# Patient Record
Sex: Male | Born: 1935 | Race: White | Hispanic: No | Marital: Married | Smoking: Former smoker
Health system: Southern US, Community
[De-identification: ages and names within clinical notes are randomized; demographics above are authoritative.]

## PROBLEM LIST (undated history)

## (undated) DIAGNOSIS — J42 Unspecified chronic bronchitis: Secondary | ICD-10-CM

## (undated) DIAGNOSIS — Z9989 Dependence on other enabling machines and devices: Secondary | ICD-10-CM

## (undated) DIAGNOSIS — E039 Hypothyroidism, unspecified: Secondary | ICD-10-CM

## (undated) DIAGNOSIS — I219 Acute myocardial infarction, unspecified: Secondary | ICD-10-CM

## (undated) DIAGNOSIS — R001 Bradycardia, unspecified: Secondary | ICD-10-CM

## (undated) DIAGNOSIS — I5032 Chronic diastolic (congestive) heart failure: Secondary | ICD-10-CM

## (undated) DIAGNOSIS — M1990 Unspecified osteoarthritis, unspecified site: Secondary | ICD-10-CM

## (undated) DIAGNOSIS — I10 Essential (primary) hypertension: Secondary | ICD-10-CM

## (undated) DIAGNOSIS — J189 Pneumonia, unspecified organism: Secondary | ICD-10-CM

## (undated) DIAGNOSIS — N200 Calculus of kidney: Secondary | ICD-10-CM

## (undated) DIAGNOSIS — J449 Chronic obstructive pulmonary disease, unspecified: Secondary | ICD-10-CM

## (undated) DIAGNOSIS — E119 Type 2 diabetes mellitus without complications: Secondary | ICD-10-CM

## (undated) DIAGNOSIS — E669 Obesity, unspecified: Secondary | ICD-10-CM

## (undated) DIAGNOSIS — I2510 Atherosclerotic heart disease of native coronary artery without angina pectoris: Secondary | ICD-10-CM

## (undated) DIAGNOSIS — K219 Gastro-esophageal reflux disease without esophagitis: Secondary | ICD-10-CM

## (undated) DIAGNOSIS — M109 Gout, unspecified: Secondary | ICD-10-CM

## (undated) DIAGNOSIS — G4733 Obstructive sleep apnea (adult) (pediatric): Secondary | ICD-10-CM

## (undated) DIAGNOSIS — N183 Chronic kidney disease, stage 3 unspecified: Secondary | ICD-10-CM

## (undated) DIAGNOSIS — I739 Peripheral vascular disease, unspecified: Secondary | ICD-10-CM

## (undated) DIAGNOSIS — E785 Hyperlipidemia, unspecified: Secondary | ICD-10-CM

## (undated) HISTORY — DX: Peripheral vascular disease, unspecified: I73.9

## (undated) HISTORY — PX: TONSILLECTOMY: SUR1361

## (undated) HISTORY — PX: CARDIAC CATHETERIZATION: SHX172

## (undated) HISTORY — PX: ANGIOPLASTY: SHX39

## (undated) HISTORY — PX: CORONARY ANGIOPLASTY WITH STENT PLACEMENT: SHX49

## (undated) HISTORY — PX: FRACTURE SURGERY: SHX138

## (undated) HISTORY — DX: Type 2 diabetes mellitus without complications: E11.9

## (undated) HISTORY — PX: OTHER SURGICAL HISTORY: SHX169

## (undated) HISTORY — PX: TIBIA FRACTURE SURGERY: SHX806

## (undated) HISTORY — PX: BACK SURGERY: SHX140

## (undated) HISTORY — PX: CHOLECYSTECTOMY: SHX55

## (undated) HISTORY — PX: CATARACT EXTRACTION W/ INTRAOCULAR LENS  IMPLANT, BILATERAL: SHX1307

## (undated) HISTORY — DX: Obesity, unspecified: E66.9

## (undated) HISTORY — PX: LITHOTRIPSY: SUR834

## (undated) HISTORY — DX: Acute myocardial infarction, unspecified: I21.9

---

## 1988-10-28 HISTORY — PX: KNEE ARTHROSCOPY: SUR90

## 1996-04-27 HISTORY — PX: CORONARY ARTERY BYPASS GRAFT: SHX141

## 1997-11-18 ENCOUNTER — Observation Stay (HOSPITAL_COMMUNITY): Admission: AD | Admit: 1997-11-18 | Discharge: 1997-11-19 | Payer: Self-pay | Admitting: Cardiovascular Disease

## 1998-02-27 HISTORY — PX: REPLANTATION FINGER: SUR1229

## 1998-05-01 ENCOUNTER — Inpatient Hospital Stay (HOSPITAL_COMMUNITY): Admission: EM | Admit: 1998-05-01 | Discharge: 1998-05-04 | Payer: Self-pay | Admitting: *Deleted

## 1998-12-16 ENCOUNTER — Emergency Department (HOSPITAL_COMMUNITY): Admission: EM | Admit: 1998-12-16 | Discharge: 1998-12-16 | Payer: Self-pay | Admitting: *Deleted

## 1998-12-20 ENCOUNTER — Inpatient Hospital Stay (HOSPITAL_COMMUNITY): Admission: AD | Admit: 1998-12-20 | Discharge: 1998-12-23 | Payer: Self-pay | Admitting: Cardiovascular Disease

## 1998-12-21 ENCOUNTER — Encounter: Payer: Self-pay | Admitting: Cardiovascular Disease

## 1999-09-01 ENCOUNTER — Encounter: Payer: Self-pay | Admitting: Urology

## 1999-09-01 ENCOUNTER — Encounter: Admission: RE | Admit: 1999-09-01 | Discharge: 1999-09-01 | Payer: Self-pay | Admitting: Urology

## 1999-09-27 ENCOUNTER — Encounter: Payer: Self-pay | Admitting: Urology

## 1999-09-29 ENCOUNTER — Ambulatory Visit (HOSPITAL_COMMUNITY): Admission: RE | Admit: 1999-09-29 | Discharge: 1999-09-29 | Payer: Self-pay | Admitting: Urology

## 1999-09-29 ENCOUNTER — Encounter: Payer: Self-pay | Admitting: Urology

## 1999-10-18 ENCOUNTER — Encounter: Admission: RE | Admit: 1999-10-18 | Discharge: 1999-10-18 | Payer: Self-pay | Admitting: Urology

## 1999-10-18 ENCOUNTER — Encounter: Payer: Self-pay | Admitting: Urology

## 1999-10-19 ENCOUNTER — Encounter: Payer: Self-pay | Admitting: Urology

## 1999-10-19 ENCOUNTER — Encounter: Admission: RE | Admit: 1999-10-19 | Discharge: 1999-10-19 | Payer: Self-pay | Admitting: Urology

## 1999-11-09 ENCOUNTER — Encounter: Admission: RE | Admit: 1999-11-09 | Discharge: 1999-11-09 | Payer: Self-pay | Admitting: Urology

## 1999-11-09 ENCOUNTER — Encounter: Payer: Self-pay | Admitting: Urology

## 2000-02-15 ENCOUNTER — Encounter: Payer: Self-pay | Admitting: Urology

## 2000-02-15 ENCOUNTER — Encounter: Admission: RE | Admit: 2000-02-15 | Discharge: 2000-02-15 | Payer: Self-pay | Admitting: Urology

## 2002-04-04 ENCOUNTER — Encounter: Payer: Self-pay | Admitting: Emergency Medicine

## 2002-04-04 ENCOUNTER — Emergency Department (HOSPITAL_COMMUNITY): Admission: EM | Admit: 2002-04-04 | Discharge: 2002-04-05 | Payer: Self-pay | Admitting: Emergency Medicine

## 2002-05-02 ENCOUNTER — Ambulatory Visit (HOSPITAL_COMMUNITY): Admission: RE | Admit: 2002-05-02 | Discharge: 2002-05-02 | Payer: Self-pay | Admitting: Ophthalmology

## 2002-05-28 ENCOUNTER — Inpatient Hospital Stay (HOSPITAL_COMMUNITY): Admission: AD | Admit: 2002-05-28 | Discharge: 2002-06-05 | Payer: Self-pay | Admitting: Cardiovascular Disease

## 2003-07-03 ENCOUNTER — Emergency Department (HOSPITAL_COMMUNITY): Admission: EM | Admit: 2003-07-03 | Discharge: 2003-07-03 | Payer: Self-pay | Admitting: Emergency Medicine

## 2003-10-08 ENCOUNTER — Ambulatory Visit (HOSPITAL_COMMUNITY): Admission: RE | Admit: 2003-10-08 | Discharge: 2003-10-08 | Payer: Self-pay | Admitting: Pulmonary Disease

## 2003-10-27 ENCOUNTER — Inpatient Hospital Stay (HOSPITAL_COMMUNITY): Admission: RE | Admit: 2003-10-27 | Discharge: 2003-11-03 | Payer: Self-pay | Admitting: Thoracic Surgery

## 2003-10-27 ENCOUNTER — Encounter (INDEPENDENT_AMBULATORY_CARE_PROVIDER_SITE_OTHER): Payer: Self-pay | Admitting: *Deleted

## 2003-11-18 ENCOUNTER — Encounter: Admission: RE | Admit: 2003-11-18 | Discharge: 2003-11-18 | Payer: Self-pay | Admitting: Thoracic Surgery

## 2003-12-09 ENCOUNTER — Encounter: Admission: RE | Admit: 2003-12-09 | Discharge: 2003-12-09 | Payer: Self-pay | Admitting: Thoracic Surgery

## 2004-01-10 ENCOUNTER — Emergency Department (HOSPITAL_COMMUNITY): Admission: EM | Admit: 2004-01-10 | Discharge: 2004-01-10 | Payer: Self-pay

## 2004-01-26 ENCOUNTER — Encounter: Admission: RE | Admit: 2004-01-26 | Discharge: 2004-01-26 | Payer: Self-pay | Admitting: Thoracic Surgery

## 2004-03-04 ENCOUNTER — Emergency Department (HOSPITAL_COMMUNITY): Admission: EM | Admit: 2004-03-04 | Discharge: 2004-03-04 | Payer: Self-pay | Admitting: Emergency Medicine

## 2004-03-06 ENCOUNTER — Emergency Department (HOSPITAL_COMMUNITY): Admission: EM | Admit: 2004-03-06 | Discharge: 2004-03-06 | Payer: Self-pay | Admitting: Emergency Medicine

## 2004-03-07 ENCOUNTER — Ambulatory Visit (HOSPITAL_COMMUNITY): Admission: RE | Admit: 2004-03-07 | Discharge: 2004-03-07 | Payer: Self-pay | Admitting: Pulmonary Disease

## 2004-04-05 ENCOUNTER — Ambulatory Visit (HOSPITAL_COMMUNITY): Admission: RE | Admit: 2004-04-05 | Discharge: 2004-04-05 | Payer: Self-pay | Admitting: Pulmonary Disease

## 2004-04-18 ENCOUNTER — Ambulatory Visit (HOSPITAL_COMMUNITY): Admission: RE | Admit: 2004-04-18 | Discharge: 2004-04-18 | Payer: Self-pay | Admitting: Pulmonary Disease

## 2004-04-27 ENCOUNTER — Encounter: Admission: RE | Admit: 2004-04-27 | Discharge: 2004-04-27 | Payer: Self-pay | Admitting: Thoracic Surgery

## 2004-05-16 ENCOUNTER — Inpatient Hospital Stay (HOSPITAL_COMMUNITY): Admission: EM | Admit: 2004-05-16 | Discharge: 2004-05-19 | Payer: Self-pay | Admitting: *Deleted

## 2004-06-13 ENCOUNTER — Ambulatory Visit (HOSPITAL_COMMUNITY): Admission: RE | Admit: 2004-06-13 | Discharge: 2004-06-13 | Payer: Self-pay

## 2004-06-23 ENCOUNTER — Inpatient Hospital Stay (HOSPITAL_COMMUNITY): Admission: EM | Admit: 2004-06-23 | Discharge: 2004-06-25 | Payer: Self-pay | Admitting: Emergency Medicine

## 2004-07-15 ENCOUNTER — Ambulatory Visit (HOSPITAL_COMMUNITY): Admission: RE | Admit: 2004-07-15 | Discharge: 2004-07-15 | Payer: Self-pay | Admitting: Pulmonary Disease

## 2004-08-09 ENCOUNTER — Ambulatory Visit: Payer: Self-pay | Admitting: Internal Medicine

## 2004-08-09 ENCOUNTER — Ambulatory Visit (HOSPITAL_COMMUNITY): Admission: RE | Admit: 2004-08-09 | Discharge: 2004-08-09 | Payer: Self-pay | Admitting: Internal Medicine

## 2004-08-09 HISTORY — PX: COLONOSCOPY: SHX174

## 2004-09-16 ENCOUNTER — Ambulatory Visit (HOSPITAL_COMMUNITY): Admission: RE | Admit: 2004-09-16 | Discharge: 2004-09-16 | Payer: Self-pay | Admitting: Pulmonary Disease

## 2004-09-30 ENCOUNTER — Observation Stay (HOSPITAL_COMMUNITY): Admission: RE | Admit: 2004-09-30 | Discharge: 2004-10-01 | Payer: Self-pay | Admitting: General Surgery

## 2004-09-30 ENCOUNTER — Encounter (INDEPENDENT_AMBULATORY_CARE_PROVIDER_SITE_OTHER): Payer: Self-pay | Admitting: General Surgery

## 2005-05-10 ENCOUNTER — Ambulatory Visit (HOSPITAL_COMMUNITY): Admission: RE | Admit: 2005-05-10 | Discharge: 2005-05-10 | Payer: Self-pay | Admitting: Pulmonary Disease

## 2006-05-07 ENCOUNTER — Ambulatory Visit (HOSPITAL_COMMUNITY): Admission: RE | Admit: 2006-05-07 | Discharge: 2006-05-07 | Payer: Self-pay | Admitting: *Deleted

## 2006-05-29 ENCOUNTER — Ambulatory Visit (HOSPITAL_COMMUNITY): Admission: RE | Admit: 2006-05-29 | Discharge: 2006-05-30 | Payer: Self-pay | Admitting: Cardiovascular Disease

## 2006-11-19 ENCOUNTER — Ambulatory Visit (HOSPITAL_COMMUNITY): Admission: RE | Admit: 2006-11-19 | Discharge: 2006-11-19 | Payer: Self-pay | Admitting: Pulmonary Disease

## 2007-03-22 ENCOUNTER — Encounter: Admission: RE | Admit: 2007-03-22 | Discharge: 2007-03-22 | Payer: Self-pay | Admitting: Surgery

## 2007-04-11 ENCOUNTER — Inpatient Hospital Stay (HOSPITAL_COMMUNITY): Admission: EM | Admit: 2007-04-11 | Discharge: 2007-04-13 | Payer: Self-pay | Admitting: Emergency Medicine

## 2007-06-06 ENCOUNTER — Ambulatory Visit (HOSPITAL_COMMUNITY): Admission: RE | Admit: 2007-06-06 | Discharge: 2007-06-06 | Payer: Self-pay | Admitting: Pulmonary Disease

## 2007-06-07 ENCOUNTER — Inpatient Hospital Stay (HOSPITAL_COMMUNITY): Admission: EM | Admit: 2007-06-07 | Discharge: 2007-06-19 | Payer: Self-pay | Admitting: Emergency Medicine

## 2007-06-10 ENCOUNTER — Ambulatory Visit: Payer: Self-pay | Admitting: Vascular Surgery

## 2007-06-10 ENCOUNTER — Encounter (INDEPENDENT_AMBULATORY_CARE_PROVIDER_SITE_OTHER): Payer: Self-pay | Admitting: *Deleted

## 2007-06-12 ENCOUNTER — Encounter: Payer: Self-pay | Admitting: *Deleted

## 2008-11-30 ENCOUNTER — Ambulatory Visit (HOSPITAL_COMMUNITY): Admission: RE | Admit: 2008-11-30 | Discharge: 2008-11-30 | Payer: Self-pay | Admitting: Pulmonary Disease

## 2009-01-22 ENCOUNTER — Emergency Department (HOSPITAL_COMMUNITY): Admission: EM | Admit: 2009-01-22 | Discharge: 2009-01-22 | Payer: Self-pay | Admitting: Emergency Medicine

## 2009-02-12 IMAGING — CR DG CHEST 2V
2 series · 2 of 2 positions shown · non-contrast
Comparison: 06/10/2007 study

CLINICAL DATA: History of congestive heart failure.  Dyspnea.
Coughing.

CHEST - 2 VIEW

[w chest pa]
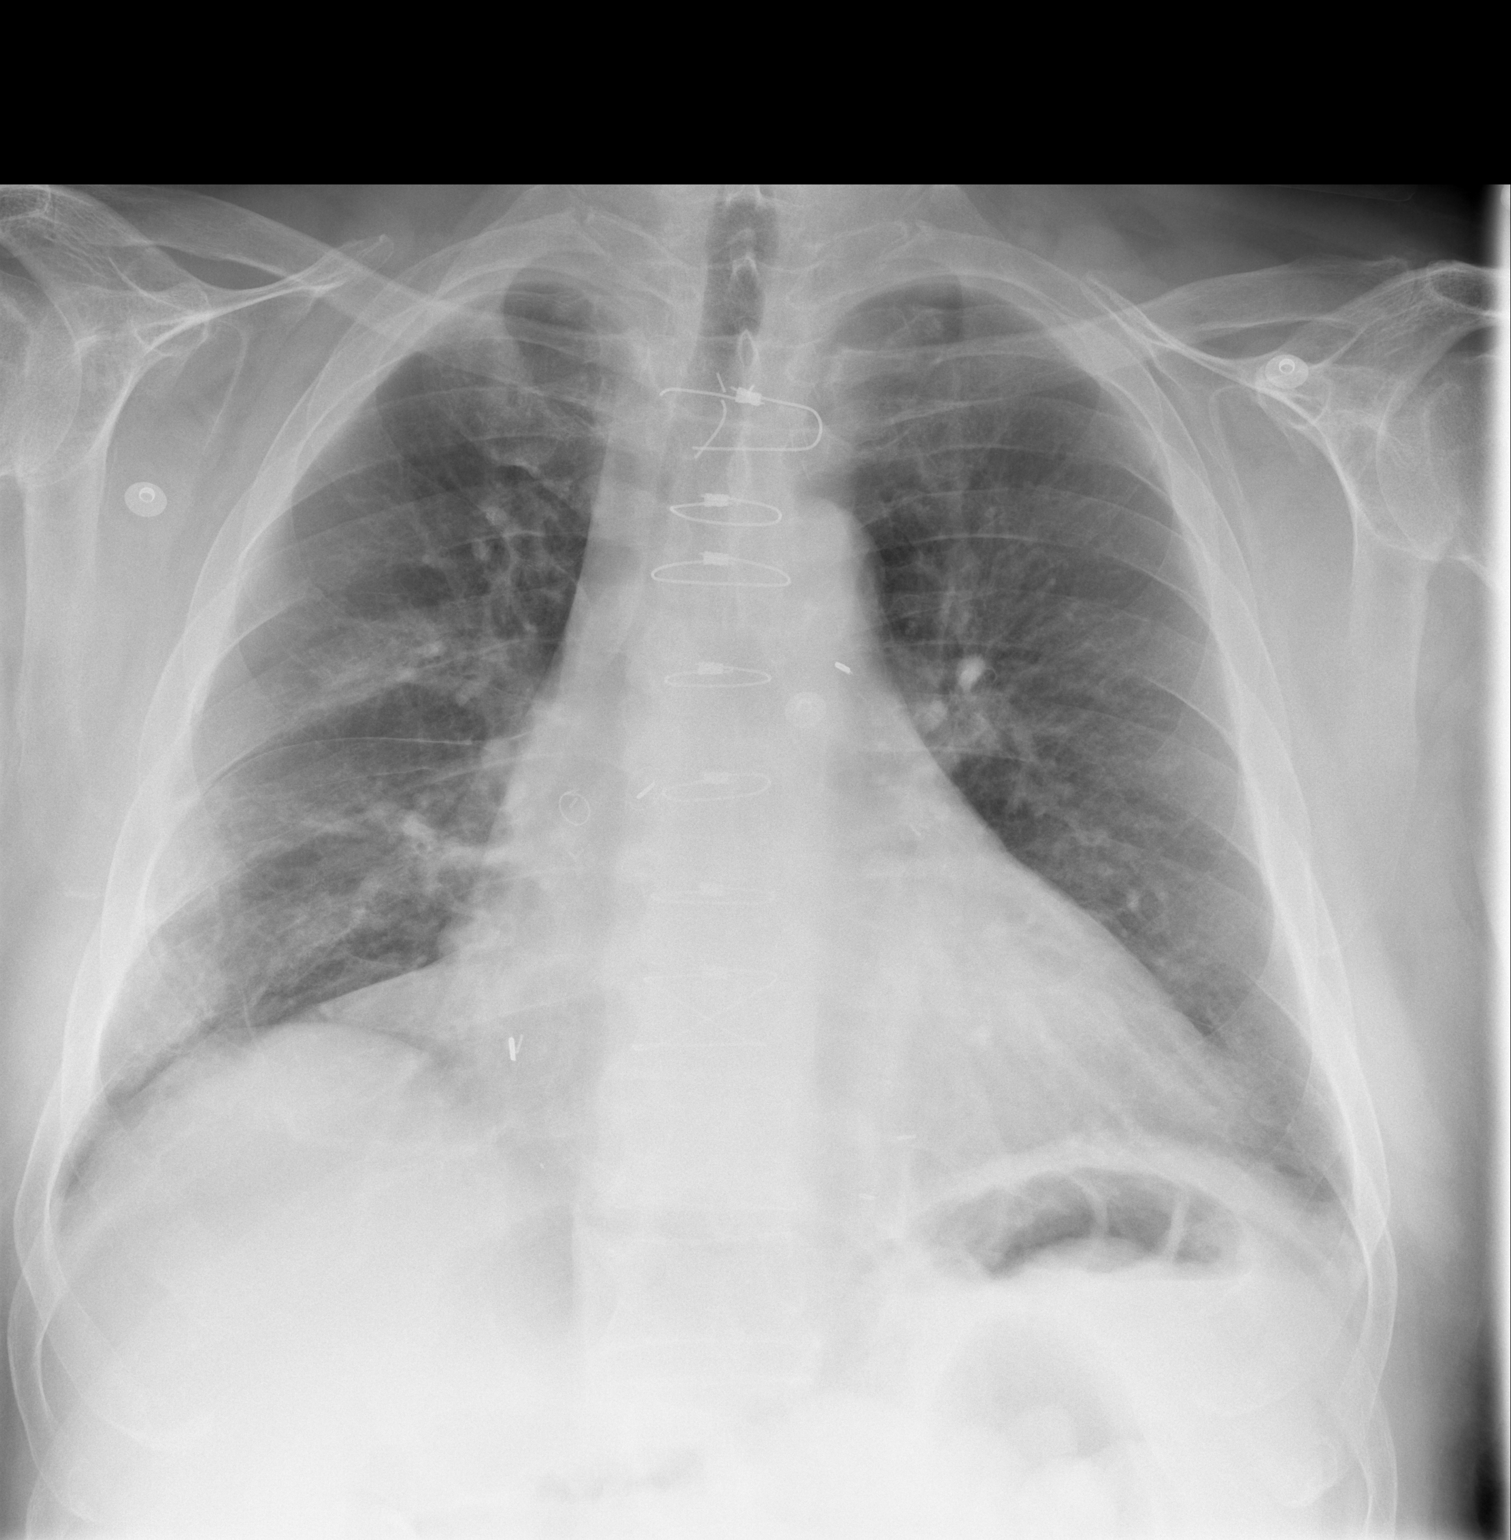

[w chest lat]
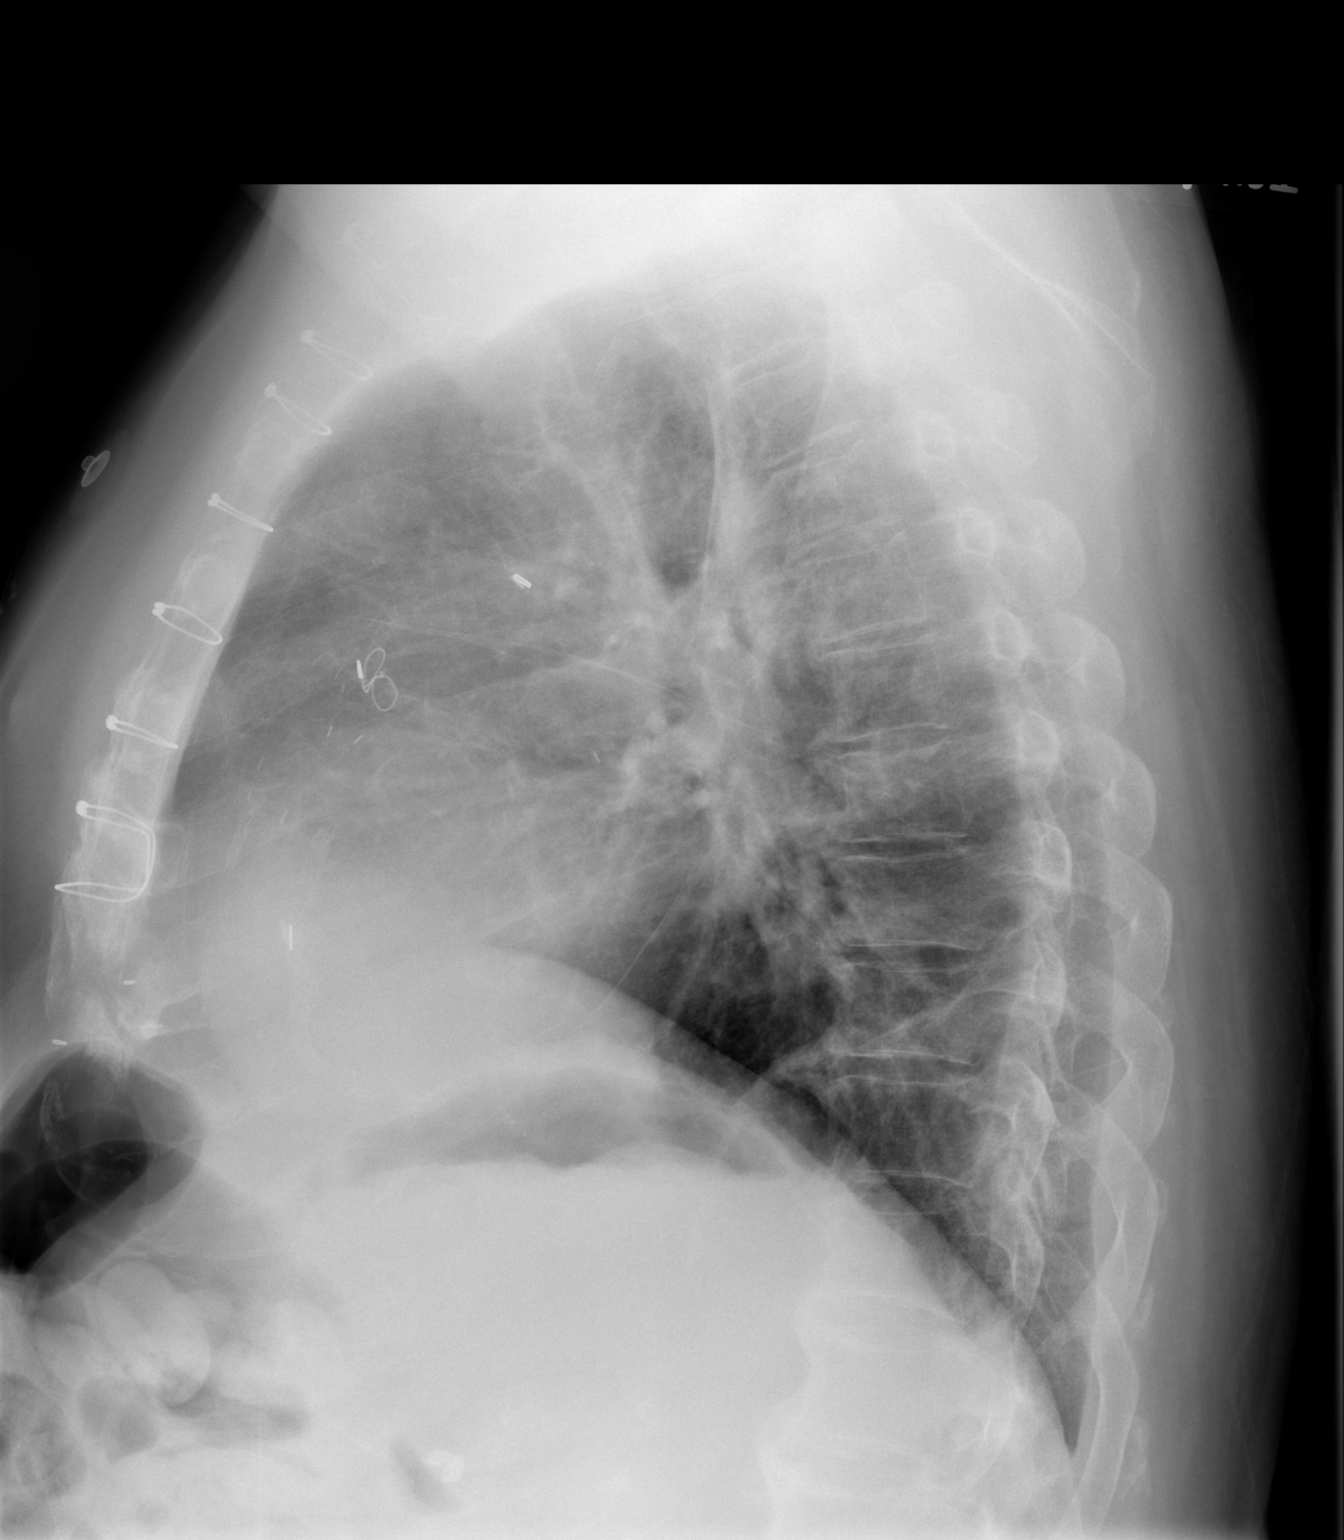

[2 of 2 positions shown; findings below may reference images not displayed]

FINDINGS: Previous median sternotomy coronary artery bypass
grafting has been performed.  There is stable moderate enlargement
of the cardiac silhouette.  No pleural effusions are seen.  The
left lung is free of infiltrates.  Patchy infiltrative density
within the right midlung and right perihilar region appears
unchanged from previous studies.  These opacities have been present
at least since the study of 04/11/2007 and are felt to represent
probable fibrosis at this time.  No new lesion is evident.  There
is degenerative spondylosis average for age.
IMPRESSION: Stable moderate enlargement of the cardiac silhouette with no
pulmonary edema or pleural effusion.  Hazy opacities in the right
hemi thorax are stable over multiple examinations and most likely
reflect fibrosis. No acute superimposed process is seen.

## 2009-02-14 IMAGING — CR DG CERVICAL SPINE 2 OR 3 VIEWS
4 series · 4 of 4 positions shown · non-contrast
Comparison: 05/07/2006

CLINICAL DATA: Chronic neck pain.

CERVICAL SPINE - 2-3 VIEW

[view not recorded (1 of 4)]
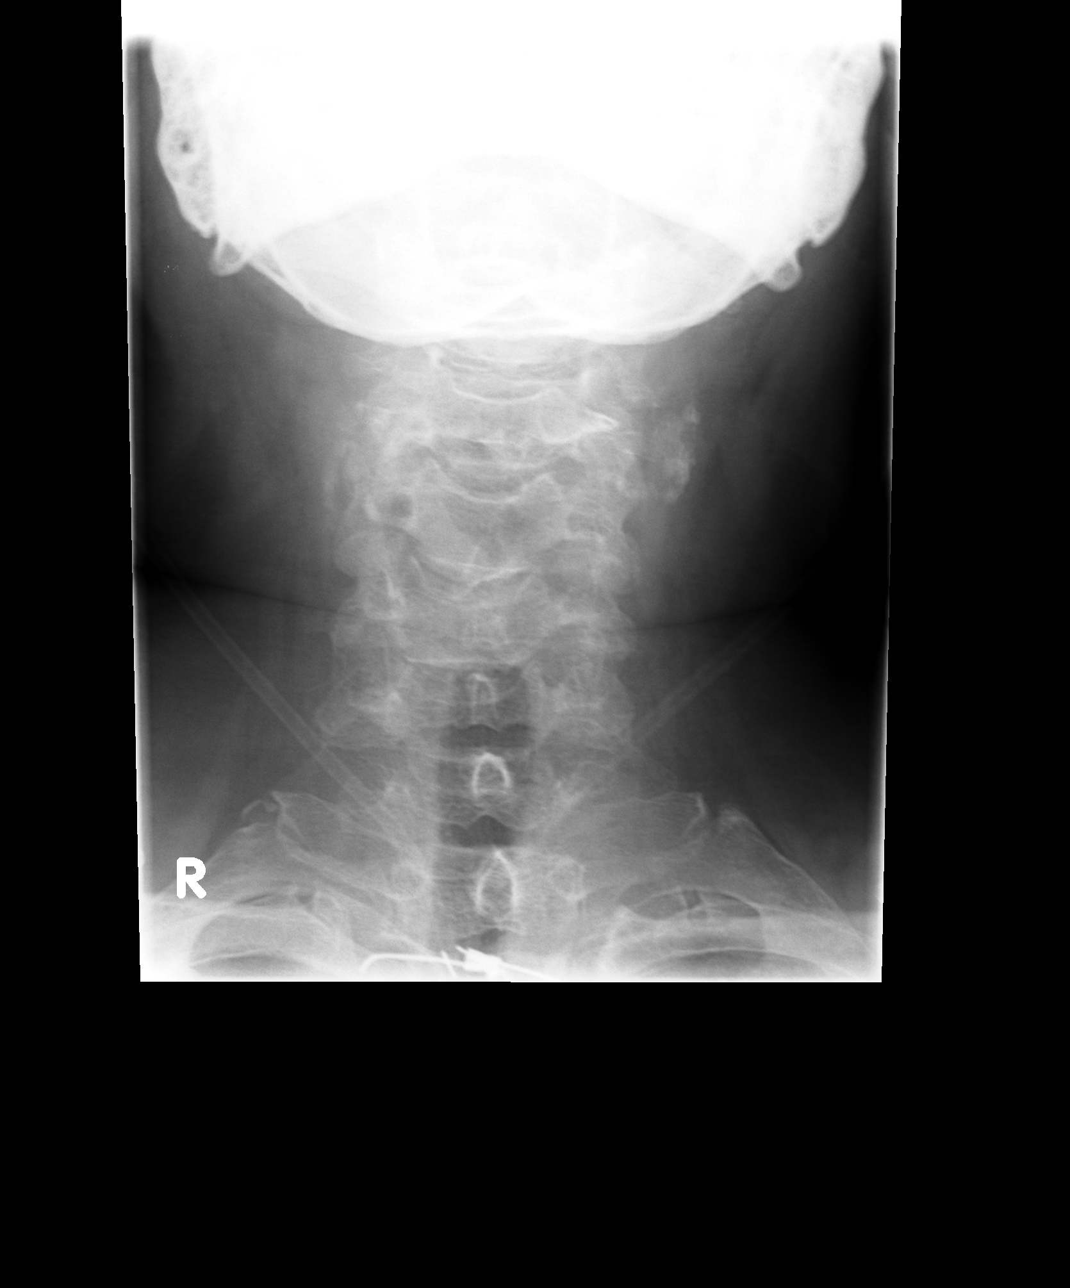

[view not recorded (2 of 4)]
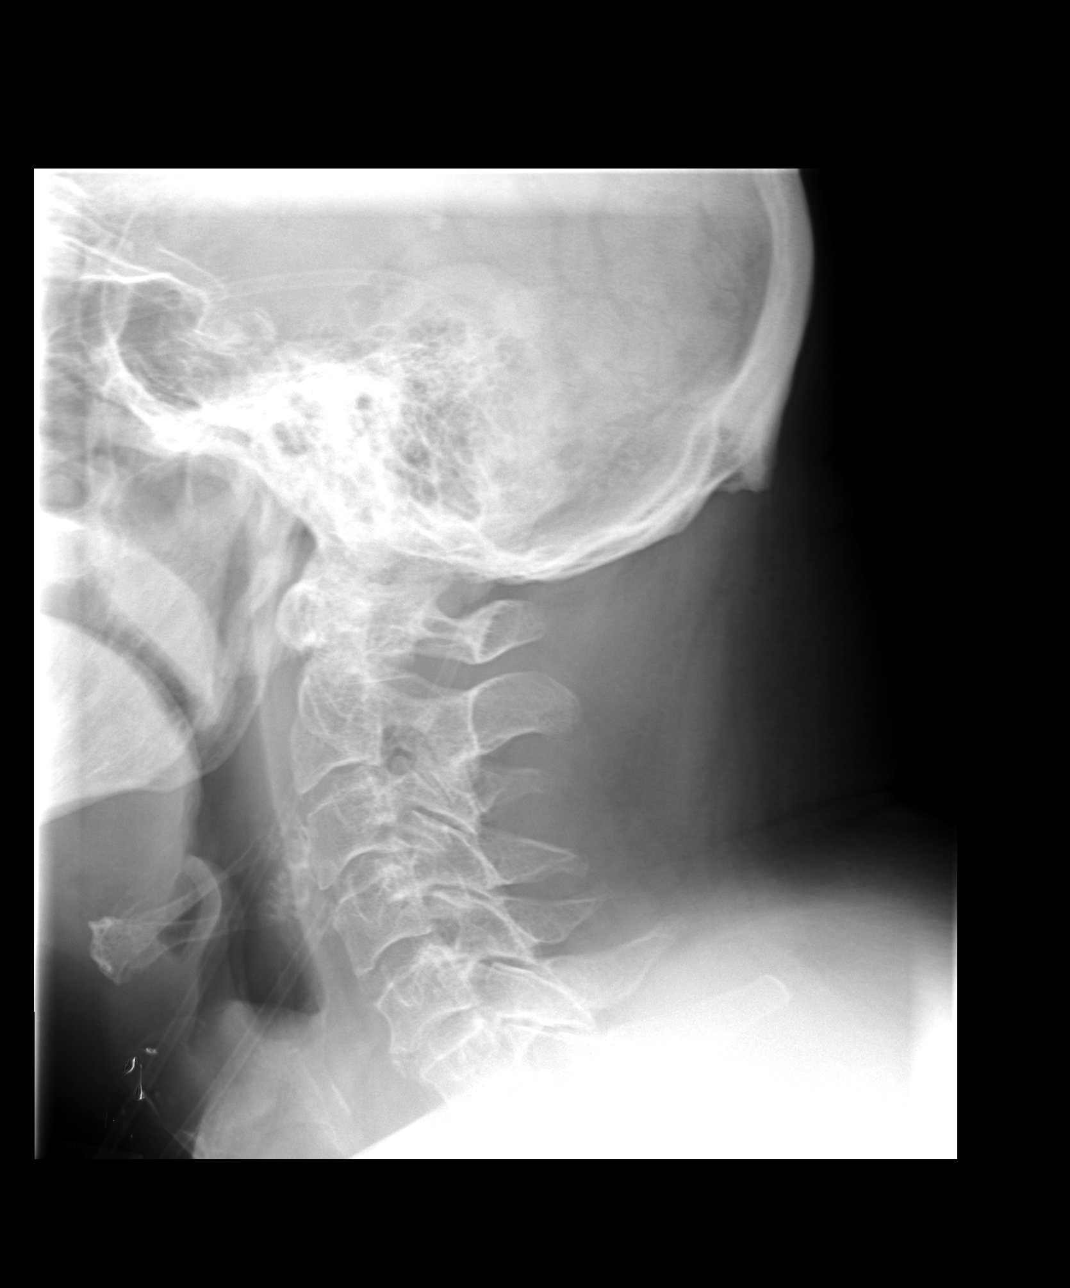

[view not recorded (3 of 4)]
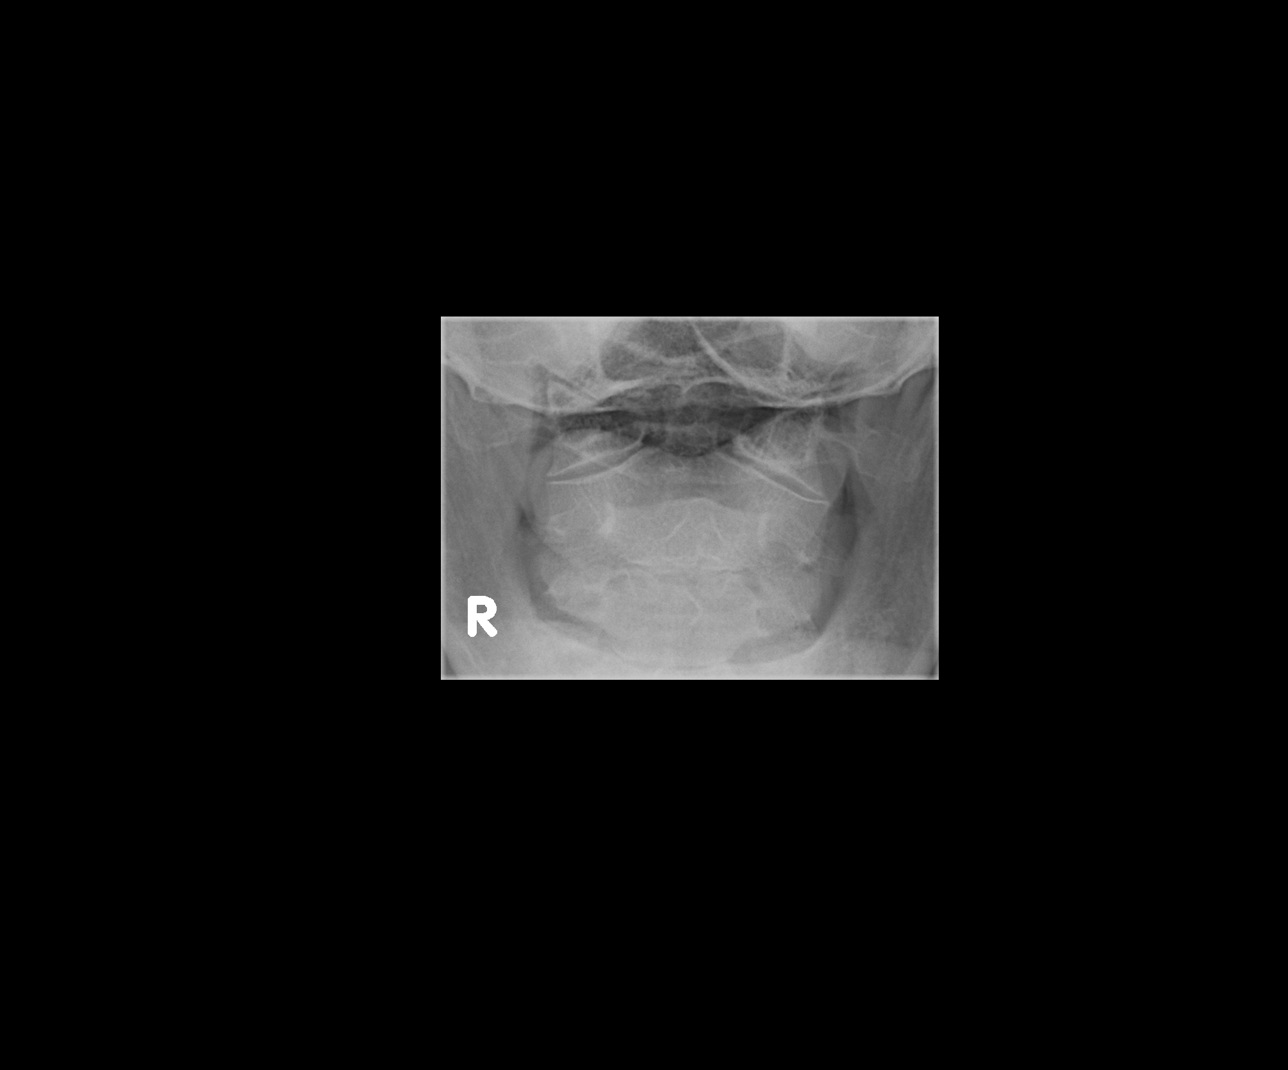

[view not recorded (4 of 4)]
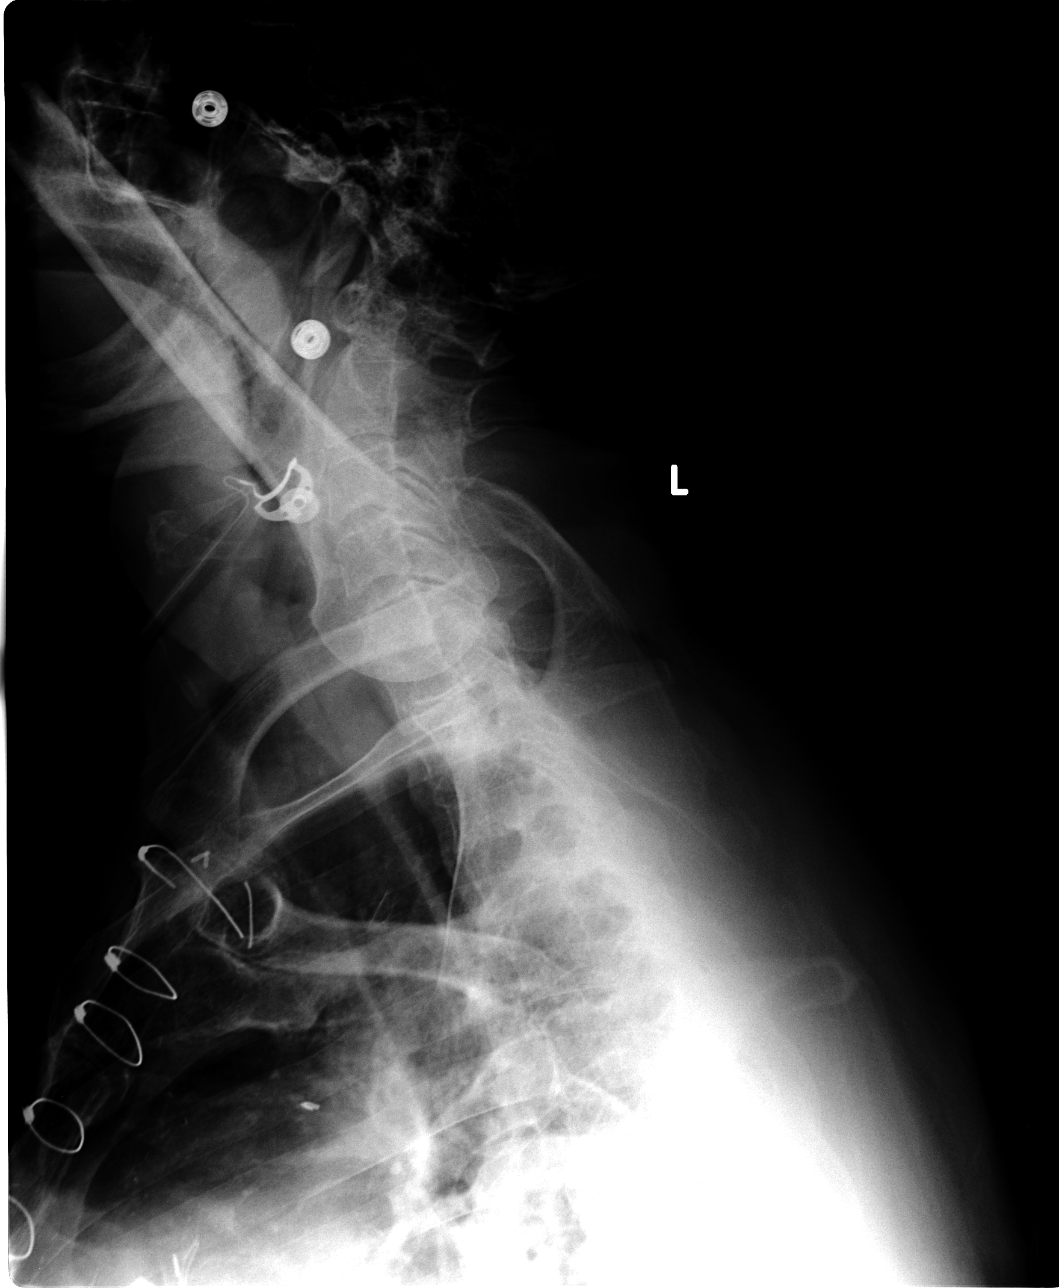

[4 of 4 positions shown; findings below may reference images not displayed]

FINDINGS: C1-T1 are normal in alignment and position.  There is no
significant disc space narrowing.  There is some slight anterior
spurring at the C5-6 level and there is some mild degenerative
facet joint arthritis at C2-3 and C3-4.  There is fairly extensive
calcification in the carotid bifurcations bilaterally.
IMPRESSION: Mild degenerative facet joint arthritis in the cervical spine.
Carotid bifurcation calcifications.

## 2009-02-18 ENCOUNTER — Encounter: Admission: RE | Admit: 2009-02-18 | Discharge: 2009-02-18 | Payer: Self-pay | Admitting: Family Medicine

## 2010-03-20 ENCOUNTER — Encounter: Payer: Self-pay | Admitting: Family Medicine

## 2010-06-01 LAB — DIFFERENTIAL
Basophils Relative: 0 % (ref 0–1)
Eosinophils Absolute: 0.2 10*3/uL (ref 0.0–0.7)
Eosinophils Relative: 1 % (ref 0–5)
Lymphs Abs: 1.1 10*3/uL (ref 0.7–4.0)

## 2010-06-01 LAB — URINALYSIS, ROUTINE W REFLEX MICROSCOPIC
Bilirubin Urine: NEGATIVE
Ketones, ur: NEGATIVE mg/dL
Nitrite: NEGATIVE
Urobilinogen, UA: 0.2 mg/dL (ref 0.0–1.0)

## 2010-06-01 LAB — CBC
HCT: 40.2 % (ref 39.0–52.0)
MCHC: 34.5 g/dL (ref 30.0–36.0)
MCV: 102.7 fL — ABNORMAL HIGH (ref 78.0–100.0)
Platelets: 244 10*3/uL (ref 150–400)
RDW: 13.6 % (ref 11.5–15.5)
WBC: 14.4 10*3/uL — ABNORMAL HIGH (ref 4.0–10.5)

## 2010-06-01 LAB — BASIC METABOLIC PANEL
BUN: 21 mg/dL (ref 6–23)
CO2: 26 mEq/L (ref 19–32)
Chloride: 101 mEq/L (ref 96–112)
Creatinine, Ser: 1.71 mg/dL — ABNORMAL HIGH (ref 0.4–1.5)
Glucose, Bld: 264 mg/dL — ABNORMAL HIGH (ref 70–99)
Potassium: 4.3 mEq/L (ref 3.5–5.1)

## 2010-07-12 NOTE — H&P (Signed)
NAMECOSBY, PROBY               ACCOUNT NO.:  [id number]   MEDICAL RECORD NO.:  [id number]          PATIENT TYPE:  INP   LOCATION:  A218                          FACILITY:  [facility]   PHYSICIAN:  Edward L. Juanetta Gosling, M.D.DATE OF BIRTH:  [birth year]   DATE OF ADMISSION:  06/07/2007  DATE OF DISCHARGE:  LH                              HISTORY & PHYSICAL   Mr. Holtzer is a 77 year old who came to the emergency room  because of  shortness of breath.  He was seen in my office yesterday with confusion.  He has been having this for about 2 weeks and seemed to worsen at night.  When I saw him in my office, his oxygen saturation was in the mid 90s on  room air.  I had him get a chest x-ray that was negative for acute  infiltrate.  I had him do a CT of the brain which is negative and he had  a lot of lab work that had not returned.  He was better, but then last  night developed increasing shortness of breath and cough.  He says he  became so short of breath he felt like he was gasping for breath.  He  took nitroglycerin because he has known coronary artery occlusive  disease and he did not get much relief.  He called EMS who brought into  the hospital room where he was found to have what looks like congestive  heart failure by chest x-ray and BNP criteria.  He has been treated  since and says he feels better.  He actually did not have as much  confusion last night.  He did not have significant chest pain, but he  did have the cough and congestion.   PAST MEDICAL HISTORY:  1. Coronary artery occlusive disease.  2. Congestive heart failure which has been pretty stable.  3. Hypertension.  4. Diabetes.  5. Chronic low back pain.  6. Hyperlipidemia.  7. Hypothyroidism.  8. Multiple areas of peripheral vascular disease.   PAST SURGICAL HISTORY:  1. He has had back surgery.  2. Coronary artery bypass grafting, has had stents.  3. He has had a cholecystectomy.   SOCIAL HISTORY:  He has a remote  smoking history.  Does not use any  alcohol.  Lives at home with his family.   FAMILY HISTORY:  Positive for cardiac disease.  In fact he had a son who  died of a massive myocardial infarction.   ALLERGIES:  He is allergic to DEMEROL.   CURRENT MEDICATIONS:  1. Aspirin 81 mg daily.  2. Furosemide 40 mg daily.  3. Omeprazole 20 mg b.i.d.  4. Allopurinol 1/2 tablet daily.  5. Levothyroxine 150 mcg daily.  6. Metoprolol 25 mg b.i.d.  7. Plavix 75 mg daily.  8. Isosorbide mononitrate 30 mg twice a day.  9. Amlodipine 10 mg daily.  10.Glipizide 10 mg b.i.d.  11.Avandia 8 mg daily.  12.Zocor 80 mg one half daily.  13.Zetia 10 mg daily.  14.Januvia 100 mg 1/2 tablet daily.  15.Renexa 500 mg  b.i.d.   He also has  a history of mild chronic renal failure which I forgot to  mention in his history of present illness.  He also has sleep apnea but  he does not use CPAP most of the time.   PHYSICAL EXAMINATION:  GENERAL:  Obese male who does not appear to be in  any acute distress now.  He has a non-rebreather mask an his oxygen  saturation is 100%.  VITAL SIGNS:  His temperature is 98, pulse 71, respirations 22, blood  pressure 151/80, oxygen saturation 100%.  Blood sugar 174.  HEENT:  His  pupils are reactive.  His nose and throat are clear.  NECK:  Supple.  He does not have jugular venous distention.  CARDIAC:  Now his heart is regular without murmur, gallop or rub.  ABDOMEN:  Soft.  No masses felt.  Bowel sounds present and active.  EXTREMITIES:  Trace edema bilaterally.  CENTRAL NERVOUS SYSTEM:  Examination currently is intact.   LUNGS:  Despite the fact that he was initially had CHF, I did not hear  any rales now.   LABORATORY DATA:  Electrolytes show BUN is 26, creatinine 1.55.  BNP was  548.  Cardiac markers negative for infarction.  His chest x-ray was  consistent with CHF.  White count 10,700, hemoglobin 10.4, platelets  327.   ASSESSMENT:  He has had confusion.  I am not  certain that the congestive  heart failure is the cause of his confusion.  He does have congestive  heart failure now.  He has multiple other medical abnormalities  including diabetes, chronic renal failure and clearly he is going to  have to come off of the Avandia.  He is going to be on sliding scale.  I  am going to ask for cardiology consultation.  He was hospitalized in  February of this year with another bout of what appeared to be acute on  chronic diastolic congestive heart failure and chest pain and was  admitted to [facility] at that time.  I am not sure if he is becoming  hypoxic and developing problems with his confusion or exactly what  happened, but he did not have congestive heart failure on his chest x-  ray that was done yesterday..  Continue most of his other medications.  Have him on intravenous diuretics, sliding scale insulin and follow.      Edward L. Juanetta Gosling, M.D.  Electronically Signed     ELH/MEDQ  D:  06/07/2007  T:  06/07/2007  Job:  [id number]

## 2010-07-12 NOTE — Discharge Summary (Signed)
NAMESHONTE, [name]               ACCOUNT NO.:  [id number]   MEDICAL RECORD NO.:  [id number]          PATIENT TYPE:   LOCATION:                                 FACILITY:   PHYSICIAN:  Nanetta Batty, M.D.   DATE OF BIRTH:  12-09-1933   DATE OF ADMISSION:  04/11/2007  DATE OF DISCHARGE:  04/13/2007                               DISCHARGE SUMMARY   DISCHARGE DIAGNOSES:  1. Chest pain, unstable angina, resolved.      a.     Negative for myocardial infarction.  2. Coronary artery disease with history of bypass grafting and disease      beyond the insertion points of the graft.  3. Palpitations.  4. Acute on chronic diastolic heart failure, resolved.  5. Increase in renal insufficiency secondary to medications.  6. Diabetes mellitus type 2, controlled.  7. Hypertension.  8. Hypothyroidism.  9. Dyslipidemia.  10.Obstructive sleep apnea, noncompliant with continuous positive      airway pressure.  11.Obesity.   DISCHARGE CONDITION:  Improved.   PROCEDURES:  None.   DISCHARGE MEDICATIONS:  1. Aspirin 81 mg daily.  2. Furosemide 40 mg daily.  3. Sulfamethoxazole/TMP, one twice a day until complete.  4. May continue with sleeping pill as before.  5. Omeprazole 20 mg twice a day.  6. Allopurinol 300 mg, 1/2 tab daily.  7. Levothyroxine 0.15 mg daily.  8. Metoprolol, i.e. Lopressor, 25 mg twice a day.  9. Plavix 75 mg daily.  10.Isosorbide mononitrate 30 mg twice a day.  11.Amlodipine 10 mg daily.  12.Glipizide 10 mg twice a day.  13.Avandia 8 mg daily.  14.Zocor 80 mg, 1/2 tab daily.  15.Zetia 10 mg daily.  16.Januvia 100 mg, 1/2 tab daily.  17.Do not take any more anti-inflammatory.  18.Continue Ranexa 500 mg twice a day as before.   DISCHARGE INSTRUCTIONS:  1. No restrictions on activity but increase slowly.  2. Low sodium, heart-healthy, diabetic diet.  3. Followup with [name], the office will call with date and time.  4. Follow up with [facility] My[facility] in Dr.  Hazle Coca office, the office      will call you with date and time.   HISTORY OF PRESENT ILLNESS:  This 77 year old white married male with  history of coronary disease and bypass grafting presented to the  emergency room April 10, 2007, with complaints of palpitations and  chest pain.  He had eaten dinner and sat down to read paper, his heart  began to race and then he expressed associated midsternal chest pain and  radiation to the neck and left arm.  Denies any increasing shortness of  breath.  No diaphoresis, nausea or vomiting.  No syncope or presyncope.  He took 3 nitroglycerin, his heart rate slowed and chest pain eventually  abated.  He had been doing well on his Ranexa previously.  Patient had  denied any dyspnea on exertion, no fever or chills, no indigestion, no  choking on medications or food.  No cough or weakness.  He continued  with palpitations which were PVCs and bigeminy on monitor.  PAST MEDICAL HISTORY:  Last cath was in April of 2008.  He grafts were  patent but he did have 90% stenosis beyond the insertion site.  He has  also had a stent to the native RCA, 6 tandem stents were then placed.  His bypass grafting was x5 in 1998.  His EF on the last cath was 55%.  He also has a stent to the left vertebral artery by Dr. Lamar Sprinkles in  [place], [place].   Other History:  1. Dyslipidemia.  2. Diabetes.  3. Hypertension.  4. Hypothyroidism.  5. Gout.  6. Testicular cyst and recently was given nonsteroidals to take as      well as antibiotics.  7. Obstructive sleep apnea but does not wear CPAP.  8. He has had back surgery.   FAMILY HISTORY/SOCIAL HISTORY/REVIEW OF SYSTEMS:  See H and P.   ALLERGY:  DEMEROL.   PHYSICAL EXAM PRIOR TO DISCHARGE:  VITAL SIGNS:  Stable at 137/56, pulse  56, he had some nonconducted PACs on the telemetry that looked like  pauses, respirations 16, temp 97.8, oxygen saturation 2L 96%.  HEART:  Regular rate and rhythm.  LUNGS:   Clear.  ABDOMEN:  Soft.  EXTREMITIES:  Without edema.   LABORATORY DATA:  He is to be discharged on the 14th, I am dictating  this on the 13th, so I do not have his last days of labs, but otherwise  hemoglobin initially 11.6 and hematocrit 34.9.  At discharge, hemoglobin  12.2, hematocrit 35.8, WBC 5.7, platelets 299,000, MCV 103.  Chemistries:  Sodium 137, potassium 4.4, chloride 103, CO2 26, BUN 22.  Please note on admission creatinine was 2.6, normally it is 1.  He was  given fluids.  On the 13th his creatinine is 2.01 and his glucose is  112, BUN 22.  Coags:  Prothrombin time 13, INR of 1, PTT of 37.  On  heparin he was 0.65 level.  LFTs were normal, AST 20, ALT 14, alkaline  phosphatase 55, total bili 0.9, albumin 4.1.  Cardiac enzymes:  CK  ranged 116, 92 to 101, MBs were all negative at 2.4 to 2.1, troponin I  0.04 to 0.03.  Cholesterol 153, LDL 78, HDL 32, triglycerides 217,  calcium 9, magnesium 2.4, glyco hemoglobin was 6.6, TSH was 0.879 and  BNP on admission was 374.  UA was essentially clear.  He did have 100  mg/dl of protein.  Chest x-ray initially, on the portable, patchy  increased bibasilar opacity could reflect basilar or predominant edema,  aspiration or infection and the followup after 40 of IV Lasix revealed  improved aeration of the lower lobes bilaterally, return to baseline.  Chest x-ray with right lower lobe scarring.  Some of this could have  been related to a portable chest in this large man vs. probably some  mild diastolic heart failure as well, especially with his renal  insufficiency that was new and increased.   Electrocardiograms:  Sinus rhythm, sinus arrhythmia, first degree A-V  block.  He has had PVCs frequently on the monitor despite normal  electrolytes.   HOSPITAL COURSE:  The patient was admitted on April 11, 2007, with  chest pain and palpitations, actually tachycardia which initiated the  whole process.  He was placed on IV heparin and  nitroglycerin, admitted  to telemetry and had no further complaints.  He was gently hydrated 50  mL an hour the whole hospitalization secondary to the acute renal  insufficiency that may have  set off some of these issues and we stopped  his nonsteroidal.  Patient continued to improve and his IV heparin was  discontinued, he was put on Lovenox DVT on February 13, his  nitroglycerin was discontinued, he was restarted on his regular  medications and hopefully on February 14, if all is stable, he will be  able to be discharged home.  He will be scheduled as an outpatient  Myoview.      Darcella Gasman. Annie Paras, N.P.      Nanetta Batty, M.D.  Electronically Signed    LRI/MEDQ  D:  04/12/2007  T:  04/14/2007  Job:  [id number]   cc:   Nanetta Batty, M.D.  Edward L. Juanetta Gosling, M.D.

## 2010-07-12 NOTE — Discharge Summary (Signed)
[name], [name]               ACCOUNT NO.:  [id number]   MEDICAL RECORD NO.:  [id number]          PATIENT TYPE:  INP   LOCATION:  4702                         FACILITY:  [facility]   PHYSICIAN:  [name], P.A.   DATE OF BIRTH:  [birth year]   DATE OF ADMISSION:  06/07/2007  DATE OF DISCHARGE:  06/19/2007                               DISCHARGE SUMMARY   DISCHARGE DIAGNOSES:  1. Acute on chronic diastolic heart failure, improved at discharge.  2. Bronchitis, improved at discharge.  3. Morbid obesity and sleep apnea.  4. Non-insulin-dependent diabetes.  5. Renal insufficiency with a creatinine of 2.29 at discharge.  6. Known coronary disease with catheterization on May 29, 2006      showing an saphenous vein graft to right coronary artery to be      occluded, left internal mammary artery to left anterior descending      was patent, the patient had bypass surgery in 1998.  7. Normal left ventricular function.  8. Treated hypothyroidism.  9. Treated dyslipidemia.   HOSPITAL COURSE:  The patient is a 76 year old male well-known to Dr.  Allyson Sabal with history of coronary disease and diastolic dysfunction.  He  was transferred from [facility] after he was admitted there by  Dr. Juanetta Gosling with heart failure symptoms.  His BNP by the time he came  here was essentially normal.  He was on antibiotics for superimposed  bronchitis and these were continued.  We put him on IV heparin and  continued diuresis.  BiDil was added.  He was continued on diuretics and  had excellent diuresis.  CPAP was added.  He had a slightly elevated D-  dimer, bilateral lower extremity Dopplers were negative for DVT.  While  he was hospitalized, his sugars were slightly elevated and we covered  him with Lantus, although this was discontinued at discharge.  His  creatinine did drift up from 1.66-2.29.  We held his Lasix for 2 days  prior to discharge and then resumed it at a lower dose at discharge.  He  will need a followup BMP as an outpatient.  We cut back his Ranexa at  discharge because of his renal insufficiency.  He was discharged on  June 19, 2007.   DISCHARGE MEDICATIONS:  1. Omeprazole 20 mg twice a day.  2. Allopurinol 75 mg 2 tablets a day.  3. Levothyroxine 0.15 mg a day.  4. Metoprolol 25 mg b.i.d.  5. Plavix 75 mg a day.  6. Amlodipine 10 mg a day.  7. Glipizide 10 mg twice a day.  8. Januvia 50 mg a day.  9. Zocor 40 mg a day.  10.Zetia 10 mg a day.  11.Bayer aspirin 81 mg a day.  12.Lasix 20 mg a day.  13.BiDil t.i.d.  14.Spiriva 18 mcg a day.   He has been instructed to cut back his Ranexa at 500 mg a day and  stopped his isosorbide and Avandia.   LABORATORY DATA:  White count 8.5, hemoglobin 10.1, hematocrit 30,  platelets 305.  Sodium 133, potassium 4.3, BUN 42, creatinine 2.29.  Urine culture showed no growth on the 14th.  BNP on the 21st was 111.  Chest x-ray on the 16th showed stable moderate cardiac enlargement and  some evidence of fibrosis.  Telemetry shows sinus rhythm, sinus  bradycardia.  EKG, sinus rhythm with sinus bradycardia without acute  changes.   DISPOSITION:  The patient discharged in stable condition and will  followup with [name] in a couple weeks as an outpatient.  He will  have a BMP next week, we will need to keep an eye on his creatinine.  His discharge weight is 123 kg.      [name], P.ALenard Lance  D:  06/19/2007  T:  06/20/2007  Job:  [id number]   cc:   Ramon Dredge L. Juanetta Gosling, M.D.

## 2010-07-12 NOTE — Discharge Summary (Signed)
NAMEDACOTA, [name]               ACCOUNT NO.:  [id number]   MEDICAL RECORD NO.:  [id number]          PATIENT TYPE:  INP   LOCATION:  A218                          FACILITY:  [facility]   PHYSICIAN:  Edward L. Juanetta Gosling, M.D.DATE OF BIRTH:  December 17, 1933   DATE OF ADMISSION:  06/07/2007  DATE OF DISCHARGE:  04/10/2009LH                               DISCHARGE SUMMARY   FINAL DISCHARGE DIAGNOSES:  1. Congestive heart failure.  2. Confusion, possibly related to CHF (congestive heart failure).  3. Hypertension.  4. Diabetes.  5. Chronic low back pain.  6. Hyperlipidemia.  7. Hypothyroidism.  8. History of peripheral vascular disease in multiple areas.   HISTORY:  Mr. Gloss is a 77 year old who came to the emergency room  with shortness of breath.  He had been having confusion for several days  but it was not clear what his confusion was from.  I had seen him in my  office on the day prior to admission and he was mildly confused but  seemed to get better.  I had him get extensive lab work, a chest x-ray  and CT of his brain done but none of those were revealing.  He developed  increasing shortness of breath early on the morning of admission and  came to the emergency room where he was found to have congestive heart  failure.  Physical exam showed that he had rales in the bases.  His  heart was regular.  His abdomen soft.  Extremities showed no edema.   HOSPITAL COURSE:  He was started on diuresis but because of the holiday  schedule his cardiologist was not going to be available her at [facility] and because he had new congestive heart failure and had a fairly  recent admission to [facility] with coronary disease exacerbation he was  transferred to [facility] so that he could have cardiac care.  He was transferred by [facility] on April 10.      Edward L. Juanetta Gosling, M.D.  Electronically Signed     ELH/MEDQ  D:  06/09/2007  T:  06/09/2007  Job:  [id number]

## 2010-07-15 NOTE — Cardiovascular Report (Signed)
NAME:  [name], [name]                         ACCOUNT NO.:  [id number]   MEDICAL RECORD NO.:  [id number]                   PATIENT TYPE:  INP   LOCATION:  3738                                 FACILITY:  [facility]   PHYSICIAN:  Nanetta Batty, M.D.                DATE OF BIRTH:  07-27-1933   DATE OF PROCEDURE:  DATE OF DISCHARGE:                              CARDIAC CATHETERIZATION   CLINICAL HISTORY:  The patient is a 77 year old married white male with a  history of CAD status post coronary artery bypass grafting in 1998 with a  LIMA to his LAD, a vein graft to a marginal branch in the distal right.  He  also had left vertebral artery PTA and stenting by Dr. Antony Contras in  [place].  I saw him in the office yesterday complaining of increasing  dyspnea and substernal chest pain.  He was admitted to [facility] and  placed on IV heparin.  He ruled out for myocardial infarction.  He presents  now for diagnostic coronary angiography.  His last catheterization was  December 22, 1998.   DESCRIPTION OF PROCEDURE:  The patient was brought to the second floor [facility] in the postabsorptive state.  He was  premedicated with p.o. Valium.  His right groin was prepped and shaved in  the usual sterile fashion.  Xylocaine, 1%, was used for local anesthesia.  A  6-French sheath was inserted into the right femoral artery using the  standard Seldinger technique.  Judkins 6-French right and left Judkins  diagnostic catheters, as well as a 6-French pigtail catheter, were used for  selective coronary angiography, left ventriculography, selective IMA  angiography, and selective vein graft angiography, as well as distal  abdominal aortography.  Omnipaque dye was used for the entirety of the case.  Retrograde aortic, left ventricular, and pullback pressures were recorded.   HEMODYNAMICS:  1. Aortic systolic pressure 176, diastolic pressure 84.  2. Left ventricular  systolic pressure 176, end diastolic pressure 32.   SELECTIVE CORONARY ANGIOGRAPHY:  1. Left main:  Normal.  2. LAD:  The LAD has 70% segmental proximal, followed by 95% focal mid,     stenosis.  There was competitive flow.  3. Left circumflex:  Occluded in its proximal portion.  4. Right coronary artery:  A 90% proximal, 70% mid, and occluded distally at     the genu.  This was a dominant vessel.  5. Vein graft to the distal right:  Totally occluded at its distal     anastomosis.  6. Vein graft to the circumflex marginal branch:  Widely patent.  7. LIMA to the LAD:  Widely patent.  8. It should be noted that the left vertebral stent was subselectively     visualized and had an 80% ostial stenosis, unchanged from the prior     angiogram 4 years ago.  9. Left ventriculography:  RAO left ventriculogram was performed using 20 cc     of Omnipaque dye at 10 cc/second.  The overall LVEF was estimated at     approximately 45% with moderate inferobasal hypokinesia.  10.      Distal abdominal aortography:  This was performed using 20 cc of     Omnipaque dye at 20 cc/second.  The renal arteries are widely patent.     Infrarenal abdominal aorta had iliac bifurcation.  It appeared free of     significant atherosclerotic changes.   IMPRESSION:  The patient has a newly occluded distal right coronary artery  saphenous vein graft compared to his prior study performed December 22, 1998.  This is a dominant vessel, and there are faint right-to-right collaterals  noted.  He has a new inferobasal wall motion abnormality.  I suspect that  this occlusion is subacute, probably within the last several weeks.  The  issue will be whether to treat him medically or to attempt percutaneous  coronary intervention of either the distal right coronary artery saphenous  vein graft or the native right.   The sheaths were removed, and pressure was held on the groin to achieve  hemostasis.  The patient left the lab in  stable condition.  He will be  reheparinized in 4 hours, and his films will be reviewed with my colleagues  prior to a final disposition.                                                 Nanetta Batty, M.D.    JB/MEDQ  D:  05/29/2002  T:  05/30/2002  Job:  [id number]   cc:   Cardiac Catheterization Lab   [name] [place]

## 2010-07-15 NOTE — H&P (Signed)
NAME:  [name], [name]                         ACCOUNT NO.:  [id number]   MEDICAL RECORD NO.:  [id number]                   PATIENT TYPE:  INP   LOCATION:  NA                                   FACILITY:  [facility]   PHYSICIAN:  Ines Bloomer, M.D.              DATE OF BIRTH:  May 13, 1933   DATE OF ADMISSION:  10/27/2003  DATE OF DISCHARGE:                                HISTORY & PHYSICAL   His primary care [name] is [name], M.D.  His cardiologist is  [name], M.D.   HISTORY OF PRESENT ILLNESS:  This 77 year old Caucasian man was referred  from [name] for evaluation of an enlarging pulmonary nodule of the  right lung.  [name] had a CT scan of the chest three months ago, which  revealed small right lung nodules.  The largest one was 7 mm in the right  lower lobe.  A repeat CT scan showed this nodule to have increased in size  to 9 mm.  He was evaluated by Dr. Edwyna Shell at the [facility] office on August 23.  After examination of the patient and review of available records, Dr. Edwyna Shell  recommended surgical resection of this nodule.  With [name] history of  coronary disease, Dr. Edwyna Shell did recommend continuing his cardiac workup  prior to proceeding with surgery.  This workup is to include a Cardiolite  study, which was performed on October 21, 2003.  The results are not  immediately available to me.  The procedure risks and benefits of right  video-assisted thoracoscopy and lung biopsies were reviewed with [name]  by Dr. Edwyna Shell.  The patient agrees to proceed with surgery.   PAST MEDICAL HISTORY:  1. Coronary artery disease with coronary artery bypass grafting in 1998 by     Dr. Particia Lather.  2. Dyslipidemia.  3. Diabetes mellitus type 2.  4. Hypothyroidism.  5. BPH.  6. Nephrolithiasis, status post lithotripsy four to five years ago by Dr.     Logan Bores.  Last episode of nephrolithiasis May 2005.  This was treated with     an antibiotic.  7. He is  status post vertebral artery stenting in [place].  8. Back surgery, 1987.  9. Cataract and laser surgery, both eyes.   The patient has no known drug allergies; however, he is intolerant of  Demerol, which causes nausea and vomiting.   MEDICATIONS PRIOR TO ADMISSION:  1. Irbesartan 150 mg daily.  2. Isosorbide 30 mg b.i.d.  3. Metoprolol 25 mg daily.  4. Simvastatin 80 mg daily.  5. Levothyroxine 0.15 mg daily.  6. Rosiglitazone 8 mg daily.  7. Glipizide 10 mg b.i.d.  8. Metformin 1000 mg b.i.d.  9. Plavix 75 mg daily.  10.      Allopurinol 300 mg daily.  11.      Omeprazole 20 mg b.i.d.  12.      Aspirin  81 mg daily.   SOCIAL HISTORY:  [name] and his wife have been married 46 years.  They  have two grown children.  He is a retired Naval architect.  He quit smoking  tobacco in 1988.  Does not drink alcohol.  He lives with his wife in a  single-level dwelling.  [name] will be available for assistance after  discharge from the hospital.   FAMILY HISTORY:  Diabetes in his mother and his daughter, also several  cousins with diabetes.   REVIEW OF SYSTEMS:  [name] feels his health is fairly good.  No  complaints of headaches.  No difficulties chewing or swallowing.  He does  wear eyeglasses and is quite hard of hearing.  He has dyspnea on exertion  and occasional angina, does not require any nitroglycerin.  His weight has  been stable at approximately 285 pounds.  His appetite is okay.  There has  been no change in his bowel habits.  No recent dysuria or hesitancy.  No  syncope.  He does have occasional dizziness if he stands up too quickly.  He  does have chronic back pain.  He ambulates independently, does not use any  assistive devices.  No lower extremity claudication.  No recent fevers or  infection.  He reports that he is sleeping okay.   PHYSICAL EXAMINATION:  VITAL SIGNS:  Blood pressure 138/68, heart rate is 60  beats per minute.  Height 5 feet 11 inches,  weight 285.  HEENT:  Eyes:  PERRLA, EOMI.  Oral mucosa is pink and moist.  NECK:  With a full range of motion.  No carotid bruits, thyromegaly, or  lymphadenopathy.  CHEST:  His breathing is unlabored.  His breath sounds are clear  bilaterally.  CARDIAC:  Regular rate and rhythm.  His heart sounds are distant.  He does  have a well-healed median sternotomy scar.  ABDOMEN: Rotund with bowel sounds.  It is soft and nontender.  LOWER EXTREMITIES:  With 1+ edema bilaterally.  He has 2+ distal pulses.  He  does have well-healed leg incisions.  NEUROLOGIC:  He is alert and oriented.  Cranial nerves II-XII are grossly  intact.  His gait is steady.   LABORATORY DATA:  Pending and will include a CBC, BMET, PT/INR, and a  urinalysis.   IMPRESSION:  This is a 77 year old man with an enlarging right lower lobe  lung nodule.   PLAN:  Admission to [facility] under the care of Dr. Edwyna Shell on  October 27, 2003, for a right video-assisted thoracoscopy and lung biopsies.      [name], N.P.                  Ines Bloomer, M.D.    CTK/MEDQ  D:  10/23/2003  T:  10/23/2003  Job:  [id number]

## 2010-07-15 NOTE — H&P (Signed)
[name], [name] NO.:  [id number]   MEDICAL RECORD NO.:  [id number]          PATIENT TYPE:  INP   LOCATION:  1845                         FACILITY:  [facility]   PHYSICIAN:  Nanetta Batty, M.D.   DATE OF BIRTH:  [birth year]   DATE OF ADMISSION:  06/23/2004  DATE OF DISCHARGE:                                HISTORY & PHYSICAL   CHIEF COMPLAINT:  Neck and shoulder pain.   HISTORY OF PRESENT ILLNESS:  [name] is a 77 year old male well known to  [name] with a long history of coronary disease.  He had bypass surgery in  1988.  He had a native RCA intervention in 2004, at that time his SVG to RCA  was occluded.  He was recently admitted in March and underwent CYPHER  stenting to an ostial stenosis in the SVG to the circumflex.  He was  discharged May 19, 2004.  He just had a Cardiolite study done May 30, 2004 which showed some scar with superimposed ischemia.  Yesterday he noted  some recurrence of his posterior neck pain and left jaw pain.  This was  similar to his pre-intervention symptoms.  It improved with nitroglycerin.  Symptoms recurred today and were worse and he called EMS and is now seen in  [facility].   PAST MEDICAL HISTORY:  His past medical history is remarkable for peripheral  vascular disease.  He has had previous vertebral artery stenting in  [place].  He had a VATS in August 2005 for lung lesions that turned out  to be nonmalignant.  He is on steroids per Dr. Juanetta Gosling.  He has obesity and  diabetes and hyperlipidemia.  He has sleep apnea.  He has BPH.  He has  normal LV function by his catheterization.   MEDICATIONS:  He is on several medications.  Currently he says his medicines  are unchanged from his discharge medicines which are Plavix 75 mg a day,  aspirin 81 mg a day, Toprol XL 25 mg a day, Losartan 50 mg a day, felodipine  10 mg a day, Imdur 30 mg a day, Lipitor 80 mg a day, Niaspan 500 mg bedtime,  hydrochlorothiazide 12.5 mg a  day, prednisone 3 mg a day, allopurinol 300 mg  a day, levothyroxine 0.15 mg a day, glipizide 10 mg a day, Glucophage 1 g  b.i.d., Avandia 8 mg a day, multivitamin and Xalatan eye drops.   ALLERGIES:  He has no known drug allergies but is intolerant to DEMEROL.   SOCIAL HISTORY:  He is married, has been for 46 years.  He lives with his  wife.  He quit smoking in 1988.  He has two children.  He is a retired Ecologist.   FAMILY HISTORY:  His family history is remarkable for diabetes.   REVIEW OF SYSTEMS:  He has gained a lot of weight since he has been on  steroids.  He also had some anemia and some groin bleeding after his last  catheterization.  He has had some transient visual loss on the left eye and  has  a known carotid stenosis that [name] was going to work up as an  outpatient.   PHYSICAL EXAMINATION:  VITAL SIGNS:  Blood pressure 160/60, pulse 68,  respirations 18.  GENERAL:  He is a well-developed overweight male in no acute distress.  HEENT:  Normocephalic.  Extraocular movements are intact.  Sclerae is  nonicteric.  Lids and conjunctivae are within normal limits.  NECK:  Without JVD.  I could not auscultate a bruit.  CHEST:  Essentially clear.  He has diminished breath sounds at the right  base.  CARDIAC EXAM:  Reveals regular rate and rhythm without obvious murmur, rub,  or gallop.  ABDOMEN:  Obese, nontender.  EXTREMITIES:  Reveal trace edema bilaterally.  There is no right groin  hematoma noted.  NEUROLOGIC EXAM:  Grossly intact.  He is awake, alert and oriented,  cooperative and moves all extremities without obvious deficit.   IMPRESSION:  1.  Unstable angina.  2.  Known coronary disease with coronary artery bypass grafting in 1988,      native right coronary artery stenting in 2004 and recent saphenous vein      graft to the circumflex CYPHER stenting by [name] May 17, 2004.  3.  Normal left ventricular function.  4.  Noninsulin-dependent diabetes.   5.  Known peripheral vascular disease with previous vertebral artery      stenting and known carotid artery stenosis that we have been following.  6.  Treated hypothyroidism.  7.  Status post video assisted thoracic surgery procedure for nonmalignant      lung lesions, on chronic steroids now.  8.  Dyslipidemia.  9.  Question transient ischemic attacks.   PLAN:  Patient is admitted by Dr. Jacinto Halim and myself and will be put on IV  nitrites but not heparin unless he has positive enzymes or recurrent chest  pain per patient preference.  We will set him up for carotid Doppler's and  make him n.p.o. after midnight possible restudy tomorrow after he is  evaluated.      LKK/MEDQ  D:  06/23/2004  T:  06/23/2004  Job:  [id number]

## 2010-07-15 NOTE — Cardiovascular Report (Signed)
NAMEBRONDON, [name] NO.:  [id number]   MEDICAL RECORD NO.:  [id number]          PATIENT TYPE:  INP   LOCATION:  4711                         FACILITY:  [facility]   PHYSICIAN:  Nanetta Batty, M.D.   DATE OF BIRTH:  [birth year]   DATE OF PROCEDURE:  05/17/2004  DATE OF DISCHARGE:                              CARDIAC CATHETERIZATION   CARDIAC CATHETERIZATION/PCI AND STENT REPORT:  Mr. Wakeley is a 77 year old  moderately overweight, married, white male with a history of CAD and PVOD.  His other problems include hypertension, hyperlipidemia and diabetes.  He  has had coronary artery bypass grafting in 1998 and has had a left vertebral  stent placed in [place] subsequent to that.  He had a complex RCA  intervention by Dr. Daphene Jaeger 2 years ago involving 6 stents and a totally  occluded native RCA.  He had a recent Cardiolite that showed inferior  ischemia.  The patient was admitted May 16, 2004, with neck pain which is  his anginal equivalent.  We have placed him on IV heparin and  nitroglycerin.  His point of care markers are negative.  He presents now for  diagnostic coronary arteriography.   PROCEDURE DESCRIPTION:  The patient was brought to the [facility] Lab in the postabsorptive state.  He was premedicated with  p.o. Valium.  His right groin was prepped and shaved in the usual sterile  fashion.  Xylocaine 1% was used for local anesthesia.  A 6 French sheath was  inserted into the right femoral artery using standard Seldinger technique.  The 6 French right and left Judkins diagnostic catheter as well as a 6  French pigtail catheter were used for selective coronary angiography, left  ventriculography, selective vein graft angiography, selective IMA  angiography, and distal abdominal aortography.  Visipaque dye was used for  the entirety of the case.  Retrograde aorta, left ventricular pullback  pressures were recorded.   HEMODYNAMICS:  1.  Aortic systolic pressure 184, diastolic pressure 80.  2.  Left ventricular systolic pressure 85, end-diastolic pressure 23.   SELECTIVE CORONARY ANGIOGRAPHY:  1.  Left main normal.  2.  LAD; LAD had 80% segmental proximal stenosis.  There is a 99% stenosis      in the midportion at the takeoff of a diagonal branch with competitive      flow noted.  3.  Left circumflex; occluded in the proximal portion.  4.  Right coronary artery; occluded in the proximal portion.  5.  Vein graft to the RCA; the Y graft to the mid right was widely patent as      were the distal stents.  The proximal stented segment was occluded.  The      continuation of the graft was occluded as well.  6.  Circumflex vein graft; 95% stenosis at the proximal portion.  7.  LIMA to the LAD and diag sequentially widely patent.  8.  Left ventriculography; RAO left ventriculogram is performed using 25 mL      of Visipaque dye at 12 mL/sec.  The overall  LVEF was estimated at      approximately 50-55% with mild inferior hypokinesia.  9.  Distal abdominal aortography; a distal abdominal aortogram was performed      using 20 mL of Visipaque dye at 20 mL/sec.  The renal arteries were      widely patent.  The infrarenal abdominal aorta and iliac bifurcation      appear to have no significant atherosclerotic changes.   IMPRESSION:  Mr. Bushnell has a high grade proximal circumflex vein graft  stenosis.  His previously stented native right is occluded and his proximal  right vein graft is patent, supplying the distal vessel.   PLANS:  To perform PCI stenting of the proximal circumflex vein graft.   PROCEDURE DESCRIPTION:  The existing 6 French sheath in the right femoral  artery was exchanged over a wire for a 7 [place] sheath.  A 6 French sheath  was then inserted into the right femoral vein.  The patient received 2  aspirin chewable p.o., 300 mg of p.o. Plavix, 3500 units of heparin  intravenously and Integrilin  double bolus infusion.  The patient was on  Plavix 75 mg p.o. at home.   Using a 7 French left vein graft, left bypass graft guide catheter along  with __________ 190 Asahi soft wire 20 12 Quantum Maverick, PCI is performed  of the proximal vein graft.  This was then stented with a 30 13 CYPHER stent  deployed under fluoroscopic and angiographic control at 15 atmospheres,  resulting in reduction of a 95% proximal left bypass graft stenosis to 0%  residual with excellent flow.  There was no distal embolization and  completion angiography revealed the distal OM branch to be widely patent.   OVERALL IMPRESSION:  Successful circumflex saphenous vein graft,  percutaneous coronary intervention and stenting using a CYPHER drug-eluting  stent.   The patient tolerated the procedure well.  There were no hemodynamic or  electrocardiographic sequelae.  The guide wire and catheter were removed.  The sheaths were sewn securely in place.  The patient left the lab in stable  condition.  The sheaths will be removed once the AST falls below 150.  Integrilin will be continued for 18 hours.  The patient will be treated with  aspirin and Plavix and discharged home in the morning if he remains  __________ stable over night.  He left the lab in stable condition.      JB/MEDQ  D:  05/17/2004  T:  05/17/2004  Job:  [id number]   cc:   Patient's Chart   [facility]   [facility]  [address]  [place], [place] [address]   Oneal Deputy. Juanetta Gosling, M.D.  [address]  [place]  [place] [address]  Fax: [phone]

## 2010-07-15 NOTE — Discharge Summary (Signed)
NAMEMCKENNA, BORUFF               ACCOUNT NO.:  [id number]   MEDICAL RECORD NO.:  [id number]          PATIENT TYPE:  OIB   LOCATION:  3731                         FACILITY:  [facility]   PHYSICIAN:  Nanetta Batty, M.D.   DATE OF BIRTH:  1933-09-11   DATE OF ADMISSION:  05/29/2006  DATE OF DISCHARGE:  05/30/2006                               DISCHARGE SUMMARY   DISCHARGE DIAGNOSES:  1. Coronary artery disease, status post coronary artery bypass      grafting remotely.  Patient had a cath during this admission      showing patent graft, no progression of coronary disease.      Recommendations, he will continue with his medical therapy.  2. Hypertension.  3. Hyperlipidemia.  4. Adult onset diabetes mellitus.  5. Known peripheral vascular disease.  6. Obstructive sleep apnea, noncompliant with medical CPAP therapy.   HPI/HOSPITAL COURSE:  This is a 77 year old gentleman, patient of Dr.  Allyson Sabal, with a previous history of coronary artery disease, status post  CABG in 1998, presented to our office in  as a walking with  complaints of increasing shortness of breath.  Patient also had a  complaint of right-sided neck discomfort and he was treated with  Skelaxin and Vicodin for presumed muscle spasm.  He was evaluated by Dr.  Domingo Sep because his progressive shortness of breath was concerning, he  was scheduled for coronary angiography to rule out progression of  coronary disease.   Patient was admitted on May 29, 2006 and underwent cardiac  catheterization the same day.  This catheterization was performed by Dr.  Allyson Sabal and it showed mild hypokinesis of the left ventricular with normal  EF around 65%, patent SVG to the RCA and LIMA to the LAD, occluded graft  to obtuse marginal.  Recommendations were to continue with medical  therapy.   The next morning, patient was evaluated by [name] and he was  considered to be stable for discharge home.  His vital signs were within  normal limits.  Coenzyme did not show any signs of hematoma or  ecchymosis.   His blood work revealed hemoglobin 10.9, hematocrit 32.2, white blood  cell count 5.9, platelet count 213.  Sodium 130, potassium 3.5, BUN 22,  creatinine 1.53, chloride 102, CO2 30 and glucose 90.   DISCHARGE INSTRUCTIONS:  Patient was instructed to stay on a low-sodium,  low-cholesterol and low-fat diet.  He was allowed to increase activity  slowly, not to lift weights or drive for 3 days postcath.   MEDICATIONS:  1. Omeprazole 20 mg b.i.d.  2. Allopurinol 150 mg daily.  3. Levothyroxine 0.15 mg daily.  4. Metoprolol 25 mg daily.  5. Plavix 75 mg daily.  6. Imdur 30 mg b.i.d.  7. Felodipine 10 mg daily.  8. Glipizide 10 mg b.i.d.  9. Avandia 8 mg daily.  10.Zocor 80 mg daily.  11.Januvia 50 mg daily.  12.Aspirin 81 mg daily.  13.Furosemide 40 mg daily.  14.Ferrous sulfate 325 mg b.i.d.  15.Silexin p.r.n.  16.Vicodin p.r.n.   DISCHARGE FOLLOWUP:  [name] will see patient on April  10 at 11 a.m.  in our office.      Raymon Mutton, P.A.      Nanetta Batty, M.D.  Electronically Signed    MK/MEDQ  D:  05/30/2006  T:  05/30/2006  Job:  [id number]   cc:   Ramon Dredge L. Juanetta Gosling, M.D.

## 2010-07-15 NOTE — Cardiovascular Report (Signed)
NAME:  [name], [name]                         ACCOUNT NO.:  [id number]   MEDICAL RECORD NO.:  [id number]                   PATIENT TYPE:  INP   LOCATION:  2029                                 FACILITY:  [facility]   PHYSICIAN:  Nicki Guadalajara, M.D.                  DATE OF BIRTH:  04-Oct-1933   DATE OF PROCEDURE:  06/01/2002  DATE OF DISCHARGE:  06/05/2002                              CARDIAC CATHETERIZATION   INDICATIONS:  [name] is a 77 year old gentleman with known CAD,  status post CBG revascularization surgery in 1998 with a LIMA to the LAD, a  vein to the marginal vessel, and sequential vein into the mid RCA as well as  a second limb into the PDA vessel.  The patient underwent catheterization on  May 29, 2002, by Nanetta Batty, M.D., where he was found to have total  native RCA occlusion with subtotal total occlusion of the second limb of the  graft to the RCA.  He had a patent LIMA to his LAD and patent vein to his  marginal.  Because of recurrent chest pain and a possibility of a relatively  fresh occlusion in this second limb of the vein graft to the RCA, decision  was made to attempt possible coronary intervention of this vein graft.   PROCEDURE:  Technically, this was a very difficult, complex, and prolonged  procedure which lasted six hours in duration.  This was performed via the  right femoral artery where a 7-French sheath was inserted.  The patient had  received double bolus Integrilin as well as weight-adjusted heparinization.  He also was started on Plavix and received IC nitroglycerin.  Initial  attempt was made at opening up the saphenous vein graft supplying the distal  limb to the right coronary artery.  ACT was documented to be therapeutic.  A  Scimed 7-French right bypass guide was used for the procedure.  Initially, a  Patriot wire was advanced and was able to cross the total occlusion in the  limb of graft; however, this wire was not able to cross  distally into the  PDA.  Ultimately, a 300 cm PT Graphics wire was administered.  Dilatation  was made with a 2.5 x 20 mm Maverick ballon at the site of initial total  occlusion.  Again, this wire was still unable to cross the apparent total  occlusion in the PDA and additional wires, including a Crossit wire as well  as a Luge wire were used.  Although the graft, itself, was opened in the  proximal segment of this distal limb, it was never able to have filling in  the PDA vessel.  Consequently, after a long attempt with multiple  dilatations in this distal limb, the decision was made to attempt opening up  the chronically occluded, diffusely diseased, native right coronary artery.  The native right coronary artery had diffuse 90% proximal stenosis  followed  by 80% stenosis and then had diffuse 70% stenosis where the initial limb of  the vein graft anastomosed.  In the region of the crux, the RCA was totally  occluded.  Again, this phase of the procedure was very difficult secondary  to backup difficulties as well as wire support.  The native RCA was severely  diffusely diseased and it had a chronic, old occlusion.  Ultimately, the  chronic occlusion was able to be opened and wire was advanced to the distal  portion of the RCA.  This revealed continued, diffuse, severe disease before  as well as after the PDA and several small branches.  The distal PLA proved  to be very small.  Ultimately, a total of six stents were successfully  deployed with an ostial proximal Cypher 3.0 x 13 mm stent followed by a 3.0  x 33 mm stent before the first saphenous vein limb anastomosis.  Following  this, there was a 3.0 x 32 mm Taxus stent in overlap, which extended to the  region of the crux.  In order to get the more distal lesions, due to the  vessel tortuosity and backup difficulties, S7 stents were utilized and three  replaced in tandem, being 3.0 x 30, 3.0 x 12, and 3.0 x 24.  Stents were  postdilated  to approximately 3.5 mm proximally.  At the completion of the  procedure, the native RCA was now widely opened and gave rise to several  small PDA vessels, inferior vessel, and a diffusely diseased small PLA  system.   HEMODYNAMICS:  Central aortic pressure was 166/93.   ANGIOGRAPHIC DATA:  The native right coronary artery had diffuse 90% and 70%  ostial and proximal stenoses followed by 80% proximal to mid stenoses.  This  was then 70% diffusely stenosed in the region where the first limb of the  vein graft anastomosed and then was totally occluded after this segment.  The proximal limb of the vein graft anastomosing into the mid RCA was widely  patent.  The second limb of this sequential graft was totally occluded  proximally.  As noted above, following dilatation of the distal limb, this  distal limb was opened at the site of total occlusion but apparently, the  PDA branch was never able to be entered via this limb and consequently,  attention was therefore directed at opening up the diffusely diseased native  right coronary artery with chronic total occlusion.  Following the  difficult, complex, six-hour intervention, the native right coronary artery  was opened with three proximal to mid convoluting stents including a Cypher  3.0 x 13, 3.0 x 33, and Taxus 3.0 x 32 mm drug-alluding stent.  In the  distal aspect, there were three S7 3.0 x 30, 3.0 x 12 in between, and 3.0 x  24 AVE stents.  This was widely patent but, unfortunately, the PLA vessel  remained diffusely diseased and the PDA and inferior LV branches remained  small caliber.   IMPRESSION:  Complex, difficult, but ultimately successful intervention  involving the diffusely diseased native right coronary artery with chronic  total occlusion with resultant six tandem stents with restoration of TIMI-3  flow placed in tandem fashion as noted above.  Nicki Guadalajara, M.D.   TK/MEDQ   D:  10/02/2002  T:  10/02/2002  Job:  [id number]   cc:   Jarold Motto, M.D.

## 2010-07-15 NOTE — Op Note (Signed)
[name], [name]               ACCOUNT NO.:  [id number]   MEDICAL RECORD NO.:  [id number]          PATIENT TYPE:  AMB   LOCATION:  DAY                           FACILITY:  [facility]   PHYSICIAN:  Jerolyn Shin C. Katrinka Blazing, M.D.   DATE OF BIRTH:  09-Nov-1933   DATE OF PROCEDURE:  09/30/2004  DATE OF DISCHARGE:                                 OPERATIVE REPORT   PREOPERATIVE DIAGNOSIS:  Cholelithiasis, cholecystitis.   POSTOPERATIVE DIAGNOSIS:  Cholelithiasis, cholecystitis.   PROCEDURE:  Laparoscopic cholecystectomy.   SURGEON:  Dr. Katrinka Blazing.   DESCRIPTION:  Under general anesthesia, the patient's abdomen was prepped  and draped in a sterile field. Supraumbilical incision was made, and Veress  needle was inserted uneventfully. Abdomen was insufflated with 3 liters of  CO2. Using a Visiport guide, a 10-mm port was placed. Laparoscope was  placed. Under videoscopic guidance, a 10-mm port and two 5-mm ports were  placed in the right subcostal region. The patient was placed in reversed  Trendelenburg position. Even with this position, we could not visualize the  infundibulum of the gallbladder because of a large amount of intra-abdominal  fat, especially in the omentum. Halfway between the umbilicus and the  xiphoid, an incision was made, and a 12-mm port was placed. The fan  retractor was placed through this. It was fully extended and was used to  retract the tissue obstructing the field of view. With this done, the cystic  duct was dissected. It was clipped with five clips and divided. The cystic  artery branch was dissected. It was clipped four clips and divided. Using  electrocautery, gallbladder was then separated from the intrahepatic bed  without difficulty. It was placed in an EndoCatch device and retrieved.  Hemostasis in the bed was achieved. There was minimal bleeding. There was no  evidence of bile leak. The status of the staples on the cystic duct and  cystic artery were checked, and they  appeared to be tightly adherent.  Irrigation was carried out. The returning fluid was clear. CO2 was allowed  to escape from the abdomen, and the ports were removed. The incisions were  closed.  The fascia at the supraumbilical was closed with 0 Monocryl. All  other incisions were closed with staples on the skin. At the umbilicus, the  staple  line was covered with triple antibiotic ointment and 4x4s. The others were  covered with OpSite. The patient tolerated the procedure well. He was  awakened from anesthesia uneventfully, transferred to a bed, and taken to  the post anesthetic care unit for further monitoring.      [name], M.D.  Electronically Signed     LCS/MEDQ  D:  09/30/2004  T:  09/30/2004  Job:  [id number]   cc:   Ramon Dredge L. Juanetta Gosling, M.D.  [address]  [place]  [place] [address]  Fax: [phone]

## 2010-07-15 NOTE — Discharge Summary (Signed)
[name], [name]               ACCOUNT NO.:  [id number]   MEDICAL RECORD NO.:  [id number]          PATIENT TYPE:  INP   LOCATION:  6527                         FACILITY:  [facility]   PHYSICIAN:  Nanetta Batty, M.D.   DATE OF BIRTH:  02/03/1934   DATE OF ADMISSION:  05/16/2004  DATE OF DISCHARGE:  05/19/2004                                 DISCHARGE SUMMARY   ADMISSION DIAGNOSES:  1.  Unstable angina.  2.  History of coronary artery disease.      1.  History of coronary artery bypass graft, 1988.      2.  History of cardiac catheterization, 2004, with distal saphenous vein          graft to right coronary artery occlusion, with intervention to the          native right coronary artery by Dr. Tresa Endo.      3.  Status post last Cardiolite September 2005, with anterior wall          ischemia, inferolateral wall ischemia, ejection fraction 60%.      4.  Status post myocardial infarction September 2005 prior to that          Cardiolite.  3.  Status post echocardiogram August 2005, with normal left ventricular      function, left ventricular hypertrophy, mild mitral regurgitation, mild      tricuspid regurgitation, mild pulmonary hypertension.  4.  Peripheral vascular disease, with known right coronary artery stenosis.  5.  Noninsulin-dependent diabetes mellitus.  6.  Hypertension.  7.  Hyperlipidemia.  8.  Obesity.  9.  Chronic renal insufficiency.  10. History of right video-assisted thorascopic surgery secondary to right      upper lobe and right lower lobe lesions, subsequently found to have no      malignancy.  11. Hypothyroidism.  12. Benign prostatic hypertrophy.  13. Obstructive sleep apnea.  14. Gout.  15. Nephrolithiasis.   DISCHARGE DIAGNOSES:  1.  Unstable angina.  2.  History of coronary artery disease.      1.  History of coronary artery bypass graft, 1988.      2.  History of cardiac catheterization, 2004, with distal saphenous vein          graft to right coronary  artery occlusion, with intervention to the          native right coronary artery by Dr. Tresa Endo.      3.  Status post last Cardiolite September 2005, with anterior wall          ischemia, inferolateral wall ischemia, ejection fraction 60%.      4.  Status post myocardial infarction September 2005 prior to that          Cardiolite.  3.  Status post echocardiogram August 2005, with normal left ventricular      function, left ventricular hypertrophy, mild mitral regurgitation, mild      tricuspid regurgitation, mild pulmonary hypertension.  4.  Peripheral vascular disease, with known right coronary artery stenosis.  5.  Noninsulin-dependent diabetes mellitus.  6.  Hypertension.  7.  Hyperlipidemia.  8.  Obesity.  9.  Chronic renal insufficiency.  10. History of right video-assisted thorascopic surgery secondary to right      upper lobe and right lower lobe lesions, subsequently found to have no      malignancy.  11. Hypothyroidism.  12. Benign prostatic hypertrophy.  13. Obstructive sleep apnea.  14. Gout.  15. Nephrolithiasis.  16. Status post cardiac catheterization, Dr. Nanetta Batty, May 17, 2004.      He was found to have 99% stenosis at the ostial vein graft to the      circumflex.  He performed percutaneous transluminal coronary angioplasty      stent using Cypher stent.  17. Status post some groin ooze postprocedure.  Hemoglobins remained stable      after monitoring.   HISTORY OF PRESENT ILLNESS:  [name] is a very pleasant 77 year old white  male who had been in his usual state of health until the afternoon of  admission.  At that time, he developed severe pain in the back of the neck  radiating to the occipital area in both shoulders.  He also felt clammy,  weak, short of breath, and nauseous.  On arrival to the emergency room, he  still felt short of breath and had discomfort in his head, but the cervical  and shoulder pain had improved.   EXAMINATION:  BLOOD  PRESSURE:  Stable, 136/55.  HEART RATE:  64.  OXYGEN SATURATION:  98%.  No significant new findings on physical exam.   EKG showed no significant changes compared to prior tracing.  Point of care  markers were negative at that time.   At that time, he was seen and evaluated by Dr. Yates Decamp.  He reviewed that  his last Cardiolite scan was abnormal and had been treated medically since  then.  He is now presenting with symptoms concerning for angina.  At that  point, we planned to admit him to telemetry, check serial enzymes, rule out  MI.  Will treat him with IV heparin, nitroglycerin.  Continue his aspirin,  Plavix, stating, and beta blocker.  Would plan for cardiac catheterization,  possible intervention.  Risks and benefits were discussed, and he was  agreeable to proceed.   HOSPITAL COURSE:  On the morning of May 17 2004, he remained stable, with  no further complaints.  He was planned for cardiac catheterization later  that day.   On May 17, 2004, he underwent cardiac catheterization by Dr. Nanetta Batty.  Please see the dictated report for detail.  He was found to have a  very tight 99% stenosis in a saphenous vein graft to the left circumflex.  [name] then planned to proceed with intervention and used Cypher  stenting, with 95% stenosis to 0% residual.  We planned to continue  Integrilin for 18 hours postprocedure as well as the other medications as  above.  Again, see the dictated report for other details of the findings.   On May 18, 2004, he was without chest pain but was feeling a little weak.  He was hemodynamically stable.  Blood pressure 132/68; heart rate 68.  His  right groin had some ecchymosis, and also it had some bleeding during the  previous night.  His heparin and Integrilin had been stopped.  At this  point, his hemoglobin is 9.8, and it had been 11.5 at admission.  At this point, he was seen and evaluated by Dr. Julieanne Manson.  He planned to  keep   the patient overnight for further monitoring.  We would follow up  hemoglobins, hematocrits, and also monitor his groin site.   On the morning of May 19, 2004, [name] is doing well.  He is having no  chest pain.  He has had no further groin bleed or ooze.  He has been  ambulating in the hallway some and still with no bleed or ooze.  At this  point, he is afebrile at 97; pulse 65; blood pressure 130/60; oxygen  saturation 98% on room air.  Labs are stable.  His hemoglobin has been  stable.  On the morning of May 17, 2004, hemoglobin was 9.8, hematocrit  28.3.  On May 18, 2004 at 4:00 in the morning, it was 9.8 and 28.3.  On  May 18, 2004 at 15:30, 10.3 and 29.6, and now on the morning of May 19, 2004, hemoglobin is 10.1, hematocrit 28.8.  Remainder of labs had remained  stable.  He has remained in sinus rhythm with no arrhythmias.  Lungs are  clear.  Heart is in regular rhythm.  His right groin has in one area a small  dark ecchymosis, but there is no hematoma or bleed.  This is stable and  unchanged from May 18, 2004.   At this point, he was seen and evaluated by Dr. Julieanne Manson, who deemed  him stable for discharge home.   HOSPITAL CONSULTS:  None.   HOSPITAL PROCEDURES:  1.  Cardiac catheterization by Dr. Nanetta Batty, May 17, 2004.  See the      dictated report for all findings.  He was found to have a tight stenosis      in the saphenous vein graft to the left circumflex.  He proceeded with      PTCA stent using Cypher stenting.  It went from 95% stenosis to 0%      residual.  Plan for Integrilin for 18 hours postprocedure.  Continue      aspirin and Plavix.  Again, see the dictated report for other findings      and details.  2.  Radiology.  Chest x-ray shows no active disease.  Stable appearance      since April 27, 2004.  3.  EKG showed no new significant changes compared to prior tracings.   LABORATORY REVIEW:  At the time of admission, May 16, 2004,  white count  10.3, hemoglobin 10.1, hematocrit 29.2, platelets 308.  At that point, INR  0.7.  Sodium 137, potassium 4.8, BUN 24, creatinine 1.6.  TSH normal at  3.027.  Cardiac enzymes are negative, with CKs of 63, 70, and 48.  CK-MBs  are 2.9, 2.6, and 2.3.  Troponins 0.02, 0.03, and 0.04.  Liver function  tests are normal.  Lipid profile shows total cholesterol 208, triglycerides  276, HDL 38, LDL 115.  For details of the hemoglobin, hematocrit drop, see  above in the hospital course.  Those were dictated.   DISCHARGE LABORATORIES:  On May 19, 2004, white count 8, hemoglobin 10.1,  hematocrit 28.8, platelets 240.  The last BMET was May 18, 2004, which  showed sodium 136, potassium 4.1, BUN 18, creatinine 1.2, glucose 188.   DISCHARGE MEDICATIONS:  1.  Plavix 75 mg daily.  2.  Aspirin 81 mg daily.  3.  Toprol XL 25 mg daily  4.  Losartan 50 mg daily.  5.  Felodipine 10 mg daily.  6.  Imdur 30 mg daily. 7.  Lipitor  80 mg daily.  8.  Niaspan 500 mg at bedtime.  9.  HCTZ 12.5 mg daily.  10. Prednisone.  Continue your taper as you have been instructed by Dr.      Juanetta Gosling prior to admission.  11. Allopurinol 300 mg daily.  12. Levothyroxine 0.15 mg daily.  13. Glipizide 10 mg b.i.d.  14. Glucophage 1,000 mg b.i.d.  To start this tomorrow, May 20, 2004.  15. Avandia 8 mg daily.  16. Multivitamin daily.  17. Xalatan eye drops as before.  18. Cefuroxime 250 mg __________ as before.   No strenuous activity, lifting greater than 5 pounds, driving, or sexual  activity for five days.   Low-cholesterol diabetic diet.   May gently wash the groin site with warm water and soap.   Call [phone] for any bleeding or increased tenderness in the groin site.   Follow up with [name] in the [facility] office, May 30, 2004 at 2:45  p.m.  The patient does live in [place] but prefers to come to the  [place] office.      MBE/MEDQ  D:  05/19/2004  T:  05/19/2004  Job:  [id number]

## 2010-07-15 NOTE — Cardiovascular Report (Signed)
NAME:  TREVARIS, PENNELLA NO.:  [id number]   MEDICAL RECORD NO.:  [id number]          PATIENT TYPE:  INP   LOCATION:  3739                         FACILITY:  [facility]   PHYSICIAN:  Nicki Guadalajara, M.D.     DATE OF BIRTH:  1933/10/16   DATE OF PROCEDURE:  06/24/2004  DATE OF DISCHARGE:                              CARDIAC CATHETERIZATION   INDICATIONS:  Mr. Eswin Worrell is a 77 year old gentleman who has known  coronary artery disease.  In May 15, 1996, he underwent bypass surgery by  Dr. Andrey Campanile with a sequential LIMA to his LAD and diagonal, a vein graft to  the obtuse marginal, a vein graft sequentially to the RCA and PDA.  He is  also status post PTCA of the mid distal LAD done via the LIMA graft  initially in 1999 and then in March 2000.  He also is status post complex  stenting to his distal RCA.  He apparently recently underwent cardiac  catheterization in March 2006 by [name] and underwent stenting to the  ostial proximal portion of the vein graft to his circumflex.  He had done  well, but recently developed recurrent symptomatology with posterior neck  pain and left jaw pain, leading to his repeat hospitalization.  He was seen  by [name] and scheduled for cardiac catheterization.   PROCEDURE:  After premedication with Valium intravenously, the patient was  prepped and draped in the usual fashion.  His right femoral artery was  punctured anteriorly and a 5-French sheath was inserted.  Diagnostic  catheterization was done utilizing 5-French Judkins 4 left and right  coronary catheters.  The right catheter was used for selective angiography  into the vein grafts.  A LIMA catheter was used for selective angiography  into the left internal mammary artery.  A pigtail catheter was used for  biplane cine left ventriculography.  Distal aortography was not done since  this recently was done 1 month ago by [name].  Hemostasis was obtained by  direct manual  pressure.  He tolerated the procedure well.   HEMODYNAMIC DATA:  Central aortic pressure was 170/76.  Left ventricle  pressure was 170/20,  post A-wave 34.   ANGIOGRAPHIC DATA:  Left main coronary artery was short normal vessel which  bifurcated into LAD and left circumflex system.   The LAD had 70% diffuse smooth proximal narrowing after the first septal  perforator and then had 95% stenosis after a second septal before his  diagonal,.   The circumflex vessel was totally occluded proximally.   The native right coronary was totally occluded proximally.   The LIMA graft which sequentially supplied the diagonal and LAD was widely  patent.  There was diffuse apical LAD stenosis at a bifurcation of a small  distal LAD with narrowing of 90%.   The vein graft to the circumflex vessel was widely patent.  The stent placed  recently at the ostium was normal.   The vein graft supplying the right coronary artery was widely patent and was  a segmental graft.  One graft anastomosed into the mid  right coronary artery  and the graft beyond this was totally occluded, which was old.  However, the  previously totaled distal native right coronary artery had widely patent  stents from the complex intervention done by me in April 2004.  There was a  small bifurcating PDA vessel, an inferior LV branch was small caliber and  had 95% proximal stenosis followed by a second inferior LV branch and ended  in a posterolateral vessel.   Biplane cine left ventriculography revealed preserved global contractility  without significant focal wall motion abnormalities.   IMPRESSION:  1.  Normal left ventricular contractility.  2.  Significant native coronary artery disease with diffuse 70% proximal      left anterior descending stenosis followed by 95% stenosis after the      first septal perforating artery with competitive filling seen to the      patent left internal mammary artery system distally; subtotaled  to      totaled proximal circumflex stenosis occlusion then total occlusion of      the proximal native right coronary artery.  3.  Patent left internal mammary artery sequentially supplying the      diagonal/left anterior descending system with small distal left anterior      descending with at 80% to 90% bifurcation stenosis near the apex.  4.  Widely patent stent recently placed in March 2006 at the proximal      portion of the circumflex vein graft supplying the marginal vessel.  5.  Widely patent proximal limb of the sequential vein graft supplying the      mid right coronary artery with old total occlusion of the distal limb      and with widely patent multiple stents from the intervention of April      2004 in the native distal right coronary artery and evidence for 95%      narrowing in a small inferior left ventricular branch of the right      coronary artery.   RECOMMENDATIONS:  Medical therapy.      TK/MEDQ  D:  06/24/2004  T:  06/25/2004  Job:  [id number]   cc:   Nanetta Batty, M.D.  Fax: [phone]   Jerilynn Mages Alwyn Ren, M.D. [facility]

## 2010-07-15 NOTE — Cardiovascular Report (Signed)
NAMEIMRAAN, [name] NO.:  [id number]   MEDICAL RECORD NO.:  [id number]          PATIENT TYPE:  OIB   LOCATION:  2807                         FACILITY:  [facility]   PHYSICIAN:  Nanetta Batty, M.D.   DATE OF BIRTH:  11/03/1933   DATE OF PROCEDURE:  05/29/2006  DATE OF DISCHARGE:                            CARDIAC CATHETERIZATION   [name] is a 77 year old moderately-overweight married white male  with CAD status post bypass grafting x5 in 1998.  He also had left  vertebral artery PTA and stenting by Dr. Antony Contras in [place] at  that time.  He has had PCI stenting of his RCA vein graft in April 2006  with a patent LIMA to the LAD and vein graft to an OM.  He has been  complaining of increased dyspnea on exertion.  A Myoview stress test  showed inferolateral scar with moderate superimposed ischemia which is  new since his prior study.  He presents now for diagnostic coronary  angiography to define his anatomy, rule out ischemic etiology.   PROCEDURE DESCRIPTION:  The patient is brought to the second floor [facility] cardiac catheterization laboratory in the postabsorptive state.  He  was premedicated with p.o. Valium.  His right groin was prepped and  shaved in the usual sterile fashion.  Xylocaine 1% was used for local  anesthesia.  A 6-French sheath was inserted into the right femoral  artery using standard Seldinger technique.  The patient did receive a  sodium bicarbonate drip for radiocontrast nephropathy prophylaxis as  well as Mucomyst.  A 6-French right and left Judkins diagnostic catheter  as well as a 6-French pigtail catheter, right bypass graft catheter, and  IMA catheter were used for selective coronary angiography, selective  vein graft and IMA angiography respectively.  Visipaque dye was used for  the entirety of the case.  Retrograde aortic, left ventricular and  pullback pressures were recorded.   HEMODYNAMIC RESULTS:  1. Aortic systolic  pressure 155, diastolic pressure 52.  2. Left ventricular systolic pressure 154, end-diastolic pressure 19.   SELECTIVE CORONARY CHOLANGIOGRAPHY:  1. Left main normal.  2. LAD:  The LAD had a 70% fairly tubular segmental stenosis in the      proximal third.  There was a focal 90% stenosis in the mid LAD just      prior to the takeoff of a large diagonal branch.  There was      competitive flow distally.  There was a 90% apical LAD lesion which      is old.  3. Left circumflex:  Occluded proximally.  4. Right coronary:  Occluded proximally.  5. Vein graft to the right coronary:  Patent through the side branch Y      limb.  The continuation of the vein graft was occluded.  6. Vein graft to the circumflex marginal:  Occluded at the aorta.  7. LIMA to LAD:  Widely patent.  8. Stent to the left vertebral:  Widely patent.   LEFT VENTRICULOGRAPHY:  RAO and LAO left ventriculograms were performed  using 20 mL of Visipaque  dye at 10 mL per second.  There was mild to  inferobasal hypokinesia.  EF was estimated at approximately 55%.   IMPRESSION:  [name] has had occlusion of his native circumflex and  circumflex marginal obtuse marginal, circumflex marginal vein graft  since his prior catheterization June 24, 2004.  This is responsible for  his increased dyspnea and his inferolateral abnormality of Myoview  stress testing.  I do not think that there is a suitable percutaneous  solution to his anatomy, given the diffuse nature of his native  circumflex.  Continued medical therapy will be recommended.   The sheath was removed and pressure was held on the groin to achieve  hemostasis.  The patient left the lab in stable condition.  He will  continue his bicarb drip and his renal function will be assessed in the  morning.  He will be discharged home if he remains clinically stable.      Nanetta Batty, M.D.  Electronically Signed     JB/MEDQ  D:  05/29/2006  T:  05/29/2006  Job:   [id number]   cc:   2nd floor [facility] cardiac cath lab  Edward L. Juanetta Gosling, M.D.

## 2010-07-15 NOTE — Op Note (Signed)
NAME:  [name], [name]                         ACCOUNT NO.:  [id number]   MEDICAL RECORD NO.:  [id number]                   PATIENT TYPE:  INP   LOCATION:  3308                                 FACILITY:  [facility]   PHYSICIAN:  Ines Bloomer, M.D.              DATE OF BIRTH:  30-Aug-1933   DATE OF PROCEDURE:  DATE OF DISCHARGE:                                 OPERATIVE REPORT   PREOPERATIVE DIAGNOSES:  1.  Right lower lobe enlarging lesion.  2.  Right upper lobe lesion.   OPERATION:  Right VATS, wedge right upper lobe, right lower lobe and  minithoracotomy.   SURGEON:  Ines Bloomer, M.D.   FIRST ASSISTANT:  Zenaida Niece, R.N.F.A.   ANESTHESIA:  General anesthesia.   This patient had an enlarging right lower lobe lesion up to 9 mm, had a  history of smoking and was thought to do a biopsy.  It was too small to do  with needle biopsy and because of his size, he was brought to the operating  room for a VATS biopsy.   After general anesthesia and percutaneous insertion of all monitoring lines,  he was turned to the right lateral thoracotomy position.  He was prepped and  draped in the usual sterile manner.  Two trocar sites were made in the  anterior and posterior axillary line at the seventh intercostal space.  Two  trocars were inserted.  The 30 degree scope was inserted.  Pictures were  taken of the lung.  The patient had some areas of some scarring but I could  not see any obvious lesions of the __________  .  A 5 to 7 cm incision was  made over the fifth intercostal space at the mid axillary line.  Latissimus  was partially divided.  The serratus was split.  Small Tuffier was placed in  the interspace.  The lesion was palpated in the posterior basilar segment of  the right lower lobe and then wedged out with four applications of EZ-45  stapler.  There appeared to be an intrapulmonary lymph node.  Another small  area was palpated in the posterior segment of the right  upper lobe and this  was wedged out also.  The staple line was sealed with ProSeal in the usual  fashion.  Two chest tubes were brought into through the trocar sites and  tied in place with 0 silk.  A #7 lymph node was biopsied and sent for  analysis.  A frozen section revealed an __________  pulmonary  lymph node.  The chest was closed with two pericostals.  The on-cue  catheters were placed in the usual fashion.  There was #1 Vicryl in the  muscle layer, 2-0 Vicryl in the subcutaneous tissue and Dermabond for the  skin.  The patient returned to the recovery room in stable condition.  Ines Bloomer, M.D.    DPB/MEDQ  D:  10/27/2003  T:  10/27/2003  Job:  [id number]   cc:   Ramon Dredge L. Juanetta Gosling, M.D.  [address]  [place]  [place] [address]  Fax: [phone]

## 2010-07-15 NOTE — H&P (Signed)
[name], [name]               ACCOUNT NO.:  [id number]   MEDICAL RECORD NO.:  [id number]          PATIENT TYPE:  AMB   LOCATION:  DAY                           FACILITY:  [facility]   PHYSICIAN:  Jerolyn Shin C. Katrinka Blazing, M.D.   DATE OF BIRTH:  02/02/1934   DATE OF ADMISSION:  DATE OF DISCHARGE:  LH                                HISTORY & PHYSICAL   HISTORY OF PRESENT ILLNESS:  A 77 year old male with a history of recurrent  episodes of nausea for about 1 month.  He has had occasional episodes of  diarrhea.  No vomiting.  Evaluation reveals multiple gallstones.  The  patient is scheduled to have cholecystectomy.   PAST HISTORY:  1.  Atherosclerotic heart disease.  2.  Diabetes mellitus.  3.  Hypertension.  4.  Stroke.  5.  Hypothyroidism.  6.  Gastroesophageal reflux disease.  7.  Diverticulosis.  8.  Osteoarthritis.   ALLERGIES:  DEMEROL.   SURGERY:  1.  Coronary artery bypass graft.  2.  Lumbar disk surgery.  3.  Lung resection.   MEDICATIONS:  1.  Omeprazole 20 mg twice daily.  2.  Allopurinol 100 mg daily.  3.  Levothyroxine 0.15 mg daily.  4.  Metoprolol 25 mg daily.  5.  Plavix 75 mg daily.  6.  Isosorbide 30 mg daily.  7.  __________ 30 mg twice daily.  8.  Losartan 50 mg daily.  9.  Felodipine 10 mg daily.  10. Glipizide 10 mg twice daily.  11. Metformin 500 mg two tabs twice daily.  12. Avandia 8 mg daily.  13. Zocor 80 mg daily.  14. Zetia 10 mg daily.  15. Niacin 500 mg daily.  16. Aspirin 81 mg daily.  17. Potassium chloride 20 mEq daily.  18. Flexeril 10 mg p.r.n.  19. Lasix 40 mg twice daily.  20. Travoprost eye drops, one drop each eye at bedtime.   PHYSICAL EXAMINATION:  VITAL SIGNS:  Blood pressure 140/68, pulse 62,  respirations 24, weight 278 pounds.  HEENT:  Unremarkable.  NECK:  Supple.  No JVD, bruit, adenopathy, or thyromegaly.  CHEST:  Clear to auscultation.  HEART:  Regular rate and rhythm without murmur, gallop, or rub.  ABDOMEN:  Soft,  nontender.  No masses.  EXTREMITIES:  No clubbing, cyanosis, or edema.  NEUROLOGIC:  No focal motor, sensory, or cerebellar deficits.   IMPRESSION:  1.  Cholelithiasis, cholecystitis.  2.  Diabetes mellitus.  3.  Hypertension.  4.  Atherosclerotic heart disease.   PLAN:  Laparoscopic cholecystectomy.      [name], M.D.  Electronically Signed     LCS/MEDQ  D:  09/29/2004  T:  09/30/2004  Job:  [id number]   cc:   Ramon Dredge L. Juanetta Gosling, M.D.  [address]  [place]  [place] [address]  Fax: [phone]

## 2010-07-15 NOTE — Discharge Summary (Signed)
NAME:  [name], [name]                         ACCOUNT NO.:  [id number]   MEDICAL RECORD NO.:  [id number]                   PATIENT TYPE:  INP   LOCATION:  2029                                 FACILITY:  [facility]   PHYSICIAN:  Nanetta Batty, M.D.                DATE OF BIRTH:  [birth year]   DATE OF ADMISSION:  05/28/2002  DATE OF DISCHARGE:  06/05/2002                                 DISCHARGE SUMMARY   DISCHARGE DIAGNOSES:  1. Exertional chest pain, resolved.  2. Known coronary artery disease, status post coronary artery bypass graft,     status post cardiac catheterization with percutaneous transluminal     coronary angiography and stenting to the right coronary artery.  3. Diabetes mellitus, poor control.  4. Acute on chronic renal failure due to contrast dye load.  5. Hypertension.  6. Obesity.  7. Treated hyperlipidemia.  8. Treated hypothyroidism.  9. Peripheral vascular disease with left vertebral artery angioplasty in     1998.   HISTORY OF PRESENT ILLNESS:  The patient is a 77 year old Caucasian  gentleman who was seen in the office on May 28, 2002, with complaint of  exertional chest pain.  With his prior history of coronary artery disease  and CABG in the past and comorbid conditions, such as diabetes mellitus, the  decision was made to admit him to the hospital for definitive diagnosis by  cardiac catheterization.   HOSPITAL COURSE:  The patient was admitted on rule out MI protocol.  His  enzymes were negative.   His baseline creatinine on the day of admission was 1.6 and blood pressure  was elevated at 162/80.   Cardiac catheterization was performed on May 31, 2002, by Nanetta Batty,  M.D., and it showed coronary artery disease with 70% of LAD stenosis  proximally followed by 95% focal mid stenosis.  The right coronary artery  had a 90% proximal stenosis, and 70% distal occlusion.  The left circumflex  ostia was occluded in the proximal portion.  His graft to  the RCA was  totally occluded at the distal anastomoses.  Grafts to the circumflex were  widely patent.  LIMA to the LAD was widely patent.  Also, vertebral stent  was subselectively visualized and it had 80% ostial stenosis, unchanged from  the prior angiogram four years ago.  The ejection fraction was approximately  estimated at 45%.   Prior to that day, the patient had only diagnostic catheterizations and Dr.  Allyson Sabal decided to discuss the options future planned medical therapy versus  PCI to the SVG versus PCI to the native RCA.  The patient was kept on IV  heparin post catheterization.   On May 30, 2002, Nicki Guadalajara, M.D., performed percutaneous coronary  intervention.  The patient had a Cypher stent placed into the proximal  section of RCA and Taxus stent into the mid portion and an angioplasty was  done to  the distally occluded portion of the RCA.   The patient tolerated the procedure well and was transferred back to the  unit in stable condition.   The next morning, his BNP revealed an elevated creatinine level likely  secondary to contrast dye load and it was 2.1.  Altace was placed on hold.  The patient was off of his Glucophage.  He had hydration therapy and  gradually the creatinine came to baseline, even slightly low at 1.4.   HOSPITAL CONSULTS:  None.   HOSPITAL LABORATORY DATA AND PERTINENT STUDIES:  BNP at the time of  discharge showed potassium 3.5 which was repleted, sodium 141, chloride 105,  CO2 30, BUN 6, creatinine 1.4, and glucose 156.  The CBC showed a white  blood cell count of 7.1, hemoglobin 10.5, hematocrit 30.8, and platelet  count 253.  His anemia was evaluated and it showed a normal folate level and  the B12 level was slightly low at 207 with the normal range between 211-911.  The folate level was within normal range at 464.   As I mentioned, his cardiac enzymes were negative.  Liver function tests  were negative.  The patient __________ his  admission, except his blood  pressure was elevated.  We started him on beta blocker and Altace was  started for blood pressure control.  His creatinine came down to the 1.4  range.   CONDITION ON DISCHARGE:  Stable.   ACTIVITY:  Avoid exertional activity for two to three days.   DIET:  Stay on a low-salt, low-fat, low-carb diet.   WOUND CARE:  He is allowed to shower.  Instructed to report any problems  with his groin site.  Dr. __________ number was provided.   DISCHARGE FOLLOW-UP:  ___________ scheduled on Jun 30, 2002, at 3:30 p.m.   DISCHARGE MEDICATIONS:  1. Plavix 75 mg daily.  2. Aspirin 81 mg daily.  3. Synthroid 0.150 mg daily.  4. Glucophage 1000 mg b.i.d.  5. Glucotrol 10 mg b.i.d.  6. Isordil 20 mg b.i.d.  7. Protonix 40 mg daily.  8. Allopurinol 150 mg daily.  9. Norvasc 10 mg daily.  The dose was increased.  10.      Niferex 150 mg b.i.d.  11.      Coreg 3.125 mg b.i.d.  The dose will be titrate up when the patient     is seen in the office by Nanetta Batty, M.D., if he tolerates it well.     If note, it can be changed to Toprol XL initially.  Initially the order     was Toprol XL 25 mg daily, but I doubt that the dose was ever given to     the patient and Cristy Hilts. Jacinto Halim, M.D., prescribed Coreg.  12.      Altace 2.5 mg daily.     Raymon Mutton, P.A.                    Nanetta Batty, M.D.    MK/MEDQ  D:  06/05/2002  T:  06/06/2002  Job:  [id number]

## 2010-07-15 NOTE — Procedures (Signed)
   NAME:  [name], [name]                         ACCOUNT NO.:  [id number]   MEDICAL RECORD NO.:  [id number]                   PATIENT TYPE:  EMS   LOCATION:  ED                                   FACILITY:  [facility]   PHYSICIAN:  Edward L. Juanetta Gosling, M.D.             DATE OF BIRTH:  1933-10-12   DATE OF PROCEDURE:  04/04/2002  DATE OF DISCHARGE:                                EKG INTERPRETATION   FINDINGS:  The rhythm is sinus rhythm with a bradycardic rate in the 50s.                                               Edward L. Juanetta Gosling, M.D.    ELH/MEDQ  D:  04/07/2002  T:  04/07/2002  Job:  [id number]

## 2010-07-15 NOTE — Discharge Summary (Signed)
[name], [name]               ACCOUNT NO.:  [id number]   MEDICAL RECORD NO.:  [id number]          PATIENT TYPE:  INP   LOCATION:  3739                         FACILITY:  [facility]   PHYSICIAN:  Nanetta Batty, M.D.   DATE OF BIRTH:  10-25-1933   DATE OF ADMISSION:  06/23/2004  DATE OF DISCHARGE:  06/25/2004                                 DISCHARGE SUMMARY   DISCHARGE DIAGNOSES:  1.  Unstable angina.  2.  Coronary artery disease with coronary artery bypass grafting in 1988,      right coronary artery percutaneous coronary intervention in 2004, and      recent saphenous vein graft to circumflex Cypher stenting May 17, 2004, with restudy this admission, plan is for medical therapy.  3.  Noninsulin-dependent diabetes.  4.  Video-assisted thoracoscopic surgery, August 2005, showing no      malignancy, on steroids.  5.  Hypertension.  6.  Treated hyperlipidemia  7.  Gout.  8.  History of transient ischemic attack.   HOSPITAL COURSE:  The patient is a 77 year old male with a long history of  coronary disease.  He had bypass surgery in 1988 and an RCA intervention in  2004.  He was recently discharged after a Cypher stent placed to the SVG to  the circumflex.  On the day prior to admission, he noted recurrence of neck  pain and jaw pain similar his pre-PCI symptoms.  He improved with  nitroglycerin.  He was seen in the emergency room June 23, 2004, and  admitted for further evaluation.  He was started on IV heparin.  A  catheterization done on June 24, 2004.  This revealed patent distal RCA  stents, patent proximal circumflex stent, and a patent LIMA to the LAD with  80-90% distal LAD stenosis, with normal LV function.  Plan is for continued  medical therapy.  He is discharged June 25, 2004.   LABORATORY DATA:  Chest x-ray shows scarring in the right lung.  EKG shows  sinus rhythm with inferior T-wave inversion.  White count 11.6, hemoglobin  10.3, hematocrit 29.8,  platelets 268.  INR 0.9.  Sodium 139, potassium 5.2,  BUN 24, creatinine 1.3.  LFTs were normal.  CK-MB and troponins were  negative.  TSH 1.04.  A urinalysis did show some proteinuria.   DISPOSITION:  The patient is discharged in stable condition and will follow  up with [name].   DISCHARGE MEDICATIONS:  1.  Plavix 75 mg a day.  2.  Aspirin 81 mg a day.  3.  Toprol XL 25 mg a day.  4.  Losartan 50 mg a day.  5.  Felodipine 10 mg a day.  6.  Imdur 30 mg a day.  7.  Zocor 80 mg a day.  8.  Niaspan 500 mg h.s.  9.  HCTZ 12.5 mg a day.  10. Prednisone as directed.  11. Allopurinol 300 mg a day.  12. Levothyroxine 0.15 mg a day.  13. Glipizide 10 mg twice a day.  14. Glucophage will be held and resumed the Monday  after discharge.  15. Avandia 8 mg a day.  16. Nitroglycerin p.r.n.      [name], P.A.      Nanetta Batty, M.D.  Electronically Signed    LKK/MEDQ  D:  09/22/2004  T:  09/22/2004  Job:  [id number]   cc:   Ramon Dredge L. Juanetta Gosling, M.D.  [address]  [place]  [place] [address]  Fax: [phone]

## 2010-07-15 NOTE — Op Note (Signed)
NAME:  [name], [name]               ACCOUNT NO.:  [id number]   MEDICAL RECORD NO.:  [id number]          PATIENT TYPE:  AMB   LOCATION:  DAY                           FACILITY:  [facility]   PHYSICIAN:  R. Roetta Sessions, M.D. DATE OF BIRTH:  03-Oct-1933   DATE OF PROCEDURE:  08/09/2004  DATE OF DISCHARGE:                                 OPERATIVE REPORT   PROCEDURE PERFORMED:  Screening colonoscopy.   INDICATIONS FOR PROCEDURE:  The patient is a 77 year old gentleman devoid of  any lower GI tract symptoms referred for colorectal cancer screening.  He  has never had a colonoscopy.  There is no family history of colorectal  neoplasia.  He has recently been noted to be anemic, but he tells me that  was related to a significant femoral artery bleed from cardiac  catheterization back in March.  There is no history of GI bleeding.   Colonoscopy is now being done as a screening maneuver.  This approach has  been discussed with the patient at length.  Potential risks, benefits and  alternatives have been reviewed, questions have been answered, he is  agreeable.  Please see documentation in the medical records.   PROCEDURE NOTE:  Oxygen saturations, blood pressure, pulse and respirations  were monitored throughout the entire procedure.  Conscious sedation Versed 3  mg IV, fentanyl 50 mcg IV in divided doses.   INSTRUMENT USED:  Olympus video chip system.   FINDINGS:  Digital exam revealed no abnormalities.   ENDOSCOPIC FINDINGS:  Prep was adequate.   Rectum:  Examination of the rectal mucosa including retroflex view of the  anal verge revealed no abnormalities.  Colon:  Colonic mucosa was surveyed from the rectosigmoid junction to the  left, transverse and right colon to the area of the appendiceal orifice and  ileocecal valve and cecum.  These structures were well seen and photographed  for the record.  From this level, the scope was slowly withdrawn.  All  previously mentioned mucosal  surfaces were again seen.  Patient noted to  have shallow left-sided diverticula.  Remainder of colonic mucosa appeared  normal.  The patient tolerated the procedure well, was reacted in endoscopy.   IMPRESSION:  1.  Normal rectum.  2.  Sigmoid diverticula.  Remainder of colonic mucosa appeared normal.   RECOMMENDATIONS:  1.  Diverticulosis literature provided to Mr. Strollo.  2.  Repeat colonoscopy in 10 years.       RMR/MEDQ  D:  08/09/2004  T:  08/09/2004  Job:  [id number]   cc:   Ramon Dredge L. Juanetta Gosling, M.D.  [address]  [place]  [place] [address]  Fax: [phone]

## 2010-07-15 NOTE — Discharge Summary (Signed)
NAME:  [name], [name]                         ACCOUNT NO.:  [id number]   MEDICAL RECORD NO.:  [id number]                   PATIENT TYPE:  INP   LOCATION:  3308                                 FACILITY:  [facility]   PHYSICIAN:  Ines Bloomer, M.D.              DATE OF BIRTH:  1934-01-26   DATE OF ADMISSION:  10/27/2003  DATE OF DISCHARGE:  11/03/2003                                 DISCHARGE SUMMARY   HISTORY OF PRESENT ILLNESS:  The patient is a very pleasant 77 year old male  who was referred from Dr. Juanetta Gosling for evaluation of enlarging pulmonary  nodule of the right lung.  The patient had a CT scan of the chest three  months ago which revealed small right lung nodules.  The largest of these  was 7 mm in the right lower lobe.  A repeat CT scan showed the nodule to  have increased in size to 9 mm. He was referred to Dr. Edwyna Shell for  evaluation.  The patient was recommended surgical resection of the nodule.  The patient has a known history of coronary artery disease. He was  recommended a cardiology work up prior to proceeding with surgery including  Cardiolite study.  The results were reviewed by Dr. Edwyna Shell and the patient  was felt to be a candidate for proceeding with the lung resection.   PAST MEDICAL HISTORY:  1.  Coronary artery disease, status post coronary artery bypass grafting in      1998 by Dr. Particia Lather.  2.  Dyslipidemia.  3.  Diabetes mellitus, type 2.  4.  Hypothyroidism.  5.  Benign prostatic hypertrophy.  6.  Nephrolithiasis, status post lithotripsy 4 to 5 years ago by Dr. Logan Bores.      Last episode of nephrolithiasis was May 2005 and was treated with an      antibiotic.  7.  Patient is status post vertebral artery stenting in [place].  8.  Back surgery in 1987.  9.  Cataract and laser surgery bilateral eyes.   ALLERGIES:  No known drug allergies, although he is INTOLERANT to DEMEROL  which causes nausea and vomiting.   MEDICATIONS PRIOR TO ADMISSION:  1.  Irbesartan 150 mg daily.  2.  Isosorbide 30 mg b.i.d.  3.  Metoprolol 25 mg daily.  4.  Simvastatin 80 mg daily.  5.  Levothyroxine 0.15 mg daily.  6.  Rosiglitazone 8 mg daily.  7.  Glipizide 10 mg b.i.d.  8.  Metformin 1,000 mg b.i.d.  9.  Plavix 75 mg daily.  10. Allopurinol 300 mg daily.  11. Omeprazole 20 mg b.i.d.  12. Aspirin 81 mg daily.   SOCIAL HISTORY, FAMILY HISTORY, REVIEW OF SYSTEMS, PHYSICAL EXAMINATION:  Please see the history and physical done at the time of admission.   HOSPITAL COURSE:  The patient was admitted electively on October 27, 2003 and  taken to the operating room where he underwent  the following procedure:  Right video-assisted thoracoscopy, wedge right upper lobe, right lower lobe  and mini thoracotomy.  The procedure was performed by Dr. Jovita Gamma.  The patient tolerated the procedure well and was taken to the post  anesthesia care unit in stable condition.   Postoperatively the patient's hospital course was remarkable for post  procedure elevation of his cardiac enzymes both CK-MB as well as troponin-I.  His electrocardiogram also had an apparent new Q wave in lead III.  He was  monitored closely throughout the hospitalization by the cardiology service.  His enzymes did decrease.  The peak troponin was 4.2., peak CK-MB's were  14.2.  Peak total CK's were 1,578.  The patient was treated with some  diuretic.  All routine lines, monitors and drainage devices were  discontinued in the standard fashion.  He has maintained stable  hemodynamic's.  He maintains a normal sinus rhythm.  His incision was  healing well without signs of infection.  From a pathology view point the  report initially shows multiple findings.  Please see the detailed report  for complete dictation.  In view there is some evidence of organizing  pneumonitis and focal diffuse alveolar damage and interstitial lymphocytic  infiltrate.  There was also rare noncaseating granuloma  associated with  refringent foreign material.  There was also emphysematous changes but no  atypia or evidence of malignancy identified.  Also, a lymph node at level 7  was negative for tumor.  The patient was evaluated on November 03, 2003 and  overall deemed to be ready for discharge at that time.   DISCHARGE MEDICATIONS:  1.  Omeprazole 20 mg twice daily as per home.  2.  Allopurinol 300 mg daily as per home.  3.  Synthroid 150 mcg daily as per home.  4.  Plavix 75 mg daily as per home.  5.  Aspirin 81 mg daily as per home.  6.  Avapro 100 mg daily, same as at home.  7.  Simvastatin 80 mg as previously at home.  8.  Lopressor 50 mg twice daily which is a new dose.  9.  Isosorbide 30 mg twice daily, same as at home.  10. Glucotrol 20 mg b.i.d., same as home.  11. Avandia 8 mg daily, same as home.  12. Glucophage 1,000 mg twice daily, same as at home.  13. For pain he can take Tylox one to two every 6 hours as needed.   DISCHARGE INSTRUCTIONS:  Patient received written instructions in regard to  medications, activity, diet, wound care, follow up.   FOLLOW UP:  Follow up is to include with [name] on November 13, 2003 at  4:00 P.M.  Also Cardiolite was scheduled for  Monday, November 09, 2003 at  10:45 through [facility].  Dr. Scheryl Darter office  will also call the patient to see him in one week in follow up.   FINAL DIAGNOSES:  1.  Pulmonary lesions in right lung as described with no evidence of      malignancy or atypia.  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  2.  Other diagnoses include possible subendocardial myocardial infarction      postoperatively, indeterminate based on cardiology review.  He will      continue medical therapy and follow up as an outpatient for further      testing.  3.  Other diagnoses as previously listed.  Rowe Clack, P.A.-C.                    Ines Bloomer, M.D.   Sherryll Burger  D:   11/03/2003  T:  11/03/2003  Job:  [id number]   cc:   Ramon Dredge L. Juanetta Gosling, M.D.  [address]  [place]  [place] [address]  Fax: [phone]   Nanetta Batty, M.D.  Fax: [phone]

## 2010-07-28 ENCOUNTER — Ambulatory Visit (INDEPENDENT_AMBULATORY_CARE_PROVIDER_SITE_OTHER): Payer: Medicare Other | Admitting: Otolaryngology

## 2010-07-28 DIAGNOSIS — H902 Conductive hearing loss, unspecified: Secondary | ICD-10-CM

## 2010-07-28 DIAGNOSIS — H6120 Impacted cerumen, unspecified ear: Secondary | ICD-10-CM

## 2010-08-18 ENCOUNTER — Emergency Department (HOSPITAL_COMMUNITY)
Admission: EM | Admit: 2010-08-18 | Discharge: 2010-08-18 | Disposition: A | Discharge status: Home / Self Care | Payer: Medicare Other | Attending: Emergency Medicine | Admitting: Emergency Medicine

## 2010-08-18 ENCOUNTER — Emergency Department (HOSPITAL_COMMUNITY): Payer: Medicare Other

## 2010-08-18 ENCOUNTER — Ambulatory Visit (INDEPENDENT_AMBULATORY_CARE_PROVIDER_SITE_OTHER): Payer: Medicare Other | Admitting: Otolaryngology

## 2010-08-18 DIAGNOSIS — E7800 Pure hypercholesterolemia, unspecified: Secondary | ICD-10-CM | POA: Insufficient documentation

## 2010-08-18 DIAGNOSIS — S93409A Sprain of unspecified ligament of unspecified ankle, initial encounter: Secondary | ICD-10-CM | POA: Insufficient documentation

## 2010-08-18 DIAGNOSIS — IMO0002 Reserved for concepts with insufficient information to code with codable children: Secondary | ICD-10-CM | POA: Insufficient documentation

## 2010-08-18 DIAGNOSIS — I10 Essential (primary) hypertension: Secondary | ICD-10-CM | POA: Insufficient documentation

## 2010-08-18 DIAGNOSIS — I509 Heart failure, unspecified: Secondary | ICD-10-CM | POA: Insufficient documentation

## 2010-08-18 DIAGNOSIS — E039 Hypothyroidism, unspecified: Secondary | ICD-10-CM | POA: Insufficient documentation

## 2010-08-18 DIAGNOSIS — M549 Dorsalgia, unspecified: Secondary | ICD-10-CM | POA: Insufficient documentation

## 2010-08-18 DIAGNOSIS — I252 Old myocardial infarction: Secondary | ICD-10-CM | POA: Insufficient documentation

## 2010-08-18 DIAGNOSIS — E119 Type 2 diabetes mellitus without complications: Secondary | ICD-10-CM | POA: Insufficient documentation

## 2010-08-18 DIAGNOSIS — I2510 Atherosclerotic heart disease of native coronary artery without angina pectoris: Secondary | ICD-10-CM | POA: Insufficient documentation

## 2010-08-18 DIAGNOSIS — H66019 Acute suppurative otitis media with spontaneous rupture of ear drum, unspecified ear: Secondary | ICD-10-CM

## 2010-08-18 DIAGNOSIS — X500XXA Overexertion from strenuous movement or load, initial encounter: Secondary | ICD-10-CM | POA: Insufficient documentation

## 2010-09-01 ENCOUNTER — Ambulatory Visit (INDEPENDENT_AMBULATORY_CARE_PROVIDER_SITE_OTHER): Payer: Medicare Other | Admitting: Otolaryngology

## 2010-09-01 ENCOUNTER — Other Ambulatory Visit (HOSPITAL_COMMUNITY): Payer: Self-pay | Admitting: Pulmonary Disease

## 2010-09-01 ENCOUNTER — Ambulatory Visit (HOSPITAL_COMMUNITY)
Admission: RE | Admit: 2010-09-01 | Discharge: 2010-09-01 | Disposition: A | Discharge status: Home / Self Care | Payer: Medicare Other | Source: Ambulatory Visit | Attending: Pulmonary Disease | Admitting: Pulmonary Disease

## 2010-09-01 DIAGNOSIS — W19XXXA Unspecified fall, initial encounter: Secondary | ICD-10-CM

## 2010-09-01 DIAGNOSIS — R0602 Shortness of breath: Secondary | ICD-10-CM | POA: Insufficient documentation

## 2010-09-01 DIAGNOSIS — H66019 Acute suppurative otitis media with spontaneous rupture of ear drum, unspecified ear: Secondary | ICD-10-CM

## 2010-09-01 DIAGNOSIS — M25559 Pain in unspecified hip: Secondary | ICD-10-CM | POA: Insufficient documentation

## 2010-09-01 DIAGNOSIS — R05 Cough: Secondary | ICD-10-CM | POA: Insufficient documentation

## 2010-09-01 DIAGNOSIS — R059 Cough, unspecified: Secondary | ICD-10-CM | POA: Insufficient documentation

## 2010-09-01 DIAGNOSIS — S79919A Unspecified injury of unspecified hip, initial encounter: Secondary | ICD-10-CM | POA: Insufficient documentation

## 2010-10-20 ENCOUNTER — Encounter: Payer: Self-pay | Admitting: Orthopedic Surgery

## 2010-10-20 ENCOUNTER — Ambulatory Visit (INDEPENDENT_AMBULATORY_CARE_PROVIDER_SITE_OTHER): Payer: Medicare Other | Admitting: Orthopedic Surgery

## 2010-10-20 DIAGNOSIS — M549 Dorsalgia, unspecified: Secondary | ICD-10-CM

## 2010-10-20 NOTE — Progress Notes (Signed)
Chief complaint: RIGHT hip pain HPI:(4) I have a 77 year old male had lumbar disc procedure in 1987 was doing well in his usual state of health until he twisted his ankle.  During the treatment he started to limp in about 2 weeks afterwards started having pain in his RIGHT hip.  (Which is actually in the RIGHT lower back area).  He has minimal radicular symptoms at this time.  The pain is sharp it's 5 or 6/10.  It tends to come and go.  He has some mild numbness and tingling in the RIGHT leg.  It's worse with walking and standing.  The pain is not temporally related.  ROS:(2) Shortness of breath and cough, diarrhea, poor healing of the skin, unsteady gait with tremors, easy bruising.  PFSH: (1)  Past Medical History  Diagnosis Date  . Heart attack   . Diabetes mellitus type II    Past Surgical History  Procedure Date  . Gallbladder surgery   . Back surgery   . Heart stents   . Heart bypass   . Finger surgery right little     Physical Exam(12) GENERAL: normal development   CDV: pulses are normal   Skin: normal  Lymph: nodes were not palpable/normal  Psychiatric: awake, alert and oriented  Neuro: normal sensation  MSK He is ambulating with a cane and a slightly forward pitched posture 1 Normal range of motion both hips. 2 No limb length discrepancy.  Normal muscle tone both legs. 3 Tenderness in the middle of his lumbar spine at the L4 level LV level and S1 level 4 Tenderness on the RIGHT side of the lower back and iliac crest 5 Negative straight leg raise in a seated position  Imaging: We have several image reports.  First image MRI dated December of 2010 there is some lateral recess stenosis at L1 to previous RIGHT laminotomy L3 and 4 LEFT laminotomy L4 and 5 bulging disc at L5 and S1 multiple levels of degenerative disc changes  RIGHT hip dated September 01, 2010 no abnormality.  [facility] has a RIGHT hip MRI dated August 2012 there is bilateral hip joint arthrosis not seen  on plain film and suspected torn RIGHT labrum.  On July 5 of this year had a complete RIGHT hip series showed no hip fracture no joint space narrowing and no osteophytes.    Assessment: Back pain brought on by limping secondary to the ankle injury    Plan: As he is not having any serious radicular symptoms recommend physical therapy come back one month if no improvement arrange epidurals

## 2010-10-26 ENCOUNTER — Ambulatory Visit (HOSPITAL_COMMUNITY)
Admission: RE | Admit: 2010-10-26 | Discharge: 2010-10-26 | Disposition: A | Discharge status: Home / Self Care | Payer: Medicare Other | Source: Ambulatory Visit | Attending: Orthopedic Surgery | Admitting: Orthopedic Surgery

## 2010-10-26 DIAGNOSIS — IMO0001 Reserved for inherently not codable concepts without codable children: Secondary | ICD-10-CM | POA: Insufficient documentation

## 2010-10-26 DIAGNOSIS — R262 Difficulty in walking, not elsewhere classified: Secondary | ICD-10-CM | POA: Insufficient documentation

## 2010-10-26 DIAGNOSIS — M2560 Stiffness of unspecified joint, not elsewhere classified: Secondary | ICD-10-CM | POA: Insufficient documentation

## 2010-10-26 DIAGNOSIS — M25559 Pain in unspecified hip: Secondary | ICD-10-CM | POA: Insufficient documentation

## 2010-10-26 DIAGNOSIS — R29898 Other symptoms and signs involving the musculoskeletal system: Secondary | ICD-10-CM | POA: Insufficient documentation

## 2010-10-26 DIAGNOSIS — M545 Low back pain, unspecified: Secondary | ICD-10-CM | POA: Insufficient documentation

## 2010-10-26 DIAGNOSIS — E119 Type 2 diabetes mellitus without complications: Secondary | ICD-10-CM | POA: Insufficient documentation

## 2010-10-26 NOTE — Progress Notes (Signed)
Physical Therapy Evaluation  Patient Details  Name: [name] MRN: [id number] Date of Birth: Oct 28, 1933  Today's Date: 10/26/2010 Time: 0152-0233 Time Calculation (min): 41 min Visit#: 1 of 18 Re-eval: 11/23/10  Past Medical History:  Past Medical History  Diagnosis Date  . Heart attack   . Diabetes mellitus type II    Past Surgical History:  Past Surgical History  Procedure Date  . Gallbladder surgery   . Back surgery   . Heart stents   . Heart bypass   . Finger surgery right little    Subjective Symptoms/Limitations Symptoms: [name] state that he has had low back pain since 1987 after he fell off a loading dock.  He had surgery and went on disability.  After the surgery he did well until eight weeks ago when he fell in a hole in his yard and sprained his ankle.  He states he feel as if he was walking differently .  Four to five weeks after this he started having right hip  pain.  The patient had x-ray and MRI and the MD stated that his problem was in his back and not in his hip.  He is being referred to physical therapy at this time  to improve his functional mobility and quality of life. How long can you sit comfortably?: varies  How long can you stand comfortably?: Pt states he is able to stand for five minutes How long can you walk comfortably?: If it's hurting no longer than five minutes Pain Assessment Currently in Pain?: No/denies Pain Score:   2 (Pain will get up to an) Pain Location: Leg Pain Orientation: Right Pain Type: Chronic pain Pain Radiating Towards: towards the foot. Pain Onset: More than a month ago Pain Frequency: Intermittent Pain Relieving Factors: I just need to get into the right position. Effect of Pain on Daily Activities: no change  Precautions/Restrictions   none  Prior Functioning  Home Living Type of Home: House Lives With: Spouse Receives Help From: Family Home Layout: One level Home Access: Stairs to enter Entrance  Stairs-Rails: Right Entrance Stairs-Number of Steps: 2 Bathroom Shower/Tub: Engineer, manufacturing systems: Standard Prior Function Level of Independence: Independent with basic ADLs Driving: Yes Able to Take Stairs Reciprically: Yes Vocation: On disability Leisure: Hobbies-yes (Comment) Comments: dancing  Cognition Cognition Overall Cognitive Status: Appears within functional limits for tasks assessed Arousal/Alertness: Awake/alert Orientation Level: Oriented X4  Sensation/Coordination/Flexibility  Pt has increased kyphosis decreased lordosis and protruding abdominal mm.    Assessment RLE Strength Right Hip Flexion: 5/5 Right Hip Extension: 3+/5 Right Hip ABduction: 4/5 Right Hip ADduction: 4/5 Right Knee Flexion: 5/5 Right Knee Extension: 5/5 Right Ankle Dorsiflexion: 3/5 Right Ankle Plantar Flexion: 4/5 LLE Strength Left Hip Flexion: 5/5 Left Hip Extension: 3+/5 Left Hip ABduction: 4/5 Left Hip ADduction: 4/5 Left Knee Flexion: 5/5 Left Knee Extension: 5/5 Left Ankle Dorsiflexion: 3/5 Left Ankle Plantar Flexion: 4/5 Lumbar AROM Lumbar Flexion:  (decreased 30%) Lumbar Extension:  (decreased 20%) Lumbar - Right Side Bend:  (decreased 20%) Lumbar - Left Side Bend:  (decreased 20%) Lumbar - Right Rotation: wfl Lumbar - Left Rotation: wfl Lumbar Strength Lumbar Flexion: 2/5 Lumbar Extension: 3-/5  Mobility (including Balance)    Pt is using a cane.  He states that he was not using a cane prior to his fall.  Exercise/Treatments Stretches Active Hamstring Stretch: 3 reps;20 seconds Lower Trunk Rotation: 2 reps;10 seconds Lumbar Exercises   Stability Ab Set: 10 reps Straight Leg Raise:  (  hip adductor squeeze 10x) Machine Exercises    Manual Therapy Manual Therapy: Joint mobilization Joint Mobilization: For SI Joint  Physical Therapy Assessment and Plan PT Assessment and Plan Clinical Impression Statement: Pt with SI dysfunction as well as low back  instabiliity causing pain in left hip which is causing difficulty walking.  Pt will benefit from skilled therapy to maximize functional potential and improve quality of life. Rehab Potential: Good PT Frequency: Min 3X/week PT Duration: 6 weeks PT Treatment/Interventions: Therapeutic exercise;Other (comment) (manual techniques and modalites to decrease pain.) PT Plan: Contiue to progress pt in postural correction exercises; assess SI joint.    Goals    Problem List Patient Active Problem List  Diagnoses  . Back pain  . Leg weakness, bilateral  . Stiffness of joints, not elsewhere classified, multiple sites    PT - End of Session Activity Tolerance: Patient tolerated treatment well General Behavior During Session: [facility] for tasks performed Cognition: [facility] for tasks performed   [name],CINDY 10/26/2010, 3:18 PM  Physician Documentation Your signature is required to indicate approval of the treatment plan as stated above.  Please sign and either send electronically or make a copy of this report for your files and return this physician signed original.   Please mark one 1.__approve of plan  2. ___approve of plan with the following conditions.   ______________________________                                                          _____________________ Physician Signature                                                                                                             Date

## 2010-10-26 NOTE — Patient Instructions (Signed)
Pt given HEP

## 2010-11-01 ENCOUNTER — Ambulatory Visit (HOSPITAL_COMMUNITY)
Admission: RE | Admit: 2010-11-01 | Discharge: 2010-11-01 | Disposition: A | Discharge status: Home / Self Care | Payer: Medicare Other | Source: Ambulatory Visit | Attending: Pulmonary Disease | Admitting: Pulmonary Disease

## 2010-11-01 DIAGNOSIS — M545 Low back pain, unspecified: Secondary | ICD-10-CM | POA: Insufficient documentation

## 2010-11-01 DIAGNOSIS — R262 Difficulty in walking, not elsewhere classified: Secondary | ICD-10-CM | POA: Insufficient documentation

## 2010-11-01 DIAGNOSIS — M25559 Pain in unspecified hip: Secondary | ICD-10-CM | POA: Insufficient documentation

## 2010-11-01 DIAGNOSIS — E119 Type 2 diabetes mellitus without complications: Secondary | ICD-10-CM | POA: Insufficient documentation

## 2010-11-01 DIAGNOSIS — IMO0001 Reserved for inherently not codable concepts without codable children: Secondary | ICD-10-CM | POA: Insufficient documentation

## 2010-11-01 NOTE — Progress Notes (Signed)
Physical Therapy Treatment Patient Details  Name: [name] MRN: [id number] Date of Birth: December 04, 1933  Today's Date: 11/01/2010 Time: 4098-1191 Time Calculation (min): 39 min Visit#: 2 of 18 Re-eval: 11/23/10 Charges: Therex x 38'  Subjective: Symptoms/Limitations Symptoms: My R hip and back still hurt. Pain Assessment Currently in Pain?: Yes Pain Score:   5 Pain Location: Back Pain Orientation: Lower Multiple Pain Sites: Yes (R hip 5/10)   Exercise/Treatments Stretches Active Hamstring Stretch: 3 reps;20 seconds Lower Trunk Rotation: 3 reps Stability Clam: 10 reps;Supine Bridge: 10 reps Ab Set: 10 reps Straight Leg Raise: 10 reps;Supine Hip Abduction:  (hip adductor squeeze 10x) Functional Squats: 10 reps Heel Raises: 10 reps  Physical Therapy Assessment and Plan PT Assessment and Plan Clinical Impression Statement: Pt completes new stabilization/strengthening therex with minimal diffuculty. Pt requires multimodal cueing to avoid posterior pelvic tilt with functional squats. PT Treatment/Interventions: Therapeutic exercise PT Plan: Continue to progress core strength to improve posture and decrease pain.     Problem List Patient Active Problem List  Diagnoses  . Back pain  . Leg weakness, bilateral  . Stiffness of joints, not elsewhere classified, multiple sites    General Behavior During Session: [facility] for tasks performed Cognition: [facility] for tasks performed  Antonieta Iba 11/01/2010, 9:42 AM

## 2010-11-02 ENCOUNTER — Ambulatory Visit (HOSPITAL_COMMUNITY)
Admission: RE | Admit: 2010-11-02 | Discharge: 2010-11-02 | Disposition: A | Discharge status: Home / Self Care | Payer: Medicare Other | Source: Ambulatory Visit | Attending: Orthopedic Surgery | Admitting: Orthopedic Surgery

## 2010-11-02 NOTE — Progress Notes (Signed)
Physical Therapy Treatment Patient Details  Name: NNAEMEKA SAMSON MRN: [id number] Date of Birth: [birth year]  Today's Date: 11/02/2010 Time: 7829-5621 Time Calculation (min): 41 min Visit#: 3 of 18 Re-eval: 11/23/10 Charges: Therex x 38'  Subjective: Symptoms/Limitations Symptoms: I felt so much better after last time. Pain Assessment Currently in Pain?: Yes Pain Score:   2 Pain Location: Hip Pain Orientation: Right   Exercise/Treatments Stretches Active Hamstring Stretch: 3 reps;20 seconds Lower Trunk Rotation: 3 reps;30 seconds Stability Clam: 10 reps;Supine Bridge: 15 reps Ab Set: 10 reps Straight Leg Raise: 10 reps;Supine Hip Abduction: Side-lying;10 reps (hip adductor squeeze 10x) Leg Raise: 10 reps (standing hip est) Functional Squats: 10 reps Heel Raises: 10 reps  Physical Therapy Assessment and Plan PT Assessment and Plan Clinical Impression Statement: Pt completes therex with minimal difficulty. Pt requires decreased cueing this tx. Pt completes functional squats correctly and with increased ease this tx. PT Treatment/Interventions: Therapeutic exercise PT Plan: Continue to progress per PT POC.     Problem List Patient Active Problem List  Diagnoses  . Back pain  . Leg weakness, bilateral  . Stiffness of joints, not elsewhere classified, multiple sites    PT - End of Session Activity Tolerance: Patient tolerated treatment well General Behavior During Session: [facility] for tasks performed Cognition: [facility] for tasks performed  Antonieta Iba 11/02/2010, 9:36 AM

## 2010-11-04 ENCOUNTER — Ambulatory Visit (HOSPITAL_COMMUNITY)
Admission: RE | Admit: 2010-11-04 | Discharge: 2010-11-04 | Disposition: A | Discharge status: Home / Self Care | Payer: Medicare Other | Source: Ambulatory Visit

## 2010-11-04 DIAGNOSIS — M2560 Stiffness of unspecified joint, not elsewhere classified: Secondary | ICD-10-CM

## 2010-11-04 DIAGNOSIS — R29898 Other symptoms and signs involving the musculoskeletal system: Secondary | ICD-10-CM

## 2010-11-04 NOTE — Progress Notes (Signed)
Physical Therapy Treatment Patient Details  Name: [name] MRN: [id number] Date of Birth: [birth year]  Today's Date: 11/04/2010 Time: 4098-1191 Time Calculation (min): 45 min Visit#: 4 of 18 Re-eval: 11/23/10 Charge: therex 45 min  Subjective: Symptoms/Limitations Symptoms: 3/10  back pain, felt better after every time I come here. Pain Assessment Currently in Pain?: Yes Pain Score:   2 Pain Location: Back  Precautions/Restrictions     Mobility (including Balance)       Exercise/Treatments  Stretches  Active Hamstring Stretch: 3 reps;20 seconds  Lower Trunk Rotation: 3 reps;30 seconds  Stability  Clam: 15 reps;Supine  Bridge: 20 reps  Ab Set: 15 reps x 5" Straight Leg Raise: 10 reps; 3# Adduction 15x 5" Side-lying: Hip Abduction:;10 reps  Standing:  Functional Squats: 10 reps x 2 Heel Raises: 10 reps x 2 Hip abduction 10 reps Hip extension 10 reps  Physical Therapy Assessment and Plan PT Assessment and Plan Clinical Impression Statement: Pt tolerated tx well without difficullty with min cueing for posture with standing therex.  Able to add 3# with SLR, pt able to complete with visible fatigue.  Able to increase reps/sets with no diff. PT Plan: Progress clam to sidelying next tx.    Goals    Problem List Patient Active Problem List  Diagnoses  . Back pain  . Leg weakness, bilateral  . Stiffness of joints, not elsewhere classified, multiple sites    PT - End of Session Activity Tolerance: Patient tolerated treatment well General Behavior During Session: [facility] for tasks performed Cognition: [facility] for tasks performed  Juel Burrow 11/04/2010, 5:19 PM

## 2010-11-07 ENCOUNTER — Ambulatory Visit (HOSPITAL_COMMUNITY)
Admission: RE | Admit: 2010-11-07 | Discharge: 2010-11-07 | Disposition: A | Discharge status: Home / Self Care | Payer: Medicare Other | Source: Ambulatory Visit | Attending: Orthopedic Surgery | Admitting: Orthopedic Surgery

## 2010-11-07 NOTE — Progress Notes (Signed)
Physical Therapy Treatment Patient Details  Name: [name] MRN: [id number] Date of Birth: 1933-03-16  Today's Date: 11/07/2010 Time: 4098-1191 Time Calculation (min): 41 min Visit#: 5 of 18 Re-eval: 11/23/10 Charges:  therex 38'   Subjective: Symptoms/Limitations Symptoms: 3/10 back pain.  States he went dancing over the weekend; did OK for first 2 hours then moved wrong and began hurting. Pain Assessment Currently in Pain?: Yes Pain Score:   3 Pain Location: Back Pain Orientation: Right   Exercise/Treatments Stretches Active Hamstring Stretch: 3 reps;20 seconds Lower Trunk Rotation: 3 reps;30 seconds Stability  Bridge: 20 reps  Ab Set: 20 reps x 5"  Straight Leg Raise 15 reps 3#  Adduction 20x 5"  Side-lying:  Hip Abduction 15 reps each Clams 10Xeach Standing:  Functional Squats: 20 reps Heel Raises: 20 reps Hip abduction 15 reps each   Hip extension 15 reps each     Physical Therapy Assessment and Plan PT Assessment and Plan Clinical Impression Statement: progressed clams to sidelying with tactile cues for stability.  Slight extension lag noted with R LE performing SLR's. PT Treatment/Interventions: Therapeutic exercise PT Plan: Progress stability therex; progress to ball isometric abdominals next visit.       Problem List Patient Active Problem List  Diagnoses  . Back pain  . Leg weakness, bilateral  . Stiffness of joints, not elsewhere classified, multiple sites    PT - End of Session Activity Tolerance: Patient tolerated treatment well General Behavior During Session: [facility] for tasks performed Cognition: [facility] for tasks performed  Emeline Gins B 11/07/2010, 9:24 AM

## 2010-11-09 ENCOUNTER — Ambulatory Visit (HOSPITAL_COMMUNITY)
Admission: RE | Admit: 2010-11-09 | Discharge: 2010-11-09 | Disposition: A | Discharge status: Home / Self Care | Payer: Medicare Other | Source: Ambulatory Visit | Attending: *Deleted | Admitting: *Deleted

## 2010-11-09 DIAGNOSIS — M2560 Stiffness of unspecified joint, not elsewhere classified: Secondary | ICD-10-CM

## 2010-11-09 DIAGNOSIS — R29898 Other symptoms and signs involving the musculoskeletal system: Secondary | ICD-10-CM

## 2010-11-09 NOTE — Progress Notes (Signed)
Physical Therapy Treatment Patient Details  Name: [name] MRN: [id number] Date of Birth: Dec 26, 1933  Today's Date: 11/09/2010 Time: 3875-6433 (Tx started by Becky Sax, PTA) Time Calculation (min): 45 min Visit#: 6 of 18 Re-eval: 11/23/10 Charges: Therex x 38'   Subjective: Symptoms/Limitations Symptoms: 3/10 R hip/back pain Pain Assessment Currently in Pain?: Yes Pain Score:   3 Pain Location: Back    Exercise/Treatments Stretches Active Hamstring Stretch: 3 reps;20 seconds Lower Trunk Rotation: 3 reps;30 seconds Stability Clam: 15 reps;Side-lying Bridge: 20 Chief of Staff Abdominal Isometric: 5 reps Large Ball Oblique Isometric: 5 reps Straight Leg Raise: Supine;Limitations;10 reps Straight Leg Raises Limitations: 3# Hip Abduction: 15 reps;Side-lying Hip Abduction Limitations: 20x standing Functional Squats: 20 reps Heel Raises: 20 reps  Physical Therapy Assessment and Plan PT Assessment and Plan Clinical Impression Statement: Pt completes therex with minimal difficulty. Began abdominal isometric with pball to increse core strength.  PT Treatment/Interventions: Therapeutic exercise PT Plan: Contniue to progress stability.      Problem List Patient Active Problem List  Diagnoses  . Back pain  . Leg weakness, bilateral  . Stiffness of joints, not elsewhere classified, multiple sites    PT - End of Session Activity Tolerance: Patient tolerated treatment well General Behavior During Session: [facility] for tasks performed Cognition: [facility] for tasks performed  Antonieta Iba 11/09/2010, 9:33 AM

## 2010-11-11 ENCOUNTER — Ambulatory Visit (HOSPITAL_COMMUNITY): Payer: Medicare Other

## 2010-11-11 ENCOUNTER — Telehealth (HOSPITAL_COMMUNITY): Payer: Self-pay

## 2010-11-14 ENCOUNTER — Ambulatory Visit (HOSPITAL_COMMUNITY)
Admission: RE | Admit: 2010-11-14 | Discharge: 2010-11-14 | Disposition: A | Discharge status: Home / Self Care | Payer: Medicare Other | Source: Ambulatory Visit | Attending: Orthopedic Surgery | Admitting: Orthopedic Surgery

## 2010-11-16 ENCOUNTER — Ambulatory Visit (HOSPITAL_COMMUNITY)
Admission: RE | Admit: 2010-11-16 | Discharge: 2010-11-16 | Disposition: A | Discharge status: Home / Self Care | Payer: Medicare Other | Source: Ambulatory Visit | Attending: Orthopedic Surgery | Admitting: Orthopedic Surgery

## 2010-11-16 NOTE — Progress Notes (Signed)
Physical Therapy Treatment Patient Details  Name: [name] MRN: [id number] Date of Birth: 1933-03-17  Today's Date: 11/16/2010 Time: 7829-5621 Time Calculation (min): 51 min Visit#: 7  of 18   Re-eval: 11/23/10 Charges:  therex 42'    Subjective: Symptoms/Limitations Symptoms: No pain; missed last appointment due to diarrhea episode; none in 48 hours.  States he went dancing last night for 3 hours without pain. Pain Assessment Currently in Pain?: No/denies   Exercise/Treatments Stretches  Active Hamstring Stretch: 3 reps;30 seconds  Lower Trunk Rotation: 3 reps;30 seconds  Stability  Bridge: 20 reps  Straight Leg Raise 15 reps 3#  Adduction 20x 5"  Ball rectus abdominal isometric with ball 5X5" Ball oblique isometric with ball 5X5" Side-lying:  Hip Abduction 15 reps each  Clams 10Xeach  Standing:  Functional Squats: 20 reps  Heel Raises: 20 reps  Hip abduction 15 reps each  Hip extension 15 reps each      Physical Therapy Assessment and Plan PT Assessment and Plan Clinical Impression Statement: Overall improving stability and strenth with increased tolerance for functional activities/hobbies. PT Treatment/Interventions: Therapeutic exercise PT Plan: Continue per POC.      Problem List Patient Active Problem List  Diagnoses  . Back pain  . Leg weakness, bilateral  . Stiffness of joints, not elsewhere classified, multiple sites    PT - End of Session Activity Tolerance: Patient tolerated treatment well General Behavior During Session: [facility] for tasks performed Cognition: [facility] for tasks performed  Emeline Gins B 11/16/2010, 9:39 AM

## 2010-11-18 ENCOUNTER — Ambulatory Visit (HOSPITAL_COMMUNITY)
Admission: RE | Admit: 2010-11-18 | Discharge: 2010-11-18 | Disposition: A | Discharge status: Home / Self Care | Payer: Medicare Other | Source: Ambulatory Visit

## 2010-11-18 DIAGNOSIS — R29898 Other symptoms and signs involving the musculoskeletal system: Secondary | ICD-10-CM

## 2010-11-18 DIAGNOSIS — M2560 Stiffness of unspecified joint, not elsewhere classified: Secondary | ICD-10-CM

## 2010-11-18 LAB — CARDIAC PANEL(CRET KIN+CKTOT+MB+TROPI)
Relative Index: 2.3
Relative Index: INVALID
Total CK: 92
Troponin I: 0.03
Troponin I: 0.04

## 2010-11-18 LAB — COMPREHENSIVE METABOLIC PANEL
Alkaline Phosphatase: 55
BUN: 29 — ABNORMAL HIGH
CO2: 24
Chloride: 105
Creatinine, Ser: 2.41 — ABNORMAL HIGH
GFR calc non Af Amer: 27 — ABNORMAL LOW
Glucose, Bld: 153 — ABNORMAL HIGH
Total Bilirubin: 0.9

## 2010-11-18 LAB — CBC
HCT: 34.9 — ABNORMAL LOW
HCT: 35.8 — ABNORMAL LOW
HCT: 33.8 — ABNORMAL LOW
Hemoglobin: 12.2 — ABNORMAL LOW
Hemoglobin: 11.4 — ABNORMAL LOW
MCHC: 34.1
MCHC: 33.6
MCV: 103 — ABNORMAL HIGH
MCV: 103.7 — ABNORMAL HIGH
Platelets: 230
Platelets: 199
RBC: 3.47 — ABNORMAL LOW
RDW: 14.8
RDW: 15
WBC: 5.7

## 2010-11-18 LAB — LIPID PANEL
Cholesterol: 153
LDL Cholesterol: 78
Total CHOL/HDL Ratio: 4.8
Triglycerides: 217 — ABNORMAL HIGH
VLDL: 43 — ABNORMAL HIGH

## 2010-11-18 LAB — BASIC METABOLIC PANEL
BUN: 26 — ABNORMAL HIGH
CO2: 26
CO2: 27
Chloride: 103
Chloride: 105
Creatinine, Ser: 2.01 — ABNORMAL HIGH
Creatinine, Ser: 2.11 — ABNORMAL HIGH
GFR calc Af Amer: 40 — ABNORMAL LOW
Glucose, Bld: 114 — ABNORMAL HIGH
Potassium: 4.4
Potassium: 4.6

## 2010-11-18 LAB — I-STAT 8, (EC8 V) (CONVERTED LAB)
BUN: 27 — ABNORMAL HIGH
Chloride: 106
HCT: 40
Hemoglobin: 13.6
Operator id: 234501
Potassium: 4.4
pCO2, Ven: 52.2 — ABNORMAL HIGH

## 2010-11-18 LAB — DIFFERENTIAL
Basophils Absolute: 0
Eosinophils Absolute: 0.3
Eosinophils Relative: 5
Lymphocytes Relative: 18
Neutrophils Relative %: 63

## 2010-11-18 LAB — URINE MICROSCOPIC-ADD ON

## 2010-11-18 LAB — URINALYSIS, ROUTINE W REFLEX MICROSCOPIC
Bilirubin Urine: NEGATIVE
Hgb urine dipstick: NEGATIVE
Ketones, ur: NEGATIVE
Nitrite: NEGATIVE
Urobilinogen, UA: 0.2

## 2010-11-18 LAB — POCT I-STAT CREATININE
Creatinine, Ser: 2.6 — ABNORMAL HIGH
Operator id: 234501

## 2010-11-18 LAB — B-NATRIURETIC PEPTIDE (CONVERTED LAB)
Pro B Natriuretic peptide (BNP): 374 — ABNORMAL HIGH
Pro B Natriuretic peptide (BNP): 180 — ABNORMAL HIGH

## 2010-11-18 LAB — CK TOTAL AND CKMB (NOT AT ARMC): Relative Index: 2.1

## 2010-11-18 LAB — HEMOGLOBIN A1C
Hgb A1c MFr Bld: 6.6 — ABNORMAL HIGH
Mean Plasma Glucose: 158

## 2010-11-18 LAB — POCT CARDIAC MARKERS
Operator id: 234501
Troponin i, poc: <0.05

## 2010-11-18 LAB — HEPARIN LEVEL (UNFRACTIONATED): Heparin Unfractionated: 0.65

## 2010-11-18 LAB — MAGNESIUM: Magnesium: 2.4

## 2010-11-18 NOTE — Progress Notes (Signed)
Physical Therapy Treatment Patient Details  Name: [name] MRN: [id number] Date of Birth: [birth year]  Today's Date: 11/18/2010 Time: 0817-0910 Time Calculation (min): 53 min Visit#: 8  of 18   Re-eval: 11/23/10  Charge: therex 53 min  Subjective: Symptoms/Limitations Symptoms: No pain today, sprayed 40 gallons of round-up around the house yesterday. Pain Assessment Currently in Pain?: No/denies  Precautions/Restrictions     Mobility (including Balance)       Exercise/Treatments Stretches Active Hamstring Stretch: 3 reps;30 seconds Lumbar Exercises Scapular Retraction: Both;10 reps;Theraband Theraband Level (Scapular Retraction): Level 3 (Green) Row: 10 reps;Theraband Theraband Level (Row): Level 3 (Green) Shoulder Extension: 10 reps;Theraband Theraband Level (Shoulder Extension): Level 3 (Green) Stability Clam: Side-lying;20 reps Bridge: 20 reps Large Ball Abdominal Isometric: 5 reps;5 seconds Large Ball Oblique Isometric: 5 reps;5 seconds Hip Abduction: Side-lying;20 reps Heel Squeeze: 5 reps;5 seconds;Prone Leg Raise: Prone;10 reps Functional Squats: 20 reps  Physical Therapy Assessment and Plan PT Assessment and Plan Clinical Impression Statement: Added prone and tband therex for postural stability with verbal/tactile cues for proper tech and posture.  Improving overall stability and strength with increased tolerance for functional activities/hobbies. PT Plan: Re-eval next session prior MD appt on Tuesday, September 25th.    Goals    Problem List Patient Active Problem List  Diagnoses  . Back pain  . Leg weakness, bilateral  . Stiffness of joints, not elsewhere classified, multiple sites    PT - End of Session Activity Tolerance: Patient tolerated treatment well General Behavior During Session: [facility] for tasks performed Cognition: [facility] for tasks performed  Juel Burrow 11/18/2010, 9:11 AM

## 2010-11-21 ENCOUNTER — Ambulatory Visit (HOSPITAL_COMMUNITY)
Admission: RE | Admit: 2010-11-21 | Discharge: 2010-11-21 | Disposition: A | Discharge status: Home / Self Care | Payer: Medicare Other | Source: Ambulatory Visit | Attending: Orthopedic Surgery | Admitting: Orthopedic Surgery

## 2010-11-21 NOTE — Progress Notes (Signed)
Physical Therapy Treatment Patient Details  Name: [name] MRN: [id number] Date of Birth: 1934/01/04  Today's Date: 11/21/2010 Time: 4098-1191 Time Calculation (min): 56 min Visit#: 8  of 18   Re-eval: 12/09/10 Charges:  therex 32', trunk MMT, Trunk ROM test MMT performed by Donnamae Jude, PT    Subjective: Symptoms/Limitations Symptoms: Pt. states he's not having pain, however takes pain meds regularly to avoid pain.  continues to use [facility], however states he is comfortable continuing to do so. Pain Assessment Currently in Pain?: No/denies   Assessment  (taken by Donnamae Jude, PT) RLE Strength  Right Hip Flexion: 5/5 (was 5/5);  Right Hip Extension:   5/5 (was 3+/5)  Right Hip ABduction:  5/5 (was 4/5) ; Right Hip ADduction:   4/5 (was 4/5) Right Knee Flexion: 5/5  (was 5/5); Right Knee Extension: 5/5 (was 5/5) Right Ankle Dorsiflexion:  3/5 (was 3/5) Right Ankle Plantar Flexion: 5/5 ( was 4/5)  LLE Strength  Left Hip Flexion: 5/5 (was 5/5); Left Hip Extension: 4/5 (was 3+/5) Left Hip ABduction:5/5 (was 4/5); Left Hip ADduction: 5/5 (was 4/5) Left Knee Flexion: 5/5  (was 5/5); Left Knee Extension: 5/5 (was 5/5) Left Ankle Dorsiflexion: 5/5 (was 3/5) Left Ankle Plantar Flexion: 5/5 (was 4/5) Lumbar AROM  Lumbar Flexion: WNL (was decreased 30%) Lumbar Extension: WNL (decreased 20%)  Lumbar - Right Side Bend: WNL (decreased 20%)  Lumbar - Left Side Bend:  WNL (decreased 20%)  Lumbar - Right Rotation: wfl  Lumbar - Left Rotation: wfl  Lumbar Strength  Lumbar Flexion:  3+/5 (was 2/5)  Lumbar Extension: 5/5 (was 3-/5)     Exercise/Treatments Lumbar Exercises Scapular Retraction: 15 reps;Theraband Theraband Level (Scapular Retraction): Level 3 (Green) Row: 15 reps;Theraband Theraband Level (Row): Level 3 (Green) Shoulder Extension: 15 reps;Theraband Theraband Level (Shoulder Extension): Level 3 (Green) Stability Large Ball Abdominal Isometric: 10 reps;5  seconds Large Ball Oblique Isometric: 10 reps;5 seconds Straight Leg Raise: Supine;15 reps;Limitations Straight Leg Raises Limitations: 3# Hip Abduction: Side-lying;15 reps;Weights Hip Abduction Limitations: 3# Heel Squeeze: Prone;5 reps;5 seconds Leg Raise: Prone;10 reps  Physical Therapy Assessment and Plan PT Assessment and Plan Clinical Impression Statement: Pt. re-evaluated by Donnamae Jude, PT today.  Pt found with overall increasing stength, lumbar ROM WNL.  To continue remaining 3 weeks to focus on core stability and balance.  Goal to ambulate without AD per PT.   PT Duration:  (3 weeks) PT Treatment/Interventions: Balance training;Therapeutic exercise PT Plan: Continue X 3 more weeks to work on remaining deficits.  Add stability/balance activities next visit.     Problem List Patient Active Problem List  Diagnoses  . Back pain  . Leg weakness, bilateral  . Stiffness of joints, not elsewhere classified, multiple sites    PT - End of Session Activity Tolerance: Patient tolerated treatment well General Behavior During Session: [facility] for tasks performed Cognition: [facility] for tasks performed  Emeline Gins B 11/21/2010, 9:40 AM

## 2010-11-22 ENCOUNTER — Encounter: Payer: Self-pay | Admitting: Orthopedic Surgery

## 2010-11-22 ENCOUNTER — Ambulatory Visit (INDEPENDENT_AMBULATORY_CARE_PROVIDER_SITE_OTHER): Payer: Medicare Other | Admitting: Orthopedic Surgery

## 2010-11-22 VITALS — Ht 71.0 in | Wt 258.0 lb

## 2010-11-22 DIAGNOSIS — M549 Dorsalgia, unspecified: Secondary | ICD-10-CM

## 2010-11-22 LAB — FOLATE: Folate: 12.6

## 2010-11-22 LAB — B-NATRIURETIC PEPTIDE (CONVERTED LAB)
Pro B Natriuretic peptide (BNP): 110 — ABNORMAL HIGH
Pro B Natriuretic peptide (BNP): 546 — ABNORMAL HIGH
Pro B Natriuretic peptide (BNP): 103 — ABNORMAL HIGH
Pro B Natriuretic peptide (BNP): 111 — ABNORMAL HIGH

## 2010-11-22 LAB — BASIC METABOLIC PANEL WITH GFR
BUN: 42 — ABNORMAL HIGH
BUN: 47 — ABNORMAL HIGH
CO2: 24
CO2: 27
Calcium: 9
Calcium: 9.4
Chloride: 97
Chloride: 99
Creatinine, Ser: 2.29 — ABNORMAL HIGH
Creatinine, Ser: 2.31 — ABNORMAL HIGH
GFR calc non Af Amer: 28 — ABNORMAL LOW
GFR calc non Af Amer: 28 — ABNORMAL LOW
Glucose, Bld: 113 — ABNORMAL HIGH
Glucose, Bld: 144 — ABNORMAL HIGH
Potassium: 4.3
Potassium: 4.9
Sodium: 133 — ABNORMAL LOW
Sodium: 137

## 2010-11-22 LAB — URINALYSIS, ROUTINE W REFLEX MICROSCOPIC
Bilirubin Urine: NEGATIVE
Glucose, UA: NEGATIVE
Ketones, ur: NEGATIVE
Leukocytes, UA: NEGATIVE
Nitrite: NEGATIVE
Specific Gravity, Urine: 1.014
pH: 5

## 2010-11-22 LAB — BASIC METABOLIC PANEL
BUN: 36 — ABNORMAL HIGH
CO2: 29
CO2: 29
CO2: 26
CO2: 27
CO2: 28
Calcium: 9.3
Calcium: 9.3
Calcium: 9.5
Calcium: 9.3
Chloride: 99
Chloride: 101
Chloride: 100
Chloride: 96
Chloride: 99
Creatinine, Ser: 2.01 — ABNORMAL HIGH
GFR calc Af Amer: 42 — ABNORMAL LOW
GFR calc Af Amer: 46 — ABNORMAL LOW
GFR calc Af Amer: 54 — ABNORMAL LOW
GFR calc Af Amer: 36 — ABNORMAL LOW
GFR calc Af Amer: 39 — ABNORMAL LOW
GFR calc Af Amer: 40 — ABNORMAL LOW
GFR calc non Af Amer: 37 — ABNORMAL LOW
GFR calc non Af Amer: 44 — ABNORMAL LOW
Glucose, Bld: 182 — ABNORMAL HIGH
Glucose, Bld: 192 — ABNORMAL HIGH
Potassium: 3.8
Potassium: 3.8
Potassium: 4
Potassium: 3.8
Potassium: 4.3
Potassium: 4.4
Sodium: 138
Sodium: 138
Sodium: 140
Sodium: 138
Sodium: 137

## 2010-11-22 LAB — APTT: aPTT: 37

## 2010-11-22 LAB — CBC
HCT: 31.3 — ABNORMAL LOW
HCT: 32.2 — ABNORMAL LOW
HCT: 34.1 — ABNORMAL LOW
HCT: 30.1 — ABNORMAL LOW
HCT: 30.2 — ABNORMAL LOW
Hemoglobin: 10.9 — ABNORMAL LOW
Hemoglobin: 10.9 — ABNORMAL LOW
Hemoglobin: 11.2 — ABNORMAL LOW
Hemoglobin: 11.6 — ABNORMAL LOW
Hemoglobin: 10.4 — ABNORMAL LOW
Hemoglobin: 10.5 — ABNORMAL LOW
Hemoglobin: 11.3 — ABNORMAL LOW
MCHC: 33.8
MCHC: 33.9
MCHC: 33.8
MCHC: 34.7
MCHC: 34.8
MCV: 101.4 — ABNORMAL HIGH
MCV: 100.1 — ABNORMAL HIGH
MCV: 100.4 — ABNORMAL HIGH
MCV: 100.7 — ABNORMAL HIGH
MCV: 100.8 — ABNORMAL HIGH
Platelets: 302
RBC: 3.28 — ABNORMAL LOW
RBC: 3.35 — ABNORMAL LOW
RBC: 3 — ABNORMAL LOW
RBC: 2.98 — ABNORMAL LOW
RBC: 3.01 — ABNORMAL LOW
RBC: 3.22 — ABNORMAL LOW
RDW: 14.8
RDW: 15
RDW: 14.7
WBC: 9.2
WBC: 9.5
WBC: 10.7 — ABNORMAL HIGH
WBC: 10.4
WBC: 8

## 2010-11-22 LAB — DIFFERENTIAL
Basophils Absolute: 0
Eosinophils Absolute: 0.1
Eosinophils Relative: 2
Eosinophils Relative: 1
Lymphocytes Relative: 12
Lymphocytes Relative: 13
Lymphocytes Relative: 11 — ABNORMAL LOW
Lymphs Abs: 1.1
Lymphs Abs: 1.3
Lymphs Abs: 1.2
Monocytes Absolute: 1.1 — ABNORMAL HIGH
Monocytes Relative: 12
Monocytes Relative: 11
Neutro Abs: 6.6
Neutro Abs: 6.7
Neutrophils Relative %: 72

## 2010-11-22 LAB — HEPARIN LEVEL (UNFRACTIONATED): Heparin Unfractionated: 0.48

## 2010-11-22 LAB — POCT CARDIAC MARKERS
Operator id: 198161
Troponin i, poc: <0.05

## 2010-11-22 LAB — COMPREHENSIVE METABOLIC PANEL
AST: 22
Albumin: 3.3 — ABNORMAL LOW
Alkaline Phosphatase: 79
Chloride: 101
GFR calc Af Amer: 50 — ABNORMAL LOW
Potassium: 3.6
Sodium: 139
Total Bilirubin: 0.7

## 2010-11-22 LAB — VITAMIN B12: Vitamin B-12: 159 — ABNORMAL LOW (ref 211–911)

## 2010-11-22 LAB — HEMOGLOBIN A1C
Hgb A1c MFr Bld: 7 — ABNORMAL HIGH
Mean Plasma Glucose: 172

## 2010-11-22 LAB — CARDIAC PANEL(CRET KIN+CKTOT+MB+TROPI)
CK, MB: 1.8
CK, MB: 1.8
Relative Index: INVALID
Total CK: 60
Total CK: 87

## 2010-11-22 LAB — URINE MICROSCOPIC-ADD ON

## 2010-11-22 LAB — PROTIME-INR: Prothrombin Time: 13.9

## 2010-11-22 NOTE — Progress Notes (Signed)
Recheck  Back pain  Patient notes improvement  Complains of some numbness and tingling in his RIGHT foot  Recommendation continue physical therapy come back in 3 weeks for reevaluation

## 2010-11-22 NOTE — Patient Instructions (Signed)
Keep going to physical therapy   Continue the pain medication

## 2010-11-23 ENCOUNTER — Ambulatory Visit (HOSPITAL_COMMUNITY)
Admission: RE | Admit: 2010-11-23 | Discharge: 2010-11-23 | Disposition: A | Discharge status: Home / Self Care | Payer: Medicare Other | Source: Ambulatory Visit | Attending: Orthopedic Surgery | Admitting: Orthopedic Surgery

## 2010-11-23 NOTE — Progress Notes (Signed)
Physical Therapy Treatment Patient Details  Name: [name] MRN: [id number] Date of Birth: Nov 27, 1933  Today's Date: 11/23/2010 Time: 3086-5784 Time Calculation (min): 40 min Visit#: 9  of 12   Re-eval: 12/09/10 Charges: Therex x 38'  Subjective: Symptoms/Limitations Symptoms: My pain is a 1-2 in my R hip. Pain Assessment Currently in Pain?: Yes Pain Score:   1 (1-2) Pain Location: Hip Pain Orientation: Right  Exercise/Treatments Lumbar Exercises Scapular Retraction: 15 reps;Theraband (Simultaneous filing. User may not have seen previous data.) Theraband Level (Scapular Retraction): Level 3 (Green) (Simultaneous filing. User may not have seen previous data.) Row: 15 reps;Theraband (Simultaneous filing. User may not have seen previous data.) Theraband Level (Row): Level 3 (Green) (Simultaneous filing. User may not have seen previous data.) Shoulder Extension: 15 reps;Theraband (Simultaneous filing. User may not have seen previous data.) Theraband Level (Shoulder Extension): Level 3 (Green) Stability Bent Knee Raise: 15 reps Isometric Hip Flexion: 10 reps Large Ball Abdominal Isometric: 10 reps;5 seconds Large Ball Oblique Isometric: 10 reps;5 seconds Straight Leg Raise: Supine;Limitations;20 reps (2x10) Straight Leg Raises Limitations: 3# Hip Abduction: Side-lying;15 reps;Weights Hip Abduction Limitations: 3# Heel Squeeze: 10 reps;5 seconds;Prone Leg Raise: Prone;10 reps  Physical Therapy Assessment and Plan PT Assessment and Plan Clinical Impression Statement: Pt requires multimodal cueing for proper form with all scapular theraband exercises. Pt tolerates increases in therex with minimal difficulty. PT Treatment/Interventions: Therapeutic exercise PT Plan: Continue to progress per PT POC.     Problem List Patient Active Problem List  Diagnoses  . Back pain  . Leg weakness, bilateral  . Stiffness of joints, not elsewhere classified, multiple sites    PT -  End of Session Activity Tolerance: Patient tolerated treatment well General Behavior During Session: [facility] for tasks performed Cognition: [facility] for tasks performed  Antonieta Iba 11/23/2010, 9:47 AM

## 2010-11-25 ENCOUNTER — Ambulatory Visit (HOSPITAL_COMMUNITY)
Admission: RE | Admit: 2010-11-25 | Discharge: 2010-11-25 | Disposition: A | Discharge status: Home / Self Care | Payer: Medicare Other | Source: Ambulatory Visit | Attending: Pulmonary Disease | Admitting: Pulmonary Disease

## 2010-11-25 DIAGNOSIS — R29898 Other symptoms and signs involving the musculoskeletal system: Secondary | ICD-10-CM

## 2010-11-25 DIAGNOSIS — M2560 Stiffness of unspecified joint, not elsewhere classified: Secondary | ICD-10-CM

## 2010-11-25 NOTE — Progress Notes (Signed)
Physical Therapy Treatment Patient Details  Name: FINDLEY VI MRN: [id number] Date of Birth: 1933-11-23  Today's Date: 11/25/2010 Time: 4782-9562 Time Calculation (min): 42 min Visit#: 10  of 12   Re-eval: 11/30/10    Subjective: Symptoms/Limitations Symptoms: Pt amb without SPC reports no falls or LOB episodes, min back pain 1/10. Pain Assessment Currently in Pain?: Yes Pain Score:   1 Pain Location: Hip Pain Orientation: Right  Precautions/Restrictions   none    Exercise/Treatments Stretches   Lumbar Exercises Scapular Retraction: 15 reps;Theraband Theraband Level (Scapular Retraction): Level 3 (Green) Row: 15 reps;Theraband Theraband Level (Row): Level 3 (Green) Shoulder Extension: 15 reps;Theraband Theraband Level (Shoulder Extension): Level 3 (Green) Stability: Supine: Bridge: 20 reps Isometric Hip Flexion: 15 reps;5 seconds Large Ball Abdominal Isometric: 10 reps;5 seconds Large Ball Oblique Isometric: 10 reps;5 seconds Straight Leg Raise: 20 reps;Limitations Straight Leg Raises Limitations: 3#, 2x 10 Side-lying Hip Abduction: Side-lying;15 reps;Weights Hip Abduction Limitations: 3# Prone Heel Squeeze: 10 reps- Single Arm Raise: 10 reps Leg Raise: 10 reps Opposite Arm/Leg Raise: 5 reps Machine Exercises       Physical Therapy Assessment and Plan PT Assessment and Plan Clinical Impression Statement: Pt states he feels better everyday. Rehab Potential: Good Clinical Impairments Affecting Rehab Potential: weak core mm PT Duration: 4 weeks PT Plan: Pt to be seen two more treatments then anticiipate discharge.    Goals  Pt to ambulate without cane at all times  Problem List Patient Active Problem List  Diagnoses  . Back pain  . Leg weakness, bilateral  . Stiffness of joints, not elsewhere classified, multiple sites    PT - End of Session Activity Tolerance: Patient tolerated treatment well General Behavior During Session: [facility] for tasks  performed Cognition: [name] for tasks performed  RUSSELL,CINDY 11/25/2010, 9:23 AM

## 2010-11-28 ENCOUNTER — Ambulatory Visit (HOSPITAL_COMMUNITY)
Admission: RE | Admit: 2010-11-28 | Discharge: 2010-11-28 | Disposition: A | Discharge status: Home / Self Care | Payer: Medicare Other | Source: Ambulatory Visit | Attending: Pulmonary Disease | Admitting: Pulmonary Disease

## 2010-11-28 DIAGNOSIS — E119 Type 2 diabetes mellitus without complications: Secondary | ICD-10-CM | POA: Insufficient documentation

## 2010-11-28 DIAGNOSIS — M545 Low back pain, unspecified: Secondary | ICD-10-CM | POA: Insufficient documentation

## 2010-11-28 DIAGNOSIS — M25559 Pain in unspecified hip: Secondary | ICD-10-CM | POA: Insufficient documentation

## 2010-11-28 DIAGNOSIS — R262 Difficulty in walking, not elsewhere classified: Secondary | ICD-10-CM | POA: Insufficient documentation

## 2010-11-28 DIAGNOSIS — IMO0001 Reserved for inherently not codable concepts without codable children: Secondary | ICD-10-CM | POA: Insufficient documentation

## 2010-11-28 NOTE — Progress Notes (Signed)
Physical Therapy Treatment Patient Details  Name: [name] MRN: [id number] Date of Birth: [birth year]  Today's Date: 11/28/2010 Time: 4098-1191 Time Calculation (min): 42 min Visit#: 11  of 12   Re-eval: 11/30/10 Charges: Therex x 38'  Subjective: Symptoms/Limitations Symptoms: Having a little bit of pain this morning, not too bad. Pain Assessment Currently in Pain?: Yes Pain Score:   1 Pain Location: Hip Pain Orientation: Right   Exercise/Treatments Lumbar Exercises Scapular Retraction: Other (comment) (20 reps) Theraband Level (Scapular Retraction): Level 3 (Green) Row: Other (comment);Theraband (20 reps) Theraband Level (Row): Level 3 (Green) Shoulder Extension: Other (comment);Theraband (20 reps) Theraband Level (Shoulder Extension): Level 3 (Green) Stability Bridge: 20 reps Isometric Hip Flexion: 15 reps;5 seconds Large Ball Abdominal Isometric: 10 reps;5 seconds Large Ball Oblique Isometric: 10 reps;5 seconds Straight Leg Raise: 20 reps;Limitations Straight Leg Raises Limitations: 3#, 2x 10 Heel Squeeze: 10 reps Leg Raise: 15 reps;Right;Left;Prone  Physical Therapy Assessment and Plan PT Assessment and Plan Clinical Impression Statement: Pt unable to complete prone single arm raise because he could not tolerate laying with head down on towel. Pt completes therex with minimal difficulty. PT ambulates into therapy w/o AD. PT Treatment/Interventions: Therapeutic exercise PT Plan: Reassess next tx.     Problem List Patient Active Problem List  Diagnoses  . Back pain  . Leg weakness, bilateral  . Stiffness of joints, not elsewhere classified, multiple sites    PT - End of Session Activity Tolerance: Patient tolerated treatment well General Behavior During Session: [facility] for tasks performed Cognition: [facility] for tasks performed  Antonieta Iba 11/28/2010, 8:59 AM

## 2010-11-30 ENCOUNTER — Ambulatory Visit (HOSPITAL_COMMUNITY)
Admission: RE | Admit: 2010-11-30 | Discharge: 2010-11-30 | Disposition: A | Discharge status: Home / Self Care | Payer: Medicare Other | Source: Ambulatory Visit | Attending: Pulmonary Disease | Admitting: Pulmonary Disease

## 2010-11-30 NOTE — Progress Notes (Signed)
Physical Therapy Treatment Patient Details  Name: [name] MRN: [id number] Date of Birth: December 15, 1933  Today's Date: 11/30/2010 Time: 4098-1191 Time Calculation (min): 43 min Visit#: 12  of 18   Re-eval: 12/09/10 Charges:  MHP 1 unit, manual 8', therex30'    Subjective: Symptoms/Limitations Symptoms: Pt. reports more pain since last visit/ thinks may be due to the weather (rainy/humid).  States pain going down R LE/into hip.  Pt. also complains of pain in his R arch of foot. Pain Assessment Currently in Pain?: Yes Pain Score:   6 Pain Location: Back Pain Orientation: Right Pain Radiating Towards: R LE  Exercise/Treatments Stretches Active Hamstring Stretch: 3 reps;30 seconds Lower Trunk Rotation: 3 reps;10 seconds Lumbar Exercises Scapular Retraction:  (Held all Theraband exercises due to increased pain) Stability Bridge: 20 reps Isometric Hip Flexion: 15 reps;5 seconds Straight Leg Raise: 10 reps Straight Leg Raises Limitations: 2 sets, no weight today Hip Abduction Limitations: HELD TODAY Heel Squeeze:  (HELD) Single Arm Raise:  (HELD) Leg Raise:  (HELD) Opposite Arm/Leg Raise:  (HELD)  Modalities Modalities: Moist Heat (with supine therex) Manual Therapy Joint Mobilization: SI joint, STM and MET Moist Heat Therapy Number Minutes Moist Heat: 20 Minutes Moist Heat Location:  (lumbar)  Physical Therapy Assessment and Plan PT Assessment and Plan Clinical Impression Statement: Held prone, tband and sidelying exercises today.  focused on getting pain decreased.  Added moist heat with supine therex and STM/MET in L sidelying to  R LB and hip. PT Treatment/Interventions: Therapeutic exercise (MHP and manual therapy) PT Plan: Continue X 4 more visits per 11/21/10 re-eval; Pt to return to MD 12/13/10.     Problem List Patient Active Problem List  Diagnoses  . Back pain  . Leg weakness, bilateral  . Stiffness of joints, not elsewhere classified, multiple sites      PT - End of Session Activity Tolerance: Patient tolerated treatment well General Behavior During Session: [facility] for tasks performed Cognition: [facility] for tasks performed  Bascom Levels, Petra Sargeant B 11/30/2010, 9:22 AM

## 2010-12-02 ENCOUNTER — Ambulatory Visit (HOSPITAL_COMMUNITY)
Admission: RE | Admit: 2010-12-02 | Discharge: 2010-12-02 | Disposition: A | Discharge status: Home / Self Care | Payer: Medicare Other | Source: Ambulatory Visit | Attending: Pulmonary Disease | Admitting: Pulmonary Disease

## 2010-12-02 NOTE — Progress Notes (Signed)
Physical Therapy Treatment Patient Details  Name: [name] MRN: [id number] Date of Birth: [birth year]  Today's Date: 12/02/2010 Time: 4098-1191 Time Calculation (min): 49 min Visit#: 13  of 18   Re-eval: 12/09/10  Charge:  There ex 40 manual 8'  Subjective:  Pt states he felt better after treatment.      Exercise/Treatments Stretches Active Hamstring Stretch: 3 reps;30 seconds Lower Trunk Rotation: 3 reps;20 seconds Lumbar Exercises   Stability Bridge: 20 reps Bent Knee Raise: 15 reps Isometric Hip Flexion: 15 reps Hip Abduction: 15 reps Heel Squeeze: 15 reps Leg Raise: 10 reps     Manual Therapy Manual Therapy: Joint mobilization Joint Mobilization: for SI R SI was posterior rotated worked on mm energy to anterior rotate.    Physical Therapy Assessment and Plan PT Assessment and Plan Clinical Impression Statement: Began prone exercises back again secondary to significant weakness in back extensors and glut max needs this strengthened.  Pt responded well top mm energy techniques. PT Plan: Continue to work on strengthening    Goals  Pt to be able to ambulate without cane  Problem List Patient Active Problem List  Diagnoses  . Back pain  . Leg weakness, bilateral  . Stiffness of joints, not elsewhere classified, multiple sites    PT - End of Session Activity Tolerance: Patient tolerated treatment well General Behavior During Session: [facility] for tasks performed Cognition: [facility] for tasks performed  Rachael Ferrie,CINDY 12/02/2010, 9:42 AM

## 2010-12-05 ENCOUNTER — Ambulatory Visit (HOSPITAL_COMMUNITY)
Admission: RE | Admit: 2010-12-05 | Discharge: 2010-12-05 | Disposition: A | Discharge status: Home / Self Care | Payer: Medicare Other | Source: Ambulatory Visit | Attending: Pulmonary Disease | Admitting: Pulmonary Disease

## 2010-12-05 NOTE — Progress Notes (Signed)
Physical Therapy Treatment Patient Details  Name: YOAV OKANE MRN: [id number] Date of Birth: 1934-01-26  Today's Date: 12/05/2010 Time: 8657-8469 Time Calculation (min): 47 min Visit#: 14  of 18   Re-eval: 12/09/10 Charges:  therex 35', Moist heat 1 unit    Subjective: Symptoms: Pt. states his pain has decreased some today.  States it hurt alot yesterday. Pain Score:   3 Pain Location: Back Pain Orientation: Right Pain Radiating Towards: R LE  Exercise/Treatments Stretches Active Hamstring Stretch: 3 reps;30 seconds Lower Trunk Rotation: 3 reps;20 seconds Stability Bridge: 20 reps Bent Knee Raise: 15 reps Isometric Hip Flexion: 15 reps Straight Leg Raise: 20 reps Straight Leg Raises Limitations: 2 sets of 10 reps. no weights Heel Squeeze: 15 reps;5 seconds;Limitations Heel Squeeze Limitations: PRONE Single Arm Raise: 10 reps;Limitations Single Arm Raises Limitations: W-back, PRONE Leg Raise: 10 reps;Limitations Leg Raises Limitations: PRONE Opposite Arm/Leg Raise: 5 reps;Limitations Opposite Arm/Leg Raise Limitations: PRONE  Modalities Modalities: Moist Heat Number Minutes Moist Heat: 20 Minutes Moist Heat Location:  (Lumbar with therex)  Physical Therapy Assessment and Plan PT Assessment and Plan Clinical Impression Statement: Progressed prone exercises today with one pillow under abdomen.  Limited time in prone due to claustiphobia.  Pt reports overall decreased pain after session today. PT Plan: Progress stength and stability.  Re-eval X 2 more visits for return to MD 12/13/10.    Problem List Patient Active Problem List  Diagnoses  . Back pain  . Leg weakness, bilateral  . Stiffness of joints, not elsewhere classified, multiple sites    PT - End of Session Activity Tolerance: Patient tolerated treatment well General Behavior During Session: [facility] for tasks performed Cognition: [facility] for tasks performed  Emeline Gins B 12/05/2010, 9:24 AM

## 2010-12-07 ENCOUNTER — Ambulatory Visit (HOSPITAL_COMMUNITY)
Admission: RE | Admit: 2010-12-07 | Discharge: 2010-12-07 | Disposition: A | Discharge status: Home / Self Care | Payer: Medicare Other | Source: Ambulatory Visit | Attending: Pulmonary Disease | Admitting: Pulmonary Disease

## 2010-12-07 NOTE — Progress Notes (Signed)
Physical Therapy Treatment Patient Details  Name: [name] MRN: [id number] Date of Birth: April 04, 1933  Today's Date: 12/07/2010 Time: 7829-5621 Time Calculation (min): 49 min Visit#: 15  of 18   Re-eval: 12/09/10 Charges:  therex 35',  MHP 1 unit,  Manual 5'    Subjective: Symptoms/Limitations Symptoms: Pt. reports it felt really good for about 8 hours after last session then the pain returned.  Feels like spasms in his right hip and lower back. Pain Assessment Currently in Pain?: Yes Pain Score:   2 Pain Location: Back Pain Orientation: Right Pain Radiating Towards: R hip and LE   Exercise/Treatments Stability Bridge: 20 reps Bent Knee Raise: 15 reps Isometric Hip Flexion: 15 reps Straight Leg Raise: 20 reps Straight Leg Raises Limitations: 2 sets of 10 reps. no weights Heel Squeeze: 20 reps Heel Squeeze Limitations: PRONE Single Arm Raise: 10 reps;Limitations Single Arm Raises Limitations: W-back, PRONE Leg Raise: 10 reps;Limitations Leg Raises Limitations: PRONE Opposite Arm/Leg Raise: 10 reps;Limitations Opposite Arm/Leg Raise Limitations: PRONE  Modalities Modalities: Moist Heat Manual Therapy Joint Mobilization: DTM to R lumbar and SI.  Hips in alignment today. Moist Heat Therapy Number Minutes Moist Heat: 20 Minutes Moist Heat Location:  (lumbar with therex)  Physical Therapy Assessment and Plan PT Assessment and Plan Clinical Impression Statement: Pt. requires redirecting to remain on task.  Able to increase reps in prone without difficulty.  Added DTM to R SI and low back to decrease pain/spasm. PT Treatment/Interventions: Therapeutic exercise (manual and moist heat) PT Plan: Re-evaluate next visit; Returns to MD 12/13/10.     Problem List Patient Active Problem List  Diagnoses  . Back pain  . Leg weakness, bilateral  . Stiffness of joints, not elsewhere classified, multiple sites    PT - End of Session Activity Tolerance: Patient  tolerated treatment well General Behavior During Session: [name] for tasks performed Cognition: [name] for tasks performed  [name] 12/07/2010, 9:21 AM

## 2010-12-12 ENCOUNTER — Ambulatory Visit (HOSPITAL_COMMUNITY)
Admission: RE | Admit: 2010-12-12 | Discharge: 2010-12-12 | Disposition: A | Discharge status: Home / Self Care | Payer: Medicare Other | Source: Ambulatory Visit | Attending: Pulmonary Disease | Admitting: Pulmonary Disease

## 2010-12-12 DIAGNOSIS — M2560 Stiffness of unspecified joint, not elsewhere classified: Secondary | ICD-10-CM

## 2010-12-12 DIAGNOSIS — R29898 Other symptoms and signs involving the musculoskeletal system: Secondary | ICD-10-CM

## 2010-12-12 NOTE — Progress Notes (Signed)
Physical Therapy Evaluation  Patient Details  Name: [name] MRN: [id number] Date of Birth: October 23, 1933  Today's Date: 12/12/2010 Time: 4098-1191 Time Calculation (min): 44 min Visit#: 16  of 18   Re-eval:      Past Medical History:  Past Medical History  Diagnosis Date  . Heart attack   . Diabetes mellitus type II    Past Surgical History:  Past Surgical History  Procedure Date  . Gallbladder surgery   . Back surgery   . Heart stents   . Heart bypass   . Finger surgery right little    Subjective Symptoms/Limitations Symptoms: [name] states that his pain is better than is was but it is not a zero. The patient states that his back is not hurting him it is more his hip.   The patient states that his right ankle is still hurting him as well. How long can you sit comfortably?: The patient states that he is able to sit for hours at eval patient was only able to sit for 30 minutes. How long can you stand comfortably?: The patient states that he is able to stand for ten minutes at  initial evaluation the patient  was able to stand for five minutes. How long can you walk comfortably?: The patient is currently able to walk without his cane for less than five minutes when he has increased pain.  Patient was having to use his cane and was unable to walk. Pain Assessment Currently in Pain?: Yes Pain Score:   4 Pain Location: Hip Pain Orientation: Right Pain Radiating Towards: no radicular symptom Pain Onset: More than a month ago Pain Relieving Factors: sitting down. Multiple Pain Sites: Yes  Precautions/Restrictions     Prior Functioning     Sensation/Coordination/Flexibility    Assessment RLE Strength Right Hip Extension: 4/5 Right Hip ABduction: 5/5 Right Hip ADduction: 4/5 Right Knee Flexion: 5/5 Right Knee Extension: 5/5 Right Ankle Dorsiflexion: 3/5 Right Ankle Plantar Flexion: 4/5 LLE Strength Left Hip Flexion: 5/5 Left Hip Extension: 4/5 Left Hip  ABduction: 5/5 Left Hip ADduction: 4/5 Left Knee Flexion: 5/5 Left Knee Extension: 5/5 Left Ankle Dorsiflexion: 3/5 Left Ankle Plantar Flexion: 4/5 Lumbar AROM Lumbar Flexion:  (decreased 30%) Lumbar Extension: WFL Lumbar - Right Side Bend: WFL Lumbar - Left Side Bend: WFL Lumbar - Right Rotation: wfl Lumbar - Left Rotation: wfl Lumbar Strength Lumbar Flexion: 2/5 Lumbar Extension: 3-/5        Physical Therapy Assessment and Plan PT Assessment and Plan Clinical Impression Statement: Patient has improved objectively with no subjective improvement.  Pain in hip only with weight bearing activitiy. PT Plan: discharge from therapy    Goals Home Exercise Program Pt will Perform Home Exercise Program: Independently PT Goal: Perform Home Exercise Program - Progress: Partly met (Patient states he tries to do his exercises.l) PT Short Term Goals PT Short Term Goal 1: Goal was for pain to be decreased by 50 %  PT Short Term Goal 1 - Progress: Not met PT Long Term Goals PT Long Term Goal 1: Goal was to increase to advance HEP PT Long Term Goal 1 - Progress: Partly met PT Long Term Goal 2: Goal was to increase strength of LE by one greade PT Long Term Goal 2 - Progress: Met Long Term Goal 3: Goal was to decrease pain by 75% Long Term Goal 3 Progress: Not met  Problem List Patient Active Problem List  Diagnoses  . Back pain  . Leg  weakness, bilateral  . Stiffness of joints, not elsewhere classified, multiple sites    PT - End of Session Activity Tolerance: Patient tolerated treatment well General Behavior During Session: [facility] for tasks performed Cognition: [facility] for tasks performed   [name] 12/12/2010, 9:36 AM  Physician Documentation Your signature is required to indicate approval of the treatment plan as stated above.  Please sign and either send electronically or make a copy of this report for your files and return this physician signed original.   Please mark  one 1.__approve of plan  2. ___approve of plan with the following conditions.   ______________________________                                                          _____________________ Physician Signature                                                                                                             Date

## 2010-12-12 NOTE — Patient Instructions (Addendum)
HEP

## 2010-12-12 NOTE — Progress Notes (Signed)
Physical Therapy Evaluation  Patient Details  Name: [name] MRN: [id number] Date of Birth: 11-26-1933  Today's Date: 12/12/2010  Time:  8:49-9:30  Charge:  Mmtest; ROM test Past Medical History:  Past Medical History  Diagnosis Date  . Heart attack   . Diabetes mellitus type II    Past Surgical History:  Past Surgical History  Procedure Date  . Gallbladder surgery   . Back surgery   . Heart stents   . Heart bypass   . Finger surgery right little    Subjective Symptoms/Limitations Symptoms: [name] states that his pain is better than is was but it is not a zero. The patient states that his back is not hurting him it is more his hip.   The patient states that his right ankle is still hurting him as well. How long can you sit comfortably?: The patient states that he is able to sit for hours at eval patient was only able to sit for 30 minutes. How long can you stand comfortably?: The patient states that he is able to stand for ten minutes at  initial evaluation the patient  was able to stand for five minutes. How long can you walk comfortably?: The patient is currently able to walk without his cane for less than five minutes when he has increased pain.  Patient was having to use his cane and was unable to walk. Pain Assessment Currently in Pain?: Yes Pain Score:   4 Pain Location: Hip Pain Orientation: Right Pain Radiating Towards: no radicular symptom Pain Onset: More than a month ago Pain Relieving Factors: sitting down. Multiple Pain Sites: Yes    Objective:  L hip extension is 4/5 was 3+; hip abduction is 5/5 was 4/5; hip adduction is 4/5 was 4/5; DF is 4/5 was 3/5; PF is 4/5 was 4/5;                   R hip extension is 4/5 was 3+; Ab is 5/5 was 4/5, add 4/5 was 4/5; DF 3/5 was 3/5    Exercise/Treatments   Pt was given theraband and exercise sheet for R ankle strengthening. Given a walking program.     Physical Therapy Assessment and Plan      Objectively patient has improved. Subjectively and functionally he has not.  Recommend D/C of PT as services do not seem to functionally improving the patient. Goals Home Exercise Program PT Goal: Perform Home Exercise Program - Progress: Partly met PT Short Term Goals PT Short Term Goal 1 - Progress: Not met  Problem List Patient Active Problem List  Diagnoses  . Back pain  . Leg weakness, bilateral  . Stiffness of joints, not elsewhere classified, multiple sites        RUSSELL,CINDY 12/12/2010, 6:02 PM  Physician Documentation Your signature is required to indicate approval of the treatment plan as stated above.  Please sign and either send electronically or make a copy of this report for your files and return this physician signed original.   Please mark one 1.__approve of plan  2. ___approve of plan with the following conditions.   ______________________________                                                          _____________________ Physician Signature  Date  

## 2010-12-13 ENCOUNTER — Ambulatory Visit (INDEPENDENT_AMBULATORY_CARE_PROVIDER_SITE_OTHER): Payer: Medicare Other | Admitting: Orthopedic Surgery

## 2010-12-13 ENCOUNTER — Encounter: Payer: Self-pay | Admitting: Orthopedic Surgery

## 2010-12-13 VITALS — Ht 71.0 in | Wt 258.0 lb

## 2010-12-13 DIAGNOSIS — M5137 Other intervertebral disc degeneration, lumbosacral region: Secondary | ICD-10-CM

## 2010-12-13 DIAGNOSIS — M5136 Other intervertebral disc degeneration, lumbar region: Secondary | ICD-10-CM

## 2010-12-13 MED ORDER — GABAPENTIN 100 MG PO CAPS: 100.0000 mg | ORAL_CAPSULE | Freq: Three times a day (TID) | ORAL | Status: DC

## 2010-12-13 NOTE — Patient Instructions (Signed)
Referral to Neurosurgery

## 2010-12-13 NOTE — Progress Notes (Signed)
Chief complaint: RIGHT hip pain  HPI:(4) I have a 77 year old male had lumbar disc procedure in 1987 was doing well in his usual state of health until he twisted his ankle. During the treatment he started to limp in about 2 weeks afterwards started having pain in his RIGHT hip. (Which is actually in the RIGHT lower back area). He has minimal radicular symptoms at this time. The pain is sharp it's 5 or 6/10. It tends to come and go. He has some mild numbness and tingling in the RIGHT leg. It's worse with walking and standing. The pain is not temporally related.  The x-rays and MRI of his hip show very minor degenerative changes in the hip joint. He has undergone physical therapy and continues to have radicular pain in his leg.  Recommend either epidural injection or a neurosurgical consultation. He opted for neurosurgical consultation and we will make the referral. I did start him on Neurontin 100 mg 3 times a day to work on his neurogenic pain

## 2011-01-02 ENCOUNTER — Telehealth: Payer: Self-pay | Admitting: Orthopedic Surgery

## 2011-01-02 NOTE — Telephone Encounter (Signed)
Patient called to ask about the referral to neurosurgeon.  His cell # is [id number], and home # [phone].

## 2011-01-04 ENCOUNTER — Telehealth: Payer: Self-pay | Admitting: Radiology

## 2011-01-04 NOTE — Telephone Encounter (Signed)
Patient aware, per phone with Luster Landsberg, that his referral is in processing and they we are working on earliest available appointment.

## 2011-01-04 NOTE — Telephone Encounter (Signed)
I faxed a referral for this patient to [facility] to be seen for his back.

## 2011-01-05 ENCOUNTER — Telehealth: Payer: Self-pay | Admitting: Radiology

## 2011-01-05 ENCOUNTER — Encounter: Payer: Self-pay | Admitting: Orthopedic Surgery

## 2011-01-05 ENCOUNTER — Other Ambulatory Visit: Payer: Self-pay | Admitting: Orthopedic Surgery

## 2011-01-05 DIAGNOSIS — M5136 Other intervertebral disc degeneration, lumbar region: Secondary | ICD-10-CM

## 2011-01-05 NOTE — Progress Notes (Signed)
To increase his Neurontin from 100 mg 3 times a day to 300 mg 3 times a day when he runs out and he can call us to refill the medicine early

## 2011-01-05 NOTE — Telephone Encounter (Signed)
I called the patient and instructed him to increase his Neurontin to 300 mg, 3 x a day per Dr. Romeo Apple. He is to call his pharmacy and get them to fax [place] a request when he runs out because this will cause him to run out early.

## 2011-01-09 ENCOUNTER — Telehealth: Payer: Self-pay | Admitting: Radiology

## 2011-01-09 NOTE — Telephone Encounter (Signed)
I called to give the patient his appointment at [facility] with Dr. Venetia Maxon on 02-13-11 at 11:00. Patient is aware of his appointment and is aware to take his films.

## 2011-01-10 ENCOUNTER — Other Ambulatory Visit: Payer: Self-pay | Admitting: *Deleted

## 2011-01-10 DIAGNOSIS — M5136 Other intervertebral disc degeneration, lumbar region: Secondary | ICD-10-CM

## 2011-01-10 MED ORDER — GABAPENTIN 100 MG PO CAPS: ORAL_CAPSULE | ORAL | Status: DC

## 2011-02-02 ENCOUNTER — Other Ambulatory Visit: Payer: Self-pay | Admitting: Orthopedic Surgery

## 2011-02-03 NOTE — Telephone Encounter (Signed)
APPROVED OK TO CALL IN

## 2011-02-13 ENCOUNTER — Other Ambulatory Visit: Payer: Self-pay | Admitting: Neurosurgery

## 2011-02-13 DIAGNOSIS — M545 Low back pain, unspecified: Secondary | ICD-10-CM

## 2011-02-13 DIAGNOSIS — IMO0002 Reserved for concepts with insufficient information to code with codable children: Secondary | ICD-10-CM

## 2011-02-22 ENCOUNTER — Ambulatory Visit
Admission: RE | Admit: 2011-02-22 | Discharge: 2011-02-22 | Disposition: A | Discharge status: Home / Self Care | Payer: Medicare Other | Source: Ambulatory Visit | Attending: Neurosurgery | Admitting: Neurosurgery

## 2011-02-22 DIAGNOSIS — M545 Low back pain, unspecified: Secondary | ICD-10-CM

## 2011-02-22 DIAGNOSIS — IMO0002 Reserved for concepts with insufficient information to code with codable children: Secondary | ICD-10-CM

## 2011-02-22 MED ORDER — GADOBENATE DIMEGLUMINE 529 MG/ML IV SOLN
20.0000 mL | Freq: Once | INTRAVENOUS | Status: AC | PRN
  Administered 2011-02-22: 20 mL via INTRAVENOUS

## 2011-02-23 ENCOUNTER — Encounter (HOSPITAL_COMMUNITY): Payer: Self-pay

## 2011-02-23 ENCOUNTER — Emergency Department (HOSPITAL_COMMUNITY)
Admission: EM | Admit: 2011-02-23 | Discharge: 2011-02-23 | Disposition: A | Discharge status: Home / Self Care | Payer: Medicare Other | Attending: Emergency Medicine | Admitting: Emergency Medicine

## 2011-02-23 ENCOUNTER — Emergency Department (HOSPITAL_COMMUNITY): Payer: Medicare Other

## 2011-02-23 DIAGNOSIS — S82401A Unspecified fracture of shaft of right fibula, initial encounter for closed fracture: Secondary | ICD-10-CM

## 2011-02-23 DIAGNOSIS — Z794 Long term (current) use of insulin: Secondary | ICD-10-CM | POA: Insufficient documentation

## 2011-02-23 DIAGNOSIS — I252 Old myocardial infarction: Secondary | ICD-10-CM | POA: Insufficient documentation

## 2011-02-23 DIAGNOSIS — W19XXXA Unspecified fall, initial encounter: Secondary | ICD-10-CM | POA: Insufficient documentation

## 2011-02-23 DIAGNOSIS — S82409A Unspecified fracture of shaft of unspecified fibula, initial encounter for closed fracture: Secondary | ICD-10-CM | POA: Insufficient documentation

## 2011-02-23 DIAGNOSIS — M25579 Pain in unspecified ankle and joints of unspecified foot: Secondary | ICD-10-CM | POA: Insufficient documentation

## 2011-02-23 DIAGNOSIS — E119 Type 2 diabetes mellitus without complications: Secondary | ICD-10-CM | POA: Insufficient documentation

## 2011-02-23 MED ORDER — OXYCODONE-ACETAMINOPHEN 5-325 MG PO TABS
2.0000 | ORAL_TABLET | Freq: Once | ORAL | Status: AC
  Administered 2011-02-23: 2 via ORAL
  Filled 2011-02-23: qty 2

## 2011-02-23 MED ORDER — HYDROCODONE-ACETAMINOPHEN 5-500 MG PO TABS: 1.0000 | ORAL_TABLET | Freq: Four times a day (QID) | ORAL | Status: AC | PRN

## 2011-02-23 NOTE — ED Provider Notes (Signed)
History     CSN: [id number]  Arrival date & time 02/23/11  1023   First MD Initiated Contact with Patient 02/23/11 1035      Chief Complaint  Patient presents with  . Ankle Pain    right    (Consider location/radiation/quality/duration/timing/severity/associated sxs/prior treatment) Patient is a 76 y.o. male presenting with ankle pain. The history is provided by the patient. No language interpreter was used.  Ankle Pain  The incident occurred 2 days ago. The incident occurred at home. The injury mechanism was a fall. The pain is present in the right ankle. The quality of the pain is described as aching. The pain is at a severity of 7/10. The pain is moderate. The pain has been constant since onset. Associated symptoms include inability to bear weight and loss of motion (secondary to pain). Pertinent negatives include no numbness, no muscle weakness, no loss of sensation and no tingling. He reports no foreign bodies present. The symptoms are aggravated by nothing. He has tried nothing for the symptoms. The treatment provided no relief.    Past Medical History  Diagnosis Date  . Heart attack   . Diabetes mellitus type II     Past Surgical History  Procedure Date  . Gallbladder surgery   . Back surgery   . Heart stents   . Heart bypass   . Finger surgery right little    Family History  Problem Relation Age of Onset  . Arthritis    . Cancer    . Diabetes      History  Substance Use Topics  . Smoking status: Never Smoker   . Smokeless tobacco: Not on file  . Alcohol Use: No      Review of Systems  Constitutional: Negative for fever and chills.  Respiratory: Negative for cough and shortness of breath.   Cardiovascular: Negative for chest pain and leg swelling.  Gastrointestinal: Negative for nausea, vomiting and diarrhea.  Neurological: Negative for tingling and numbness.  All other systems reviewed and are negative.    Allergies  Demerol  Home Medications     Current Outpatient Rx  Name Route Sig Dispense Refill  . ALLOPURINOL 300 MG PO TABS Oral Take 300 mg by mouth daily.      . ASPIRIN 81 MG PO TABS Oral Take 81 mg by mouth daily.      Marland Kitchen CALCIUM CARBONATE-VITAMIN D 500-200 MG-UNIT PO TABS Oral Take 1 tablet by mouth daily.      Marland Kitchen VITAMIN D 1000 UNITS PO TABS Oral Take 1,000 Units by mouth daily.      Marland Kitchen CLOPIDOGREL BISULFATE 75 MG PO TABS Oral Take 75 mg by mouth daily.      . COLESEVELAM HCL 625 MG PO TABS Oral Take 1,875 mg by mouth 2 (two) times daily with a meal.      . BYETTA 10 MCG PEN [place] Subcutaneous Inject into the skin.      . FUROSEMIDE 20 MG PO TABS Oral Take 20 mg by mouth 2 (two) times daily.      Marland Kitchen GABAPENTIN 100 MG PO CAPS  Three caps by mouth three times daily 270 capsule 5  . GLIPIZIDE 10 MG PO TABS Oral Take 10 mg by mouth 2 (two) times daily before a meal.      . HYDROCODONE-ACETAMINOPHEN 10-500 MG PO TABS      . INSULIN GLARGINE 100 UNIT/ML [place] SOLN Subcutaneous Inject into the skin at bedtime.      Marland Kitchen  LEVOTHROID PO Oral Take by mouth.      . METOPROLOL TARTRATE 25 MG PO TABS Oral Take 25 mg by mouth 2 (two) times daily.      Marland Kitchen NEURONTIN 300 MG PO CAPS  TAKE (1) CAPSULE BY MOUTH THREE TIMES A DAY. 90 each 2  . OMEPRAZOLE 20 MG PO CPDR Oral Take 20 mg by mouth daily.      Marland Kitchen SIMVASTATIN 40 MG PO TABS Oral Take 40 mg by mouth at bedtime.      Marland Kitchen [name] PHOSPHATE 50 MG PO TABS Oral Take 50 mg by mouth daily.      Marland Kitchen SPIRIVA HANDIHALER IN Inhalation Inhale into the lungs.        BP 119/45  Pulse 70  Temp(Src) 98.3 F (36.8 C) (Oral)  Resp 19  SpO2 100%  Physical Exam  Constitutional: He is oriented to person, place, and time. He appears well-developed and well-nourished. No distress.  HENT:  Head: Normocephalic and atraumatic.  Mouth/Throat: No oropharyngeal exudate.  Eyes: EOM are normal. Pupils are equal, round, and reactive to light.  Neck: Normal range of motion. Neck supple.  Cardiovascular: Normal rate and  regular rhythm.  Exam reveals no friction rub.   No murmur heard. Pulmonary/Chest: Effort normal and breath sounds normal. No respiratory distress. He has no wheezes. He has no rales.  Abdominal: He exhibits no distension. There is no tenderness. There is no rebound.  Musculoskeletal: He exhibits no edema.       Right ankle: He exhibits decreased range of motion (secondary to pain) and swelling. He exhibits no ecchymosis, no deformity and normal pulse. tenderness. Medial malleolus tenderness found.       Feet:  Neurological: He is alert and oriented to person, place, and time.  Skin: He is not diaphoretic.    ED Course  Procedures (including critical care time)  Labs Reviewed - No data to display       DG Ankle Complete Right (Final result)   Result time:02/23/11 1137    Final result by Rad Results In Interface (02/23/11 11:37:51)    Narrative:   *RADIOLOGY REPORT*  Clinical Data: Pain post fall  RIGHT ANKLE - COMPLETE 3+ VIEW  Comparison: None.  Findings: Three views of the right ankle submitted. There is diffuse osteopenia. Ankle mortise is preserved. There is oblique nondisplaced fracture in the distal right fibula. Posterior and plantar spurring of the calcaneus. Atherosclerotic vascular calcifications are noted.  IMPRESSION: Oblique nondisplaced fracture in distal right fibular shaft. Diffuse osteopenia.  Original Report Authenticated By: LIVIU POP,      1. Fracture of right fibula, shaft      MDM  Patient is a 77 year old old male presenting with ankle pain. Patient tripped and fell 2 days ago. He has been unable to ambulate for the past 2 days. He did not hear a pop in his ankle when he fell. He continued to have pain today; he knows to bring him to the emergency room for evaluation. Patient denies fevers, nausea/vomiting, chest pain, diarrhea. Patient is afebrile vital signs are stable. Right ankle is mildly swollen with no gross deformities. Joint is  stable. She has 2+ DP pulses. Cap refill is less than 2 seconds. Patient is able to move foot however range of motion is limited secondary to pain. No proximal fibular tenderness Will x-ray ankle to determine if any fractures are present. X-ray shows nondisplaced distal fibular fracture. X-ray reviewed by me. Patient placed in a splint however  unable to ambulate with the crutches and splint. Splint taken off and placed in a fracture boot. Patient has a walker at home to use for ambulation. Given pain medicines and given number for follow up in one week. Discharge home in stable condition   Elwin Mocha, MD 02/23/11 1433

## 2011-02-23 NOTE — ED Notes (Signed)
Per ems- pt c/o right ankle pain. Pt fell and twisted ankle on 12/25. Minimal swelling noted to ankle, pulses present. Pt c/o pain 10/10.

## 2011-02-23 NOTE — ED Notes (Signed)
Patient given sandwich and milk.

## 2011-02-23 NOTE — ED Notes (Signed)
Pt d/c home with family. Ortho tech taught pt how to use cam walker. NAD noted.

## 2011-02-23 NOTE — Progress Notes (Signed)
Orthopedic Tech Progress Note Patient Details:  LABIB CWYNAR 1934-02-27 [id number]  Other Ortho Devices Type of Ortho Device: Crutches   Gaye Pollack 02/23/2011, 12:53 PM

## 2011-02-25 NOTE — ED Provider Notes (Signed)
I saw and evaluated the patient, reviewed the resident's note and I agree with the findings and plan. S/p twisting injury to right ankle. Lat and med mall tenderness. Ankle grossly stable. Distal pulses palp. fx on xry.   Suzi Roots, MD 02/25/11 [phone]

## 2011-03-29 ENCOUNTER — Other Ambulatory Visit: Payer: Self-pay | Admitting: Pulmonary Disease

## 2011-03-31 ENCOUNTER — Encounter (HOSPITAL_COMMUNITY): Payer: Self-pay | Admitting: Pharmacy Technician

## 2011-04-05 ENCOUNTER — Encounter (HOSPITAL_COMMUNITY): Payer: Self-pay

## 2011-04-05 ENCOUNTER — Encounter (HOSPITAL_COMMUNITY)
Admission: RE | Admit: 2011-04-05 | Discharge: 2011-04-05 | Disposition: A | Discharge status: Home / Self Care | Payer: Medicare Other | Source: Ambulatory Visit | Attending: Neurosurgery | Admitting: Neurosurgery

## 2011-04-05 HISTORY — DX: Gastro-esophageal reflux disease without esophagitis: K21.9

## 2011-04-05 HISTORY — DX: Pneumonia, unspecified organism: J18.9

## 2011-04-05 HISTORY — DX: Unspecified osteoarthritis, unspecified site: M19.90

## 2011-04-05 HISTORY — DX: Chronic obstructive pulmonary disease, unspecified: J44.9

## 2011-04-05 HISTORY — DX: Hypothyroidism, unspecified: E03.9

## 2011-04-05 HISTORY — DX: Atherosclerotic heart disease of native coronary artery without angina pectoris: I25.10

## 2011-04-05 LAB — CBC
Hemoglobin: 12.5 g/dL — ABNORMAL LOW (ref 13.0–17.0)
MCH: 32.5 pg (ref 26.0–34.0)
MCHC: 33.1 g/dL (ref 30.0–36.0)
MCV: 98.2 fL (ref 78.0–100.0)
Platelets: 221 10*3/uL (ref 150–400)

## 2011-04-05 LAB — BASIC METABOLIC PANEL WITH GFR
BUN: 17 mg/dL (ref 6–23)
CO2: 27 meq/L (ref 19–32)
Calcium: 9.6 mg/dL (ref 8.4–10.5)
Chloride: 104 meq/L (ref 96–112)
Creatinine, Ser: 1.53 mg/dL — ABNORMAL HIGH (ref 0.50–1.35)
GFR calc Af Amer: 50 mL/min — ABNORMAL LOW
GFR calc non Af Amer: 43 mL/min — ABNORMAL LOW
Glucose, Bld: 151 mg/dL — ABNORMAL HIGH (ref 70–99)
Potassium: 4.6 meq/L (ref 3.5–5.1)
Sodium: 142 meq/L (ref 135–145)

## 2011-04-05 NOTE — Pre-Procedure Instructions (Signed)
20 NAFIS FARNAN  04/05/2011   Your procedure is scheduled on: 04-10-2010 @ 10:00 AM Report to [facility] at 7:00 AM.  Call this number if you have problems the morning of surgery: [phone]   Remember:   Do not eat food:After Midnight.  May have clear liquids: up to 4 Hours before arrival.  Clear liquids include soda, tea, black coffee, apple or grape juice, broth.until 3:00 AM  Take these medicines the morning of surgery with A SIP OF WATER:neurontin,hydrocodone;levothyroxine,metoprolol,omeprozole,spiriva   Do not wear jewelry, make-up or nail polish.  Do not wear lotions, powders, or perfumes. You may wear deodorant.  Do not shave 48 hours prior to surgery.  Do not bring valuables to the hospital.  Contacts, dentures or bridgework may not be worn into surgery.  Leave suitcase in the car. After surgery it may be brought to your room.  For patients admitted to the hospital, checkout time is 11:00 AM the day of discharge.      Special Instructions: CHG Shower Use Special Wash: 1/2 bottle night before surgery and 1/2 bottle morning of surgery.   Please read over the following fact sheets that you were given: Pain Booklet, MRSA Information and Surgical Site Infection Prevention

## 2011-04-10 MED ORDER — CEFAZOLIN SODIUM-DEXTROSE 2-3 GM-% IV SOLR
2.0000 g | INTRAVENOUS | Status: AC
  Administered 2011-04-11: 2 g via INTRAVENOUS
  Filled 2011-04-10: qty 50

## 2011-04-11 ENCOUNTER — Encounter (HOSPITAL_COMMUNITY): Payer: Self-pay | Admitting: Anesthesiology

## 2011-04-11 ENCOUNTER — Encounter (HOSPITAL_COMMUNITY)
Admission: RE | Disposition: A | Discharge status: Home / Self Care | Payer: Self-pay | Source: Ambulatory Visit | Attending: Neurosurgery

## 2011-04-11 ENCOUNTER — Inpatient Hospital Stay (HOSPITAL_COMMUNITY)
Admission: RE | Admit: 2011-04-11 | Discharge: 2011-04-12 | DRG: 491 | Disposition: A | Discharge status: Home / Self Care | Payer: Medicare Other | Source: Ambulatory Visit | Attending: Neurosurgery | Admitting: Neurosurgery

## 2011-04-11 ENCOUNTER — Inpatient Hospital Stay (HOSPITAL_COMMUNITY): Payer: Medicare Other | Admitting: Anesthesiology

## 2011-04-11 ENCOUNTER — Encounter (HOSPITAL_COMMUNITY): Payer: Self-pay | Admitting: *Deleted

## 2011-04-11 ENCOUNTER — Inpatient Hospital Stay (HOSPITAL_COMMUNITY): Payer: Medicare Other

## 2011-04-11 DIAGNOSIS — M4120 Other idiopathic scoliosis, site unspecified: Secondary | ICD-10-CM | POA: Diagnosis present

## 2011-04-11 DIAGNOSIS — K219 Gastro-esophageal reflux disease without esophagitis: Secondary | ICD-10-CM | POA: Diagnosis present

## 2011-04-11 DIAGNOSIS — Z79899 Other long term (current) drug therapy: Secondary | ICD-10-CM

## 2011-04-11 DIAGNOSIS — I2510 Atherosclerotic heart disease of native coronary artery without angina pectoris: Secondary | ICD-10-CM | POA: Diagnosis present

## 2011-04-11 DIAGNOSIS — Z951 Presence of aortocoronary bypass graft: Secondary | ICD-10-CM

## 2011-04-11 DIAGNOSIS — Z9861 Coronary angioplasty status: Secondary | ICD-10-CM

## 2011-04-11 DIAGNOSIS — Z01812 Encounter for preprocedural laboratory examination: Secondary | ICD-10-CM

## 2011-04-11 DIAGNOSIS — E119 Type 2 diabetes mellitus without complications: Secondary | ICD-10-CM | POA: Diagnosis present

## 2011-04-11 DIAGNOSIS — Z7902 Long term (current) use of antithrombotics/antiplatelets: Secondary | ICD-10-CM

## 2011-04-11 DIAGNOSIS — J4489 Other specified chronic obstructive pulmonary disease: Secondary | ICD-10-CM | POA: Diagnosis present

## 2011-04-11 DIAGNOSIS — Z794 Long term (current) use of insulin: Secondary | ICD-10-CM

## 2011-04-11 DIAGNOSIS — Q762 Congenital spondylolisthesis: Secondary | ICD-10-CM

## 2011-04-11 DIAGNOSIS — I509 Heart failure, unspecified: Secondary | ICD-10-CM | POA: Diagnosis present

## 2011-04-11 DIAGNOSIS — I10 Essential (primary) hypertension: Secondary | ICD-10-CM | POA: Diagnosis present

## 2011-04-11 DIAGNOSIS — M7130 Other bursal cyst, unspecified site: Principal | ICD-10-CM | POA: Diagnosis present

## 2011-04-11 DIAGNOSIS — G4730 Sleep apnea, unspecified: Secondary | ICD-10-CM | POA: Diagnosis present

## 2011-04-11 DIAGNOSIS — I252 Old myocardial infarction: Secondary | ICD-10-CM

## 2011-04-11 DIAGNOSIS — Z7982 Long term (current) use of aspirin: Secondary | ICD-10-CM

## 2011-04-11 DIAGNOSIS — J449 Chronic obstructive pulmonary disease, unspecified: Secondary | ICD-10-CM | POA: Diagnosis present

## 2011-04-11 DIAGNOSIS — E039 Hypothyroidism, unspecified: Secondary | ICD-10-CM | POA: Diagnosis present

## 2011-04-11 HISTORY — PX: LUMBAR LAMINECTOMY/DECOMPRESSION MICRODISCECTOMY: SHX5026

## 2011-04-11 LAB — GLUCOSE, CAPILLARY
Glucose-Capillary: 156 mg/dL — ABNORMAL HIGH (ref 70–99)
Glucose-Capillary: 95 mg/dL (ref 70–99)
Glucose-Capillary: 195 mg/dL — ABNORMAL HIGH (ref 70–99)

## 2011-04-11 SURGERY — LUMBAR LAMINECTOMY/DECOMPRESSION MICRODISCECTOMY 1 LEVEL
Anesthesia: General | Laterality: Right | Wound class: Clean

## 2011-04-11 MED ORDER — PANTOPRAZOLE SODIUM 40 MG PO TBEC
40.0000 mg | DELAYED_RELEASE_TABLET | Freq: Every day | ORAL | Status: DC
  Administered 2011-04-11: 40 mg via ORAL
  Filled 2011-04-11: qty 1

## 2011-04-11 MED ORDER — GABAPENTIN 300 MG PO CAPS
300.0000 mg | ORAL_CAPSULE | Freq: Three times a day (TID) | ORAL | Status: DC
  Administered 2011-04-11 – 2011-04-12 (×3): 300 mg via ORAL
  Filled 2011-04-11 (×6): qty 1

## 2011-04-11 MED ORDER — VECURONIUM BROMIDE 10 MG IV SOLR
INTRAVENOUS | Status: DC | PRN
  Administered 2011-04-11: 10 mg via INTRAVENOUS

## 2011-04-11 MED ORDER — TIOTROPIUM BROMIDE MONOHYDRATE 18 MCG IN CAPS
18.0000 ug | ORAL_CAPSULE | Freq: Every day | RESPIRATORY_TRACT | Status: DC
  Administered 2011-04-12: 18 ug via RESPIRATORY_TRACT
  Filled 2011-04-11: qty 5

## 2011-04-11 MED ORDER — PROPOFOL 10 MG/ML IV EMUL
INTRAVENOUS | Status: DC | PRN
  Administered 2011-04-11: 150 mg via INTRAVENOUS

## 2011-04-11 MED ORDER — ASPIRIN 81 MG PO TABS: 81.0000 mg | ORAL_TABLET | Freq: Every day | ORAL | Status: DC

## 2011-04-11 MED ORDER — FUROSEMIDE 40 MG PO TABS: 40.0000 mg | ORAL_TABLET | ORAL | Status: DC

## 2011-04-11 MED ORDER — LINAGLIPTIN 5 MG PO TABS
5.0000 mg | ORAL_TABLET | Freq: Every day | ORAL | Status: DC
  Administered 2011-04-11 – 2011-04-12 (×2): 5 mg via ORAL
  Filled 2011-04-11 (×2): qty 1

## 2011-04-11 MED ORDER — FENTANYL CITRATE 0.05 MG/ML IJ SOLN
INTRAMUSCULAR | Status: AC
  Filled 2011-04-11: qty 2

## 2011-04-11 MED ORDER — METOPROLOL TARTRATE 25 MG PO TABS
25.0000 mg | ORAL_TABLET | Freq: Two times a day (BID) | ORAL | Status: DC
  Administered 2011-04-11 – 2011-04-12 (×2): 25 mg via ORAL
  Filled 2011-04-11 (×4): qty 1

## 2011-04-11 MED ORDER — SODIUM CHLORIDE 0.9 % IJ SOLN: 3.0000 mL | Freq: Two times a day (BID) | INTRAMUSCULAR | Status: DC

## 2011-04-11 MED ORDER — HYDROCODONE-ACETAMINOPHEN 5-325 MG PO TABS: 1.0000 | ORAL_TABLET | ORAL | Status: DC | PRN

## 2011-04-11 MED ORDER — INSULIN ASPART 100 UNIT/ML ~~LOC~~ SOLN
0.0000 [IU] | Freq: Three times a day (TID) | SUBCUTANEOUS | Status: DC
  Filled 2011-04-11: qty 3

## 2011-04-11 MED ORDER — INSULIN ASPART 100 UNIT/ML ~~LOC~~ SOLN: 0.0000 [IU] | Freq: Every day | SUBCUTANEOUS | Status: DC

## 2011-04-11 MED ORDER — ONDANSETRON HCL 4 MG/2ML IJ SOLN: 4.0000 mg | INTRAMUSCULAR | Status: DC | PRN

## 2011-04-11 MED ORDER — DOCUSATE SODIUM 100 MG PO CAPS
100.0000 mg | ORAL_CAPSULE | Freq: Two times a day (BID) | ORAL | Status: DC
  Filled 2011-04-11: qty 1

## 2011-04-11 MED ORDER — SODIUM CHLORIDE 0.9 % IV SOLN
INTRAVENOUS | Status: AC
  Filled 2011-04-11: qty 500

## 2011-04-11 MED ORDER — ACETAMINOPHEN 325 MG PO TABS: 650.0000 mg | ORAL_TABLET | ORAL | Status: DC | PRN

## 2011-04-11 MED ORDER — SODIUM CHLORIDE 0.9 % IJ SOLN: 3.0000 mL | INTRAMUSCULAR | Status: DC | PRN

## 2011-04-11 MED ORDER — ROSUVASTATIN CALCIUM 20 MG PO TABS
20.0000 mg | ORAL_TABLET | Freq: Every day | ORAL | Status: DC
  Administered 2011-04-11: 20 mg via ORAL
  Filled 2011-04-11 (×2): qty 1

## 2011-04-11 MED ORDER — CEFAZOLIN SODIUM 1-5 GM-% IV SOLN
1.0000 g | Freq: Three times a day (TID) | INTRAVENOUS | Status: AC
  Administered 2011-04-11 (×2): 1 g via INTRAVENOUS
  Filled 2011-04-11 (×2): qty 50

## 2011-04-11 MED ORDER — METHYLPREDNISOLONE ACETATE 80 MG/ML IJ SUSP
INTRAMUSCULAR | Status: DC | PRN
  Administered 2011-04-11: 80 mg

## 2011-04-11 MED ORDER — PHENYLEPHRINE HCL 10 MG/ML IJ SOLN
INTRAMUSCULAR | Status: DC | PRN
  Administered 2011-04-11: 80 ug via INTRAVENOUS

## 2011-04-11 MED ORDER — COLESEVELAM HCL 625 MG PO TABS
1875.0000 mg | ORAL_TABLET | Freq: Every day | ORAL | Status: DC
  Administered 2011-04-11 – 2011-04-12 (×2): 1875 mg via ORAL
  Filled 2011-04-11 (×2): qty 3

## 2011-04-11 MED ORDER — GLIPIZIDE 10 MG PO TABS
10.0000 mg | ORAL_TABLET | Freq: Two times a day (BID) | ORAL | Status: DC
  Administered 2011-04-11 – 2011-04-12 (×2): 10 mg via ORAL
  Filled 2011-04-11 (×5): qty 1

## 2011-04-11 MED ORDER — LACTATED RINGERS IV SOLN
INTRAVENOUS | Status: DC | PRN
  Administered 2011-04-11 (×3): via INTRAVENOUS

## 2011-04-11 MED ORDER — EXENATIDE 10 MCG/0.04ML ~~LOC~~ SOPN
10.0000 ug | PEN_INJECTOR | Freq: Two times a day (BID) | SUBCUTANEOUS | Status: DC
  Filled 2011-04-11 (×4): qty 0.04

## 2011-04-11 MED ORDER — OXYCODONE-ACETAMINOPHEN 5-325 MG PO TABS
1.0000 | ORAL_TABLET | ORAL | Status: DC | PRN
  Administered 2011-04-11 – 2011-04-12 (×2): 2 via ORAL
  Filled 2011-04-11 (×2): qty 2

## 2011-04-11 MED ORDER — HYDROMORPHONE HCL PF 1 MG/ML IJ SOLN
INTRAMUSCULAR | Status: AC
  Administered 2011-04-11: 0.5 mg via INTRAVENOUS
  Filled 2011-04-11: qty 1

## 2011-04-11 MED ORDER — CALCIUM CARBONATE-VITAMIN D 500-200 MG-UNIT PO TABS
1.0000 | ORAL_TABLET | Freq: Two times a day (BID) | ORAL | Status: DC
  Administered 2011-04-11 – 2011-04-12 (×3): 1 via ORAL
  Filled 2011-04-11 (×5): qty 1

## 2011-04-11 MED ORDER — ASPIRIN 81 MG PO CHEW
81.0000 mg | CHEWABLE_TABLET | Freq: Every day | ORAL | Status: DC
  Administered 2011-04-12: 81 mg via ORAL
  Filled 2011-04-11: qty 1

## 2011-04-11 MED ORDER — HYDROMORPHONE HCL PF 1 MG/ML IJ SOLN
0.2500 mg | INTRAMUSCULAR | Status: DC | PRN
  Administered 2011-04-11: 0.5 mg via INTRAVENOUS

## 2011-04-11 MED ORDER — EPHEDRINE SULFATE 50 MG/ML IJ SOLN
INTRAMUSCULAR | Status: DC | PRN
  Administered 2011-04-11: 5 mg via INTRAVENOUS
  Administered 2011-04-11 (×3): 10 mg via INTRAVENOUS

## 2011-04-11 MED ORDER — FENTANYL CITRATE 0.05 MG/ML IJ SOLN
INTRAMUSCULAR | Status: DC | PRN
  Administered 2011-04-11: 100 ug via INTRAVENOUS
  Administered 2011-04-11: 50 ug via INTRAVENOUS

## 2011-04-11 MED ORDER — ACETAMINOPHEN 650 MG RE SUPP: 650.0000 mg | RECTAL | Status: DC | PRN

## 2011-04-11 MED ORDER — LEVOTHYROXINE SODIUM 150 MCG PO TABS
150.0000 ug | ORAL_TABLET | Freq: Every day | ORAL | Status: DC
  Administered 2011-04-12: 150 ug via ORAL
  Filled 2011-04-11 (×2): qty 1

## 2011-04-11 MED ORDER — KCL IN DEXTROSE-NACL 20-5-0.45 MEQ/L-%-% IV SOLN
INTRAVENOUS | Status: DC
  Filled 2011-04-11 (×3): qty 1000

## 2011-04-11 MED ORDER — FUROSEMIDE 20 MG PO TABS: 20.0000 mg | ORAL_TABLET | Freq: Every day | ORAL | Status: DC

## 2011-04-11 MED ORDER — MENTHOL 3 MG MT LOZG: 1.0000 | LOZENGE | OROMUCOSAL | Status: DC | PRN

## 2011-04-11 MED ORDER — INSULIN GLARGINE 100 UNIT/ML ~~LOC~~ SOLN
50.0000 [IU] | Freq: Every day | SUBCUTANEOUS | Status: DC
  Administered 2011-04-11 – 2011-04-12 (×2): 50 [IU] via SUBCUTANEOUS
  Filled 2011-04-11: qty 3

## 2011-04-11 MED ORDER — SODIUM CHLORIDE 0.9 % IR SOLN
Status: DC | PRN
  Administered 2011-04-11: 11:00:00

## 2011-04-11 MED ORDER — VITAMIN D3 25 MCG (1000 UT) PO CAPS: 1.0000 | ORAL_CAPSULE | Freq: Two times a day (BID) | ORAL | Status: DC

## 2011-04-11 MED ORDER — BUPIVACAINE HCL (PF) 0.5 % IJ SOLN
INTRAMUSCULAR | Status: DC | PRN
  Administered 2011-04-11: 10 mL

## 2011-04-11 MED ORDER — ALLOPURINOL 150 MG HALF TABLET
150.0000 mg | ORAL_TABLET | Freq: Every day | ORAL | Status: DC
  Administered 2011-04-11: 150 mg via ORAL
  Filled 2011-04-11 (×2): qty 1

## 2011-04-11 MED ORDER — BACITRACIN 50000 UNITS IM SOLR
INTRAMUSCULAR | Status: AC
  Filled 2011-04-11: qty 1

## 2011-04-11 MED ORDER — LIDOCAINE-EPINEPHRINE 1 %-1:100000 IJ SOLN
INTRAMUSCULAR | Status: DC | PRN
  Administered 2011-04-11: 10 mL

## 2011-04-11 MED ORDER — NON FORMULARY
Status: DC | PRN
  Administered 2011-04-11: 100 ug

## 2011-04-11 MED ORDER — SODIUM CHLORIDE 0.9 % IV SOLN
INTRAVENOUS | Status: DC | PRN
  Administered 2011-04-11: 11:00:00 via INTRAVENOUS

## 2011-04-11 MED ORDER — GLYCOPYRROLATE 0.2 MG/ML IJ SOLN
INTRAMUSCULAR | Status: DC | PRN
  Administered 2011-04-11: .7 mg via INTRAVENOUS

## 2011-04-11 MED ORDER — THROMBIN 20000 UNITS EX KIT
PACK | CUTANEOUS | Status: DC | PRN
  Administered 2011-04-11: 11:00:00 via TOPICAL

## 2011-04-11 MED ORDER — VANCOMYCIN HCL 1000 MG IV SOLR: 1500.0000 mg | INTRAVENOUS | Status: DC

## 2011-04-11 MED ORDER — PROMETHAZINE HCL 25 MG/ML IJ SOLN: 6.2500 mg | INTRAMUSCULAR | Status: DC | PRN

## 2011-04-11 MED ORDER — MORPHINE SULFATE 4 MG/ML IJ SOLN: 1.0000 mg | INTRAMUSCULAR | Status: DC | PRN

## 2011-04-11 MED ORDER — VANCOMYCIN HCL 1000 MG IV SOLR
1500.0000 mg | INTRAVENOUS | Status: AC
  Administered 2011-04-11: 1500 mg via INTRAVENOUS
  Filled 2011-04-11: qty 1500

## 2011-04-11 MED ORDER — PHENOL 1.4 % MT LIQD: 1.0000 | OROMUCOSAL | Status: DC | PRN

## 2011-04-11 MED ORDER — FUROSEMIDE 20 MG PO TABS
20.0000 mg | ORAL_TABLET | ORAL | Status: DC
Start: 2011-04-12 — End: 2011-04-12
  Administered 2011-04-12: 20 mg via ORAL
  Filled 2011-04-11: qty 1

## 2011-04-11 MED ORDER — DIAZEPAM 5 MG PO TABS: 5.0000 mg | ORAL_TABLET | Freq: Four times a day (QID) | ORAL | Status: DC | PRN

## 2011-04-11 MED ORDER — NEOSTIGMINE METHYLSULFATE 1 MG/ML IJ SOLN
INTRAMUSCULAR | Status: DC | PRN
  Administered 2011-04-11: 5 mg via INTRAVENOUS

## 2011-04-11 MED ORDER — VITAMIN D3 25 MCG (1000 UNIT) PO TABS
1000.0000 [IU] | ORAL_TABLET | Freq: Two times a day (BID) | ORAL | Status: DC
  Administered 2011-04-11 – 2011-04-12 (×3): 1000 [IU] via ORAL
  Filled 2011-04-11 (×4): qty 1

## 2011-04-11 MED ORDER — 0.9 % SODIUM CHLORIDE (POUR BTL) OPTIME
TOPICAL | Status: DC | PRN
  Administered 2011-04-11: 1000 mL

## 2011-04-11 MED FILL — Fentanyl Citrate Inj 0.05 MG/ML: INTRAMUSCULAR | Qty: 2 | Status: AC

## 2011-04-11 SURGICAL SUPPLY — 64 items
ADH SKN CLS APL DERMABOND .7 (GAUZE/BANDAGES/DRESSINGS) ×1
APL SKNCLS STERI-STRIP NONHPOA (GAUZE/BANDAGES/DRESSINGS)
BAG DECANTER FOR FLEXI CONT (MISCELLANEOUS) ×2 IMPLANT
BENZOIN TINCTURE PRP APPL 2/3 (GAUZE/BANDAGES/DRESSINGS) IMPLANT
BIT DRILL NEURO 2X3.1 SFT TUCH (MISCELLANEOUS) ×1 IMPLANT
BLADE SURG ROTATE 9660 (MISCELLANEOUS) IMPLANT
BUR ROUND FLUTED 5 RND (BURR) ×2 IMPLANT
CANISTER SUCTION 2500CC (MISCELLANEOUS) ×2 IMPLANT
CLOTH BEACON ORANGE TIMEOUT ST (SAFETY) ×2 IMPLANT
CONT SPEC 4OZ CLIKSEAL STRL BL (MISCELLANEOUS) ×2 IMPLANT
DERMABOND ADVANCED (GAUZE/BANDAGES/DRESSINGS) ×1
DERMABOND ADVANCED .7 DNX12 (GAUZE/BANDAGES/DRESSINGS) ×1 IMPLANT
DRAPE LAPAROTOMY 100X72X124 (DRAPES) ×2 IMPLANT
DRAPE MICROSCOPE LEICA (MISCELLANEOUS) ×2 IMPLANT
DRAPE POUCH INSTRU U-SHP 10X18 (DRAPES) ×2 IMPLANT
DRAPE SURG 17X23 STRL (DRAPES) ×2 IMPLANT
DRESSING TELFA 8X3 (GAUZE/BANDAGES/DRESSINGS) IMPLANT
DRILL NEURO 2X3.1 SOFT TOUCH (MISCELLANEOUS) ×2
DURAPREP 26ML APPLICATOR (WOUND CARE) ×2 IMPLANT
ELECT BLADE 4.0 EZ CLEAN MEGAD (MISCELLANEOUS) ×2
ELECT REM PT RETURN 9FT ADLT (ELECTROSURGICAL) ×2
ELECTRODE BLDE 4.0 EZ CLN MEGD (MISCELLANEOUS) ×1 IMPLANT
ELECTRODE REM PT RTRN 9FT ADLT (ELECTROSURGICAL) ×1 IMPLANT
GAUZE SPONGE 4X4 16PLY XRAY LF (GAUZE/BANDAGES/DRESSINGS) IMPLANT
GLOVE BIO SURGEON STRL SZ8 (GLOVE) ×2 IMPLANT
GLOVE BIOGEL PI IND STRL 7.5 (GLOVE) ×1 IMPLANT
GLOVE BIOGEL PI IND STRL 8 (GLOVE) ×1 IMPLANT
GLOVE BIOGEL PI IND STRL 8.5 (GLOVE) ×2 IMPLANT
GLOVE BIOGEL PI INDICATOR 7.5 (GLOVE) ×1
GLOVE BIOGEL PI INDICATOR 8 (GLOVE) ×1
GLOVE BIOGEL PI INDICATOR 8.5 (GLOVE) ×2
GLOVE ECLIPSE 8.0 STRL XLNG CF (GLOVE) ×2 IMPLANT
GLOVE ECLIPSE 8.5 STRL (GLOVE) ×2 IMPLANT
GLOVE EXAM NITRILE LRG STRL (GLOVE) IMPLANT
GLOVE EXAM NITRILE MD LF STRL (GLOVE) IMPLANT
GLOVE EXAM NITRILE XL STR (GLOVE) IMPLANT
GLOVE EXAM NITRILE XS STR PU (GLOVE) IMPLANT
GLOVE SS N UNI LF 7.0 STRL (GLOVE) ×4 IMPLANT
GOWN BRE IMP SLV AUR LG STRL (GOWN DISPOSABLE) ×2 IMPLANT
GOWN BRE IMP SLV AUR XL STRL (GOWN DISPOSABLE) ×2 IMPLANT
GOWN STRL REIN 2XL LVL4 (GOWN DISPOSABLE) ×4 IMPLANT
KIT BASIN OR (CUSTOM PROCEDURE TRAY) ×2 IMPLANT
KIT ROOM TURNOVER OR (KITS) ×2 IMPLANT
NEEDLE HYPO 18GX1.5 BLUNT FILL (NEEDLE) IMPLANT
NEEDLE HYPO 25X1 1.5 SAFETY (NEEDLE) ×2 IMPLANT
NS IRRIG 1000ML POUR BTL (IV SOLUTION) ×2 IMPLANT
PACK LAMINECTOMY NEURO (CUSTOM PROCEDURE TRAY) ×2 IMPLANT
PAD ARMBOARD 7.5X6 YLW CONV (MISCELLANEOUS) ×6 IMPLANT
RUBBERBAND STERILE (MISCELLANEOUS) ×4 IMPLANT
SLEEVE SURGEON STRL (DRAPES) ×2 IMPLANT
SPONGE GAUZE 4X4 12PLY (GAUZE/BANDAGES/DRESSINGS) IMPLANT
SPONGE SURGIFOAM ABS GEL SZ50 (HEMOSTASIS) IMPLANT
STAPLER SKIN PROX WIDE 3.9 (STAPLE) IMPLANT
STRIP CLOSURE SKIN 1/2X4 (GAUZE/BANDAGES/DRESSINGS) IMPLANT
SUT VIC AB 0 CT1 18XCR BRD8 (SUTURE) ×1 IMPLANT
SUT VIC AB 0 CT1 8-18 (SUTURE) ×1
SUT VIC AB 2-0 CT1 18 (SUTURE) ×2 IMPLANT
SUT VIC AB 3-0 SH 8-18 (SUTURE) ×2 IMPLANT
SYR 20CC LL (SYRINGE) ×2 IMPLANT
SYR 20ML ECCENTRIC (SYRINGE) ×2 IMPLANT
SYR 5ML LL (SYRINGE) IMPLANT
TOWEL OR 17X24 6PK STRL BLUE (TOWEL DISPOSABLE) ×2 IMPLANT
TOWEL OR 17X26 10 PK STRL BLUE (TOWEL DISPOSABLE) ×2 IMPLANT
WATER STERILE IRR 1000ML POUR (IV SOLUTION) ×2 IMPLANT

## 2011-04-11 NOTE — Op Note (Signed)
04/11/2011  12:16 PM  PATIENT:  [name]  78 y.o. male  PRE-OPERATIVE DIAGNOSIS:  synovial cyst lumbar stenosis scoliosis spondylolisthesis L3/4 right  POST-OPERATIVE DIAGNOSIS:  synovial cyst lumbar stenosis scoliosis spondylolisthesis L3/4 right  PROCEDURE:  Procedure(s) (LRB): LUMBAR LAMINECTOMY/DECOMPRESSION Resection of synovial cyst with microdissection L3/4 (Right)  SURGEON:  Surgeon(s) and Role:    * [name], MD - Primary    * Stefani Dama, MD - Assisting  PHYSICIAN ASSISTANT:   ASSISTANTS: Poteat, RN   ANESTHESIA:   general  EBL:  Total I/O In: 1250 [I.V.:1250] Out: -   BLOOD ADMINISTERED:none  DRAINS: none   LOCAL MEDICATIONS USED:  LIDOCAINE   SPECIMEN:  No Specimen  DISPOSITION OF SPECIMEN:  N/A  COUNTS:  YES  TOURNIQUET:  * No tourniquets in log *  DICTATION: DICTATION: Patient has a large L3/4 synovial cyst on the right with significant right leg weakness and pain. It was elected to take him to surgery for right L3/4 laminectomy and resection of synovial cyst.  Procedure: Patient was brought to the operating room and following the smooth and uncomplicated induction of general endotracheal anesthesia he was placed in a prone position on the Wilson frame. Low back was prepped and draped in the usual sterile fashion with DuraPrep. Area of planned incision was infiltrated with local lidocaine. Incision was made in the midline and carried to the lumbodorsal fascia which was incised on the right side of midline. Subperiosteal dissection was performed exposing what was felt to be L3/4 level. Intraoperative x-ray demonstrated marker probe at L3-4 .  A hemi-semi-laminectomy of L3 was performed a high-speed drill and completed with Kerrison rongeurs and a generous foraminotomy was performed overlying the superior aspect of the L4 lamina. Ligamentum flavum was detached and removed in a piecemeal fashion and the L4 nerve root was decompressed laterally with  removal of the superior aspect of the facet and ligamentum causing nerve root compression. The microscope was brought into the field and the synovial cyst was mobilized and removed. This exposed a large amount of synovial cyst material which was removed. Back angled curettes and Kerrison rongeurs were used to remove the remaining cyst material and ligamentous tissue. At this point it was felt that all neural elements were well decompressed. Hemostasis was assured with bipolar electrocautery and the wound was irrigated with Depo-Medrol and fentanyl. The lumbodorsal fascia was closed with 0 Vicryl sutures the subcutaneous tissues reapproximated 2-0 Vicryl inverted sutures and the skin edges were reapproximated with 3-0 Vicryl subcuticular stitch. The wound is dressed with Dermabond. Patient was extubated in the operating room and taken to recovery in stable and satisfactory condition having tolerated his operation well counts were correct at the end of the case.  PLAN OF CARE: Admit for overnight observation  PATIENT DISPOSITION:  PACU - hemodynamically stable.   Delay start of Pharmacological VTE agent (>24hrs) due to surgical blood loss or risk of bleeding: YES

## 2011-04-11 NOTE — Progress Notes (Signed)
Subjective: Patient reports "He did a fine job! I can already walk further without hurting."  Objective: Vital signs in last 24 hours: Temp:  [97.3 F (36.3 C)-98.2 F (36.8 C)] 97.3 F (36.3 C) (02/12 1335) Pulse Rate:  [55-78] 55  (02/12 1335) Resp:  [18-24] 19  (02/12 1335) BP: (114-163)/(42-65) 151/65 mmHg (02/12 1252) SpO2:  [94 %-100 %] 99 % (02/12 1335)  Intake/Output from previous day:   Intake/Output this shift: Total I/O In: 1970 [P.O.:720; I.V.:1250] Out: -   Alert, conversant. Family at bedside. Good strength BLE. Incision with Dermabond; no erythema, swelling, or drainage.   Lab Results: No results found for this basename: WBC:2,HGB:2,HCT:2,PLT:2 in the last 72 hours BMET No results found for this basename: NA:2,K:2,CL:2,CO2:2,GLUCOSE:2,BUN:2,CREATININE:2,CALCIUM:2 in the last 72 hours  Studies/Results: Dg Lumbar Spine 1 View  04/11/2011  *RADIOLOGY REPORT*  Clinical Data: Intraoperative localization for lumbar surgery.  LUMBAR SPINE - 1 VIEW  Comparison: MRI lumbar spine 02/22/2011.  Findings: Lateral lumbar spine film labeled #1 demonstrates a surgical instrument marking the L3-4 disc space level.  IMPRESSION: L3-4 marked intraoperatively.  Original Report Authenticated By: P. Loralie Champagne, M.D.    Assessment/Plan: Improved  LOS: 0 days  Mobilize today with expectation of d/c to home tomorrow.   Georgiann Cocker 04/11/2011, 3:46 PM

## 2011-04-11 NOTE — Anesthesia Postprocedure Evaluation (Signed)
  Anesthesia Post-op Note  Patient: [name]  Procedure(s) Performed: Procedure(s) (LRB): LUMBAR LAMINECTOMY/DECOMPRESSION MICRODISCECTOMY 1 LEVEL (Right)  Patient Location: PACU  Anesthesia Type: General  Level of Consciousness: awake and oriented  Airway and Oxygen Therapy: Patient Spontanous Breathing  Post-op Pain: mild  Post-op Assessment: Post-op Vital signs reviewed and Patient's Cardiovascular Status Stable  Post-op Vital Signs: stable  Complications: No apparent anesthesia complications

## 2011-04-11 NOTE — H&P (Signed)
[name]  #[id number]  DOB:  [birth year]  03/29/2011:  [name] comes in today to review his MRI of his lumbar spine which shows that he has a large synovial cyst at L3-4 on the right which is causing significant nerve root and thecal sac compression.    We talked about treatment options.  I have recommended that he undergo a laminectomy and resection of synovial cyst as he has severe persistent right leg and back pain.  He wishes to go ahead with surgery.  We are going to make sure that it is okay from the standpoint of his cardiologist to proceed.  Assuming it is, he needs to come off of his Plavix for 10 days prior to surgery.  Risks and benefits were discussed in detail with the patient.  He wishes to proceed.  We went over MRI and surgical risks with the patient.          Danae Orleans. Venetia Maxon, M.D./sv  cc: Dr. Fuller Canada    NEUROSURGICAL CONSULTATION   [name]   DOB:  1933-05-27 #[id number]     February 13, 2011   HISTORY:     [name] is a 78 year old retired man who is disabled in 1988, who presents with a chief complaint of low back pain, right hip, and right ankle pain at the request of Dr. Fuller Canada.  He describes numbness in his right leg.  He says this began on 08/18/2010.  He says he has had back pain since wearing an ankle brace on 07/2010.  He says this all began after he stepped in a hole and twisted his right ankle and fell on his right side.  [place] dock [place] railroad [place] 10/1985 and required surgery in 1987.  He has engaged in physical therapy, but says it helped him only at first. He currently takes Gabapentin 300 mg. three times daily up to four times daily, which he does say helps him some.  He has been taking Hydrocodone 10/500 four times daily.    He currently is complaining of pain into his right hip, into his ankle, but says he has a new pain after falling in the hole.  He did six weeks of physical therapy without deal of relief.    REVIEW OF  SYSTEMS:   A detailed Review of Systems sheet was reviewed with the patient.  Pertinent positives include wears glasses, hearing aid, balance disturbance, leg pain while walking, shortness of breath, bronchitis, change in bowel habits, back pain, arm pain, leg pain, diabetes, and thyroid disease. All other systems are negative; this includes Constitutional symptoms, Genitourinary, Integumentary & Breast, Neurologic, Psychiatric, Hematologic/Lymphatic, Allergic/Immunologic.    PAST MEDICAL HISTORY:      Current Medical Conditions:    He has a history of hypertension, myocardial infarction, insulin dependent diabetes, COPD, thyroid disease.     Prior Operations and Hospitalizations:   He is status post cholecystectomy, finger surgery, back surgery by Dr. Darrelyn Hillock in 1987, heart surgery by Dr. Andrey Campanile in 04/1986 with stents and CABG.      Medications and Allergies:  Bayer Aspirin 81 mg. qd, Gabapentin 300 mg. t.i.d., Vitamin D3 1000 I.U. b.i.d., Hydrocodone 10/500 prn, Omeprazole 20 mg. qd, Spiriva qd, Acarbose  tablet t.i.d., Allopurinol 300 mg.  tablet qd, Plavix 75 mg. qd, Metoprolol 25 mg. b.i.d., Simvastatin 40 mg. qd, Furosemide 40 mg.  tablet for four days and one tablet for three days, Glipizide 10 mg. b.i.d., Lantus 50  units qd, Januvia 100 mg.  half tablet b.i.d., Byetta 10 units b.i.d., WelChol 625 mg. t.i.d., and Levothyroxine qd.  ALLERGIES ARE TO DEMEROL.      Height and Weight:     He is 5'11 " tall, 254 lbs.  BMI is 34.9.    SOCIAL HISTORY:    He denies tobacco, alcohol, or drug use.    DIAGNOSTIC STUDIES:   He has not had an MRI of is lumbar spine since 2010, which is before he had this more recent injury.    He has had plain radiographs of his lumbar spine which shows L3-4 retrolisthesis and spondylosis with spurring at L1-2 and degenerative disc disease L1 through S1 levels.  There appears to be stenosis on the right at the L4-5 level based on his previous MRI scan.    PHYSICAL  EXAMINATION:      General Appearance:   On examination today, [name] is a pleasant and cooperative man in no acute distress.      Blood Pressure, Pulse:     His blood pressure is 118/76.  Heart rate is 80 and regular.  Respiratory rate is 18.      HEENT - normocephalic, atraumatic.  The pupils are equal, round and reactive to light.  The extraocular muscles are intact.  Sclerae - white.  Conjunctiva - pink.  Oropharynx benign.  Uvula midline.     Neck - there are no masses, meningismus, deformities, tracheal deviation, jugular vein distention or carotid bruits.  There is normal cervical range of motion.  Spurlings' test is negative without reproducible radicular pain turning the patient's head to either side.  Lhermitte's sign is not present with axial compression.      Respiratory - there is normal respiratory effort with good intercostal function.  Lungs are clear to auscultation.  There are no rales, rhonchi or wheezes.      Cardiovascular - the heart has regular rate and rhythm to auscultation.  No murmurs are appreciated.  There is no extremity edema, cyanosis or clubbing.  There are palpable pedal pulses.      Abdomen - obese, soft, nontender, no hepatosplenomegaly appreciated or masses.  There are active bowel sounds.  No guarding or rebound.      Musculoskeletal Examination -  he has right sciatic notch discomfort and has pain into the right lateral aspect of his buttock.  He is only able to bend to within 24" of the floor with his upper extremities outstretched. He has a straight leg raise which is positive at 30 degrees on the right.    NEUROLOGICAL EXAMINATION: The patient is oriented to time, person and place and has good recall of both recent and remote memory with normal attention span and concentration.  The patient speaks with clear and fluent speech and exhibits normal language function and appropriate fund of knowledge.      Cranial Nerve Examination - pupils are equal,  round and reactive to light.  Extraocular movements are full.  Visual fields are full to confrontational testing.  Facial sensation and facial movement are symmetric and intact.  Hearing is intact to finger rub.  Palate is upgoing.  Shoulder shrug is symmetric.  Tongue protrudes in the midline.      Motor Examination - motor strength is 5/5 in the bilateral deltoids, biceps, triceps, handgrips, wrist extensors, interosseous.  In the lower extremities motor strength is 5/5 in hip flexion, extension, quadriceps, hamstrings, plantar flexion, extensor hallucis longus, 4/5 right hip abductor,  5/5 left dorsiflexion, 3/5 right dorsiflexion and great toe strength.      Sensory Examination - he has decreased pin sensation in a right L5 distribution.       Deep Tendon Reflexes - 1 in the biceps, triceps, and brachioradialis, 1in the knees, 1 in the ankles.  The great toes are downgoing to plantar stimulation.      Cerebellar Examination - normal coordination in upper and lower extremities and normal rapid alternating movements.  Romberg test is negative.    IMPRESSION AND RECOMMENDATIONS: [name] is a 78 year old disabled man with multiple medical comorbidities, who is morbidly obese, and has significant right leg weakness in an L5 distribution. He has had previous low back surgery.  He injured himself while stepping in a hole and twisting his right ankle.  At this point I have recommended we get new imaging studies to better clarify if there is any significant nerve root compression or structural   basis for his significant weakness. I will make further recommendations after those studies have been done.    [facility]    Danae Orleans. Venetia Maxon, M.D.  JDS:aft cc: Dr. Fuller Canada

## 2011-04-11 NOTE — Plan of Care (Signed)
Problem: Consults Goal: Diagnosis - Spinal Surgery Outcome: Completed/Met Date Met:  04/11/11 Lumbar Laminectomy (Complex)     

## 2011-04-11 NOTE — Interval H&P Note (Signed)
History and Physical Interval Note:  04/11/2011 7:28 AM  [name]  has presented today for surgery, with the diagnosis of synovial cyst lumbar stenosis  The various methods of treatment have been discussed with the patient and family. After consideration of risks, benefits and other options for treatment, the patient has consented to  Procedure(s) (LRB): LUMBAR LAMINECTOMY/DECOMPRESSION MICRODISCECTOMY 1 LEVEL (Right) as a surgical intervention .  The patients' history has been reviewed, patient examined, no change in status, stable for surgery.  I have reviewed the patients' chart and labs.  Questions were answered to the patient's satisfaction.     [name] D  Date of Initial H&P: 03/29/2011  History reviewed, patient examined, no change in status, stable for surgery.

## 2011-04-11 NOTE — Progress Notes (Signed)
Per report, pt had only voided once (about ) since arrived on this unit a little over 6 hrs ago. Pt encouraged to void and he voided in the urinal. Bladder scan done for post void residual and the result was . Pt was educated about the next line of action, i.e., in and out cath, but he refused.  He said he's not hurting, he doesn't void too much at home, he usually have this kind of problem after surgery, and that he will be okay.  Pt is alert and oriented x4 and was advised to call if he start to feel uncomfortable.

## 2011-04-11 NOTE — Anesthesia Preprocedure Evaluation (Signed)
Anesthesia Evaluation  Patient identified by MRN, date of birth, ID band Patient awake    Reviewed: Allergy & Precautions, H&P , NPO status , Patient's Chart, lab work & pertinent test results  Airway Mallampati: II  Neck ROM: Full    Dental  (+) Edentulous Upper and Edentulous Lower   Pulmonary shortness of breath, sleep apnea , pneumonia , COPD   + decreased breath sounds      Cardiovascular hypertension, Pt. on medications + angina with exertion + CAD, + Past MI and +CHF Regular Normal    Neuro/Psych    GI/Hepatic negative GI ROS, GERD-  ,  Endo/Other  Diabetes mellitus-Hypothyroidism Morbid obesity  Renal/GU      Musculoskeletal   Abdominal (+) obese,   Peds  Hematology   Anesthesia Other Findings   Reproductive/Obstetrics                           Anesthesia Physical Anesthesia Plan  ASA: III  Anesthesia Plan: General   Post-op Pain Management:    Induction: Intravenous  Airway Management Planned: Oral ETT  Additional Equipment:   Intra-op Plan:   Post-operative Plan: Extubation in OR  Informed Consent: I have reviewed the patients History and Physical, chart, labs and discussed the procedure including the risks, benefits and alternatives for the proposed anesthesia with the patient or authorized representative who has indicated his/her understanding and acceptance.     Plan Discussed with: CRNA and Surgeon  Anesthesia Plan Comments:         Anesthesia Quick Evaluation

## 2011-04-11 NOTE — Transfer of Care (Signed)
Immediate Anesthesia Transfer of Care Note  Patient: [name]  Procedure(s) Performed: Procedure(s) (LRB): LUMBAR LAMINECTOMY/DECOMPRESSION MICRODISCECTOMY 1 LEVEL (Right)  Patient Location: PACU  Anesthesia Type: General  Level of Consciousness: awake, alert  and oriented  Airway & Oxygen Therapy: Patient Spontanous Breathing  Post-op Assessment: Report given to PACU RN and Post -op Vital signs reviewed and stable  Post vital signs: Reviewed and stable  Complications: No apparent anesthesia complications

## 2011-04-12 ENCOUNTER — Encounter (HOSPITAL_COMMUNITY): Payer: Self-pay | Admitting: Neurosurgery

## 2011-04-12 LAB — GLUCOSE, CAPILLARY: Glucose-Capillary: 188 mg/dL — ABNORMAL HIGH (ref 70–99)

## 2011-04-12 NOTE — Progress Notes (Signed)
Pt placed on FFM for Cpap@hs . Settings: 10cmh2o as per pt request of home settings.

## 2011-04-12 NOTE — Progress Notes (Signed)
Subjective: Patient reports doing great!  Objective: Vital signs in last 24 hours: Temp:  [97.3 F (36.3 C)-97.9 F (36.6 C)] 97.5 F (36.4 C) (02/13 0400) Pulse Rate:  [55-78] 62  (02/13 0400) Resp:  [18-24] 18  (02/13 0400) BP: (134-165)/(42-72) 154/68 mmHg (02/13 0400) SpO2:  [96 %-100 %] 96 % (02/13 0400)  Intake/Output from previous day: 02/12 0701 - 02/13 0700 In: 1970 [P.O.:720; I.V.:1250] Out: 1525 [Urine:1525] Intake/Output this shift:    Physical Exam: Full strength, incision CDI  Lab Results: No results found for this basename: WBC:2,HGB:2,HCT:2,PLT:2 in the last 72 hours BMET No results found for this basename: NA:2,K:2,CL:2,CO2:2,GLUCOSE:2,BUN:2,CREATININE:2,CALCIUM:2 in the last 72 hours  Studies/Results: Dg Lumbar Spine 1 View  04/11/2011  *RADIOLOGY REPORT*  Clinical Data: Intraoperative localization for lumbar surgery.  LUMBAR SPINE - 1 VIEW  Comparison: MRI lumbar spine 02/22/2011.  Findings: Lateral lumbar spine film labeled #1 demonstrates a surgical instrument marking the L3-4 disc space level.  IMPRESSION: L3-4 marked intraoperatively.  Original Report Authenticated By: P. Loralie Champagne, M.D.    Assessment/Plan: Doing well.  DC home    LOS: 1 day    [name], MD 04/12/2011, 8:53 AM

## 2011-04-12 NOTE — Discharge Instructions (Signed)

## 2011-04-12 NOTE — Discharge Summary (Signed)
Physician Discharge Summary  Patient ID: JAEDEN MESSER MRN: [id number] DOB/AGE: 01-Nov-1933 78 y.o.  Admit date: 04/11/2011 Discharge date: 04/12/2011  Admission Diagnoses:Synovial cyst Right L34, Diabetes, CAD  Discharge Diagnoses: Synovial cyst Right L34, Diabetes, CAD Active Problems:  * No active hospital problems. *    Discharged Condition: good  Hospital Course: Uncomplicated Lumbar laminectomy and resection of synovial cyst L34 Right  Consults: None  Significant Diagnostic Studies: None  Treatments: surgery: Uncomplicated Lumbar laminectomy and resection of synovial cyst L34 Right  Discharge Exam: Blood pressure 154/68, pulse 62, temperature 97.5 F (36.4 C), temperature source Oral, resp. rate 18, SpO2 96.00%. Neurologic: Alert and oriented X 3, normal strength and tone. Normal symmetric reflexes. Normal coordination and gait Incision/Wound:CDI  Disposition: Home or Self Care   Medication List  As of 04/12/2011  8:55 AM   TAKE these medications         allopurinol 300 MG tablet   Commonly known as: ZYLOPRIM   Take 150 mg by mouth at bedtime.      aspirin 81 MG tablet   Take 81 mg by mouth daily.      BYETTA 10 MCG PEN [place]   Inject 10 Units into the skin 2 (two) times daily.      calcium-vitamin D 500-200 MG-UNIT per tablet   Commonly known as: OSCAL WITH D   Take 1 tablet by mouth 2 (two) times daily.      clopidogrel 75 MG tablet   Commonly known as: PLAVIX   Take 75 mg by mouth daily. Stopped on 1-30      colesevelam 625 MG tablet   Commonly known as: WELCHOL   Take 1,875 mg by mouth daily.      furosemide 40 MG tablet   Commonly known as: LASIX   Take 20-40 mg by mouth daily. 0.5 tablet for 4 days  1 tablet for 3 days      gabapentin 300 MG capsule   Commonly known as: NEURONTIN   Take 300 mg by mouth 3 (three) times daily.      glipiZIDE 10 MG tablet   Commonly known as: GLUCOTROL   Take 10 mg by mouth 2 (two) times daily.     HYDROcodone-acetaminophen 10-500 MG per tablet   Commonly known as: LORTAB   Take 1 tablet by mouth every 4 (four) hours as needed. For pain      insulin glargine 100 UNIT/ML injection   Commonly known as: LANTUS   Inject 50 Units into the skin daily.      levothyroxine 150 MCG tablet   Commonly known as: SYNTHROID, LEVOTHROID   Take 150 mcg by mouth daily.      metoprolol tartrate 25 MG tablet   Commonly known as: LOPRESSOR   Take 25 mg by mouth 2 (two) times daily.      omeprazole 20 MG capsule   Commonly known as: PRILOSEC   Take 20 mg by mouth at bedtime.      simvastatin 80 MG tablet   Commonly known as: ZOCOR   Take 40 mg by mouth at bedtime.      [name] 100 MG tablet   Commonly known as: JANUVIA   Take 50 mg by mouth 2 (two) times daily.      SPIRIVA HANDIHALER IN   Inhale 1-2 capsules into the lungs daily. Twice a day if needed for breathing      Vitamin D3 1000 UNITS Caps   Take 1 capsule by  mouth 2 (two) times daily.             Signed: Mulki Roesler D 04/12/2011, 8:55 AM

## 2011-04-28 ENCOUNTER — Emergency Department (HOSPITAL_COMMUNITY)
Admission: EM | Admit: 2011-04-28 | Discharge: 2011-04-28 | Disposition: A | Discharge status: Home / Self Care | Payer: Medicare Other | Attending: Emergency Medicine | Admitting: Emergency Medicine

## 2011-04-28 ENCOUNTER — Encounter (HOSPITAL_COMMUNITY): Payer: Self-pay | Admitting: *Deleted

## 2011-04-28 DIAGNOSIS — J4489 Other specified chronic obstructive pulmonary disease: Secondary | ICD-10-CM | POA: Insufficient documentation

## 2011-04-28 DIAGNOSIS — N390 Urinary tract infection, site not specified: Secondary | ICD-10-CM

## 2011-04-28 DIAGNOSIS — E119 Type 2 diabetes mellitus without complications: Secondary | ICD-10-CM | POA: Insufficient documentation

## 2011-04-28 DIAGNOSIS — Z79899 Other long term (current) drug therapy: Secondary | ICD-10-CM | POA: Insufficient documentation

## 2011-04-28 DIAGNOSIS — R3911 Hesitancy of micturition: Secondary | ICD-10-CM | POA: Insufficient documentation

## 2011-04-28 DIAGNOSIS — E785 Hyperlipidemia, unspecified: Secondary | ICD-10-CM | POA: Insufficient documentation

## 2011-04-28 DIAGNOSIS — J449 Chronic obstructive pulmonary disease, unspecified: Secondary | ICD-10-CM | POA: Insufficient documentation

## 2011-04-28 DIAGNOSIS — Z7982 Long term (current) use of aspirin: Secondary | ICD-10-CM | POA: Insufficient documentation

## 2011-04-28 LAB — URINALYSIS, ROUTINE W REFLEX MICROSCOPIC
Bilirubin Urine: NEGATIVE
Ketones, ur: NEGATIVE mg/dL
Nitrite: POSITIVE — AB
Specific Gravity, Urine: >1.03 — ABNORMAL HIGH (ref 1.005–1.030)
pH: 5 (ref 5.0–8.0)

## 2011-04-28 LAB — URINE MICROSCOPIC-ADD ON

## 2011-04-28 MED ORDER — CEPHALEXIN 500 MG PO CAPS: 500.0000 mg | ORAL_CAPSULE | Freq: Four times a day (QID) | ORAL | Status: AC

## 2011-04-28 MED ORDER — CEPHALEXIN 500 MG PO CAPS
500.0000 mg | ORAL_CAPSULE | Freq: Once | ORAL | Status: AC
  Administered 2011-04-28: 500 mg via ORAL
  Filled 2011-04-28: qty 1

## 2011-04-28 NOTE — Discharge Instructions (Signed)

## 2011-04-28 NOTE — ED Notes (Signed)
Pt reports having some dyuria and increased urinary frequency on last night. Pt reports that he is able to void but states that he feels as though he is unable to empty his bladder completely. Pt wife denies any fever or other signs of infection at this time.

## 2011-04-28 NOTE — ED Notes (Signed)
Dysuria, voiding small amts, at a time.

## 2011-04-28 NOTE — ED Notes (Signed)
MD at bedside. 

## 2011-04-28 NOTE — ED Notes (Signed)
Pt alert & oriented x4, stable gait. Pt given discharge instructions, paperwork & prescription(s). Patient instructed to stop at the registration desk to finish any additional paperwork. pt verbalized understanding. Pt left department w/ no further questions.  

## 2011-04-28 NOTE — ED Provider Notes (Signed)
This chart was scribed for Joya Gaskins, MD by Wallis Mart. The patient was seen in room APA03/APA03 and the patient's care was started at 10:06 PM.   CSN: [id number]  Arrival date & time 04/28/11  2123   First MD Initiated Contact with Patient 04/28/11 2152      Chief Complaint  Patient presents with  . Urinary Retention     Patient is a 78 y.o. male presenting with dysuria.  Dysuria  This is a new problem. The current episode started 12 to 24 hours ago. The problem occurs every urination. The problem has been gradually worsening. The quality of the pain is described as burning. The pain is mild. There has been no fever. Associated symptoms include hesitancy and urgency. Pertinent negatives include no nausea, no vomiting and no flank pain. He has tried nothing for the symptoms. His past medical history is significant for kidney stones.   BARRY FAIRCLOTH is a 78 y.o. male who presents to the Emergency Department complaining of gradual onset, persistence of constant, urinary retention onset last night.  Pt has tried to urinate every 30 minutes to an hour and voiding very small amounts of urine. Pt is having regular BMs.   Pt c/o associated dysuria. Denies fevers, vomiting, pain.  There are no other associated symptoms and no other alleviating or aggravating factors.  Pt did have "back surgery" about 3 weeks ago, but if feeling improved, no new weakness in his legs and is ambulatory    Past Medical History  Diagnosis Date  . Diabetes mellitus type II   . Sleep apnea     sleep study done several years ago  . Hypothyroidism   . Hypertension   . Pneumonia   . GERD (gastroesophageal reflux disease)   . Arthritis   . Angina   . Shortness of breath     with exertion  . CHF (congestive heart failure)   . Heart attack   . COPD (chronic obstructive pulmonary disease)   . Coronary artery disease     Past Surgical History  Procedure Date  . Gallbladder surgery   . Back  surgery   . Heart stents   . Heart bypass   . Finger surgery right little  . Cholecystectomy   . Coronary artery bypass graft   . Tonsillectomy   . Cardiac catheterization     does not remember last time  . Knee arthroscopy     left  . Lumbar laminectomy/decompression microdiscectomy 04/11/2011    Procedure: LUMBAR LAMINECTOMY/DECOMPRESSION MICRODISCECTOMY 1 LEVEL;  Surgeon: [name], MD;  Location: [facility];  Service: Neurosurgery;  Laterality: Right;  RIGHT Lumbar Three-Four laminectomy with resection of synovial cyst    Family History  Problem Relation Age of Onset  . Arthritis    . Cancer    . Diabetes      History  Substance Use Topics  . Smoking status: Former Games developer  . Smokeless tobacco: Not on file  . Alcohol Use: No      Review of Systems  Gastrointestinal: Negative for nausea and vomiting.  Genitourinary: Positive for dysuria, hesitancy and urgency. Negative for flank pain.   10 Systems reviewed and are negative for acute change except as noted in the HPI.   Allergies  Demerol  Home Medications   Current Outpatient Rx  Name Route Sig Dispense Refill  . ALLOPURINOL 300 MG PO TABS Oral Take 150 mg by mouth at bedtime.     Marland Kitchen  ASPIRIN 81 MG PO TABS Oral Take 81 mg by mouth daily.      Marland Kitchen CALCIUM CARBONATE-VITAMIN D 500-200 MG-UNIT PO TABS Oral Take 1 tablet by mouth 2 (two) times daily.     Marland Kitchen VITAMIN D3 1000 UNITS PO CAPS Oral Take 1 capsule by mouth 2 (two) times daily.     Marland Kitchen CLOPIDOGREL BISULFATE 75 MG PO TABS Oral Take 75 mg by mouth daily. Stopped on 1-30    . COLESEVELAM HCL 625 MG PO TABS Oral Take 1,875 mg by mouth daily.     Marland Kitchen BYETTA 10 MCG PEN [place] Subcutaneous Inject 10 Units into the skin 2 (two) times daily.     . FUROSEMIDE 40 MG PO TABS Oral Take 20-40 mg by mouth daily. 0.5 tablet for 4 days 1 tablet for 3 days     . GABAPENTIN 300 MG PO CAPS Oral Take 300 mg by mouth 3 (three) times daily.     Marland Kitchen GLIPIZIDE 10 MG PO TABS Oral Take 10 mg  by mouth 2 (two) times daily.     Marland Kitchen HYDROCODONE-ACETAMINOPHEN 10-500 MG PO TABS Oral Take 1 tablet by mouth every 4 (four) hours as needed. For pain    . INSULIN GLARGINE 100 UNIT/ML [place] SOLN Subcutaneous Inject 50 Units into the skin daily.     Marland Kitchen LEVOTHYROXINE SODIUM 150 MCG PO TABS Oral Take 150 mcg by mouth daily.      Marland Kitchen METOPROLOL TARTRATE 25 MG PO TABS Oral Take 25 mg by mouth 2 (two) times daily.      Marland Kitchen OMEPRAZOLE 20 MG PO CPDR Oral Take 20 mg by mouth at bedtime.     Marland Kitchen SIMVASTATIN 80 MG PO TABS Oral Take 40 mg by mouth at bedtime.      Marland Kitchen [name] PHOSPHATE 100 MG PO TABS Oral Take 50 mg by mouth 2 (two) times daily.     Marland Kitchen SPIRIVA HANDIHALER IN Inhalation Inhale 1-2 capsules into the lungs daily. Twice a day if needed for breathing      BP 125/41  Pulse 84  Temp(Src) 97.9 F (36.6 C) (Oral)  Resp 18  Ht 5' 11.5" (1.816 m)  Wt 260 lb (117.935 kg)  BMI 35.76 kg/m2  SpO2 96%  Physical Exam CONSTITUTIONAL: Well developed/well nourished HEAD AND FACE: Normocephalic/atraumatic EYES: EOMI/PERRL ENMT: Mucous membranes moist NECK: supple no meningeal signs SPINE:entire spine nontender, well healing incision to low back, no tenderness/drainage CV:  no murmurs/rubs/gallops noted LUNGS: Lungs are clear to auscultation bilaterally, no apparent distress ABDOMEN: soft, nontender, no rebound or guarding.  No suprapubic tenderness GU:no cva tenderness Rectal - prostate nontender. No rectal mass/tenderness noted.  Stool color normal.  No saddle anesthesia.  Chaperone present NEURO: Pt is awake/alert, moves all extremitiesx4.   equal distal motor: hip flexion/knee flexion/extension, ankle dorsi/plantar flexion EXTREMITIES: pulses normal, full ROM SKIN: warm, color normal PSYCH: no abnormalities of mood noted  ED Course  Procedures  DIAGNOSTIC STUDIES: Oxygen Saturation is 96% on room air, normal by my interpretation.    COORDINATION OF CARE:  Pt well appearing, no neuro deficits,  no fecal incontinence.  He is not having urinary retention, just frequency (I placed US probe to his abdomen after he urinated and he had no urine in bladder) Doubt cauda equina Prostate nontender Stable for d/c  The patient appears reasonably screened and/or stabilized for discharge and I doubt any other medical condition or other [facility] requiring further screening, evaluation, or treatment in the ED at  this time prior to discharge.    Labs Reviewed  URINALYSIS, ROUTINE W REFLEX MICROSCOPIC   Results for orders placed during the hospital encounter of 04/28/11  URINALYSIS, ROUTINE W REFLEX MICROSCOPIC      Component Value Range   Color, Urine YELLOW  YELLOW    APPearance HAZY (*) CLEAR    Specific Gravity, Urine >1.030 (*) 1.005 - 1.030    pH 5.0  5.0 - 8.0    Glucose, UA NEGATIVE  NEGATIVE (mg/dL)   Hgb urine dipstick SMALL (*) NEGATIVE    Bilirubin Urine NEGATIVE  NEGATIVE    Ketones, ur NEGATIVE  NEGATIVE (mg/dL)   Protein, ur 30 (*) NEGATIVE (mg/dL)   Urobilinogen, UA 0.2  0.0 - 1.0 (mg/dL)   Nitrite POSITIVE (*) NEGATIVE    Leukocytes, UA MODERATE (*) NEGATIVE   URINE MICROSCOPIC-ADD ON      Component Value Range   Squamous Epithelial / LPF RARE  RARE    WBC, UA TOO NUMEROUS TO COUNT  <3 (WBC/hpf)   RBC / HPF 11-20  <3 (RBC/hpf)   Bacteria, UA MANY (*) RARE         MDM  Nursing notes reviewed and considered in documentation All labs/vitals reviewed and considered    I personally performed the services described in this documentation, which was scribed in my presence. The recorded information has been reviewed and considered.          Joya Gaskins, MD 04/28/11 2252

## 2011-05-29 DIAGNOSIS — J189 Pneumonia, unspecified organism: Secondary | ICD-10-CM

## 2011-05-29 HISTORY — DX: Pneumonia, unspecified organism: J18.9

## 2011-05-31 ENCOUNTER — Emergency Department (HOSPITAL_COMMUNITY): Payer: Medicare Other

## 2011-05-31 ENCOUNTER — Inpatient Hospital Stay (HOSPITAL_COMMUNITY)
Admission: EM | Admit: 2011-05-31 | Discharge: 2011-06-04 | DRG: 292 | Disposition: A | Discharge status: Home / Self Care | Payer: Medicare Other | Attending: Internal Medicine | Admitting: Internal Medicine

## 2011-05-31 ENCOUNTER — Other Ambulatory Visit: Payer: Self-pay

## 2011-05-31 ENCOUNTER — Encounter (HOSPITAL_COMMUNITY): Payer: Self-pay | Admitting: Emergency Medicine

## 2011-05-31 DIAGNOSIS — F3289 Other specified depressive episodes: Secondary | ICD-10-CM | POA: Diagnosis present

## 2011-05-31 DIAGNOSIS — I5033 Acute on chronic diastolic (congestive) heart failure: Principal | ICD-10-CM | POA: Diagnosis present

## 2011-05-31 DIAGNOSIS — M549 Dorsalgia, unspecified: Secondary | ICD-10-CM

## 2011-05-31 DIAGNOSIS — J449 Chronic obstructive pulmonary disease, unspecified: Secondary | ICD-10-CM | POA: Diagnosis present

## 2011-05-31 DIAGNOSIS — N390 Urinary tract infection, site not specified: Secondary | ICD-10-CM | POA: Diagnosis present

## 2011-05-31 DIAGNOSIS — E871 Hypo-osmolality and hyponatremia: Secondary | ICD-10-CM | POA: Diagnosis present

## 2011-05-31 DIAGNOSIS — E669 Obesity, unspecified: Secondary | ICD-10-CM | POA: Diagnosis present

## 2011-05-31 DIAGNOSIS — E119 Type 2 diabetes mellitus without complications: Secondary | ICD-10-CM | POA: Diagnosis present

## 2011-05-31 DIAGNOSIS — J4489 Other specified chronic obstructive pulmonary disease: Secondary | ICD-10-CM | POA: Diagnosis present

## 2011-05-31 DIAGNOSIS — R0600 Dyspnea, unspecified: Secondary | ICD-10-CM

## 2011-05-31 DIAGNOSIS — I2510 Atherosclerotic heart disease of native coronary artery without angina pectoris: Secondary | ICD-10-CM | POA: Diagnosis present

## 2011-05-31 DIAGNOSIS — I5032 Chronic diastolic (congestive) heart failure: Secondary | ICD-10-CM | POA: Diagnosis present

## 2011-05-31 DIAGNOSIS — R509 Fever, unspecified: Secondary | ICD-10-CM

## 2011-05-31 DIAGNOSIS — F329 Major depressive disorder, single episode, unspecified: Secondary | ICD-10-CM | POA: Diagnosis present

## 2011-05-31 DIAGNOSIS — I509 Heart failure, unspecified: Secondary | ICD-10-CM | POA: Diagnosis present

## 2011-05-31 DIAGNOSIS — I10 Essential (primary) hypertension: Secondary | ICD-10-CM | POA: Diagnosis present

## 2011-05-31 DIAGNOSIS — E039 Hypothyroidism, unspecified: Secondary | ICD-10-CM | POA: Diagnosis present

## 2011-05-31 DIAGNOSIS — Z951 Presence of aortocoronary bypass graft: Secondary | ICD-10-CM

## 2011-05-31 LAB — CBC
Hemoglobin: 12 g/dL — ABNORMAL LOW (ref 13.0–17.0)
MCH: 31.8 pg (ref 26.0–34.0)
MCV: 99.2 fL (ref 78.0–100.0)
RBC: 3.77 MIL/uL — ABNORMAL LOW (ref 4.22–5.81)

## 2011-05-31 LAB — COMPREHENSIVE METABOLIC PANEL
Alkaline Phosphatase: 94 U/L (ref 39–117)
BUN: 12 mg/dL (ref 6–23)
CO2: 27 mEq/L (ref 19–32)
GFR calc Af Amer: 56 mL/min — ABNORMAL LOW (ref >90)
GFR calc non Af Amer: 48 mL/min — ABNORMAL LOW (ref >90)
Glucose, Bld: 80 mg/dL (ref 70–99)
Potassium: 4 mEq/L (ref 3.5–5.1)
Total Protein: 7.3 g/dL (ref 6.0–8.3)

## 2011-05-31 LAB — URINALYSIS, ROUTINE W REFLEX MICROSCOPIC
Bilirubin Urine: NEGATIVE
Hgb urine dipstick: NEGATIVE
Ketones, ur: NEGATIVE mg/dL
Protein, ur: 30 mg/dL — AB
Urobilinogen, UA: 0.2 mg/dL (ref 0.0–1.0)

## 2011-05-31 LAB — LACTIC ACID, PLASMA: Lactic Acid, Venous: 1.4 mmol/L (ref 0.5–2.2)

## 2011-05-31 LAB — URINE MICROSCOPIC-ADD ON

## 2011-05-31 LAB — GLUCOSE, CAPILLARY: Glucose-Capillary: 75 mg/dL (ref 70–99)

## 2011-05-31 LAB — DIFFERENTIAL
Eosinophils Absolute: 0.3 10*3/uL (ref 0.0–0.7)
Eosinophils Relative: 2 % (ref 0–5)
Lymphs Abs: 0.6 10*3/uL — ABNORMAL LOW (ref 0.7–4.0)
Monocytes Relative: 6 % (ref 3–12)
Neutrophils Relative %: 89 % — ABNORMAL HIGH (ref 43–77)

## 2011-05-31 MED ORDER — MOXIFLOXACIN HCL IN NACL 400 MG/250ML IV SOLN
400.0000 mg | Freq: Once | INTRAVENOUS | Status: AC
  Administered 2011-06-01: 400 mg via INTRAVENOUS
  Filled 2011-05-31: qty 250

## 2011-05-31 MED ORDER — SODIUM CHLORIDE 0.9 % IV SOLN
1000.0000 mL | INTRAVENOUS | Status: DC
  Administered 2011-05-31: 1000 mL via INTRAVENOUS

## 2011-05-31 MED ORDER — SODIUM CHLORIDE 0.9 % IV SOLN
1000.0000 mL | Freq: Once | INTRAVENOUS | Status: AC
  Administered 2011-05-31: 1000 mL via INTRAVENOUS

## 2011-05-31 NOTE — ED Notes (Signed)
Brought in by EMS with c/o some difficulty of breathing and confusion "all day long".

## 2011-05-31 NOTE — ED Provider Notes (Addendum)
History     CSN: [id number]  Arrival date & time 05/31/11  2015   First MD Initiated Contact with Patient 05/31/11 2029      Chief Complaint  Patient presents with  . Shortness of Breath  . Altered Mental Status    HPI Pt presents to the ED complaining of weakness.  Pt had an episode of vomiting today.  He denies any cough or dysuria but he states he urinates all the time.  He denies stomach ache, chest pain or headache.  He has been feeling feverish.  He has been feeling short of breath however.  Pt has history of back surgery a couple months ago but that seems to be healing well.  The symptoms started today.  He has had problems with UTIs in the past.   Past Medical History  Diagnosis Date  . Diabetes mellitus type II   . Sleep apnea     sleep study done several years ago  . Hypothyroidism   . Hypertension   . Pneumonia   . GERD (gastroesophageal reflux disease)   . Arthritis   . Angina   . Shortness of breath     with exertion  . CHF (congestive heart failure)   . Heart attack   . COPD (chronic obstructive pulmonary disease)   . Coronary artery disease     Past Surgical History  Procedure Date  . Gallbladder surgery   . Back surgery   . Heart stents   . Heart bypass   . Finger surgery right little  . Cholecystectomy   . Coronary artery bypass graft   . Tonsillectomy   . Cardiac catheterization     does not remember last time  . Knee arthroscopy     left  . Lumbar laminectomy/decompression microdiscectomy 04/11/2011    Procedure: LUMBAR LAMINECTOMY/DECOMPRESSION MICRODISCECTOMY 1 LEVEL;  Surgeon: [name], MD;  Location: [facility];  Service: Neurosurgery;  Laterality: Right;  RIGHT Lumbar Three-Four laminectomy with resection of synovial cyst    Family History  Problem Relation Age of Onset  . Arthritis    . Cancer    . Diabetes      History  Substance Use Topics  . Smoking status: Former Games developer  . Smokeless tobacco: Not on file  . Alcohol  Use: No      Review of Systems  All other systems reviewed and are negative.    Allergies  Demerol  Home Medications   Current Outpatient Rx  Name Route Sig Dispense Refill  . ALLOPURINOL 300 MG PO TABS Oral Take 150 mg by mouth at bedtime.     . ASPIRIN EC 81 MG PO TBEC Oral Take 81 mg by mouth at bedtime.    Marland Kitchen VITAMIN D3 1000 UNITS PO CAPS Oral Take 1 capsule by mouth 2 (two) times daily.     Marland Kitchen CLOPIDOGREL BISULFATE 75 MG PO TABS Oral Take 75 mg by mouth at bedtime. Stopped on 1-30    . COLESEVELAM HCL 625 MG PO TABS Oral Take 1,875 mg by mouth at bedtime.     Marland Kitchen BYETTA 10 MCG PEN [place] Subcutaneous Inject 10 Units into the skin 2 (two) times daily.     . FUROSEMIDE 40 MG PO TABS Oral Take 20-40 mg by mouth daily. Take one-half  tablet for 4 days and take 1 tablet for 3 days    . GABAPENTIN 300 MG PO CAPS Oral Take 300 mg by mouth 3 (three) times daily.     Marland Kitchen  GLIPIZIDE 10 MG PO TABS Oral Take 10 mg by mouth 2 (two) times daily.     Marland Kitchen HYDROCODONE-ACETAMINOPHEN 10-500 MG PO TABS Oral Take 1 tablet by mouth every 4 (four) hours as needed. For pain    . INSULIN GLARGINE 100 UNIT/ML [place] SOLN Subcutaneous Inject 50 Units into the skin at bedtime.     Marland Kitchen LEVOTHYROXINE SODIUM 150 MCG PO TABS Oral Take 150 mcg by mouth at bedtime.     Marland Kitchen METOPROLOL TARTRATE 25 MG PO TABS Oral Take 25 mg by mouth 2 (two) times daily.     Marland Kitchen OMEPRAZOLE 20 MG PO CPDR Oral Take 20 mg by mouth at bedtime.     Marland Kitchen SIMVASTATIN 40 MG PO TABS Oral Take 40 mg by mouth at bedtime.    Marland Kitchen [name] PHOSPHATE 100 MG PO TABS Oral Take 100 mg by mouth 2 (two) times daily.     Marland Kitchen SPIRIVA HANDIHALER IN Inhalation Inhale 1-2 capsules into the lungs daily. Twice a day if needed for breathing      BP 157/88  Pulse 83  Temp(Src) 100.9 F (38.3 C) (Oral)  Resp 33  SpO2 95%  Physical Exam  Nursing note and vitals reviewed. Constitutional: He appears well-developed and well-nourished. No distress.  HENT:  Head: Normocephalic  and atraumatic.  Right Ear: External ear normal.  Left Ear: External ear normal.  Mouth/Throat: No oropharyngeal exudate.       Dried emesis around the mouth  Eyes: Conjunctivae are normal. Right eye exhibits no discharge. Left eye exhibits no discharge. No scleral icterus.  Neck: Neck supple. No tracheal deviation present.  Cardiovascular: Normal rate, regular rhythm and intact distal pulses.   Pulmonary/Chest: Breath sounds normal. No stridor. Tachypnea noted. No respiratory distress. He has no wheezes. He has no rales.  Abdominal: Soft. Bowel sounds are normal. He exhibits no distension, no ascites and no mass. There is no tenderness. There is no rebound and no guarding.  Musculoskeletal: He exhibits no edema and no tenderness.       Right shoulder: He exhibits decreased strength.       General weakness throughout  Neurological: He is alert. He is not disoriented. No sensory deficit. Cranial nerve deficit:  no gross defecits noted. He exhibits normal muscle tone. He displays no seizure activity. Coordination normal.  Skin: Skin is warm and dry. No rash noted.  Psychiatric: He has a normal mood and affect.    ED Course  Procedures (including critical care time)  Date: 05/31/2011  Rate: 79  Rhythm: normal sinus rhythm  QRS Axis: normal  Intervals: normal  ST/T Wave abnormalities: normal  Conduction Disutrbances:none  Narrative Interpretation:   Old EKG Reviewed: no significant changes   Labs Reviewed  CBC - Abnormal; Notable for the following:    WBC 17.2 (*)    RBC 3.77 (*)    Hemoglobin 12.0 (*)    HCT 37.4 (*)    RDW 15.6 (*)    All other components within normal limits  DIFFERENTIAL - Abnormal; Notable for the following:    Neutrophils Relative 89 (*)    Neutro Abs 15.3 (*)    Lymphocytes Relative 3 (*)    Lymphs Abs 0.6 (*)    Monocytes Absolute 1.1 (*)    All other components within normal limits  COMPREHENSIVE METABOLIC PANEL - Abnormal; Notable for the  following:    Sodium 132 (*)    Creatinine, Ser 1.38 (*)    Albumin 3.2 (*)  GFR calc non Af Amer 48 (*)    GFR calc Af Amer 56 (*)    All other components within normal limits  URINALYSIS, ROUTINE W REFLEX MICROSCOPIC - Abnormal; Notable for the following:    Protein, ur 30 (*)    Leukocytes, UA SMALL (*)    All other components within normal limits  GLUCOSE, CAPILLARY  LACTIC ACID, PLASMA  PROCALCITONIN  URINE MICROSCOPIC-ADD ON  CULTURE, BLOOD (ROUTINE X 2)  CULTURE, BLOOD (ROUTINE X 2)  URINE CULTURE  PRO B NATRIURETIC PEPTIDE   Dg Chest Port 1 View  05/31/2011  *RADIOLOGY REPORT*  Clinical Data: Shortness of breath.  PORTABLE CHEST - 1 VIEW  Comparison: 09/01/2010.  Findings: The heart is enlarged but stable.  Stable surgical changes from bypass surgery.  There is vascular congestion and mild interstitial edema suggesting CHF.  No definite pleural effusions. Stable elevation of the right hemidiaphragm.  IMPRESSION: Cardiac enlargement with vascular congestion and mild interstitial edema.  Original Report Authenticated By: P. Loralie Champagne, M.D.     MDM  The patient remains tachypnea. Chest x-ray does not show obvious pneumonia. His urinalysis does suggest UTI. I suspected he could have a subclinical pneumonia considering his the, fever and leukocytosis. It is possible this could be influenza as well. I doubt pulmonary embolism.  I doubt meningitis as patient has no meningismus and denies headache and there is no confusion at this time. Consult the medical service for admission.      Celene Kras, MD 05/31/11 [phone]

## 2011-05-31 NOTE — ED Notes (Signed)
HKV:[id number]<DG> Expected date:05/31/11<BR> Expected time: 8:00 PM<BR> Means of arrival:Ambulance<BR> Comments:<BR> Breathing trouble

## 2011-06-01 ENCOUNTER — Encounter (HOSPITAL_COMMUNITY): Payer: Self-pay | Admitting: Internal Medicine

## 2011-06-01 DIAGNOSIS — E119 Type 2 diabetes mellitus without complications: Secondary | ICD-10-CM | POA: Diagnosis present

## 2011-06-01 DIAGNOSIS — F329 Major depressive disorder, single episode, unspecified: Secondary | ICD-10-CM | POA: Diagnosis present

## 2011-06-01 DIAGNOSIS — E669 Obesity, unspecified: Secondary | ICD-10-CM | POA: Diagnosis present

## 2011-06-01 DIAGNOSIS — J449 Chronic obstructive pulmonary disease, unspecified: Secondary | ICD-10-CM | POA: Diagnosis present

## 2011-06-01 DIAGNOSIS — F32A Depression, unspecified: Secondary | ICD-10-CM | POA: Diagnosis present

## 2011-06-01 DIAGNOSIS — I5032 Chronic diastolic (congestive) heart failure: Secondary | ICD-10-CM | POA: Diagnosis present

## 2011-06-01 DIAGNOSIS — I2510 Atherosclerotic heart disease of native coronary artery without angina pectoris: Secondary | ICD-10-CM

## 2011-06-01 DIAGNOSIS — N390 Urinary tract infection, site not specified: Secondary | ICD-10-CM | POA: Diagnosis present

## 2011-06-01 DIAGNOSIS — I10 Essential (primary) hypertension: Secondary | ICD-10-CM | POA: Diagnosis present

## 2011-06-01 DIAGNOSIS — E039 Hypothyroidism, unspecified: Secondary | ICD-10-CM | POA: Diagnosis present

## 2011-06-01 LAB — CBC
HCT: 35.1 % — ABNORMAL LOW (ref 39.0–52.0)
Hemoglobin: 11.4 g/dL — ABNORMAL LOW (ref 13.0–17.0)
MCH: 31.8 pg (ref 26.0–34.0)
MCHC: 32.5 g/dL (ref 30.0–36.0)
MCV: 97.8 fL (ref 78.0–100.0)
Platelets: 217 K/uL (ref 150–400)
RBC: 3.59 MIL/uL — ABNORMAL LOW (ref 4.22–5.81)
RDW: 15.6 % — ABNORMAL HIGH (ref 11.5–15.5)
WBC: 16.2 K/uL — ABNORMAL HIGH (ref 4.0–10.5)

## 2011-06-01 LAB — GLUCOSE, CAPILLARY
Glucose-Capillary: 117 mg/dL — ABNORMAL HIGH (ref 70–99)
Glucose-Capillary: 158 mg/dL — ABNORMAL HIGH (ref 70–99)

## 2011-06-01 LAB — BASIC METABOLIC PANEL WITH GFR
BUN: 13 mg/dL (ref 6–23)
CO2: 25 meq/L (ref 19–32)
Calcium: 8.3 mg/dL — ABNORMAL LOW (ref 8.4–10.5)
Chloride: 101 meq/L (ref 96–112)
Creatinine, Ser: 1.28 mg/dL (ref 0.50–1.35)
GFR calc Af Amer: 61 mL/min — ABNORMAL LOW
GFR calc non Af Amer: 53 mL/min — ABNORMAL LOW
Glucose, Bld: 89 mg/dL (ref 70–99)
Potassium: 3.8 meq/L (ref 3.5–5.1)
Sodium: 135 meq/L (ref 135–145)

## 2011-06-01 MED ORDER — LINAGLIPTIN 5 MG PO TABS
5.0000 mg | ORAL_TABLET | Freq: Every day | ORAL | Status: DC
  Administered 2011-06-01 – 2011-06-04 (×4): 5 mg via ORAL
  Filled 2011-06-01 (×4): qty 1

## 2011-06-01 MED ORDER — SODIUM CHLORIDE 0.9 % IJ SOLN
3.0000 mL | Freq: Two times a day (BID) | INTRAMUSCULAR | Status: DC
  Administered 2011-06-01 (×3): 3 mL via INTRAVENOUS

## 2011-06-01 MED ORDER — INSULIN ASPART 100 UNIT/ML ~~LOC~~ SOLN
0.0000 [IU] | Freq: Every day | SUBCUTANEOUS | Status: DC
  Administered 2011-06-01: 3 [IU] via SUBCUTANEOUS
  Administered 2011-06-02: 2 [IU] via SUBCUTANEOUS
  Administered 2011-06-03: 4 [IU] via SUBCUTANEOUS

## 2011-06-01 MED ORDER — INSULIN ASPART 100 UNIT/ML ~~LOC~~ SOLN
0.0000 [IU] | Freq: Three times a day (TID) | SUBCUTANEOUS | Status: DC
  Administered 2011-06-01: 4 [IU] via SUBCUTANEOUS
  Administered 2011-06-01: 3 [IU] via SUBCUTANEOUS
  Administered 2011-06-02: 11 [IU] via SUBCUTANEOUS
  Administered 2011-06-02: 3 [IU] via SUBCUTANEOUS
  Administered 2011-06-03: 11 [IU] via SUBCUTANEOUS
  Administered 2011-06-03: 4 [IU] via SUBCUTANEOUS
  Administered 2011-06-03: 3 [IU] via SUBCUTANEOUS
  Administered 2011-06-04: 11 [IU] via SUBCUTANEOUS

## 2011-06-01 MED ORDER — MOXIFLOXACIN HCL IN NACL 400 MG/250ML IV SOLN
400.0000 mg | Freq: Every day | INTRAVENOUS | Status: DC
  Administered 2011-06-01: 400 mg via INTRAVENOUS
  Filled 2011-06-01: qty 250

## 2011-06-01 MED ORDER — MOXIFLOXACIN HCL IN NACL 400 MG/250ML IV SOLN: 400.0000 mg | INTRAVENOUS | Status: DC

## 2011-06-01 MED ORDER — VITAMIN D 1000 UNITS PO TABS
1000.0000 [IU] | ORAL_TABLET | Freq: Two times a day (BID) | ORAL | Status: DC
  Administered 2011-06-01 – 2011-06-04 (×7): 1000 [IU] via ORAL
  Filled 2011-06-01 (×8): qty 1

## 2011-06-01 MED ORDER — CLOPIDOGREL BISULFATE 75 MG PO TABS
75.0000 mg | ORAL_TABLET | Freq: Every day | ORAL | Status: DC
  Administered 2011-06-01 – 2011-06-03 (×3): 75 mg via ORAL
  Filled 2011-06-01 (×4): qty 1

## 2011-06-01 MED ORDER — EXENATIDE 10 MCG/0.04ML ~~LOC~~ SOPN
10.0000 ug | PEN_INJECTOR | Freq: Two times a day (BID) | SUBCUTANEOUS | Status: DC
  Filled 2011-06-01 (×2): qty 0.04

## 2011-06-01 MED ORDER — SODIUM CHLORIDE 0.9 % IV SOLN: 250.0000 mL | INTRAVENOUS | Status: DC | PRN

## 2011-06-01 MED ORDER — GLIPIZIDE 10 MG PO TABS
20.0000 mg | ORAL_TABLET | Freq: Every day | ORAL | Status: DC
  Administered 2011-06-01 – 2011-06-04 (×4): 20 mg via ORAL
  Filled 2011-06-01 (×4): qty 2

## 2011-06-01 MED ORDER — ENOXAPARIN SODIUM 40 MG/0.4ML ~~LOC~~ SOLN
40.0000 mg | SUBCUTANEOUS | Status: DC
  Administered 2011-06-01 – 2011-06-02 (×2): 40 mg via SUBCUTANEOUS
  Filled 2011-06-01 (×2): qty 0.4

## 2011-06-01 MED ORDER — LEVOTHYROXINE SODIUM 150 MCG PO TABS
150.0000 ug | ORAL_TABLET | Freq: Every day | ORAL | Status: DC
  Administered 2011-06-01 – 2011-06-02 (×2): 150 ug via ORAL
  Filled 2011-06-01 (×3): qty 1

## 2011-06-01 MED ORDER — METOPROLOL TARTRATE 25 MG PO TABS
25.0000 mg | ORAL_TABLET | Freq: Two times a day (BID) | ORAL | Status: DC
  Administered 2011-06-01 – 2011-06-04 (×8): 25 mg via ORAL
  Filled 2011-06-01 (×9): qty 1

## 2011-06-01 MED ORDER — SODIUM CHLORIDE 0.9 % IJ SOLN: 3.0000 mL | INTRAMUSCULAR | Status: DC | PRN

## 2011-06-01 MED ORDER — COLESEVELAM HCL 625 MG PO TABS
1875.0000 mg | ORAL_TABLET | Freq: Every day | ORAL | Status: DC
  Administered 2011-06-01 – 2011-06-03 (×3): 1875 mg via ORAL
  Filled 2011-06-01 (×4): qty 3

## 2011-06-01 MED ORDER — GABAPENTIN 300 MG PO CAPS
300.0000 mg | ORAL_CAPSULE | Freq: Three times a day (TID) | ORAL | Status: DC
  Administered 2011-06-01 – 2011-06-04 (×10): 300 mg via ORAL
  Filled 2011-06-01 (×12): qty 1

## 2011-06-01 MED ORDER — ASPIRIN EC 81 MG PO TBEC
81.0000 mg | DELAYED_RELEASE_TABLET | Freq: Every day | ORAL | Status: DC
  Administered 2011-06-01 – 2011-06-04 (×4): 81 mg via ORAL
  Filled 2011-06-01 (×4): qty 1

## 2011-06-01 MED ORDER — PANTOPRAZOLE SODIUM 40 MG PO TBEC
40.0000 mg | DELAYED_RELEASE_TABLET | Freq: Every day | ORAL | Status: DC
  Administered 2011-06-01 – 2011-06-04 (×4): 40 mg via ORAL
  Filled 2011-06-01 (×4): qty 1

## 2011-06-01 MED ORDER — ALLOPURINOL 150 MG HALF TABLET
150.0000 mg | ORAL_TABLET | Freq: Every day | ORAL | Status: DC
  Administered 2011-06-01 – 2011-06-03 (×3): 150 mg via ORAL
  Filled 2011-06-01 (×4): qty 1

## 2011-06-01 MED ORDER — TIOTROPIUM BROMIDE MONOHYDRATE 18 MCG IN CAPS
18.0000 ug | ORAL_CAPSULE | Freq: Every day | RESPIRATORY_TRACT | Status: DC
  Administered 2011-06-01 – 2011-06-03 (×3): 18 ug via RESPIRATORY_TRACT
  Filled 2011-06-01: qty 5

## 2011-06-01 MED ORDER — FUROSEMIDE 10 MG/ML IJ SOLN
40.0000 mg | Freq: Every day | INTRAMUSCULAR | Status: DC
  Administered 2011-06-01: 40 mg via INTRAVENOUS
  Filled 2011-06-01 (×2): qty 4

## 2011-06-01 MED ORDER — SIMVASTATIN 40 MG PO TABS
40.0000 mg | ORAL_TABLET | Freq: Every day | ORAL | Status: DC
  Administered 2011-06-01 – 2011-06-03 (×3): 40 mg via ORAL
  Filled 2011-06-01 (×4): qty 1

## 2011-06-01 MED ORDER — FUROSEMIDE 10 MG/ML IJ SOLN
40.0000 mg | Freq: Two times a day (BID) | INTRAMUSCULAR | Status: DC
  Administered 2011-06-01 – 2011-06-02 (×2): 40 mg via INTRAVENOUS
  Filled 2011-06-01 (×3): qty 4

## 2011-06-01 MED ORDER — INSULIN GLARGINE 100 UNIT/ML ~~LOC~~ SOLN
50.0000 [IU] | Freq: Every day | SUBCUTANEOUS | Status: DC
  Administered 2011-06-02 – 2011-06-03 (×2): 50 [IU] via SUBCUTANEOUS

## 2011-06-01 NOTE — H&P (Signed)
PCP:   [name], MD, MD   Chief Complaint: Increase shortness of breath   HPI: [name] is an 78 y.o. male with history of hypertension, coronary disease, congestive heart failure, COPD, sleep apnea, type 2 diabetes presents to the emergency room for progressive increase shortness of breath for the past 3 days. He denied any chest pain, fever, chills, but admitted to having white sputum productive cough. He stated he has gained 8 pounds over the past few days since eating ham for Easter. He denied orthopnea or PND. Evaluation in emergency room included a chest x-ray which shows vascular congestion but no definite infiltrate, leukocytosis with white count 17,000 creatinine of 1.4, BNP of 1252, and negative lactic acid and Procalcitonin. Hospitalist was asked to admit him for UTI and possible early pneumonia. I think he has volume overload as well.  Rewiew of Systems:  The patient denies anorexia, fever, weight loss,, vision loss, decreased hearing, hoarseness, chest pain, syncope, , peripheral edema, balance deficits, hemoptysis, abdominal pain, melena, hematochezia, severe indigestion/heartburn, hematuria, incontinence, genital sores, muscle weakness, suspicious skin lesions, transient blindness, difficulty walking, depression, unusual weight change, abnormal bleeding, enlarged lymph nodes, angioedema, and breast masses.    Past Medical History  Diagnosis Date  . Diabetes mellitus type II   . Sleep apnea     sleep study done several years ago  . Hypothyroidism   . Hypertension   . Pneumonia   . GERD (gastroesophageal reflux disease)   . Arthritis   . Angina   . Shortness of breath     with exertion  . CHF (congestive heart failure)   . Heart attack   . COPD (chronic obstructive pulmonary disease)   . Coronary artery disease     Past Surgical History  Procedure Date  . Gallbladder surgery   . Back surgery   . Heart stents   . Heart bypass   . Finger surgery right  little  . Cholecystectomy   . Coronary artery bypass graft   . Tonsillectomy   . Cardiac catheterization     does not remember last time  . Knee arthroscopy     left  . Lumbar laminectomy/decompression microdiscectomy 04/11/2011    Procedure: LUMBAR LAMINECTOMY/DECOMPRESSION MICRODISCECTOMY 1 LEVEL;  Surgeon: [name], MD;  Location: [facility];  Service: Neurosurgery;  Laterality: Right;  RIGHT Lumbar Three-Four laminectomy with resection of synovial cyst    Medications:  HOME MEDS: Prior to Admission medications   Medication Sig Start Date End Date Taking? Authorizing Provider  allopurinol (ZYLOPRIM) 300 MG tablet Take 150 mg by mouth at bedtime.    Yes Historical Provider, MD  aspirin EC 81 MG tablet Take 81 mg by mouth daily.    Yes Historical Provider, MD  Cholecalciferol (VITAMIN D3) 1000 UNITS CAPS Take 1 capsule by mouth 2 (two) times daily.    Yes Historical Provider, MD  clopidogrel (PLAVIX) 75 MG tablet Take 75 mg by mouth at bedtime. Stopped on 1-30   Yes Historical Provider, MD  colesevelam (WELCHOL) 625 MG tablet Take 1,875 mg by mouth at bedtime.    Yes Historical Provider, MD  Exenatide (BYETTA 10 MCG PEN [place]) Inject 10 Units into the skin 2 (two) times daily.    Yes Historical Provider, MD  furosemide (LASIX) 40 MG tablet Take 20-40 mg by mouth daily. Take one-half  tablet for 4 days and take 1 tablet for 3 days   Yes Historical Provider, MD  gabapentin (NEURONTIN) 300  MG capsule Take 300 mg by mouth 3 (three) times daily.    Yes Historical Provider, MD  glipiZIDE (GLUCOTROL) 10 MG tablet Take 20 mg by mouth daily.    Yes Historical Provider, MD  HYDROcodone-acetaminophen (LORTAB) 10-500 MG per tablet Take 1 tablet by mouth every 4 (four) hours as needed. For pain 10/21/10  Yes Historical Provider, MD  insulin glargine (LANTUS) 100 UNIT/ML injection Inject 50 Units into the skin at bedtime.    Yes Historical Provider, MD  levothyroxine (SYNTHROID, LEVOTHROID) 150  MCG tablet Take 150 mcg by mouth at bedtime.    Yes Historical Provider, MD  metoprolol tartrate (LOPRESSOR) 25 MG tablet Take 25 mg by mouth 2 (two) times daily.    Yes Historical Provider, MD  nitrofurantoin, macrocrystal-monohydrate, (MACROBID) 100 MG capsule Take 100 mg by mouth 2 (two) times daily.   Yes Historical Provider, MD  omeprazole (PRILOSEC) 20 MG capsule Take 20 mg by mouth at bedtime.    Yes Historical Provider, MD  simvastatin (ZOCOR) 40 MG tablet Take 40 mg by mouth at bedtime.   Yes Historical Provider, MD  [name] (JANUVIA) 100 MG tablet Take 100 mg by mouth 2 (two) times daily.    Yes Historical Provider, MD  Tiotropium Bromide Monohydrate (SPIRIVA HANDIHALER IN) Inhale 1-2 capsules into the lungs daily. Twice a day if needed for breathing   Yes Historical Provider, MD     Allergies:  Allergies  Allergen Reactions  . Demerol Nausea And Vomiting    Social History:   reports that he has quit smoking. He does not have any smokeless tobacco history on file. He reports that he does not drink alcohol or use illicit drugs.  Family History: Family History  Problem Relation Age of Onset  . Arthritis    . Cancer    . Diabetes       Physical Exam: Filed Vitals:   05/31/11 2245 05/31/11 2330 06/01/11 0015 06/01/11 0045  BP:      Pulse: 81 78 33 71  Temp:  99.3 F (37.4 C)    TempSrc:      Resp: 32 27 30 21   SpO2: 99% 96% 77% 99%   Blood pressure 157/88, pulse 71, temperature 99.3 F (37.4 C), temperature source Oral, resp. rate 21, SpO2 99.00%.  GEN:   person lying in the stretcher in no acute distress; cooperative with exam. He does appear quite depressed PSYCH:  alert and oriented x4; does not appear anxious ; affect is appropriate. HEENT: Mucous membranes pink and anicteric; PERRLA; EOM intact; no cervical lymphadenopathy nor thyromegaly or carotid bruit; no JVD; Breasts:: Not examined CHEST WALL: No tenderness CHEST: Normal respiration, minimal wheezes,  mild lateral crackles at both bases HEART: Regular rate and rhythm; no murmurs rubs or gallops BACK: No kyphosis or scoliosis; no CVA tenderness ABDOMEN: Obese, soft non-tender; no masses, no organomegaly, normal abdominal bowel sounds; no pannus; no intertriginous candida. Rectal Exam: Not done EXTREMITIES: No bone or joint deformity; age-appropriate arthropathy of the hands and knees; no ulcerations. 1-2+ edema Genitalia: not examined PULSES: 2+ and symmetric SKIN: Normal hydration no rash or ulceration CNS: Cranial nerves 2-12 grossly intact no focal lateralizing neurologic deficit   Labs & Imaging Results for orders placed during the hospital encounter of 05/31/11 (from the past 48 hour(s))  GLUCOSE, CAPILLARY     Status: Normal   Collection Time   05/31/11  8:31 PM      Component Value Range Comment   Glucose-Capillary 75  70 - 99 (mg/dL)   CBC     Status: Abnormal   Collection Time   05/31/11  8:56 PM      Component Value Range Comment   WBC 17.2 (*) 4.0 - 10.5 (K/uL)    RBC 3.77 (*) 4.22 - 5.81 (MIL/uL)    Hemoglobin 12.0 (*) 13.0 - 17.0 (g/dL)    HCT 78.2 (*) 95.6 - 52.0 (%)    MCV 99.2  78.0 - 100.0 (fL)    MCH 31.8  26.0 - 34.0 (pg)    MCHC 32.1  30.0 - 36.0 (g/dL)    RDW 21.3 (*) 08.6 - 15.5 (%)    Platelets 240  150 - 400 (K/uL)   DIFFERENTIAL     Status: Abnormal   Collection Time   05/31/11  8:56 PM      Component Value Range Comment   Neutrophils Relative 89 (*) 43 - 77 (%)    Neutro Abs 15.3 (*) 1.7 - 7.7 (K/uL)    Lymphocytes Relative 3 (*) 12 - 46 (%)    Lymphs Abs 0.6 (*) 0.7 - 4.0 (K/uL)    Monocytes Relative 6  3 - 12 (%)    Monocytes Absolute 1.1 (*) 0.1 - 1.0 (K/uL)    Eosinophils Relative 2  0 - 5 (%)    Eosinophils Absolute 0.3  0.0 - 0.7 (K/uL)    Basophils Relative 0  0 - 1 (%)    Basophils Absolute 0.0  0.0 - 0.1 (K/uL)   COMPREHENSIVE METABOLIC PANEL     Status: Abnormal   Collection Time   05/31/11  8:56 PM      Component Value Range Comment    Sodium 132 (*) 135 - 145 (mEq/L)    Potassium 4.0  3.5 - 5.1 (mEq/L)    Chloride 97  96 - 112 (mEq/L)    CO2 27  19 - 32 (mEq/L)    Glucose, Bld 80  70 - 99 (mg/dL)    BUN 12  6 - 23 (mg/dL)    Creatinine, Ser 5.78 (*) 0.50 - 1.35 (mg/dL)    Calcium 9.0  8.4 - 10.5 (mg/dL)    Total Protein 7.3  6.0 - 8.3 (g/dL)    Albumin 3.2 (*) 3.5 - 5.2 (g/dL)    AST 23  0 - 37 (U/L)    ALT 10  0 - 53 (U/L)    Alkaline Phosphatase 94  39 - 117 (U/L)    Total Bilirubin 1.0  0.3 - 1.2 (mg/dL)    GFR calc non Af Amer 48 (*) >90 (mL/min)    GFR calc Af Amer 56 (*) >90 (mL/min)   LACTIC ACID, PLASMA     Status: Normal   Collection Time   05/31/11  8:56 PM      Component Value Range Comment   Lactic Acid, Venous 1.4  0.5 - 2.2 (mmol/L)   PROCALCITONIN     Status: Normal   Collection Time   05/31/11  8:56 PM      Component Value Range Comment   Procalcitonin 0.32     PRO B NATRIURETIC PEPTIDE     Status: Abnormal   Collection Time   05/31/11  8:56 PM      Component Value Range Comment   Pro B Natriuretic peptide (BNP) 1252.0 (*) 0 - 450 (pg/mL)   URINALYSIS, ROUTINE W REFLEX MICROSCOPIC     Status: Abnormal   Collection Time   05/31/11  9:04 PM  Component Value Range Comment   Color, Urine YELLOW  YELLOW     APPearance CLEAR  CLEAR     Specific Gravity, Urine 1.020  1.005 - 1.030     pH 5.0  5.0 - 8.0     Glucose, UA NEGATIVE  NEGATIVE (mg/dL)    Hgb urine dipstick NEGATIVE  NEGATIVE     Bilirubin Urine NEGATIVE  NEGATIVE     Ketones, ur NEGATIVE  NEGATIVE (mg/dL)    Protein, ur 30 (*) NEGATIVE (mg/dL)    Urobilinogen, UA 0.2  0.0 - 1.0 (mg/dL)    Nitrite NEGATIVE  NEGATIVE     Leukocytes, UA SMALL (*) NEGATIVE    URINE MICROSCOPIC-ADD ON     Status: Normal   Collection Time   05/31/11  9:04 PM      Component Value Range Comment   Squamous Epithelial / LPF RARE  RARE     WBC, UA 3-6  <3 (WBC/hpf)    Bacteria, UA RARE  RARE     Dg Chest Port 1 View  05/31/2011  *RADIOLOGY REPORT*  Clinical  Data: Shortness of breath.  PORTABLE CHEST - 1 VIEW  Comparison: 09/01/2010.  Findings: The heart is enlarged but stable.  Stable surgical changes from bypass surgery.  There is vascular congestion and mild interstitial edema suggesting CHF.  No definite pleural effusions. Stable elevation of the right hemidiaphragm.  IMPRESSION: Cardiac enlargement with vascular congestion and mild interstitial edema.  Original Report Authenticated By: P. Loralie Champagne, M.D.      Assessment Present on Admission:  .Shortness of breath .CHF (congestive heart failure) .UTI (lower urinary tract infection) .Back pain .DM (diabetes mellitus) .Hypothyroidism .HTN (hypertension) .COPD (chronic obstructive pulmonary disease) .Obesity .Depression   PLAN: It is possible that he has early pneumonia, and we'll treat him with Avelox for both pulmonary infection and possible UTI. I do think his volume overload and will change to by mouth Lasix to IV Lasix. This may be secondary to higher salt intake as he ate ham for Easter. His back pain is chronic and will continue to treat him as needed with pain medication. For his diabetes we'll continue his basal insulin along with no coverage, check blood glucose of Q. a.c. and at bedtime, and cover with resistant insulin sliding scale. I will also continue his thyroid supplement and check his TSH. I also noted that he is depressed, told him and his family about it, and for him to discuss with his primary care physician and followup to consider starting an antidepressant. He is a full code, stable, and will be admitted to triad hospitalist service.  Other plans as per orders.    Demetra Moya 06/01/2011, 1:56 AM

## 2011-06-01 NOTE — Progress Notes (Signed)
UR completed 

## 2011-06-01 NOTE — Consult Note (Signed)
Pt. Seen and examined. Agree with the NP/PA-C note as written. Pleasant 78 yo male with a history of CABG and vertebral stent in the past. He presents with increasing shortness of breath and weight gain.  He is also found to have a UTI. He has some mild renal insufficiency which is improving with diuretics. Will re-check a 2D echo to look for any changes in EF, however, this may be in part due to dietary indiscretion.   Chrystie Nose, MD, [facility] Attending Cardiologist [facility]

## 2011-06-01 NOTE — Progress Notes (Signed)
Subjective: Patient and wife both state that the impetus for her than coming to the emergency room was the 8 pound weight gain it was reported by [facility] disease [facility] as well as his one episode of emesis which after speaking with was posttussive. The patient states that he feels much better today. Objective: Filed Vitals:   06/01/11 0528 06/01/11 0648 06/01/11 0931 06/01/11 1519  BP: 121/79 122/45 137/54 127/72  Pulse: 59 62 61 57  Temp:  98.3 F (36.8 C)  98.6 F (37 C)  TempSrc:  Oral  Oral  Resp:  22  20  Height:      Weight:      SpO2:  97%  100%   Weight change:   Intake/Output Summary (Last 24 hours) at 06/01/11 1641 Last data filed at 06/01/11 1415  Gross per 24 hour  Intake   1156 ml  Output    550 ml  Net    606 ml    General: Alert, awake, oriented x3, in no acute distress.  HEENT: [place]/AT PEERL, EOMI Neck: Trachea midline,  no masses, no thyromegal,y no JVD, no carotid bruit OROPHARYNX:  Moist, No exudate/ erythema/lesions.  Heart: Regular rate and rhythm, without murmurs, rubs, gallops, PMI non-displaced, no heaves or thrills on palpation.  Lungs: Clear to auscultation, no wheezing or rhonchi noted. No increased vocal fremitus resonant to percussion  Abdomen: Soft, nontender, nondistended, positive bowel sounds, no masses no hepatosplenomegaly noted..  Neuro: No focal neurological deficits noted cranial nerves II through XII grossly intact. DTRs 2+ bilaterally upper and lower extremities. Strength 5 out of 5 in bilateral upper and lower extremities. Musculoskeletal: No warm swelling or erythema around joints, no spinal tenderness noted. Psychiatric: Patient alert and oriented x3, good insight and cognition, good recent to remote recall. Lymph node survey: No cervical axillary or inguinal lymphadenopathy noted.     Lab Results:  [facility] 06/01/11 0452 05/31/11 2056  NA 135 132*  K 3.8 4.0  CL 101 97  CO2 25 27  GLUCOSE 89 80  BUN 13 12    CREATININE 1.28 1.38*  CALCIUM 8.3* 9.0  MG -- --  PHOS -- --    Basename 05/31/11 2056  AST 23  ALT 10  ALKPHOS 94  BILITOT 1.0  PROT 7.3  ALBUMIN 3.2*   No results found for this basename: LIPASE:2,AMYLASE:2 in the last 72 hours  Basename 06/01/11 0452 05/31/11 2056  WBC 16.2* 17.2*  NEUTROABS -- 15.3*  HGB 11.4* 12.0*  HCT 35.1* 37.4*  MCV 97.8 99.2  PLT 217 240   No results found for this basename: CKTOTAL:3,CKMB:3,CKMBINDEX:3,TROPONINI:3 in the last 72 hours No components found with this basename: POCBNP:3 No results found for this basename: DDIMER:2 in the last 72 hours No results found for this basename: HGBA1C:2 in the last 72 hours No results found for this basename: CHOL:2,HDL:2,LDLCALC:2,TRIG:2,CHOLHDL:2,LDLDIRECT:2 in the last 72 hours No results found for this basename: TSH,T4TOTAL,FREET3,T3FREE,THYROIDAB in the last 72 hours No results found for this basename: VITAMINB12:2,FOLATE:2,FERRITIN:2,TIBC:2,IRON:2,RETICCTPCT:2 in the last 72 hours  Micro Results: No results found for this or any previous visit (from the past 240 hour(s)).  Studies/Results: Dg Chest Port 1 View  05/31/2011  *RADIOLOGY REPORT*  Clinical Data: Shortness of breath.  PORTABLE CHEST - 1 VIEW  Comparison: 09/01/2010.  Findings: The heart is enlarged but stable.  Stable surgical changes from bypass surgery.  There is vascular congestion and mild interstitial edema suggesting CHF.  No definite pleural effusions. Stable elevation of  the right hemidiaphragm.  IMPRESSION: Cardiac enlargement with vascular congestion and mild interstitial edema.  Original Report Authenticated By: P. Loralie Champagne, M.D.    Medications: I have reviewed the patient's current medications. Scheduled Meds:   . sodium chloride  1,000 mL Intravenous Once  . allopurinol  150 mg Oral QHS  . aspirin EC  81 mg Oral Daily  . cholecalciferol  1,000 Units Oral BID  . clopidogrel  75 mg Oral QHS  . colesevelam  1,875 mg  Oral QHS  . enoxaparin  40 mg Subcutaneous Q24H  . exenatide  10 mcg Subcutaneous BID WC  . furosemide  40 mg Intravenous Daily  . gabapentin  300 mg Oral TID  . glipiZIDE  20 mg Oral Daily  . insulin aspart  0-20 Units Subcutaneous TID WC  . insulin aspart  0-5 Units Subcutaneous QHS  . insulin glargine  50 Units Subcutaneous QHS  . levothyroxine  150 mcg Oral QHS  . linagliptin  5 mg Oral Daily  . metoprolol tartrate  25 mg Oral BID  . moxifloxacin  400 mg Intravenous Once  . moxifloxacin  400 mg Intravenous QHS  . pantoprazole  40 mg Oral Q1200  . simvastatin  40 mg Oral QHS  . sodium chloride  3 mL Intravenous Q12H  . tiotropium  18 mcg Inhalation Daily  . DISCONTD: moxifloxacin  400 mg Intravenous Q24H   Continuous Infusions:   . DISCONTD: sodium chloride 1,000 mL (05/31/11 2225)   PRN Meds:.sodium chloride, sodium chloride Assessment/Plan: Patient Active Hospital Problem List: Shortness of breath (06/01/2011); I suspect that most of this patient's shortness of breath is secondary to acute on chronic diastolic heart failure indicated by his increasing weight. I the patient is receiving diuretics and will continue. I've asked [name] to see the patient in consultation as he has been followed this patient on a long-term basis for this particular problem and manages him in the outpatient setting.    DM (diabetes mellitus) (06/01/2011)   Assessment: Blood sugars adequately controlled    Hypothyroidism (06/01/2011)   Assessment: Continue Synthroid. Check TSH    HTN (hypertension) (06/01/2011)   Assessment: Blood pressure adequately controlled   COPD (chronic obstructive pulmonary disease) (06/01/2011)   Assessment: Patient has a history of COPD however I am not convinced that he hasn't acted significant component of this illness. We will continue albuterol treatments as needed no steroids indicated at this time    CAD (coronary artery disease) (06/01/2011)   Assessment: Continue  Plavix and Toprol.       LOS: 1 day

## 2011-06-01 NOTE — Consult Note (Signed)
Reason for Consult: Congestive heart failure management Referring Physician:   [name] is an 78 y.o. male.  HPI:    The patient is a 78 year old Caucasian male with a history of coronary artery disease status post bypass grafting in 1998 in 05/29/2006 he had a cardiac catheterization which revealed a patent LIMA to the LAD, an occluded circumflex vein graft and a patent vein graft to the RCA and ejection fraction of 50% and mild inferobasilar hypokinesia.  He had left vertebral artery PTA and stenting by Dr. Antony Contras in [place]. The vertebral artery was widely patent the time of the heart catheterization. Patient had a Myoview stress test February of 2012 which showed subtle ischemia in the circumflex territory. Patient also has a history of obstructive sleep apnea(uses CPAP) hypertension, hyperlipidemia, non-insulin-requiring diabetes and COPD. His moderate carotid artery disease and is followed by duplex ultrasound was most recently checked January 2013 and revealed no change from prior study.  Patient also broke his right ankle this past Christmas and has had some chronic swelling from that.  The history was obtained from the patient and his wife. Patient reports that the yesterday he had walked all around [employer] at approximately 12 noon and upon department the store felt completely "worn out". His wife reports that he had no breakfast earlier in the day and only drank some Gatorade. He reports congestion, vomiting, lower extremity edema but denies cough fever he said the shortness of breath for several days without PND or chest pain. Patient's weight during his last office visit which was January 2013 was 265 pounds however the patient reports that after that visit his weight decreased to approximately 255. This past weekend he ate Easter ham and some other salty foods and that is when he noticed that the shortness of breath and orthopnea progressively worse and he started retaining more  fluid.  The patient weighs himself daily he is an electronic scale provided by [facility] [employer] and if she weighs himself twice daily and has been instructed by [name] to increase his Lasix if his weight should increase by a certain amount.  He's also had some trouble with recurrent urinary tract infections and most recently was started on Macrobid which he essentially just picked up the prescription.  The patient has been started on Avelox while inpatient.  Is check chest x-ray shows cardiac enlargement with vascular congestion and mild interstitial edema. In his BNP is elevated 1252.0.      Past Medical History  Diagnosis Date  . Diabetes mellitus type II   . Sleep apnea     sleep study done several years ago  . Hypothyroidism   . Hypertension   . Pneumonia   . GERD (gastroesophageal reflux disease)   . Arthritis   . Angina   . Shortness of breath     with exertion  . CHF (congestive heart failure)   . Heart attack   . COPD (chronic obstructive pulmonary disease)   . Coronary artery disease     Past Surgical History  Procedure Date  . Gallbladder surgery   . Back surgery   . Heart stents   . Heart bypass   . Finger surgery right little  . Cholecystectomy   . Coronary artery bypass graft   . Tonsillectomy   . Cardiac catheterization     does not remember last time  . Knee arthroscopy     left  . Lumbar laminectomy/decompression microdiscectomy 04/11/2011    Procedure: LUMBAR  LAMINECTOMY/DECOMPRESSION MICRODISCECTOMY 1 LEVEL;  Surgeon: [name], MD;  Location: [facility];  Service: Neurosurgery;  Laterality: Right;  RIGHT Lumbar Three-Four laminectomy with resection of synovial cyst    Family History  Problem Relation Age of Onset  . Arthritis    . Cancer    . Diabetes      Social History:  reports that he has quit smoking. He does not have any smokeless tobacco history on file. He reports that he does not drink alcohol or use illicit drugs.  Allergies:    Allergies  Allergen Reactions  . Demerol Nausea And Vomiting    Medications:     . sodium chloride  1,000 mL Intravenous Once  . allopurinol  150 mg Oral QHS  . aspirin EC  81 mg Oral Daily  . cholecalciferol  1,000 Units Oral BID  . clopidogrel  75 mg Oral QHS  . colesevelam  1,875 mg Oral QHS  . enoxaparin  40 mg Subcutaneous Q24H  . exenatide  10 mcg Subcutaneous BID WC  . furosemide  40 mg Intravenous Q12H  . gabapentin  300 mg Oral TID  . glipiZIDE  20 mg Oral Daily  . insulin aspart  0-20 Units Subcutaneous TID WC  . insulin aspart  0-5 Units Subcutaneous QHS  . insulin glargine  50 Units Subcutaneous QHS  . levothyroxine  150 mcg Oral QHS  . linagliptin  5 mg Oral Daily  . metoprolol tartrate  25 mg Oral BID  . moxifloxacin  400 mg Intravenous Once  . moxifloxacin  400 mg Intravenous QHS  . pantoprazole  40 mg Oral Q1200  . simvastatin  40 mg Oral QHS  . sodium chloride  3 mL Intravenous Q12H  . tiotropium  18 mcg Inhalation Daily  . DISCONTD: furosemide  40 mg Intravenous Daily  . DISCONTD: moxifloxacin  400 mg Intravenous Q24H     Results for orders placed during the hospital encounter of 05/31/11 (from the past 48 hour(s))  GLUCOSE, CAPILLARY     Status: Normal   Collection Time   05/31/11  8:31 PM      Component Value Range Comment   Glucose-Capillary 75  70 - 99 (mg/dL)   CBC     Status: Abnormal   Collection Time   05/31/11  8:56 PM      Component Value Range Comment   WBC 17.2 (*) 4.0 - 10.5 (K/uL)    RBC 3.77 (*) 4.22 - 5.81 (MIL/uL)    Hemoglobin 12.0 (*) 13.0 - 17.0 (g/dL)    HCT 16.1 (*) 09.6 - 52.0 (%)    MCV 99.2  78.0 - 100.0 (fL)    MCH 31.8  26.0 - 34.0 (pg)    MCHC 32.1  30.0 - 36.0 (g/dL)    RDW 04.5 (*) 40.9 - 15.5 (%)    Platelets 240  150 - 400 (K/uL)   DIFFERENTIAL     Status: Abnormal   Collection Time   05/31/11  8:56 PM      Component Value Range Comment   Neutrophils Relative 89 (*) 43 - 77 (%)    Neutro Abs 15.3 (*) 1.7 - 7.7  (K/uL)    Lymphocytes Relative 3 (*) 12 - 46 (%)    Lymphs Abs 0.6 (*) 0.7 - 4.0 (K/uL)    Monocytes Relative 6  3 - 12 (%)    Monocytes Absolute 1.1 (*) 0.1 - 1.0 (K/uL)    Eosinophils Relative 2  0 -  5 (%)    Eosinophils Absolute 0.3  0.0 - 0.7 (K/uL)    Basophils Relative 0  0 - 1 (%)    Basophils Absolute 0.0  0.0 - 0.1 (K/uL)   COMPREHENSIVE METABOLIC PANEL     Status: Abnormal   Collection Time   05/31/11  8:56 PM      Component Value Range Comment   Sodium 132 (*) 135 - 145 (mEq/L)    Potassium 4.0  3.5 - 5.1 (mEq/L)    Chloride 97  96 - 112 (mEq/L)    CO2 27  19 - 32 (mEq/L)    Glucose, Bld 80  70 - 99 (mg/dL)    BUN 12  6 - 23 (mg/dL)    Creatinine, Ser 0.98 (*) 0.50 - 1.35 (mg/dL)    Calcium 9.0  8.4 - 10.5 (mg/dL)    Total Protein 7.3  6.0 - 8.3 (g/dL)    Albumin 3.2 (*) 3.5 - 5.2 (g/dL)    AST 23  0 - 37 (U/L)    ALT 10  0 - 53 (U/L)    Alkaline Phosphatase 94  39 - 117 (U/L)    Total Bilirubin 1.0  0.3 - 1.2 (mg/dL)    GFR calc non Af Amer 48 (*) >90 (mL/min)    GFR calc Af Amer 56 (*) >90 (mL/min)   LACTIC ACID, PLASMA     Status: Normal   Collection Time   05/31/11  8:56 PM      Component Value Range Comment   Lactic Acid, Venous 1.4  0.5 - 2.2 (mmol/L)   PROCALCITONIN     Status: Normal   Collection Time   05/31/11  8:56 PM      Component Value Range Comment   Procalcitonin 0.32     PRO B NATRIURETIC PEPTIDE     Status: Abnormal   Collection Time   05/31/11  8:56 PM      Component Value Range Comment   Pro B Natriuretic peptide (BNP) 1252.0 (*) 0 - 450 (pg/mL)   URINALYSIS, ROUTINE [name] REFLEX MICROSCOPIC     Status: Abnormal   Collection Time   05/31/11  9:04 PM      Component Value Range Comment   Color, Urine YELLOW  YELLOW     APPearance CLEAR  CLEAR     Specific Gravity, Urine 1.020  1.005 - 1.030     pH 5.0  5.0 - 8.0     Glucose, UA NEGATIVE  NEGATIVE (mg/dL)    Hgb urine dipstick NEGATIVE  NEGATIVE     Bilirubin Urine NEGATIVE  NEGATIVE     Ketones,  ur NEGATIVE  NEGATIVE (mg/dL)    Protein, ur 30 (*) NEGATIVE (mg/dL)    Urobilinogen, UA 0.2  0.0 - 1.0 (mg/dL)    Nitrite NEGATIVE  NEGATIVE     Leukocytes, UA SMALL (*) NEGATIVE    URINE MICROSCOPIC-ADD ON     Status: Normal   Collection Time   05/31/11  9:04 PM      Component Value Range Comment   Squamous Epithelial / LPF RARE  RARE     WBC, UA 3-6  <3 (WBC/hpf)    Bacteria, UA RARE  RARE    BASIC METABOLIC PANEL     Status: Abnormal   Collection Time   06/01/11  4:52 AM      Component Value Range Comment   Sodium 135  135 - 145 (mEq/L)    Potassium 3.8  3.5 -  5.1 (mEq/L)    Chloride 101  96 - 112 (mEq/L)    CO2 25  19 - 32 (mEq/L)    Glucose, Bld 89  70 - 99 (mg/dL)    BUN 13  6 - 23 (mg/dL)    Creatinine, Ser 1.61  0.50 - 1.35 (mg/dL)    Calcium 8.3 (*) 8.4 - 10.5 (mg/dL)    GFR calc non Af Amer 53 (*) >90 (mL/min)    GFR calc Af Amer 61 (*) >90 (mL/min)   CBC     Status: Abnormal   Collection Time   06/01/11  4:52 AM      Component Value Range Comment   WBC 16.2 (*) 4.0 - 10.5 (K/uL)    RBC 3.59 (*) 4.22 - 5.81 (MIL/uL)    Hemoglobin 11.4 (*) 13.0 - 17.0 (g/dL)    HCT 09.6 (*) 04.5 - 52.0 (%)    MCV 97.8  78.0 - 100.0 (fL)    MCH 31.8  26.0 - 34.0 (pg)    MCHC 32.5  30.0 - 36.0 (g/dL)    RDW 40.9 (*) 81.1 - 15.5 (%)    Platelets 217  150 - 400 (K/uL)   GLUCOSE, CAPILLARY     Status: Abnormal   Collection Time   06/01/11  9:10 AM      Component Value Range Comment   Glucose-Capillary 117 (*) 70 - 99 (mg/dL)    Comment 1 Notify RN     GLUCOSE, CAPILLARY     Status: Abnormal   Collection Time   06/01/11 11:27 AM      Component Value Range Comment   Glucose-Capillary 158 (*) 70 - 99 (mg/dL)    Comment 1 Notify RN     GLUCOSE, CAPILLARY     Status: Abnormal   Collection Time   06/01/11  4:50 PM      Component Value Range Comment   Glucose-Capillary 136 (*) 70 - 99 (mg/dL)    Comment 1 Notify RN       Dg Chest Port 1 View  05/31/2011  *RADIOLOGY REPORT*  Clinical Data:  Shortness of breath.  PORTABLE CHEST - 1 VIEW  Comparison: 09/01/2010.  Findings: The heart is enlarged but stable.  Stable surgical changes from bypass surgery.  There is vascular congestion and mild interstitial edema suggesting CHF.  No definite pleural effusions. Stable elevation of the right hemidiaphragm.  IMPRESSION: Cardiac enlargement with vascular congestion and mild interstitial edema.  Original Report Authenticated By: P. Loralie Champagne, M.D.    Review of Systems  Constitutional: Negative for fever and diaphoresis.  HENT: Positive for congestion and sore throat.   Eyes: Negative for blurred vision and double vision.  Respiratory: Positive for cough (cough just started), sputum production and shortness of breath.   Cardiovascular: Positive for orthopnea (two-pillow) and leg swelling. Negative for chest pain, palpitations and PND.  Gastrointestinal: Positive for nausea and vomiting. Negative for abdominal pain, diarrhea, constipation, blood in stool and melena.  Genitourinary: Negative for dysuria and hematuria.  Musculoskeletal: Positive for back pain.  Neurological: Negative for headaches.   Blood pressure 127/72, pulse 57, temperature 98.6 F (37 C), temperature source Oral, resp. rate 20, height 5\' 11"  (1.803 m), weight 120.7 kg (266 lb 1.5 oz), SpO2 100.00%. Physical Exam  Constitutional: He is oriented to person, place, and time. No distress.       Obese Caucasian male  HENT:  Head: Normocephalic and atraumatic.  Mouth/Throat: No oropharyngeal exudate.  Oropharynx appears erythematous without exudate  Eyes: EOM are normal. Pupils are equal, round, and reactive to light. Left eye exhibits no discharge. No scleral icterus.  Neck: Normal range of motion. Neck supple. No JVD present.  Cardiovascular: Normal rate and regular rhythm.  Exam reveals no friction rub.   No murmur heard. Pulses:      Radial pulses are 2+ on the right side, and 2+ on the left side.        Dorsalis pedis pulses are 1+ on the right side, and 0 on the left side.       No carotid bruit auscultated.  GI: Soft. Bowel sounds are normal. He exhibits distension. There is tenderness.       Patient reports right upper quadrant was uncomfortable with palpation  Musculoskeletal: He exhibits edema.       1-2+ lower extremity edema greater in the right leg  Lymphadenopathy:    He has no cervical adenopathy.  Neurological: He is alert and oriented to person, place, and time. He exhibits normal muscle tone.  Skin: Skin is warm and dry.  Psychiatric: He has a normal mood and affect.    Assessment/Plan: Patient Active Hospital Problem List: Shortness of breath (06/01/2011) Back pain (10/20/2010) CHF (congestive heart failure) (06/01/2011) UTI (lower urinary tract infection) (06/01/2011) DM (diabetes mellitus) (06/01/2011) Hypothyroidism (06/01/2011) HTN (hypertension) (06/01/2011) COPD (chronic obstructive pulmonary disease) (06/01/2011) CAD (coronary artery disease) (06/01/2011) Obesity (06/01/2011) Depression (06/01/2011)  Plan:  The patient does appear to be fluid overloaded however upon entering the room he appears to be resting quite comfortably essentially supine position.  Is currently has Lasix IV 40 mg every 12 hours ordered. He's had mild improvement in his creatinine from 1.38 and 1.28 in resolution of his hyponatremia. His current net fluids are +606.  Did not report any chest pain or EKG have been completed and shows sinus rhythm at a rate of 79 beats per minute is a Q wave in lead 3 and this was seen on prior EKGs in the office. Patient's has an elevated white count and blood and urine cultures are pending. Urinalysis showed a small leukocytes. He is receiving Avelox for a urinary tract infection he had been previously switched as an outpatient to [facility] likely due to sensitivities from culture.  We'll order 2-D echocardiogram the patient doesn't remember receiving on in the last year.  Recommend  continued IV Lasix twice daily I will reassess tomorrow   [name] 06/01/2011, 5:18 PM

## 2011-06-01 NOTE — Progress Notes (Addendum)
CRITICAL VALUE ALERT  Critical value received:  BLOOD cULTURES GRAM POSITIVE COCCI CLUSTERS IN AEROBIC BOTTLE  Date of notification:  06/01/11  Time of notification:  2319  Critical value read back:yes  Nurse who received alert:  A Admiral Marcucci  MD notified (1st page):  [email or web address]  Time of first page:  2322  MD notified (2nd page):  Time of second page:  Responding MD:  Claiborne Billings  Time MD responded:  2328

## 2011-06-01 NOTE — Progress Notes (Signed)
Patient stated, " I don't take Byetta injections anymore because it is to hard to find the drug. The doctor's office was giving me samples."

## 2011-06-02 LAB — URINE CULTURE: Culture: NO GROWTH

## 2011-06-02 LAB — BASIC METABOLIC PANEL
BUN: 19 mg/dL (ref 6–23)
CO2: 26 mEq/L (ref 19–32)
Calcium: 8.7 mg/dL (ref 8.4–10.5)
Creatinine, Ser: 1.55 mg/dL — ABNORMAL HIGH (ref 0.50–1.35)
Glucose, Bld: 126 mg/dL — ABNORMAL HIGH (ref 70–99)

## 2011-06-02 LAB — DIFFERENTIAL
Basophils Relative: 0 % (ref 0–1)
Eosinophils Absolute: 0.2 10*3/uL (ref 0.0–0.7)
Lymphocytes Relative: 4 % — ABNORMAL LOW (ref 12–46)
Monocytes Absolute: 1.8 10*3/uL — ABNORMAL HIGH (ref 0.1–1.0)
Neutro Abs: 14.9 10*3/uL — ABNORMAL HIGH (ref 1.7–7.7)

## 2011-06-02 LAB — CBC
HCT: 37 % — ABNORMAL LOW (ref 39.0–52.0)
Hemoglobin: 12 g/dL — ABNORMAL LOW (ref 13.0–17.0)
MCH: 32.3 pg (ref 26.0–34.0)
MCHC: 32.4 g/dL (ref 30.0–36.0)

## 2011-06-02 LAB — GLUCOSE, CAPILLARY

## 2011-06-02 MED ORDER — LEVOFLOXACIN 750 MG PO TABS
750.0000 mg | ORAL_TABLET | Freq: Every day | ORAL | Status: DC
  Administered 2011-06-03 – 2011-06-04 (×2): 750 mg via ORAL
  Filled 2011-06-02 (×2): qty 1

## 2011-06-02 MED ORDER — VANCOMYCIN HCL 1000 MG IV SOLR
1500.0000 mg | INTRAVENOUS | Status: DC
  Administered 2011-06-03: 1500 mg via INTRAVENOUS
  Filled 2011-06-02: qty 1500

## 2011-06-02 MED ORDER — FUROSEMIDE 20 MG PO TABS: 20.0000 mg | ORAL_TABLET | ORAL | Status: DC

## 2011-06-02 MED ORDER — SODIUM CHLORIDE 0.9 % IJ SOLN
10.0000 mL | INTRAMUSCULAR | Status: DC | PRN
  Administered 2011-06-03: 10 mL

## 2011-06-02 MED ORDER — FUROSEMIDE 20 MG PO TABS
20.0000 mg | ORAL_TABLET | Freq: Two times a day (BID) | ORAL | Status: DC
  Administered 2011-06-02 – 2011-06-04 (×4): 20 mg via ORAL
  Filled 2011-06-02 (×5): qty 1

## 2011-06-02 MED ORDER — VANCOMYCIN HCL 1000 MG IV SOLR
2000.0000 mg | Freq: Once | INTRAVENOUS | Status: DC
  Filled 2011-06-02: qty 2000

## 2011-06-02 MED ORDER — CHLORHEXIDINE GLUCONATE CLOTH 2 % EX PADS
6.0000 | MEDICATED_PAD | Freq: Every day | CUTANEOUS | Status: DC
  Administered 2011-06-03: 6 via TOPICAL

## 2011-06-02 MED ORDER — VANCOMYCIN HCL 1000 MG IV SOLR
2000.0000 mg | Freq: Once | INTRAVENOUS | Status: AC
  Administered 2011-06-02: 2000 mg via INTRAVENOUS
  Filled 2011-06-02: qty 2000

## 2011-06-02 MED ORDER — LEVOFLOXACIN IN D5W 750 MG/150ML IV SOLN
750.0000 mg | INTRAVENOUS | Status: DC
  Administered 2011-06-02 (×2): 750 mg via INTRAVENOUS
  Filled 2011-06-02 (×2): qty 150

## 2011-06-02 MED ORDER — VANCOMYCIN HCL 1000 MG IV SOLR
1500.0000 mg | Freq: Every day | INTRAVENOUS | Status: DC
  Filled 2011-06-02: qty 1500

## 2011-06-02 MED ORDER — MUPIROCIN 2 % EX OINT
1.0000 "application " | TOPICAL_OINTMENT | Freq: Two times a day (BID) | CUTANEOUS | Status: DC
  Administered 2011-06-02 – 2011-06-04 (×3): 1 via NASAL
  Filled 2011-06-02: qty 22

## 2011-06-02 MED ORDER — SODIUM CHLORIDE 0.9 % IV SOLN
2000.0000 mg | Freq: Once | INTRAVENOUS | Status: DC
  Filled 2011-06-02: qty 2000

## 2011-06-02 MED ORDER — ENOXAPARIN SODIUM 60 MG/0.6ML ~~LOC~~ SOLN
60.0000 mg | SUBCUTANEOUS | Status: DC
  Administered 2011-06-03 – 2011-06-04 (×2): 60 mg via SUBCUTANEOUS
  Filled 2011-06-02 (×2): qty 0.6

## 2011-06-02 MED ORDER — SODIUM CHLORIDE 0.9 % IV SOLN
INTRAVENOUS | Status: DC
  Administered 2011-06-02: 20 mL via INTRAVENOUS

## 2011-06-02 MED ORDER — SPIRONOLACTONE 25 MG PO TABS: 25.0000 mg | ORAL_TABLET | Freq: Two times a day (BID) | ORAL | Status: DC

## 2011-06-02 MED ORDER — FUROSEMIDE 40 MG PO TABS: 40.0000 mg | ORAL_TABLET | Freq: Two times a day (BID) | ORAL | Status: DC

## 2011-06-02 MED ORDER — FUROSEMIDE 20 MG PO TABS
20.0000 mg | ORAL_TABLET | Freq: Two times a day (BID) | ORAL | Status: DC
  Filled 2011-06-02: qty 1

## 2011-06-02 MED ORDER — FUROSEMIDE 40 MG PO TABS
40.0000 mg | ORAL_TABLET | ORAL | Status: DC
  Filled 2011-06-02: qty 1

## 2011-06-02 NOTE — Progress Notes (Signed)
Vancomycin per pharmacy  Per RN, only about 1/3 of vancomycin dose infused prior to losing IV line.  NP Claiborne Billings aware of issue and ordered PICC line.  Pharmacy will await line placement and begin again with 2gm iv load, then 1500mg  iv q24hr  Luetta Nutting PharmD, BCPS  06/02/2011, 4:02 AM

## 2011-06-02 NOTE — Progress Notes (Signed)
Pt ambulating  ra sat 97%.

## 2011-06-02 NOTE — Progress Notes (Signed)
ANTIBIOTIC CONSULT NOTE - INITIAL  Pharmacy Consult for Vancomycin Indication: GPC in blood   Allergies  Allergen Reactions  . Demerol Nausea And Vomiting    Patient Measurements: Height: 5\' 11"  (180.3 cm) Weight: 266 lb 1.5 oz (120.7 kg) IBW/kg (Calculated) : 75.3  Adjusted Body Weight:   Vital Signs: Temp: 97.7 F (36.5 C) (04/04 2130) Temp src: Oral (04/04 2130) BP: 135/54 mmHg (04/04 2130) Pulse Rate: 62  (04/04 2130) Intake/Output from previous day: 04/04 0701 - 04/05 0700 In: 1156 [P.O.:1156] Out: 1375 [Urine:1375] Intake/Output from this shift: Total I/O In: -  Out: 675 [Urine:675]  Labs:  [facility] 06/01/11 0452 05/31/11 2056  WBC 16.2* 17.2*  HGB 11.4* 12.0*  PLT 217 240  LABCREA -- --  CREATININE 1.28 1.38*   Estimated Creatinine Clearance: 65.9 ml/min (by C-G formula based on Cr of 1.28). No results found for this basename: VANCOTROUGH:2,VANCOPEAK:2,VANCORANDOM:2,GENTTROUGH:2,GENTPEAK:2,GENTRANDOM:2,TOBRATROUGH:2,TOBRAPEAK:2,TOBRARND:2,AMIKACINPEAK:2,AMIKACINTROU:2,AMIKACIN:2, in the last 72 hours   Microbiology: Recent Results (from the past 720 hour(s))  CULTURE, BLOOD (ROUTINE X 2)     Status: Normal (Preliminary result)   Collection Time   05/31/11  8:50 PM      Component Value Range Status Comment   Specimen Description BLOOD RIGHT HAND   Final    Special Requests BOTTLES DRAWN AEROBIC ONLY   Final    Culture  Setup Time [id number]   Final    Culture     Final    Value: GRAM POSITIVE COCCI IN CLUSTERS     Note: Gram Stain Report Called to,Read Back By and Verified With: NEKA SHERARD @ 2308 ON 06/01/11 BY GOLLD   Report Status PENDING   Incomplete   URINE CULTURE     Status: Normal   Collection Time   05/31/11  9:04 PM      Component Value Range Status Comment   Specimen Description URINE, RANDOM   Final    Special Requests NONE   Final    Culture  Setup Time [id number]   Final    Colony Count NO GROWTH   Final    Culture NO GROWTH   Final     Report Status 06/02/2011 FINAL   Final     Medical History: Past Medical History  Diagnosis Date  . Diabetes mellitus type II   . Sleep apnea     sleep study done several years ago  . Hypothyroidism   . Hypertension   . Pneumonia   . GERD (gastroesophageal reflux disease)   . Arthritis   . Angina   . Shortness of breath     with exertion  . CHF (congestive heart failure)   . Heart attack   . COPD (chronic obstructive pulmonary disease)   . Coronary artery disease     Medications:  Anti-infectives     Start     Dose/Rate Route Frequency Ordered Stop   06/02/11 2200   vancomycin (VANCOCIN) 1,500 mg in sodium chloride 0.9 % 500 mL IVPB        1,500 mg 250 mL/hr over 120 Minutes Intravenous Daily at bedtime 06/02/11 0017     06/02/11 0015   vancomycin (VANCOCIN) 2,000 mg in sodium chloride 0.9 % 500 mL IVPB        2,000 mg 250 mL/hr over 120 Minutes Intravenous  Once 06/02/11 0015     06/01/11 2200   moxifloxacin (AVELOX) IVPB 400 mg        400 mg 250 mL/hr over 60 Minutes Intravenous Daily at bedtime  06/01/11 0156     06/01/11 0200   moxifloxacin (AVELOX) IVPB 400 mg  Status:  Discontinued        400 mg 250 mL/hr over 60 Minutes Intravenous Every 24 hours 06/01/11 0156 06/01/11 0203   06/01/11 0000   moxifloxacin (AVELOX) IVPB 400 mg        400 mg 250 mL/hr over 60 Minutes Intravenous  Once 05/31/11 2357 06/01/11 0125         Assessment: Patient with GPC in blood.  Vancomycin per pharmacy ordered.  Goal of Therapy:  Vancomycin trough level 15-20 mcg/ml  Plan:  Vancomycin 2gm iv x1, then 1500mg  iv q24hr  Darlina Guys, Euva Rundell Crowford 06/02/2011,12:18 AM

## 2011-06-02 NOTE — Progress Notes (Signed)
  Echocardiogram 2D Echocardiogram has been performed.  Jorje Guild [facility] 06/02/2011, 9:28 AM

## 2011-06-02 NOTE — Progress Notes (Signed)
Subjective: Patient states that he feels much better today.Has had a much better appetite today.  Appreciate cardiology input.  Interval History: BC showed GPC in clusters in 1/2 bottles. Vancomycin added Objective: Filed Vitals:   06/01/11 2130 06/02/11 0600 06/02/11 0951 06/02/11 1432  BP: 135/54 113/54 109/50 132/87  Pulse: 62 73 67 55  Temp: 97.7 F (36.5 C) 97.5 F (36.4 C)  98.3 F (36.8 C)  TempSrc: Oral Oral  Oral  Resp: 20 20  20   Height:      Weight:  120.8 kg (266 lb 5.1 oz)    SpO2: 100% 100%  100%   Weight change: 0.1 kg (3.5 oz)  Intake/Output Summary (Last 24 hours) at 06/02/11 1703 Last data filed at 06/02/11 1500  Gross per 24 hour  Intake   1143 ml  Output   1325 ml  Net   -182 ml    General: Alert, awake, oriented x3, in no acute distress. Looks well today. HEENT: [place]/AT PEERL, EOMI Neck: Trachea midline,  no masses, no thyromegal,y no JVD, no carotid bruit OROPHARYNX:  Moist, No exudate/ erythema/lesions.  Heart: Regular rate and rhythm, without murmurs, rubs, gallops, PMI non-displaced, no heaves or thrills on palpation.  Lungs: Clear to auscultation, no wheezing or rhonchi noted. No increased vocal fremitus resonant to percussion  Abdomen: Soft, nontender, nondistended, positive bowel sounds, no masses no hepatosplenomegaly noted..  Neuro: No focal neurological deficits noted  Musculoskeletal: No warm swelling or erythema around joints, no spinal tenderness noted.    Lab Results:  [facility] 06/02/11 0449 06/01/11 0452  NA 136 135  K 3.5 3.8  CL 99 101  CO2 26 25  GLUCOSE 126* 89  BUN 19 13  CREATININE 1.55* 1.28  CALCIUM 8.7 8.3*  MG -- --  PHOS -- --    Basename 05/31/11 2056  AST 23  ALT 10  ALKPHOS 94  BILITOT 1.0  PROT 7.3  ALBUMIN 3.2*   No results found for this basename: LIPASE:2,AMYLASE:2 in the last 72 hours  Basename 06/02/11 0935 06/01/11 0452 05/31/11 2056  WBC 17.6* 16.2* --  NEUTROABS 14.9* -- 15.3*  HGB 12.0*  11.4* --  HCT 37.0* 35.1* --  MCV 99.5 97.8 --  PLT 215 217 --   No results found for this basename: CKTOTAL:3,CKMB:3,CKMBINDEX:3,TROPONINI:3 in the last 72 hours No components found with this basename: POCBNP:3 No results found for this basename: DDIMER:2 in the last 72 hours No results found for this basename: HGBA1C:2 in the last 72 hours No results found for this basename: CHOL:2,HDL:2,LDLCALC:2,TRIG:2,CHOLHDL:2,LDLDIRECT:2 in the last 72 hours  Basename 06/02/11 0449  TSH 6.991*  T4TOTAL --  T3FREE --  THYROIDAB --   No results found for this basename: VITAMINB12:2,FOLATE:2,FERRITIN:2,TIBC:2,IRON:2,RETICCTPCT:2 in the last 72 hours  Micro Results: Recent Results (from the past 240 hour(s))  CULTURE, BLOOD (ROUTINE X 2)     Status: Normal (Preliminary result)   Collection Time   05/31/11  8:50 PM      Component Value Range Status Comment   Specimen Description BLOOD RIGHT HAND   Final    Special Requests BOTTLES DRAWN AEROBIC ONLY   Final    Culture  Setup Time [id number]   Final    Culture     Final    Value: GRAM POSITIVE COCCI IN CLUSTERS     Note: Gram Stain Report Called to,Read Back By and Verified With: NEKA SHERARD @ 2308 ON 06/01/11 BY GOLLD   Report Status PENDING   Incomplete  CULTURE, BLOOD (ROUTINE X 2)     Status: Normal (Preliminary result)   Collection Time   05/31/11  8:56 PM      Component Value Range Status Comment   Specimen Description BLOOD LEFT ANTECUBITAL   Final    Special Requests BOTTLES DRAWN AEROBIC AND ANAEROBIC 3CC   Final    Culture  Setup Time [id number]   Final    Culture     Final    Value:        BLOOD CULTURE RECEIVED NO GROWTH TO DATE CULTURE WILL BE HELD FOR 5 DAYS BEFORE ISSUING A FINAL NEGATIVE REPORT   Report Status PENDING   Incomplete   URINE CULTURE     Status: Normal   Collection Time   05/31/11  9:04 PM      Component Value Range Status Comment   Specimen Description URINE, RANDOM   Final    Special Requests NONE   Final      Culture  Setup Time [id number]   Final    Colony Count NO GROWTH   Final    Culture NO GROWTH   Final    Report Status 06/02/2011 FINAL   Final     Studies/Results: Dg Chest Port 1 View  05/31/2011  *RADIOLOGY REPORT*  Clinical Data: Shortness of breath.  PORTABLE CHEST - 1 VIEW  Comparison: 09/01/2010.  Findings: The heart is enlarged but stable.  Stable surgical changes from bypass surgery.  There is vascular congestion and mild interstitial edema suggesting CHF.  No definite pleural effusions. Stable elevation of the right hemidiaphragm.  IMPRESSION: Cardiac enlargement with vascular congestion and mild interstitial edema.  Original Report Authenticated By: P. Loralie Champagne, M.D.    Medications: I have reviewed the patient's current medications. Scheduled Meds:    . allopurinol  150 mg Oral QHS  . aspirin EC  81 mg Oral Daily  . cholecalciferol  1,000 Units Oral BID  . clopidogrel  75 mg Oral QHS  . colesevelam  1,875 mg Oral QHS  . enoxaparin  60 mg Subcutaneous Q24H  . exenatide  10 mcg Subcutaneous BID WC  . furosemide  20 mg Oral BID  . gabapentin  300 mg Oral TID  . glipiZIDE  20 mg Oral Daily  . insulin aspart  0-20 Units Subcutaneous TID WC  . insulin aspart  0-5 Units Subcutaneous QHS  . insulin glargine  50 Units Subcutaneous QHS  . levofloxacin  750 mg Oral Daily  . levothyroxine  150 mcg Oral QHS  . linagliptin  5 mg Oral Daily  . metoprolol tartrate  25 mg Oral BID  . pantoprazole  40 mg Oral Q1200  . simvastatin  40 mg Oral QHS  . sodium chloride  3 mL Intravenous Q12H  . tiotropium  18 mcg Inhalation Daily  . vancomycin  1,500 mg Intravenous Q24H  . vancomycin  2,000 mg Intravenous Once  . DISCONTD: enoxaparin  40 mg Subcutaneous Q24H  . DISCONTD: furosemide  40 mg Intravenous Q12H  . DISCONTD: furosemide  20 mg Oral BID  . DISCONTD: furosemide  20 mg Oral Custom  . DISCONTD: furosemide  40 mg Oral BID  . DISCONTD: furosemide  40 mg Oral Q M,W,F  .  DISCONTD: levofloxacin (LEVAQUIN) IV  750 mg Intravenous Q24H  . DISCONTD: moxifloxacin  400 mg Intravenous QHS  . DISCONTD: spironolactone  25 mg Oral BID  . DISCONTD: vancomycin  1,500 mg Intravenous QHS  . DISCONTD: vancomycin  2,000  mg Intravenous Once  . DISCONTD: vancomycin  2,000 mg Intravenous Once   Continuous Infusions:    . sodium chloride 20 mL (06/02/11 1121)   PRN Meds:.sodium chloride, sodium chloride, sodium chloride Assessment/Plan: Patient Active Hospital Problem List: Shortness of breath (06/01/2011)   Assessment: Pt's SOB markedly improved. Per cardiology, pt back to his dry weight. Oxygen saturation back to normal at rest. Will check saturations during ambulation.     DM (diabetes mellitus) (06/01/2011)   Assessment: Blood sugars adequately controlled    Hypothyroidism (06/01/2011)   Assessment: Continue Synthroid. TSH slightly elevated. Will increase synthroid to 200 mcg.   HTN (hypertension) (06/01/2011)   Assessment: Blood pressure adequately controlled   COPD (chronic obstructive pulmonary disease) (06/01/2011)   Assessment: Patient has a history of COPD however I am not convinced that he hasn't acted significant component of this illness. We will continue albuterol treatments as needed no steroids indicated at this time    CAD (coronary artery disease) (06/01/2011)   Assessment: Continue Plavix and Toprol.     GPC Bacteremia: Pt started on Vancomycin. Will continue until cultures resulted  LOS: 2 days

## 2011-06-02 NOTE — Progress Notes (Signed)
[facility]  Subjective: Feeling better.  No orthopnea.  He reports cold chills and diaphoresis last night.  Objective: Vital signs in last 24 hours: Temp:  [97.5 F (36.4 C)-98.6 F (37 C)] 97.5 F (36.4 C) (04/05 0600) Pulse Rate:  [57-73] 67  (04/05 0951) Resp:  [20] 20  (04/05 0600) BP: (109-135)/(50-72) 109/50 mmHg (04/05 0951) SpO2:  [100 %] 100 % (04/05 0600) Weight:  [120.8 kg (266 lb 5.1 oz)] 120.8 kg (266 lb 5.1 oz) (04/05 0600) Last BM Date: 05/31/11  Intake/Output from previous day: 04/04 0701 - 04/05 0700 In: 1156 [P.O.:1156] Out: 1375 [Urine:1375] Intake/Output this shift: Total I/O In: 290 [P.O.:290] Out: 200 [Urine:200]  Medications Current Facility-Administered Medications  Medication Dose Route Frequency Provider Last Rate Last Dose  . 0.9 %  sodium chloride infusion  250 mL Intravenous PRN Houston Siren, MD      . 0.9 %  sodium chloride infusion   Intravenous Continuous Osvaldo Shipper, MD 20 mL/hr at 06/02/11 1121 20 mL at 06/02/11 1121  . allopurinol (ZYLOPRIM) tablet 150 mg  150 mg Oral QHS Houston Siren, MD   150 mg at 06/01/11 2118  . aspirin EC tablet 81 mg  81 mg Oral Daily Houston Siren, MD   81 mg at 06/02/11 4098  . cholecalciferol (VITAMIN D) tablet 1,000 Units  1,000 Units Oral BID Houston Siren, MD   1,000 Units at 06/02/11 [phone]  . clopidogrel (PLAVIX) tablet 75 mg  75 mg Oral QHS Houston Siren, MD   75 mg at 06/01/11 2118  . colesevelam [facility]) tablet 1,875 mg  1,875 mg Oral QHS Houston Siren, MD   1,875 mg at 06/01/11 2118  . enoxaparin (LOVENOX) injection 60 mg  60 mg Subcutaneous Q24H Rollene Fare, PHARMD      . exenatide (BYETTA) injection SOLN 10 mcg  10 mcg Subcutaneous BID WC Houston Siren, MD      . furosemide (LASIX) injection 40 mg  40 mg Intravenous Q12H Altha Harm, MD   40 mg at 06/02/11 0930  . gabapentin (NEURONTIN) capsule 300 mg  300 mg Oral TID Houston Siren, MD   300 mg at 06/02/11 0953  . glipiZIDE (GLUCOTROL) tablet  20 mg  20 mg Oral Daily Houston Siren, MD   20 mg at 06/02/11 4782  . insulin aspart (novoLOG) injection 0-20 Units  0-20 Units Subcutaneous TID WC Houston Siren, MD   3 Units at 06/02/11 0955  . insulin aspart (novoLOG) injection 0-5 Units  0-5 Units Subcutaneous QHS Houston Siren, MD   3 Units at 06/01/11 1740  . insulin glargine (LANTUS) injection 50 Units  50 Units Subcutaneous QHS Houston Siren, MD      . levofloxacin [facility]) IVPB 750 mg  750 mg Intravenous Q24H Altha Harm, MD   750 mg at 06/02/11 1119  . levothyroxine (SYNTHROID, LEVOTHROID) tablet 150 mcg  150 mcg Oral QHS Houston Siren, MD   150 mcg at 06/01/11 2118  . linagliptin (TRADJENTA) tablet 5 mg  5 mg Oral Daily Houston Siren, MD   5 mg at 06/02/11 9562  . metoprolol tartrate (LOPRESSOR) tablet 25 mg  25 mg Oral BID Houston Siren, MD   25 mg at 06/02/11 0951  . pantoprazole (PROTONIX) EC tablet 40 mg  40 mg Oral Q1200 Houston Siren, MD   40 mg at 06/01/11 1144  . simvastatin (ZOCOR) tablet 40 mg  40 mg Oral QHS Houston Siren, MD   40  mg at 06/01/11 2118  . sodium chloride 0.9 % injection 10-40 mL  10-40 mL Intracatheter PRN Osvaldo Shipper, MD      . sodium chloride 0.9 % injection 3 mL  3 mL Intravenous Q12H Houston Siren, MD   3 mL at 06/01/11 2313  . sodium chloride 0.9 % injection 3 mL  3 mL Intravenous PRN Houston Siren, MD      . tiotropium [facility]) inhalation capsule 18 mcg  18 mcg Inhalation Daily Houston Siren, MD   18 mcg at 06/02/11 0929  . vancomycin (VANCOCIN) 1,500 mg in sodium chloride 0.9 % 500 mL IVPB  1,500 mg Intravenous Q24H Rollene Fare, PHARMD      . vancomycin (VANCOCIN) 2,000 mg in sodium chloride 0.9 % 500 mL IVPB  2,000 mg Intravenous Once Rollene Fare, PHARMD      . DISCONTD: enoxaparin (LOVENOX) injection 40 mg  40 mg Subcutaneous Q24H Houston Siren, MD   40 mg at 06/02/11 0953  . DISCONTD: furosemide (LASIX) injection 40 mg  40 mg Intravenous Daily Houston Siren, MD   40 mg at 06/01/11 0930  . DISCONTD: moxifloxacin (AVELOX) IVPB 400 mg  400 mg  Intravenous QHS Houston Siren, MD   400 mg at 06/01/11 2117  . DISCONTD: vancomycin (VANCOCIN) 1,500 mg in sodium chloride 0.9 % 500 mL IVPB  1,500 mg Intravenous QHS Julian Crowford Darlina Guys., PHARMD      . DISCONTD: vancomycin (VANCOCIN) 2,000 mg in sodium chloride 0.9 % 500 mL IVPB  2,000 mg Intravenous Once Julian Crowford Darlina Guys., PHARMD      . DISCONTD: vancomycin (VANCOCIN) 2,000 mg in sodium chloride 0.9 % 500 mL IVPB  2,000 mg Intravenous Once Rollene Fare, PHARMD        PE: General appearance: alert, cooperative and no distress Neck: No JVD Lungs: Decreased BS greater on the right.  No rales or wheeze. Heart: regular rate and rhythm, S1, S2 normal, no murmur, click, rub or gallop Extremities: Trace LEE Pulses: 2+ and symmetric  Lab Results:   Basename 06/02/11 0935 06/01/11 0452 05/31/11 2056  WBC 17.6* 16.2* 17.2*  HGB 12.0* 11.4* 12.0*  HCT 37.0* 35.1* 37.4*  PLT 215 217 240   BMET  Basename 06/02/11 0449 06/01/11 0452 05/31/11 2056  NA 136 135 132*  K 3.5 3.8 4.0  CL 99 101 97  CO2 26 25 27   GLUCOSE 126* 89 80  BUN 19 13 12   CREATININE 1.55* 1.28 1.38*  CALCIUM 8.7 8.3* 9.0    Studies/Results: Study Conclusions  - Procedure narrative: Transthoracic echocardiography. Technically difficult study with suboptimal images. - Left ventricle: The cavity size was normal. There was moderate concentric hypertrophy. Systolic function was normal. The estimated ejection fraction was in the range of 60% to 65%. Wall motion was normal; there were no regional wall motion abnormalities. Doppler parameters are consistent with abnormal left ventricular relaxation (grade 1 diastolic dysfunction). The E/e' ratio is >10, suggesting elevated LV filling pressure. - Aortic valve: Moderately calcified. No definite aortic stenosis by doppler, however, leaflet motion is restricted. - Systemic veins: The IVC is not visualized. - Pericardium, extracardiac: There was no  pericardial effusion.  Assessment/Plan   Principal Problem:  *Shortness of breath Active Problems:  Back pain  Diastolic CHF, acute on chronic:  Grade one.  UTI (lower urinary tract infection)  DM (diabetes mellitus)  Hypothyroidism  HTN (hypertension)  COPD (chronic obstructive pulmonary disease)  CAD (coronary artery disease)  Obesity  Depression  Plan:  Blood cultures: Gram + Cocci Clusters.  Switched to [employer].  EF shows improvement from previous Cath which was 50%  Now EF 60-65% with grade one diastolic dysfunction.  Net fluids only -219 yesterday.  SCr increased from 1.25 to 1.55.  Lasix at 40mg  IV BID.  I think his problems are more related to COPD/UTI with a small CFH component.  HE does not appear to be diuresing much and urine looks concentrated.  No orthopnea or JVD.  No fever charted.  Will change Lasix to 20mg  PO BID and follow SCr.  Can probably switch to daily at DC.  Continue to avoid Salt.  His weight now is 266# and in our office in January it was 265 when he was asymptomatic.   Repat BNP in AM   LOS: 2 days    Antawan Mchugh W 06/02/2011 1:02 PM

## 2011-06-02 NOTE — Progress Notes (Signed)
Pt. Seen and examined. Agree with the NP/PA-C note as written.  EF 60-65%, mild diastolic dysfunction. Symptoms are more likely related to COPD rather than CHF. UTI with possible staph/strep. We may be over-diuresing him.  I agree with decreasing lasix to 20 mg BID. Agree to repeat BNP in the am.  Chrystie Nose, MD, [facility] Attending Cardiologist [facility]

## 2011-06-03 ENCOUNTER — Other Ambulatory Visit (HOSPITAL_COMMUNITY): Payer: Medicare Other

## 2011-06-03 LAB — CULTURE, BLOOD (ROUTINE X 2): Culture  Setup Time: 201304040127

## 2011-06-03 LAB — GLUCOSE, CAPILLARY
Glucose-Capillary: 159 mg/dL — ABNORMAL HIGH (ref 70–99)
Glucose-Capillary: 271 mg/dL — ABNORMAL HIGH (ref 70–99)
Glucose-Capillary: 148 mg/dL — ABNORMAL HIGH (ref 70–99)
Glucose-Capillary: 200 mg/dL — ABNORMAL HIGH (ref 70–99)

## 2011-06-03 LAB — PRO B NATRIURETIC PEPTIDE: Pro B Natriuretic peptide (BNP): 1271 pg/mL — ABNORMAL HIGH (ref 0–450)

## 2011-06-03 MED ORDER — MENTHOL 3 MG MT LOZG
1.0000 | LOZENGE | OROMUCOSAL | Status: DC | PRN
  Filled 2011-06-03: qty 9

## 2011-06-03 MED ORDER — LEVOTHYROXINE SODIUM 200 MCG PO TABS
200.0000 ug | ORAL_TABLET | Freq: Every day | ORAL | Status: DC
  Administered 2011-06-03: 200 ug via ORAL
  Filled 2011-06-03 (×2): qty 1

## 2011-06-03 NOTE — Progress Notes (Signed)
Subjective: Patient states that he feels much better today.Has no complaints.    Interval History: BC showed Coagulase negative staph. Objective: Filed Vitals:   06/03/11 0615 06/03/11 0700 06/03/11 1036 06/03/11 1458  BP: 139/68 131/87  137/67  Pulse: 63   65  Temp: 98.4 F (36.9 C) 98.3 F (36.8 C)  98.8 F (37.1 C)  TempSrc: Oral   Oral  Resp: 18   18  Height:      Weight:      SpO2: 95% 99% 92% 97%   Weight change: -0.052 kg (-1.9 oz)  Intake/Output Summary (Last 24 hours) at 06/03/11 1615 Last data filed at 06/03/11 0700  Gross per 24 hour  Intake     98 ml  Output    900 ml  Net   -802 ml    General: Alert, awake, oriented x3, in no acute distress. Looks well today. HEENT: [place]/AT PEERL, EOMI Neck: Trachea midline,  no masses, no thyromegal,y no JVD, no carotid bruit OROPHARYNX:  Moist, No exudate/ erythema/lesions.  Heart: Regular rate and rhythm, without murmurs, rubs, gallops, PMI non-displaced, no heaves or thrills on palpation.  Lungs: Clear to auscultation, no wheezing or rhonchi noted. No increased vocal fremitus resonant to percussion  Abdomen: Soft, nontender, nondistended, positive bowel sounds, no masses no hepatosplenomegaly noted..  Neuro: No focal neurological deficits noted  Musculoskeletal: No warm swelling or erythema around joints, no spinal tenderness noted.    Lab Results:  [facility] 06/02/11 0449 06/01/11 0452  NA 136 135  K 3.5 3.8  CL 99 101  CO2 26 25  GLUCOSE 126* 89  BUN 19 13  CREATININE 1.55* 1.28  CALCIUM 8.7 8.3*  MG -- --  PHOS -- --    Basename 05/31/11 2056  AST 23  ALT 10  ALKPHOS 94  BILITOT 1.0  PROT 7.3  ALBUMIN 3.2*   No results found for this basename: LIPASE:2,AMYLASE:2 in the last 72 hours  Basename 06/02/11 0935 06/01/11 0452 05/31/11 2056  WBC 17.6* 16.2* --  NEUTROABS 14.9* -- 15.3*  HGB 12.0* 11.4* --  HCT 37.0* 35.1* --  MCV 99.5 97.8 --  PLT 215 217 --   No results found for this basename:  CKTOTAL:3,CKMB:3,CKMBINDEX:3,TROPONINI:3 in the last 72 hours No components found with this basename: POCBNP:3 No results found for this basename: DDIMER:2 in the last 72 hours No results found for this basename: HGBA1C:2 in the last 72 hours No results found for this basename: CHOL:2,HDL:2,LDLCALC:2,TRIG:2,CHOLHDL:2,LDLDIRECT:2 in the last 72 hours  Basename 06/02/11 0449  TSH 6.991*  T4TOTAL --  T3FREE --  THYROIDAB --   No results found for this basename: VITAMINB12:2,FOLATE:2,FERRITIN:2,TIBC:2,IRON:2,RETICCTPCT:2 in the last 72 hours  Micro Results: Recent Results (from the past 240 hour(s))  CULTURE, BLOOD (ROUTINE X 2)     Status: Normal   Collection Time   05/31/11  8:50 PM      Component Value Range Status Comment   Specimen Description BLOOD RIGHT HAND   Final    Special Requests BOTTLES DRAWN AEROBIC ONLY   Final    Culture  Setup Time [id number]   Final    Culture     Final    Value: STAPHYLOCOCCUS SPECIES (COAGULASE NEGATIVE)     Note: THE SIGNIFICANCE OF ISOLATING THIS ORGANISM FROM A SINGLE SET OF BLOOD CULTURES WHEN MULTIPLE SETS ARE DRAWN IS UNCERTAIN. PLEASE NOTIFY THE MICROBIOLOGY DEPARTMENT WITHIN ONE WEEK IF SPECIATION AND SENSITIVITIES ARE REQUIRED.     Note: Gram Stain Report  Called to,Read Back By and Verified With: NEKA SHERARD @ 2308 ON 06/01/11 BY GOLLD   Report Status 06/03/2011 FINAL   Final   CULTURE, BLOOD (ROUTINE X 2)     Status: Normal (Preliminary result)   Collection Time   05/31/11  8:56 PM      Component Value Range Status Comment   Specimen Description BLOOD LEFT ANTECUBITAL   Final    Special Requests BOTTLES DRAWN AEROBIC AND ANAEROBIC 3CC   Final    Culture  Setup Time [id number]   Final    Culture     Final    Value:        BLOOD CULTURE RECEIVED NO GROWTH TO DATE CULTURE WILL BE HELD FOR 5 DAYS BEFORE ISSUING A FINAL NEGATIVE REPORT   Report Status PENDING   Incomplete   URINE CULTURE     Status: Normal   Collection Time   05/31/11  9:04  PM      Component Value Range Status Comment   Specimen Description URINE, RANDOM   Final    Special Requests NONE   Final    Culture  Setup Time [id number]   Final    Colony Count NO GROWTH   Final    Culture NO GROWTH   Final    Report Status 06/02/2011 FINAL   Final   MRSA PCR SCREENING     Status: Abnormal   Collection Time   06/02/11  3:09 PM      Component Value Range Status Comment   MRSA by PCR POSITIVE (*) NEGATIVE  Final     Studies/Results: Dg Chest Port 1 View  05/31/2011  *RADIOLOGY REPORT*  Clinical Data: Shortness of breath.  PORTABLE CHEST - 1 VIEW  Comparison: 09/01/2010.  Findings: The heart is enlarged but stable.  Stable surgical changes from bypass surgery.  There is vascular congestion and mild interstitial edema suggesting CHF.  No definite pleural effusions. Stable elevation of the right hemidiaphragm.  IMPRESSION: Cardiac enlargement with vascular congestion and mild interstitial edema.  Original Report Authenticated By: P. Loralie Champagne, M.D.    Medications: I have reviewed the patient's current medications. Scheduled Meds:    . allopurinol  150 mg Oral QHS  . aspirin EC  81 mg Oral Daily  . Chlorhexidine Gluconate Cloth  6 each Topical Q0600  . cholecalciferol  1,000 Units Oral BID  . clopidogrel  75 mg Oral QHS  . colesevelam  1,875 mg Oral QHS  . enoxaparin  60 mg Subcutaneous Q24H  . exenatide  10 mcg Subcutaneous BID WC  . furosemide  20 mg Oral BID  . gabapentin  300 mg Oral TID  . glipiZIDE  20 mg Oral Daily  . insulin aspart  0-20 Units Subcutaneous TID WC  . insulin aspart  0-5 Units Subcutaneous QHS  . insulin glargine  50 Units Subcutaneous QHS  . levofloxacin  750 mg Oral Daily  . levothyroxine  200 mcg Oral QHS  . linagliptin  5 mg Oral Daily  . metoprolol tartrate  25 mg Oral BID  . mupirocin ointment  1 application Nasal BID  . pantoprazole  40 mg Oral Q1200  . simvastatin  40 mg Oral QHS  . tiotropium  18 mcg Inhalation Daily  .  DISCONTD: levothyroxine  150 mcg Oral QHS  . DISCONTD: sodium chloride  3 mL Intravenous Q12H  . DISCONTD: vancomycin  1,500 mg Intravenous Q24H   Continuous Infusions:    . sodium chloride 20  mL (06/02/11 1121)   PRN Meds:.sodium chloride, sodium chloride, DISCONTD: sodium chloride Assessment/Plan: Patient Active Hospital Problem List: Shortness of breath (06/01/2011)   Assessment: Pt's SOB markedly improved. Per cardiology, pt back to his dry weight. Oxygen saturation back to normal at rest. Will check saturations during ambulation.     DM (diabetes mellitus) (06/01/2011)   Assessment: Blood sugars adequately controlled    Hypothyroidism (06/01/2011)   Assessment: Continue Synthroid. TSH slightly elevated. Will increase synthroid to 200 mcg.   HTN (hypertension) (06/01/2011)   Assessment: Blood pressure adequately controlled   COPD (chronic obstructive pulmonary disease) (06/01/2011)   Assessment: Patient has a history of COPD but no  significant component of this acute illness. We will continue albuterol treatments as needed no steroids indicated at this time    CAD (coronary artery disease) (06/01/2011)   Assessment: Continue Plavix and Toprol.     GPC Bacteremia: Coagulase negative staph. I wil D/C  Vancomycin.Await sensitivities.  LOS: 3 days

## 2011-06-03 NOTE — Progress Notes (Signed)
[facility]  DAILY PROGRESS NOTE   Subjective:  Breathing much improved. Weight gain 8 lb preceded dyspnea. No cough, fever or chills. No meaningful change in weight.  Objective:  Temp:  [98.3 F (36.8 C)-98.4 F (36.9 C)] 98.3 F (36.8 C) (04/06 0700) Pulse Rate:  [63-70] 63  (04/06 0615) Resp:  [18] 18  (04/06 0615) BP: (108-139)/(61-87) 131/87 mmHg (04/06 0700) SpO2:  [92 %-100 %] 92 % (04/06 1036) Weight:  [120.748 kg (266 lb 3.2 oz)] 120.748 kg (266 lb 3.2 oz) (04/06 0500) Weight change: -0.052 kg (-1.9 oz)  Intake/Output from previous day: 04/05 0701 - 04/06 0700 In: 1241 [P.O.:290; I.V.:151; IV Piggyback:800] Out: 1550 [Urine:1550]  Intake/Output from this shift:    Medications: Current Facility-Administered Medications  Medication Dose Route Frequency Provider Last Rate Last Dose  . 0.9 %  sodium chloride infusion  250 mL Intravenous PRN Houston Siren, MD      . 0.9 %  sodium chloride infusion   Intravenous Continuous Osvaldo Shipper, MD 20 mL/hr at 06/02/11 1121 20 mL at 06/02/11 1121  . allopurinol (ZYLOPRIM) tablet 150 mg  150 mg Oral QHS Houston Siren, MD   150 mg at 06/02/11 2104  . aspirin EC tablet 81 mg  81 mg Oral Daily Houston Siren, MD   81 mg at 06/03/11 0940  . Chlorhexidine Gluconate Cloth 2 % PADS 6 each  6 each Topical Q0600 Osvaldo Shipper, MD   6 each at 06/03/11 0600  . cholecalciferol (VITAMIN D) tablet 1,000 Units  1,000 Units Oral BID Houston Siren, MD   1,000 Units at 06/03/11 0941  . clopidogrel (PLAVIX) tablet 75 mg  75 mg Oral QHS Houston Siren, MD   75 mg at 06/02/11 2104  . colesevelam [facility]) tablet 1,875 mg  1,875 mg Oral QHS Houston Siren, MD   1,875 mg at 06/02/11 2104  . enoxaparin (LOVENOX) injection 60 mg  60 mg Subcutaneous Q24H Rollene Fare, PHARMD   60 mg at 06/03/11 0916  . exenatide (BYETTA) injection SOLN 10 mcg  10 mcg Subcutaneous BID WC Houston Siren, MD      . furosemide (LASIX) tablet 20 mg  20 mg Oral BID Altha Harm, MD   20 mg at 06/03/11 0915  . gabapentin (NEURONTIN) capsule 300 mg  300 mg Oral TID Houston Siren, MD   300 mg at 06/03/11 0941  . glipiZIDE (GLUCOTROL) tablet 20 mg  20 mg Oral Daily Houston Siren, MD   20 mg at 06/03/11 0916  . insulin aspart (novoLOG) injection 0-20 Units  0-20 Units Subcutaneous TID WC Houston Siren, MD   11 Units at 06/03/11 1202  . insulin aspart (novoLOG) injection 0-5 Units  0-5 Units Subcutaneous QHS Houston Siren, MD   2 Units at 06/02/11 2114  . insulin glargine (LANTUS) injection 50 Units  50 Units Subcutaneous QHS Houston Siren, MD   50 Units at 06/02/11 2114  . levofloxacin (LEVAQUIN) tablet 750 mg  750 mg Oral Daily Altha Harm, MD      . levothyroxine (SYNTHROID, LEVOTHROID) tablet 200 mcg  200 mcg Oral QHS Altha Harm, MD      . linagliptin (TRADJENTA) tablet 5 mg  5 mg Oral Daily Houston Siren, MD   5 mg at 06/03/11 0940  . metoprolol tartrate (LOPRESSOR) tablet 25 mg  25 mg Oral BID Houston Siren, MD   25 mg at 06/03/11 0940  . mupirocin ointment (BACTROBAN) 2 %  1 application  1 application Nasal BID Osvaldo Shipper, MD   1 application at 06/02/11 2122  . pantoprazole (PROTONIX) EC tablet 40 mg  40 mg Oral Q1200 Houston Siren, MD   40 mg at 06/03/11 1201  . simvastatin (ZOCOR) tablet 40 mg  40 mg Oral QHS Houston Siren, MD   40 mg at 06/02/11 2105  . sodium chloride 0.9 % injection 10-40 mL  10-40 mL Intracatheter PRN Osvaldo Shipper, MD   10 mL at 06/03/11 0402  . tiotropium (SPIRIVA) inhalation capsule 18 mcg  18 mcg Inhalation Daily Houston Siren, MD   18 mcg at 06/03/11 1036  . vancomycin (VANCOCIN) 2,000 mg in sodium chloride 0.9 % 500 mL IVPB  2,000 mg Intravenous Once Rollene Fare, PHARMD   2,000 mg at 06/02/11 1325  . DISCONTD: furosemide (LASIX) tablet 20 mg  20 mg Oral Custom Osvaldo Shipper, MD      . DISCONTD: furosemide (LASIX) tablet 40 mg  40 mg Oral Q M,W,F Altha Harm, MD      . DISCONTD: levothyroxine (SYNTHROID, LEVOTHROID) tablet 150 mcg  150 mcg Oral QHS  Houston Siren, MD   150 mcg at 06/02/11 2105  . DISCONTD: sodium chloride 0.9 % injection 3 mL  3 mL Intravenous Q12H Houston Siren, MD   3 mL at 06/01/11 2313  . DISCONTD: sodium chloride 0.9 % injection 3 mL  3 mL Intravenous PRN Houston Siren, MD      . DISCONTD: vancomycin (VANCOCIN) 1,500 mg in sodium chloride 0.9 % 500 mL IVPB  1,500 mg Intravenous Q24H Rollene Fare, PHARMD   1,500 mg at 06/03/11 1201    Physical Exam: General appearance: alert, cooperative and no distress Neck: no adenopathy, no carotid bruit, supple, symmetrical, trachea midline, thyroid not enlarged, symmetric, no tenderness/mass/nodules and can't see JVP due to obesity Lungs: diminished breath sounds bilaterally Heart: regular rate and rhythm, S1, S2 normal, no murmur, click, rub or gallop and impulse: laterally displaced Abdomen: soft, non-tender; bowel sounds normal; no masses,  no organomegaly Extremities: edema 2+ 1/2 way up shins symetrically Neurologic: Alert and oriented X 3, normal strength and tone. Normal symmetric reflexes. Normal coordination and gait  Lab Results: Results for orders placed during the hospital encounter of 05/31/11 (from the past 48 hour(s))  GLUCOSE, CAPILLARY     Status: Abnormal   Collection Time   06/01/11  4:50 PM      Component Value Range Comment   Glucose-Capillary 136 (*) 70 - 99 (mg/dL)    Comment 1 Notify RN     GLUCOSE, CAPILLARY     Status: Abnormal   Collection Time   06/01/11  9:26 PM      Component Value Range Comment   Glucose-Capillary 162 (*) 70 - 99 (mg/dL)   BASIC METABOLIC PANEL     Status: Abnormal   Collection Time   06/02/11  4:49 AM      Component Value Range Comment   Sodium 136  135 - 145 (mEq/L)    Potassium 3.5  3.5 - 5.1 (mEq/L)    Chloride 99  96 - 112 (mEq/L)    CO2 26  19 - 32 (mEq/L)    Glucose, Bld 126 (*) 70 - 99 (mg/dL)    BUN 19  6 - 23 (mg/dL)    Creatinine, Ser 9.81 (*) 0.50 - 1.35 (mg/dL)    Calcium 8.7  8.4 - 10.5 (mg/dL)    GFR calc non Af [place]se Dago  42 (*) >90 (mL/min)    GFR calc Af Amer 49 (*) >90 (mL/min)   TSH     Status: Abnormal   Collection Time   06/02/11  4:49 AM      Component Value Range Comment   TSH 6.991 (*) 0.350 - 4.500 (uIU/mL)   GLUCOSE, CAPILLARY     Status: Abnormal   Collection Time   06/02/11  8:16 AM      Component Value Range Comment   Glucose-Capillary 136 (*) 70 - 99 (mg/dL)    Comment 1 Notify RN     CBC     Status: Abnormal   Collection Time   06/02/11  9:35 AM      Component Value Range Comment   WBC 17.6 (*) 4.0 - 10.5 (K/uL)    RBC 3.72 (*) 4.22 - 5.81 (MIL/uL)    Hemoglobin 12.0 (*) 13.0 - 17.0 (g/dL)    HCT 16.1 (*) 09.6 - 52.0 (%)    MCV 99.5  78.0 - 100.0 (fL)    MCH 32.3  26.0 - 34.0 (pg)    MCHC 32.4  30.0 - 36.0 (g/dL)    RDW 04.5 (*) 40.9 - 15.5 (%)    Platelets 215  150 - 400 (K/uL)   DIFFERENTIAL     Status: Abnormal   Collection Time   06/02/11  9:35 AM      Component Value Range Comment   Neutrophils Relative 85 (*) 43 - 77 (%)    Lymphocytes Relative 4 (*) 12 - 46 (%)    Monocytes Relative 10  3 - 12 (%)    Eosinophils Relative 1  0 - 5 (%)    Basophils Relative 0  0 - 1 (%)    Neutro Abs 14.9 (*) 1.7 - 7.7 (K/uL)    Lymphs Abs 0.7  0.7 - 4.0 (K/uL)    Monocytes Absolute 1.8 (*) 0.1 - 1.0 (K/uL)    Eosinophils Absolute 0.2  0.0 - 0.7 (K/uL)    Basophils Absolute 0.0  0.0 - 0.1 (K/uL)    WBC Morphology MILD LEFT SHIFT (1-5% METAS, OCC MYELO, OCC BANDS)   TOXIC GRANULATION  GLUCOSE, CAPILLARY     Status: Abnormal   Collection Time   06/02/11 12:14 PM      Component Value Range Comment   Glucose-Capillary 293 (*) 70 - 99 (mg/dL)    Comment 1 Notify RN     MRSA PCR SCREENING     Status: Abnormal   Collection Time   06/02/11  3:09 PM      Component Value Range Comment   MRSA by PCR POSITIVE (*) NEGATIVE    GLUCOSE, CAPILLARY     Status: Abnormal   Collection Time   06/02/11  5:26 PM      Component Value Range Comment   Glucose-Capillary 110 (*) 70 - 99 (mg/dL)    Comment 1 Notify  RN     GLUCOSE, CAPILLARY     Status: Abnormal   Collection Time   06/02/11  9:13 PM      Component Value Range Comment   Glucose-Capillary 201 (*) 70 - 99 (mg/dL)   PRO B NATRIURETIC PEPTIDE     Status: Abnormal   Collection Time   06/03/11  4:00 AM      Component Value Range Comment   Pro B Natriuretic peptide (BNP) 1271.0 (*) 0 - 450 (pg/mL)   GLUCOSE, CAPILLARY     Status: Abnormal   Collection Time  06/03/11  7:54 AM      Component Value Range Comment   Glucose-Capillary 159 (*) 70 - 99 (mg/dL)    Comment 1 Notify RN     GLUCOSE, CAPILLARY     Status: Abnormal   Collection Time   06/03/11 11:02 AM      Component Value Range Comment   Glucose-Capillary 271 (*) 70 - 99 (mg/dL)     Imaging: No results found.  Assessment:  1. Principal Problem: 2.  *Shortness of breath 3. Active Problems: 4.  Back pain 5.  Diastolic CHF, acute on chronic:  Grade one. 6.  UTI (lower urinary tract infection) 7.  DM (diabetes mellitus) 8.  Hypothyroidism 9.  HTN (hypertension) 10.  COPD (chronic obstructive pulmonary disease) 11.  CAD (coronary artery disease) 12.  Obesity 13.  Depression 14.   Plan:  1. Not clear cut, but I suspect CHF rather than COPD in view of rapid improvement, weight gain, edema and CXR findings. BNP mildly elevated (unfortunately no proBNP baseline).  If DC'd tomorrow, will follow up in our office on Thursday or Friday (this is already being set up). Please dc on minimum 40 mg daily furosemide. He still is hypervolemic.  Time Spent Directly with Patient:  20 minutes  Length of Stay:  LOS: 3 days    Roberta Kelly 06/03/2011, 2:55 PM

## 2011-06-04 LAB — GLUCOSE, CAPILLARY: Glucose-Capillary: 257 mg/dL — ABNORMAL HIGH (ref 70–99)

## 2011-06-04 LAB — DIFFERENTIAL
Basophils Absolute: 0 10*3/uL (ref 0.0–0.1)
Basophils Relative: 1 % (ref 0–1)
Eosinophils Absolute: 0.6 10*3/uL (ref 0.0–0.7)
Neutro Abs: 3.8 10*3/uL (ref 1.7–7.7)
Neutrophils Relative %: 53 % (ref 43–77)

## 2011-06-04 LAB — BASIC METABOLIC PANEL
Chloride: 101 mEq/L (ref 96–112)
Creatinine, Ser: 1.64 mg/dL — ABNORMAL HIGH (ref 0.50–1.35)
GFR calc Af Amer: 46 mL/min — ABNORMAL LOW (ref >90)
GFR calc non Af Amer: 39 mL/min — ABNORMAL LOW (ref >90)
Potassium: 3.8 mEq/L (ref 3.5–5.1)

## 2011-06-04 LAB — CBC
MCHC: 32 g/dL (ref 30.0–36.0)
RDW: 15.4 % (ref 11.5–15.5)

## 2011-06-04 MED ORDER — LEVOFLOXACIN 750 MG PO TABS: 750.0000 mg | ORAL_TABLET | Freq: Every day | ORAL | Status: DC

## 2011-06-04 MED ORDER — FUROSEMIDE 20 MG PO TABS: 20.0000 mg | ORAL_TABLET | Freq: Two times a day (BID) | ORAL | Status: DC

## 2011-06-04 NOTE — Discharge Summary (Signed)
[name] MRN: [id number] DOB/AGE: 1933/10/04 78 y.o.  Admit date: 05/31/2011 Discharge date: 06/04/2011  Primary Care Physician:  [name], MD, MD   Discharge Diagnoses:   Patient Active Problem List  Diagnoses  . Back pain  . Leg weakness, bilateral  . Stiffness of joints, not elsewhere classified, multiple sites  . Shortness of breath  . Diastolic CHF, acute on chronic:  Grade one.  Marland Kitchen UTI (lower urinary tract infection)  . DM (diabetes mellitus)  . Hypothyroidism  . HTN (hypertension)  . COPD (chronic obstructive pulmonary disease)  . CAD (coronary artery disease)  . Obesity  . Depression    DISCHARGE MEDICATION: Medication List  As of 06/04/2011  7:45 PM   STOP taking these medications         nitrofurantoin (macrocrystal-monohydrate) 100 MG capsule         TAKE these medications         allopurinol 300 MG tablet   Commonly known as: ZYLOPRIM   Take 150 mg by mouth at bedtime.      aspirin EC 81 MG tablet   Take 81 mg by mouth daily.      BYETTA 10 MCG PEN [place]   Inject 10 Units into the skin 2 (two) times daily.      clopidogrel 75 MG tablet   Commonly known as: PLAVIX   Take 75 mg by mouth at bedtime. Stopped on 1-30      colesevelam 625 MG tablet   Commonly known as: WELCHOL   Take 1,875 mg by mouth at bedtime.      furosemide 20 MG tablet   Commonly known as: LASIX   Take 1 tablet (20 mg total) by mouth 2 (two) times daily.      gabapentin 300 MG capsule   Commonly known as: NEURONTIN   Take 300 mg by mouth 3 (three) times daily.      glipiZIDE 10 MG tablet   Commonly known as: GLUCOTROL   Take 20 mg by mouth daily.      HYDROcodone-acetaminophen 10-500 MG per tablet   Commonly known as: LORTAB   Take 1 tablet by mouth every 4 (four) hours as needed. For pain      insulin glargine 100 UNIT/ML injection   Commonly known as: LANTUS   Inject 50 Units into the skin at bedtime.      levofloxacin 750 MG tablet   Commonly known as:  LEVAQUIN   Take 1 tablet (750 mg total) by mouth daily.      levothyroxine 150 MCG tablet   Commonly known as: SYNTHROID, LEVOTHROID   Take 150 mcg by mouth at bedtime.      metoprolol tartrate 25 MG tablet   Commonly known as: LOPRESSOR   Take 25 mg by mouth 2 (two) times daily.      omeprazole 20 MG capsule   Commonly known as: PRILOSEC   Take 20 mg by mouth at bedtime.      simvastatin 40 MG tablet   Commonly known as: ZOCOR   Take 40 mg by mouth at bedtime.      [name] 100 MG tablet   Commonly known as: JANUVIA   Take 100 mg by mouth 2 (two) times daily.      SPIRIVA HANDIHALER IN   Inhale 1-2 capsules into the lungs daily. Twice a day if needed for breathing      Vitamin D3 1000 UNITS Caps   Take 1 capsule by mouth  2 (two) times daily.              Consults:     SIGNIFICANT DIAGNOSTIC STUDIES:  Dg Chest Port 1 View  05/31/2011  *RADIOLOGY REPORT*  Clinical Data: Shortness of breath.  PORTABLE CHEST - 1 VIEW  Comparison: 09/01/2010.  Findings: The heart is enlarged but stable.  Stable surgical changes from bypass surgery.  There is vascular congestion and mild interstitial edema suggesting CHF.  No definite pleural effusions. Stable elevation of the right hemidiaphragm.  IMPRESSION: Cardiac enlargement with vascular congestion and mild interstitial edema.  Original Report Authenticated By: P. Loralie Champagne, M.D.    ECHO: - Procedure narrative: Transthoracic echocardiography. Technically difficult study with suboptimal images. - Left ventricle: The cavity size was normal. There was moderate concentric hypertrophy. Systolic function was normal. The estimated ejection fraction was in the range of 60% to 65%. Wall motion was normal; there were no regional wall motion abnormalities. Doppler parameters are consistent with abnormal left ventricular relaxation (grade 1 diastolic dysfunction). The E/e' ratio is >10, suggesting elevated LV filling pressure. -  Aortic valve: Moderately calcified. No definite aortic stenosis by doppler, however, leaflet motion is restricted. - Systemic veins: The IVC is not visualized. - Pericardium, extracardiac: There was no pericardial effusion.         Recent Results (from the past 240 hour(s))  CULTURE, BLOOD (ROUTINE X 2)     Status: Normal   Collection Time   05/31/11  8:50 PM      Component Value Range Status Comment   Specimen Description BLOOD RIGHT HAND   Final    Special Requests BOTTLES DRAWN AEROBIC ONLY   Final    Culture  Setup Time [id number]   Final    Culture     Final    Value: STAPHYLOCOCCUS SPECIES (COAGULASE NEGATIVE)     Note: THE SIGNIFICANCE OF ISOLATING THIS ORGANISM FROM A SINGLE SET OF BLOOD CULTURES WHEN MULTIPLE SETS ARE DRAWN IS UNCERTAIN. PLEASE NOTIFY THE MICROBIOLOGY DEPARTMENT WITHIN ONE WEEK IF SPECIATION AND SENSITIVITIES ARE REQUIRED.     Note: Gram Stain Report Called to,Read Back By and Verified With: NEKA SHERARD @ 2308 ON 06/01/11 BY GOLLD   Report Status 06/03/2011 FINAL   Final   CULTURE, BLOOD (ROUTINE X 2)     Status: Normal (Preliminary result)   Collection Time   05/31/11  8:56 PM      Component Value Range Status Comment   Specimen Description BLOOD LEFT ANTECUBITAL   Final    Special Requests BOTTLES DRAWN AEROBIC AND ANAEROBIC 3CC   Final    Culture  Setup Time [id number]   Final    Culture     Final    Value:        BLOOD CULTURE RECEIVED NO GROWTH TO DATE CULTURE WILL BE HELD FOR 5 DAYS BEFORE ISSUING A FINAL NEGATIVE REPORT   Report Status PENDING   Incomplete   URINE CULTURE     Status: Normal   Collection Time   05/31/11  9:04 PM      Component Value Range Status Comment   Specimen Description URINE, RANDOM   Final    Special Requests NONE   Final    Culture  Setup Time [id number]   Final    Colony Count NO GROWTH   Final    Culture NO GROWTH   Final    Report Status 06/02/2011 FINAL   Final   MRSA PCR SCREENING  Status: Abnormal    Collection Time   06/02/11  3:09 PM      Component Value Range Status Comment   MRSA by PCR POSITIVE (*) NEGATIVE  Final     BRIEF ADMITTING H & P: [name] is an 78 y.o. male with history of hypertension, coronary disease, congestive heart failure, COPD, sleep apnea, type 2 diabetes presents to the emergency room for progressive increase shortness of breath for the past 3 days. He denied any chest pain, fever, chills, but admitted to having white sputum productive cough. He stated he has gained 8 pounds over the past few days since eating ham for Easter. He denied orthopnea or PND. Evaluation in emergency room included a chest x-ray which shows vascular congestion but no definite infiltrate, leukocytosis with white count 17,000 creatinine of 1.4, BNP of 1252, and negative lactic acid and Procalcitonin. Hospitalist was asked to admit him for UTI and possible early pneumonia. I think he has volume overload as well.    Hospital Course:  Present on Admission:  .Shortness of breath: Pt was admitted with acute on chronic diastolic heart failure. He was placed on aggressive diuresis and his cardiologists were consulted and lead the management of his CHF care. I spoke with Cardiology who felt to clinically although he was not at his dry weight, he could be discharged home on a total of 40 mg of Lasix daily. He will follow up with Cardiology on Thursday or Friday which is already being set up by Cardiology.  .Diastolic CHF, acute on chronic:  Grade one.: See above  .UTI (lower urinary tract infection): Urine culture showed no growth thus antibiotics were discontinued.  .DM (diabetes mellitus): Blood sugars well controlled during this hospitalization. Review of HbA1c from PMD in last 3 months shows good control.  .Hypothyroidism: TSH mildly elevated Synthroid increased to 200 mcg daily  .HTN (hypertension): BP copntrolled  .COPD (chronic obstructive pulmonary disease): Quiescent  .Coagulase  negative Staph bacteremia: Felt to most likely be a contaminant. Patient is stable. Recommend repeat B/C if develops fever in the near future.    Disposition and Follow-up:  Pt to follow up with Dr. Juanetta Gosling in 1 week, and with Dr, Allyson Sabal as per office and Dr. Patsi Sears on 4/10 2013.  Discharge Orders    Future Orders Please Complete By Expires   Diet Carb Modified      Comments:   Heart Healthy   Increase activity slowly         DISCHARGE EXAM:  General: Alert, awake, oriented x3, in no acute distress. Looks well today.  Vital Signs: Blood pressure 155/75, pulse 58, temperature 97.7 F (36.5 C), temperature source Oral, resp. rate 18, height 5\' 11"  (1.803 m), weight 121.8 kg (268 lb 8.3 oz), SpO2 96.00%. HEENT: [name]/AT PEERL, EOMI  Neck: Trachea midline, no masses, no thyromegal,y no JVD, no carotid bruit  OROPHARYNX: Moist, No exudate/ erythema/lesions.  Heart: Regular rate and rhythm, without murmurs, rubs, gallops, PMI non-displaced, no heaves or thrills on palpation.  Lungs: Clear to auscultation, no wheezing or rhonchi noted. No increased vocal fremitus resonant to percussion  Abdomen: Soft, nontender, nondistended, positive bowel sounds, no masses no hepatosplenomegaly noted..  Neuro: No focal neurological deficits noted  Musculoskeletal: No warm swelling or erythema around joints, no spinal tenderness noted.     Basename 06/04/11 0520 06/02/11 0449  NA 137 136  K 3.8 3.5  CL 101 99  CO2 28 26  GLUCOSE 127* 126*  BUN 25*  19  CREATININE 1.64* 1.55*  CALCIUM 9.1 8.7  MG -- --  PHOS -- --   No results found for this basename: AST:2,ALT:2,ALKPHOS:2,BILITOT:2,PROT:2,ALBUMIN:2 in the last 72 hours No results found for this basename: LIPASE:2,AMYLASE:2 in the last 72 hours  Basename 06/04/11 0520 06/02/11 0935  WBC 7.2 17.6*  NEUTROABS 3.8 14.9*  HGB 10.8* 12.0*  HCT 33.7* 37.0*  MCV 98.5 99.5  PLT 216 215   Total time for D/C process including face to face time is  approximately 50 mniutes  Signed: MATTHEWS,MICHELLE A. 06/04/2011, 7:45 PM

## 2011-06-05 ENCOUNTER — Other Ambulatory Visit: Payer: Self-pay | Admitting: Orthopedic Surgery

## 2011-06-09 ENCOUNTER — Emergency Department (HOSPITAL_COMMUNITY): Payer: Medicare Other

## 2011-06-09 ENCOUNTER — Inpatient Hospital Stay (HOSPITAL_COMMUNITY)
Admission: EM | Admit: 2011-06-09 | Discharge: 2011-06-12 | DRG: 291 | Disposition: A | Discharge status: Home Health | Payer: Medicare Other | Source: Ambulatory Visit | Attending: Internal Medicine | Admitting: Internal Medicine

## 2011-06-09 ENCOUNTER — Encounter (HOSPITAL_COMMUNITY): Payer: Self-pay

## 2011-06-09 DIAGNOSIS — R29898 Other symptoms and signs involving the musculoskeletal system: Secondary | ICD-10-CM

## 2011-06-09 DIAGNOSIS — J449 Chronic obstructive pulmonary disease, unspecified: Secondary | ICD-10-CM | POA: Diagnosis present

## 2011-06-09 DIAGNOSIS — I252 Old myocardial infarction: Secondary | ICD-10-CM

## 2011-06-09 DIAGNOSIS — I5032 Chronic diastolic (congestive) heart failure: Secondary | ICD-10-CM | POA: Diagnosis present

## 2011-06-09 DIAGNOSIS — G4730 Sleep apnea, unspecified: Secondary | ICD-10-CM | POA: Diagnosis present

## 2011-06-09 DIAGNOSIS — E119 Type 2 diabetes mellitus without complications: Secondary | ICD-10-CM | POA: Diagnosis present

## 2011-06-09 DIAGNOSIS — I5033 Acute on chronic diastolic (congestive) heart failure: Secondary | ICD-10-CM

## 2011-06-09 DIAGNOSIS — G4733 Obstructive sleep apnea (adult) (pediatric): Secondary | ICD-10-CM | POA: Diagnosis present

## 2011-06-09 DIAGNOSIS — J189 Pneumonia, unspecified organism: Secondary | ICD-10-CM | POA: Diagnosis present

## 2011-06-09 DIAGNOSIS — E669 Obesity, unspecified: Secondary | ICD-10-CM | POA: Diagnosis present

## 2011-06-09 DIAGNOSIS — Z7902 Long term (current) use of antithrombotics/antiplatelets: Secondary | ICD-10-CM

## 2011-06-09 DIAGNOSIS — I739 Peripheral vascular disease, unspecified: Secondary | ICD-10-CM | POA: Diagnosis present

## 2011-06-09 DIAGNOSIS — J4489 Other specified chronic obstructive pulmonary disease: Secondary | ICD-10-CM | POA: Diagnosis present

## 2011-06-09 DIAGNOSIS — M2560 Stiffness of unspecified joint, not elsewhere classified: Secondary | ICD-10-CM

## 2011-06-09 DIAGNOSIS — E662 Morbid (severe) obesity with alveolar hypoventilation: Secondary | ICD-10-CM | POA: Diagnosis present

## 2011-06-09 DIAGNOSIS — M549 Dorsalgia, unspecified: Secondary | ICD-10-CM | POA: Diagnosis present

## 2011-06-09 DIAGNOSIS — N390 Urinary tract infection, site not specified: Secondary | ICD-10-CM

## 2011-06-09 DIAGNOSIS — F329 Major depressive disorder, single episode, unspecified: Secondary | ICD-10-CM

## 2011-06-09 DIAGNOSIS — R0600 Dyspnea, unspecified: Secondary | ICD-10-CM | POA: Diagnosis present

## 2011-06-09 DIAGNOSIS — I2510 Atherosclerotic heart disease of native coronary artery without angina pectoris: Secondary | ICD-10-CM | POA: Diagnosis present

## 2011-06-09 DIAGNOSIS — I10 Essential (primary) hypertension: Secondary | ICD-10-CM | POA: Diagnosis present

## 2011-06-09 DIAGNOSIS — E039 Hypothyroidism, unspecified: Secondary | ICD-10-CM | POA: Diagnosis present

## 2011-06-09 DIAGNOSIS — N189 Chronic kidney disease, unspecified: Secondary | ICD-10-CM

## 2011-06-09 DIAGNOSIS — Z951 Presence of aortocoronary bypass graft: Secondary | ICD-10-CM

## 2011-06-09 DIAGNOSIS — I129 Hypertensive chronic kidney disease with stage 1 through stage 4 chronic kidney disease, or unspecified chronic kidney disease: Secondary | ICD-10-CM | POA: Diagnosis present

## 2011-06-09 DIAGNOSIS — I509 Heart failure, unspecified: Secondary | ICD-10-CM | POA: Diagnosis present

## 2011-06-09 DIAGNOSIS — R5381 Other malaise: Secondary | ICD-10-CM | POA: Diagnosis present

## 2011-06-09 DIAGNOSIS — Z7982 Long term (current) use of aspirin: Secondary | ICD-10-CM

## 2011-06-09 LAB — CARDIAC PANEL(CRET KIN+CKTOT+MB+TROPI)
Relative Index: INVALID (ref 0.0–2.5)
Relative Index: INVALID (ref 0.0–2.5)
Total CK: 43 U/L (ref 7–232)
Total CK: 43 U/L (ref 7–232)
Total CK: 39 U/L (ref 7–232)
Troponin I: <0.3 ng/mL (ref <0.30)

## 2011-06-09 LAB — PRO B NATRIURETIC PEPTIDE: Pro B Natriuretic peptide (BNP): 486.4 pg/mL — ABNORMAL HIGH (ref 0–450)

## 2011-06-09 LAB — CBC
MCH: 32.3 pg (ref 26.0–34.0)
MCHC: 33.5 g/dL (ref 30.0–36.0)
MCV: 97.7 fL (ref 78.0–100.0)
Platelets: 175 10*3/uL (ref 150–400)
Platelets: 196 10*3/uL (ref 150–400)
RBC: 3.86 MIL/uL — ABNORMAL LOW (ref 4.22–5.81)
RDW: 15.2 % (ref 11.5–15.5)
RDW: 15.4 % (ref 11.5–15.5)
WBC: 18.3 10*3/uL — ABNORMAL HIGH (ref 4.0–10.5)

## 2011-06-09 LAB — COMPREHENSIVE METABOLIC PANEL
AST: 29 U/L (ref 0–37)
CO2: 23 mEq/L (ref 19–32)
Calcium: 9.4 mg/dL (ref 8.4–10.5)
Creatinine, Ser: 1.78 mg/dL — ABNORMAL HIGH (ref 0.50–1.35)
GFR calc non Af Amer: 36 mL/min — ABNORMAL LOW (ref >90)
Total Protein: 7.7 g/dL (ref 6.0–8.3)

## 2011-06-09 LAB — URINALYSIS, ROUTINE W REFLEX MICROSCOPIC
Ketones, ur: NEGATIVE mg/dL
Nitrite: NEGATIVE
Protein, ur: 30 mg/dL — AB
Urobilinogen, UA: 0.2 mg/dL (ref 0.0–1.0)

## 2011-06-09 LAB — GLUCOSE, CAPILLARY
Glucose-Capillary: 231 mg/dL — ABNORMAL HIGH (ref 70–99)
Glucose-Capillary: 209 mg/dL — ABNORMAL HIGH (ref 70–99)
Glucose-Capillary: 134 mg/dL — ABNORMAL HIGH (ref 70–99)

## 2011-06-09 LAB — HEMOGLOBIN A1C
Hgb A1c MFr Bld: 6.9 % — ABNORMAL HIGH (ref <5.7)
Mean Plasma Glucose: 151 mg/dL — ABNORMAL HIGH (ref <117)

## 2011-06-09 LAB — POCT I-STAT TROPONIN I: Troponin i, poc: 0 ng/mL (ref 0.00–0.08)

## 2011-06-09 LAB — CREATININE, SERUM
Creatinine, Ser: 2.03 mg/dL — ABNORMAL HIGH (ref 0.50–1.35)
GFR calc Af Amer: 35 mL/min — ABNORMAL LOW (ref >90)
GFR calc non Af Amer: 30 mL/min — ABNORMAL LOW (ref >90)

## 2011-06-09 LAB — URINE MICROSCOPIC-ADD ON

## 2011-06-09 MED ORDER — VITAMIN D3 25 MCG (1000 UT) PO CAPS: 1.0000 | ORAL_CAPSULE | Freq: Two times a day (BID) | ORAL | Status: DC

## 2011-06-09 MED ORDER — GUAIFENESIN 100 MG/5ML PO SOLN
10.0000 mL | ORAL | Status: DC | PRN
  Filled 2011-06-09: qty 10

## 2011-06-09 MED ORDER — ACETAMINOPHEN 325 MG PO TABS: 650.0000 mg | ORAL_TABLET | ORAL | Status: DC | PRN

## 2011-06-09 MED ORDER — VANCOMYCIN HCL 1000 MG IV SOLR
2000.0000 mg | INTRAVENOUS | Status: AC
  Administered 2011-06-09: 2000 mg via INTRAVENOUS
  Filled 2011-06-09: qty 2000

## 2011-06-09 MED ORDER — SIMVASTATIN 40 MG PO TABS
40.0000 mg | ORAL_TABLET | Freq: Every day | ORAL | Status: DC
  Administered 2011-06-09 – 2011-06-11 (×3): 40 mg via ORAL
  Filled 2011-06-09 (×4): qty 1

## 2011-06-09 MED ORDER — ENOXAPARIN SODIUM 40 MG/0.4ML ~~LOC~~ SOLN
40.0000 mg | SUBCUTANEOUS | Status: DC
  Administered 2011-06-09 – 2011-06-12 (×4): 40 mg via SUBCUTANEOUS
  Filled 2011-06-09 (×4): qty 0.4

## 2011-06-09 MED ORDER — COLESEVELAM HCL 625 MG PO TABS
1875.0000 mg | ORAL_TABLET | Freq: Every day | ORAL | Status: DC
  Administered 2011-06-09 – 2011-06-11 (×3): 1875 mg via ORAL
  Filled 2011-06-09 (×4): qty 3

## 2011-06-09 MED ORDER — LINAGLIPTIN 5 MG PO TABS
5.0000 mg | ORAL_TABLET | Freq: Every day | ORAL | Status: DC
  Administered 2011-06-09 – 2011-06-12 (×4): 5 mg via ORAL
  Filled 2011-06-09 (×4): qty 1

## 2011-06-09 MED ORDER — ALLOPURINOL 150 MG HALF TABLET
150.0000 mg | ORAL_TABLET | Freq: Every day | ORAL | Status: DC
Start: 2011-06-09 — End: 2011-06-12
  Administered 2011-06-09 – 2011-06-11 (×3): 150 mg via ORAL
  Filled 2011-06-09 (×4): qty 1

## 2011-06-09 MED ORDER — GUAIFENESIN 100 MG/5ML PO SYRP
200.0000 mg | ORAL_SOLUTION | ORAL | Status: DC | PRN
  Filled 2011-06-09: qty 118

## 2011-06-09 MED ORDER — DEXTROSE 5 % IV SOLN
1.0000 g | Freq: Once | INTRAVENOUS | Status: AC
  Administered 2011-06-09: 1 g via INTRAVENOUS
  Filled 2011-06-09: qty 1

## 2011-06-09 MED ORDER — BENZONATATE 100 MG PO CAPS
100.0000 mg | ORAL_CAPSULE | Freq: Three times a day (TID) | ORAL | Status: DC
  Administered 2011-06-09 – 2011-06-12 (×10): 100 mg via ORAL
  Filled 2011-06-09 (×12): qty 1

## 2011-06-09 MED ORDER — SODIUM CHLORIDE 0.9 % IJ SOLN
3.0000 mL | Freq: Two times a day (BID) | INTRAMUSCULAR | Status: DC
  Administered 2011-06-09 – 2011-06-12 (×6): 3 mL via INTRAVENOUS

## 2011-06-09 MED ORDER — ALBUTEROL SULFATE (5 MG/ML) 0.5% IN NEBU
2.5000 mg | INHALATION_SOLUTION | Freq: Four times a day (QID) | RESPIRATORY_TRACT | Status: DC
  Administered 2011-06-09 – 2011-06-12 (×12): 2.5 mg via RESPIRATORY_TRACT
  Filled 2011-06-09 (×12): qty 0.5

## 2011-06-09 MED ORDER — SODIUM CHLORIDE 0.9 % IV SOLN: 250.0000 mL | INTRAVENOUS | Status: DC | PRN

## 2011-06-09 MED ORDER — INSULIN GLARGINE 100 UNIT/ML ~~LOC~~ SOLN
50.0000 [IU] | Freq: Every day | SUBCUTANEOUS | Status: DC
  Administered 2011-06-09 – 2011-06-10 (×2): 50 [IU] via SUBCUTANEOUS

## 2011-06-09 MED ORDER — SODIUM CHLORIDE 0.9 % IJ SOLN
3.0000 mL | INTRAMUSCULAR | Status: DC | PRN
  Administered 2011-06-12: 3 mL via INTRAVENOUS

## 2011-06-09 MED ORDER — LEVOFLOXACIN IN D5W 250 MG/50ML IV SOLN
250.0000 mg | INTRAVENOUS | Status: DC
  Administered 2011-06-10: 250 mg via INTRAVENOUS
  Filled 2011-06-09 (×2): qty 50

## 2011-06-09 MED ORDER — VANCOMYCIN HCL 1000 MG IV SOLR
1750.0000 mg | INTRAVENOUS | Status: DC
  Administered 2011-06-10: 1750 mg via INTRAVENOUS
  Filled 2011-06-09 (×2): qty 1750

## 2011-06-09 MED ORDER — FUROSEMIDE 10 MG/ML IJ SOLN
40.0000 mg | Freq: Once | INTRAMUSCULAR | Status: AC
  Administered 2011-06-09: 40 mg via INTRAVENOUS
  Filled 2011-06-09: qty 4

## 2011-06-09 MED ORDER — INSULIN ASPART 100 UNIT/ML ~~LOC~~ SOLN
0.0000 [IU] | Freq: Every day | SUBCUTANEOUS | Status: DC
  Administered 2011-06-10 – 2011-06-11 (×2): 3 [IU] via SUBCUTANEOUS

## 2011-06-09 MED ORDER — DEXTROSE 5 % IV SOLN
2.0000 g | INTRAVENOUS | Status: DC
  Administered 2011-06-10 – 2011-06-12 (×3): 2 g via INTRAVENOUS
  Filled 2011-06-09 (×3): qty 2

## 2011-06-09 MED ORDER — LEVOTHYROXINE SODIUM 150 MCG PO TABS
150.0000 ug | ORAL_TABLET | Freq: Every day | ORAL | Status: DC
  Administered 2011-06-09: 150 ug via ORAL
  Filled 2011-06-09 (×2): qty 1

## 2011-06-09 MED ORDER — INSULIN ASPART 100 UNIT/ML ~~LOC~~ SOLN
0.0000 [IU] | Freq: Three times a day (TID) | SUBCUTANEOUS | Status: DC
  Administered 2011-06-09 – 2011-06-10 (×4): 5 [IU] via SUBCUTANEOUS
  Administered 2011-06-10: 2 [IU] via SUBCUTANEOUS
  Administered 2011-06-11: 5 [IU] via SUBCUTANEOUS
  Administered 2011-06-11: 8 [IU] via SUBCUTANEOUS
  Administered 2011-06-11 – 2011-06-12 (×2): 5 [IU] via SUBCUTANEOUS
  Administered 2011-06-12: 8 [IU] via SUBCUTANEOUS

## 2011-06-09 MED ORDER — VITAMIN D3 25 MCG (1000 UNIT) PO TABS
1000.0000 [IU] | ORAL_TABLET | Freq: Every day | ORAL | Status: DC
  Administered 2011-06-09 – 2011-06-12 (×4): 1000 [IU] via ORAL
  Filled 2011-06-09 (×4): qty 1

## 2011-06-09 MED ORDER — CEFEPIME HCL 1 G IJ SOLR
1.0000 g | Freq: Two times a day (BID) | INTRAMUSCULAR | Status: DC
  Filled 2011-06-09 (×2): qty 1

## 2011-06-09 MED ORDER — LEVOFLOXACIN IN D5W 750 MG/150ML IV SOLN
750.0000 mg | Freq: Once | INTRAVENOUS | Status: AC
  Administered 2011-06-09: 750 mg via INTRAVENOUS
  Filled 2011-06-09: qty 150

## 2011-06-09 MED ORDER — BIOTENE DRY MOUTH MT LIQD
15.0000 mL | Freq: Two times a day (BID) | OROMUCOSAL | Status: DC
  Administered 2011-06-09 – 2011-06-12 (×5): 15 mL via OROMUCOSAL

## 2011-06-09 MED ORDER — IPRATROPIUM BROMIDE 0.02 % IN SOLN
0.5000 mg | Freq: Four times a day (QID) | RESPIRATORY_TRACT | Status: DC
  Administered 2011-06-09 – 2011-06-12 (×12): 0.5 mg via RESPIRATORY_TRACT
  Filled 2011-06-09 (×12): qty 2.5

## 2011-06-09 MED ORDER — CLOPIDOGREL BISULFATE 75 MG PO TABS
75.0000 mg | ORAL_TABLET | Freq: Every day | ORAL | Status: DC
  Administered 2011-06-09 – 2011-06-11 (×3): 75 mg via ORAL
  Filled 2011-06-09 (×4): qty 1

## 2011-06-09 MED ORDER — ONDANSETRON HCL 4 MG/2ML IJ SOLN
4.0000 mg | Freq: Once | INTRAMUSCULAR | Status: AC
  Administered 2011-06-09: 4 mg via INTRAVENOUS
  Filled 2011-06-09: qty 2

## 2011-06-09 MED ORDER — FUROSEMIDE 10 MG/ML IJ SOLN
40.0000 mg | Freq: Two times a day (BID) | INTRAMUSCULAR | Status: DC
  Administered 2011-06-09 (×2): 40 mg via INTRAVENOUS
  Filled 2011-06-09 (×4): qty 4

## 2011-06-09 MED ORDER — NITROGLYCERIN 2 % TD OINT
1.0000 [in_us] | TOPICAL_OINTMENT | Freq: Four times a day (QID) | TRANSDERMAL | Status: DC
  Filled 2011-06-09: qty 1

## 2011-06-09 MED ORDER — ASPIRIN EC 81 MG PO TBEC
81.0000 mg | DELAYED_RELEASE_TABLET | Freq: Every day | ORAL | Status: DC
  Administered 2011-06-09 – 2011-06-12 (×4): 81 mg via ORAL
  Filled 2011-06-09 (×4): qty 1

## 2011-06-09 MED ORDER — LEVOFLOXACIN IN D5W 750 MG/150ML IV SOLN: 750.0000 mg | INTRAVENOUS | Status: DC

## 2011-06-09 MED ORDER — ONDANSETRON HCL 4 MG/2ML IJ SOLN: 4.0000 mg | Freq: Four times a day (QID) | INTRAMUSCULAR | Status: DC | PRN

## 2011-06-09 MED ORDER — PANTOPRAZOLE SODIUM 40 MG PO TBEC
40.0000 mg | DELAYED_RELEASE_TABLET | Freq: Every day | ORAL | Status: DC
  Administered 2011-06-09 – 2011-06-12 (×4): 40 mg via ORAL
  Filled 2011-06-09 (×5): qty 1

## 2011-06-09 MED ORDER — GABAPENTIN 300 MG PO CAPS
300.0000 mg | ORAL_CAPSULE | Freq: Three times a day (TID) | ORAL | Status: DC
  Administered 2011-06-09 – 2011-06-12 (×10): 300 mg via ORAL
  Filled 2011-06-09 (×12): qty 1

## 2011-06-09 MED ORDER — MENTHOL 3 MG MT LOZG
1.0000 | LOZENGE | OROMUCOSAL | Status: DC | PRN
  Filled 2011-06-09: qty 9

## 2011-06-09 MED ORDER — METOPROLOL TARTRATE 25 MG PO TABS
25.0000 mg | ORAL_TABLET | Freq: Two times a day (BID) | ORAL | Status: DC
  Administered 2011-06-09 – 2011-06-12 (×7): 25 mg via ORAL
  Filled 2011-06-09 (×8): qty 1

## 2011-06-09 NOTE — H&P (Signed)
History and Physical       Hospital Admission Note Date: 06/09/2011  Patient name: [name] Medical record number: [id number] Date of birth: Feb 06, 1934 Age: 78 y.o. Gender: male PCP: [name], MD, MD   Chief Complaint:  Shortness of breath worsened in last 2 days  HPI: Patient is a 78 year old male with multiple medical problems including diabetes, OSA, chronic diastolic CHF, recent pneumonia, COPD, coronary artery disease, hypothyroidism who was recently admitted to [facility] this month and discharged on 06/04/2011 after treated for CHF exacerbation and pneumonia. Patient was released home and stated that he was closer to his baseline, however in the last 2 days his dyspnea started worsening again. He stated that his cough has not improved and was having yellowish productive phlegm. He denied any fevers or chills however felt nauseous. In the last 2 days, his wife reported that he had not been able to lie flat and was sleeping on the recliner. He noticed slightly worse and abdominal distention and puffy feet. The patient's wife also reported him having generalized weakness. During the previous admission, 2-D echo was done which showed EF of 60-65% with grade 1 diastolic dysfunction, patient was followed by [facility] cardiology in the office after the discharge.  Review of Systems: Positives BOLDED Constitutional: Denies fever, chills, diaphoresis, appetite change. fatigue.  HEENT: Denies photophobia, eye pain, redness, hearing loss, ear pain, sore throat, rhinorrhea, sneezing, mouth sores, trouble swallowing, neck pain, neck stiffness and tinnitus. + Congestion   Respiratory:  See history of present illness Cardiovascular: See history of present illness   Gastrointestinal: Denies abdominal pain, diarrhea, constipation, blood in stool, + nausea Genitourinary: Denies dysuria, urgency, frequency, hematuria, flank pain and  difficulty urinating.  Musculoskeletal: Denies myalgias, joint swelling, arthralgias and gait problem.  has chronic back pain Skin: Denies pallor, rash and wound.  Neurological: Denies dizziness, seizures, syncope, weakness, light-headedness, numbness and headaches.  Hematological: Denies adenopathy. Easy bruising, personal or family bleeding history  Psychiatric/Behavioral: Denies suicidal ideation, mood changes, confusion, nervousness, sleep disturbance and agitation  Past Medical History: Past Medical History  Diagnosis Date  . Diabetes mellitus type II   . Sleep apnea     sleep study done several years ago  . Hypothyroidism   . Hypertension   . Pneumonia   . GERD (gastroesophageal reflux disease)   . Arthritis   . Angina   . Shortness of breath     with exertion  . CHF (congestive heart failure)   . Heart attack   . COPD (chronic obstructive pulmonary disease)   . Coronary artery disease    Past Surgical History  Procedure Date  . Gallbladder surgery   . Back surgery   . Heart stents   . Heart bypass   . Finger surgery right little  . Cholecystectomy   . Coronary artery bypass graft   . Tonsillectomy   . Cardiac catheterization     does not remember last time  . Knee arthroscopy     left  . Lumbar laminectomy/decompression microdiscectomy 04/11/2011    Procedure: LUMBAR LAMINECTOMY/DECOMPRESSION MICRODISCECTOMY 1 LEVEL;  Surgeon: [name], MD;  Location: [facility];  Service: Neurosurgery;  Laterality: Right;  RIGHT Lumbar Three-Four laminectomy with resection of synovial cyst    Medications: Prior to Admission medications   Medication Sig Start Date End Date Taking? Authorizing Provider  allopurinol (ZYLOPRIM) 300 MG tablet Take 150 mg by mouth at bedtime.    Yes Historical Provider, MD  aspirin EC 81 MG tablet Take 81 mg by mouth daily.    Yes Historical Provider, MD  Cholecalciferol (VITAMIN D3) 1000 UNITS CAPS Take 1 capsule by mouth 2 (two) times  daily.    Yes Historical Provider, MD  clopidogrel (PLAVIX) 75 MG tablet Take 75 mg by mouth at bedtime.    Yes Historical Provider, MD  colesevelam (WELCHOL) 625 MG tablet Take 1,875 mg by mouth at bedtime.    Yes Historical Provider, MD  Exenatide (BYETTA 10 MCG PEN [place]) Inject 10 Units into the skin 2 (two) times daily.    Yes Historical Provider, MD  furosemide (LASIX) 20 MG tablet Take 1 tablet (20 mg total) by mouth 2 (two) times daily. 06/04/11 06/03/12 Yes Altha Harm, MD  glipiZIDE (GLUCOTROL) 10 MG tablet Take 20 mg by mouth daily.    Yes Historical Provider, MD  HYDROcodone-acetaminophen (LORTAB) 10-500 MG per tablet Take 1 tablet by mouth every 4 (four) hours as needed. For pain 10/21/10  Yes Historical Provider, MD  insulin glargine (LANTUS) 100 UNIT/ML injection Inject 50 Units into the skin at bedtime.    Yes Historical Provider, MD  levofloxacin (LEVAQUIN) 750 MG tablet Take 1 tablet (750 mg total) by mouth daily. 06/04/11 06/14/11 Yes Altha Harm, MD  levothyroxine (SYNTHROID, LEVOTHROID) 150 MCG tablet Take 150 mcg by mouth at bedtime.    Yes Historical Provider, MD  metoprolol tartrate (LOPRESSOR) 25 MG tablet Take 25 mg by mouth 2 (two) times daily.    Yes Historical Provider, MD  NEURONTIN 300 MG capsule TAKE 1 CAPSULE BY MOUTH THREE TIMES A DAY. 06/05/11  Yes Vickki Hearing, MD  omeprazole (PRILOSEC) 20 MG capsule Take 20 mg by mouth at bedtime.    Yes Historical Provider, MD  simvastatin (ZOCOR) 40 MG tablet Take 40 mg by mouth at bedtime.   Yes Historical Provider, MD  [name] (JANUVIA) 100 MG tablet Take 100 mg by mouth 2 (two) times daily.    Yes Historical Provider, MD  Tiotropium Bromide Monohydrate (SPIRIVA HANDIHALER IN) Inhale 1-2 capsules into the lungs daily. Twice a day if needed for breathing   Yes Historical Provider, MD    Allergies:   Allergies  Allergen Reactions  . Demerol Nausea And Vomiting    Social History:  reports that he has quit  smoking. He does not have any smokeless tobacco history on file. He reports that he does not drink alcohol or use illicit drugs. he lives at home with his wife, ambulates with a walker  Family History: Family History  Problem Relation Age of Onset  . Arthritis    . Cancer    . Diabetes      Physical Exam: Blood pressure 122/58, pulse 68, temperature 99.9 F (37.7 C), temperature source Oral, resp. rate 22, height 5\' 10"  (1.778 m), weight 121.564 kg (268 lb), SpO2 94.00%. General: Alert, awake, oriented x3, in no acute distress, hard of hearing. HEENT: anicteric sclera, pink conjunctiva, pupils equal and reactive to light and accomodation Neck: supple, no masses or lymphadenopathy, no goiter, no bruits  Heart: Regular rate and rhythm, without murmurs, rubs or gallops. Lungs: Decreased breath sound at the bases Abdomen: Obese Soft, nontender, positive bowel sounds, no masses. Extremities: No clubbing, cyanosis. 1+ edema bilaterally Neuro: Grossly intact, no focal neurological deficits, strength 5/5 upper and lower extremities bilaterally Psych: alert and oriented x 3, normal mood and affect Skin: no rashes or lesions, warm and dry   LABS on Admission:  Basic Metabolic Panel:  Lab 06/09/11 4098 06/04/11 0520  NA 134* 137  K 3.7 3.8  CL 97 101  CO2 23 28  GLUCOSE 236* 127*  BUN 25* 25*  CREATININE 1.78* 1.64*  CALCIUM 9.4 9.1  MG -- --  PHOS -- --   Liver Function Tests:  Lab 06/09/11 0543  AST 29  ALT 15  ALKPHOS 99  BILITOT 0.9  PROT 7.7  ALBUMIN 3.7    Lab 06/09/11 0543  LIPASE 30  AMYLASE --   CBC:  Lab 06/09/11 0543 06/04/11 0520  WBC 14.1* 7.2  NEUTROABS -- 3.8  HGB 12.8* 10.8*  HCT 38.2* 33.7*  MCV 96.5 --  PLT 175 216   CBG:  Lab 06/04/11 1057 06/04/11 0740  GLUCAP 257* 113*     Radiological Exams on Admission: Dg Chest Port 1 View  06/09/2011  *RADIOLOGY REPORT*  Clinical Data: Shortness of breath.  PORTABLE CHEST - 1 VIEW  Comparison:  Chest radiograph performed 05/31/2011  Findings: The lungs are well-aerated.  Mild bibasilar airspace opacities raise concern for pneumonia.  Alternatively, interstitial edema could have a similar appearance.  There is no evidence of pleural effusion or pneumothorax.  The cardiomediastinal silhouette is borderline enlarged; the patient is status post median sternotomy, with evidence of prior CABG.  A fractured superiormost sternal wire is again noted.  No acute osseous abnormalities are seen.  IMPRESSION:  1.  Mild bibasilar airspace opacities raise concern for pneumonia; alternatively, interstitial edema could have a similar appearance. 2.  Borderline cardiomegaly.  Original Report Authenticated By: Tonia Ghent, M.D.   Dg Chest Port 1 View  05/31/2011  *RADIOLOGY REPORT*  Clinical Data: Shortness of breath.  PORTABLE CHEST - 1 VIEW  Comparison: 09/01/2010.  Findings: The heart is enlarged but stable.  Stable surgical changes from bypass surgery.  There is vascular congestion and mild interstitial edema suggesting CHF.  No definite pleural effusions. Stable elevation of the right hemidiaphragm.  IMPRESSION: Cardiac enlargement with vascular congestion and mild interstitial edema.  Original Report Authenticated By: P. Loralie Champagne, M.D.   Dg Abd Portable 1v  06/09/2011  *RADIOLOGY REPORT*  Clinical Data: Abdominal pain and abdominal distension; shortness of breath.  PORTABLE ABDOMEN - 1 VIEW  Comparison: Lumbar spine radiographs performed 02/13/2011, and CT of the abdomen and pelvis performed 01/22/2009  Findings: The visualized bowel gas pattern is nonspecific.  The colon is somewhat distended with air, though difficult to fully assess; scattered stool is seen within the colon.  No abnormal dilatation of small bowel loops is seen to suggest small bowel obstruction.  No free intra-abdominal air is identified, though evaluation for free air is limited on supine views.  Mild degenerative change is noted along  the lumbar spine.  The patient is status post median sternotomy.  Clips are noted within the right upper quadrant, reflecting prior cholecystectomy.  The visualized lung bases are essentially clear.  IMPRESSION: Nonspecific bowel gas pattern; colon distended with air, though difficult to fully assess.  Moderate amount of stool noted in the colon.  No evidence for bowel obstruction; no free intra-abdominal air seen.  Original Report Authenticated By: Tonia Ghent, M.D.    Assessment/Plan Present on Admission:   .Dyspnea: Multifactorial secondary to acute on chronic diastolic CHF, HCAP, obesity hypoventilation, OSA, generalized debility   .Healthcare-associated pneumonia: WBC 14.1 (up on previous DC, was 7.2)  - Patient will be placed on vancomycin, levofloxacin, cefepime per HCAP, blood cultures, urine Legionella antigen, urine strep antigen -  Placed on albuterol, Atrovent nebulizers and antitussives   . chronic Back pain: Continue Neurontin and Lortab when necessary   .Diastolic CHF, acute on chronic:  - 2-D echo done during the previous admission, EF of 60-65% with grade 1 diastolic dysfunction, will not repeat - Continue aspirin, beta blocker, statin, Plavix, placed on IV Lasix diuresis, CHF protocol - Cardiology consulted, [facility] to follow  .DM (diabetes mellitus) - Continue Lantus and place on sliding scale insulin, hold oral hypoglycemics   .Hypothyroidism: - Obtain TSH, continue Synthroid   .HTN (hypertension) - Continue beta blocker and Lasix   .COPD (chronic obstructive pulmonary disease): per #2   .Obesity: Patient was counseled on weight control and low salt diet  DVT prophylaxis: TED hoses and Lovenox  CODE STATUS: Full code  Further plan will depend as patient's clinical course evolves and further radiologic and laboratory data become available.   @Time  Spent on Admission: 1 hour Ryn Peine M.D. [facility] 06/09/2011, 8:17 AM

## 2011-06-09 NOTE — Progress Notes (Signed)
ANTIBIOTIC CONSULT NOTE - FOLLOW UP  Pharmacy Consult for Cefepime Indication: HCAP  Allergies  Allergen Reactions  . Demerol Nausea And Vomiting    Patient Measurements: Height: 5\' 11"  (180.3 cm) Weight: 256 lb 2.8 oz (116.2 kg) (scale B) IBW/kg (Calculated) : 75.3   Vital Signs: Temp: 98.5 F (36.9 C) (04/12 1402) Temp src: Oral (04/12 1402) BP: 102/71 mmHg (04/12 1402) Pulse Rate: 61  (04/12 1402) Intake/Output from previous day: 04/11 0701 - 04/12 0700 In: -  Out: 100 [Urine:100] Intake/Output from this shift: Total I/O In: 1080 [P.O.:530; IV Piggyback:550] Out: 800 [Urine:800]  Labs:  [facility] 06/09/11 0900 06/09/11 0543  WBC 18.3* 14.1*  HGB 12.4* 12.8*  PLT 196 175  LABCREA -- --  CREATININE 2.03* 1.78*   Estimated Creatinine Clearance: 40.8 ml/min (by C-G formula based on Cr of 2.03). No results found for this basename: VANCOTROUGH:2,VANCOPEAK:2,VANCORANDOM:2,GENTTROUGH:2,GENTPEAK:2,GENTRANDOM:2,TOBRATROUGH:2,TOBRAPEAK:2,TOBRARND:2,AMIKACINPEAK:2,AMIKACINTROU:2,AMIKACIN:2, in the last 72 hours   Microbiology: Recent Results (from the past 720 hour(s))  CULTURE, BLOOD (ROUTINE X 2)     Status: Normal   Collection Time   05/31/11  8:50 PM      Component Value Range Status Comment   Specimen Description BLOOD RIGHT HAND   Final    Special Requests BOTTLES DRAWN AEROBIC ONLY   Final    Culture  Setup Time [id number]   Final    Culture     Final    Value: STAPHYLOCOCCUS SPECIES (COAGULASE NEGATIVE)     Note: THE SIGNIFICANCE OF ISOLATING THIS ORGANISM FROM A SINGLE SET OF BLOOD CULTURES WHEN MULTIPLE SETS ARE DRAWN IS UNCERTAIN. PLEASE NOTIFY THE MICROBIOLOGY DEPARTMENT WITHIN ONE WEEK IF SPECIATION AND SENSITIVITIES ARE REQUIRED.     Note: Gram Stain Report Called to,Read Back By and Verified With: NEKA SHERARD @ 2308 ON 06/01/11 BY GOLLD   Report Status 06/03/2011 FINAL   Final   CULTURE, BLOOD (ROUTINE X 2)     Status: Normal   Collection Time   05/31/11   8:56 PM      Component Value Range Status Comment   Specimen Description BLOOD LEFT ANTECUBITAL   Final    Special Requests BOTTLES DRAWN AEROBIC AND ANAEROBIC [facility]   Final    Culture  Setup Time [id number]   Final    Culture NO GROWTH 5 DAYS   Final    Report Status 06/07/2011 FINAL   Final   URINE CULTURE     Status: Normal   Collection Time   05/31/11  9:04 PM      Component Value Range Status Comment   Specimen Description URINE, RANDOM   Final    Special Requests NONE   Final    Culture  Setup Time [id number]   Final    Colony Count NO GROWTH   Final    Culture NO GROWTH   Final    Report Status 06/02/2011 FINAL   Final   MRSA PCR SCREENING     Status: Abnormal   Collection Time   06/02/11  3:09 PM      Component Value Range Status Comment   MRSA by PCR POSITIVE (*) NEGATIVE  Final   MRSA PCR SCREENING     Status: Normal   Collection Time   06/09/11  9:44 AM      Component Value Range Status Comment   MRSA by PCR NEGATIVE  NEGATIVE  Final     Anti-infectives     Start     Dose/Rate Route Frequency Ordered Stop  06/10/11 0900   ceFEPIme (MAXIPIME) 2 g in dextrose 5 % 50 mL IVPB        2 g 100 mL/hr over 30 Minutes Intravenous Every 24 hours 06/09/11 1507     06/10/11 0800   vancomycin (VANCOCIN) 1,750 mg in sodium chloride 0.9 % 500 mL IVPB        1,750 mg 250 mL/hr over 120 Minutes Intravenous Every 24 hours 06/09/11 0716     06/10/11 0800   Levofloxacin (LEVAQUIN) IVPB 250 mg        250 mg 50 mL/hr over 60 Minutes Intravenous Every 24 hours 06/09/11 0716     06/09/11 2000   ceFEPIme (MAXIPIME) 1 g in dextrose 5 % 50 mL IVPB  Status:  Discontinued        1 g 100 mL/hr over 30 Minutes Intravenous Every 12 hours 06/09/11 0719 06/09/11 1507   06/09/11 0730   levofloxacin (LEVAQUIN) IVPB 750 mg  Status:  Discontinued        750 mg 100 mL/hr over 90 Minutes Intravenous Every 24 hours 06/09/11 0719 06/09/11 0723   06/09/11 0715   vancomycin (VANCOCIN) 2,000 mg in  sodium chloride 0.9 % 500 mL IVPB        2,000 mg 250 mL/hr over 120 Minutes Intravenous NOW 06/09/11 0716 06/09/11 1113   06/09/11 0700   ceFEPIme (MAXIPIME) 1 g in dextrose 5 % 50 mL IVPB        1 g 100 mL/hr over 30 Minutes Intravenous  Once 06/09/11 0645 06/09/11 0943   06/09/11 0700   levofloxacin (LEVAQUIN) IVPB 750 mg        750 mg 100 mL/hr over 90 Minutes Intravenous  Once 06/09/11 0645 06/09/11 1427          Assessment: 78yo male with HCAP, on day #1 Vanc, Cefepime, Levofloxacin.  Cr up to 2.03   Plan:  1.  Adjust Cefepime to 2gm IV q24, next dose in AM 2.  Watch renal fxn 3.  Plan steady state Vancomycin trough  Marisue Humble, PharmD Clinical Pharmacist [facility]

## 2011-06-09 NOTE — Plan of Care (Signed)
Problem: Phase I Progression Outcomes Goal: EF % per last Echo/documented,Core Reminder form on chart Outcome: Completed/Met Date Met:  06/09/11 60-65% (05/2011)

## 2011-06-09 NOTE — ED Notes (Signed)
Per EMS, the patient was released from [facility] on Sunday after a bout of CHF exacerbation.  Per the patient he has felt progressively worse since then.  The patient has a productive cough that produces thick, white mucus.  Patient denies N/V.

## 2011-06-09 NOTE — Progress Notes (Signed)
Cosign for [employer] RN med [employer]

## 2011-06-09 NOTE — Evaluation (Signed)
Physical Therapy Evaluation Patient Details Name: [name] MRN: [id number] DOB: 04-Aug-1933 Today's Date: 06/09/2011  Problem List:  Patient Active Problem List  Diagnoses  . Back pain, chronic, prior back surg  . Leg weakness, bilateral  . Stiffness of joints, not elsewhere classified, multiple sites  . Diastolic CHF, acute on chronic:  Grade one. EF 60-65%  . UTI (lower urinary tract infection)  . DM (diabetes mellitus)  . Hypothyroidism  . HTN (hypertension)  . COPD (chronic obstructive pulmonary disease)  . CAD, CABG '98, cath '08, low risk Myoview Feb 2012  . Obesity  . Depression  . Dyspnea  . Healthcare-associated pneumonia, just discharged 4/7  . Sleep apnea, on C-pap  . PVD, Lt Vertebral artery PTA in '08, known moderate ICA disease  . Acute renal insufficiency, ? acute, last Cr 1.28 06/01/11    Past Medical History:  Past Medical History  Diagnosis Date  . Diabetes mellitus type II   . Sleep apnea     sleep study done several years ago  . Hypothyroidism   . Hypertension   . Pneumonia   . GERD (gastroesophageal reflux disease)   . Arthritis   . Angina   . Shortness of breath     with exertion  . CHF (congestive heart failure)   . Heart attack   . COPD (chronic obstructive pulmonary disease)   . Coronary artery disease    Past Surgical History:  Past Surgical History  Procedure Date  . Gallbladder surgery   . Back surgery   . Heart stents   . Heart bypass   . Finger surgery right little  . Cholecystectomy   . Coronary artery bypass graft   . Tonsillectomy   . Cardiac catheterization     does not remember last time  . Knee arthroscopy     left  . Lumbar laminectomy/decompression microdiscectomy 04/11/2011    Procedure: LUMBAR LAMINECTOMY/DECOMPRESSION MICRODISCECTOMY 1 LEVEL;  Surgeon: [name], MD;  Location: [facility];  Service: Neurosurgery;  Laterality: Right;  RIGHT Lumbar Three-Four laminectomy with resection of synovial cyst     PT Assessment/Plan/Recommendation PT Assessment Clinical Impression Statement: Pt admitted for SOB/PNA and was very tired at eval, but agreeable to brief session. PTA, pt occassionally used RW at home but needs more instruction on safe use. Pt presents with generalized weakness and decreased activity tolerance and will benefit from skilled PT to address below barriers and reduce falls risk and caregiver burden at home.  PT Recommendation/Assessment: Patient will need skilled PT in the acute care venue PT Problem List: Decreased strength;Decreased activity tolerance;Decreased balance;Decreased mobility;Decreased knowledge of use of DME;Decreased safety awareness;Obesity Problem List Comments: May be limited due to brief eval Barriers to Discharge: None Barriers to Discharge Comments: Will need to be S level for safe DC due wife with bad back PT Therapy Diagnosis : Generalized weakness PT Plan PT Frequency: Min 3X/week PT Treatment/Interventions: DME instruction;Gait training;Stair training;Therapeutic activities;Functional mobility training;Therapeutic exercise;Balance training;Neuromuscular re-education;Patient/family education PT Recommendation Follow Up Recommendations: Home health PT;Supervision/Assistance - 24 hour Equipment Recommended: None recommended by PT;Other (comment) (But pt requesting shower chair) PT Goals  Acute Rehab PT Goals PT Goal Formulation: With patient Time For Goal Achievement: 7 days Pt will go Sit to Stand: with supervision;with upper extremity assist;Other (comment) (With safe hand placement 100% of the time) PT Goal: Sit to Stand - Progress: Goal set today Pt will go Stand to Sit: with supervision;with upper extremity assist;Other (comment) (Safe hand placement  100% of the time) PT Goal: Stand to Sit - Progress: Goal set today Pt will Ambulate: 51 - 150 feet;with supervision;with rolling walker PT Goal: Ambulate - Progress: Goal set today Pt will Go Up /  Down Stairs: 3-5 stairs;with rail(s);with supervision PT Goal: Up/Down Stairs - Progress: Goal set today  PT Evaluation Precautions/Restrictions  Precautions Precautions: Fall Restrictions Weight Bearing Restrictions: No Prior Functioning  Home Living Lives With: Spouse Available Help at Discharge: Family (Wife, but she has bad back) Type of Home: House Home Access: Stairs to enter [employer] of Steps: 2 Entrance Stairs-Rails: Right (grab bar on L) Home Layout: One level Bathroom Shower/Tub: Engineer, manufacturing systems: Handicapped height Home Adaptive Equipment: Straight cane;Walker - rolling;Shower chair without back (Need grab bar for bathtub. ) Prior Function Level of Independence: Independent with assistive device(s) Able to Take Stairs?: Yes Driving: Yes Cognition Cognition Arousal/Alertness: Lethargic Overall Cognitive Status: Appears within functional limits for tasks assessed Orientation Level: Oriented X4 Sensation/Coordination Sensation Light Touch: Appears Intact Coordination Gross Motor Movements are Fluid and Coordinated: Yes Extremity Assessment RUE Assessment RUE Assessment: Within Functional Limits LUE Assessment LUE Assessment: Within Functional Limits RLE Assessment RLE Assessment: Exceptions to [facility] RLE Strength RLE Overall Strength Comments: Grossly 4/5 throughout except hip flexor 3/5 - broke RLE this winter, in boot for 5 weeks LLE Assessment LLE Assessment: Within Functional Limits Mobility (including Balance) Bed Mobility Bed Mobility: Yes Supine to Sit: 5: Supervision;With rails;HOB elevated (Comment degrees) (HOB 30 deg) Supine to Sit Details (indicate cue type and reason): S for safety initially, increased time and effort required Sit to Supine: HOB flat;With rail;6: Modified independent (Device/Increase time) Transfers Transfers: Yes Sit to Stand: 4: Min assist;From elevated surface;With upper extremity assist;From  bed Sit to Stand Details (indicate cue type and reason): Pt resistant of help, required multiple attempts using momentum to stand. Cues for safe hand placement Stand to Sit: 4: Min assist;With upper extremity assist;To bed Stand to Sit Details: Cues for safe hand placement. Min A for steady lowering Ambulation/Gait Ambulation/Gait: Yes Ambulation/Gait Assistance: 4: Min assist Ambulation/Gait Assistance Details (indicate cue type and reason): Instructed pt in RW management. Min A for steadying to bathroom door and back ~ 30'. Verbal cues needed to keep RW on ground, stay inside RW, and keep close. Pt was given RW following back surgery, but likely not instructed on use (no PT) Ambulation Distance (Feet): 30 Feet Assistive device: Rolling walker Gait Pattern: Step-through pattern;Decreased stride length;Trunk flexed Stairs: No  Posture/Postural Control Posture/Postural Control: Postural limitations Postural Limitations: Forward flexed - had back surgery to remove cyst Balance Balance Assessed: Yes Static Standing Balance Static Standing - Balance Support: Bilateral upper extremity supported Static Standing - Level of Assistance: 5: Stand by assistance Static Standing - Comment/# of Minutes: Stood x1 min during functional activity with S at [employer] Balance Dynamic Standing - Balance Support: Bilateral upper extremity supported Dynamic Standing - Level of Assistance: 4: Min assist Dynamic Standing - Balance Activities: Reaching for objects Dynamic Standing - Comments: Min A for steadying as pt tends to move quickly     End of Session PT - End of Session Equipment Utilized During Treatment: Gait belt Activity Tolerance: Patient limited by fatigue Patient left: in bed;with family/visitor present Nurse Communication: Mobility status for transfers;Mobility status for ambulation General Behavior During Session: [facility] for tasks performed Cognition: [facility] for tasks  performed Patient Class: Inpatient Virl Cagey, [place] [phone]  06/09/2011, 3:52 PM

## 2011-06-09 NOTE — Progress Notes (Signed)
ANTIBIOTIC CONSULT NOTE - INITIAL  Pharmacy Consult for vancomycin and levaquin  Indication: pneumonia  Allergies  Allergen Reactions  . Demerol Nausea And Vomiting    Patient Measurements: Height: 5\' 10"  (177.8 cm) Weight: 268 lb (121.564 kg) IBW/kg (Calculated) : 73  Adjusted Body Weight:   Vital Signs: Temp: 99.9 F (37.7 C) (04/12 0514) Temp src: Oral (04/12 0514) BP: 155/59 mmHg (04/12 0514) Pulse Rate: 73  (04/12 0514) Intake/Output from previous day: 04/11 0701 - 04/12 0700 In: -  Out: 100 [Urine:100] Intake/Output from this shift:    Labs:  [facility] 06/09/11 0543  WBC 14.1*  HGB 12.8*  PLT 175  LABCREA --  CREATININE 1.78*   Estimated Creatinine Clearance: 46.9 ml/min (by C-G formula based on Cr of 1.78). No results found for this basename: VANCOTROUGH:2,VANCOPEAK:2,VANCORANDOM:2,GENTTROUGH:2,GENTPEAK:2,GENTRANDOM:2,TOBRATROUGH:2,TOBRAPEAK:2,TOBRARND:2,AMIKACINPEAK:2,AMIKACINTROU:2,AMIKACIN:2, in the last 72 hours   Microbiology: Recent Results (from the past 720 hour(s))  CULTURE, BLOOD (ROUTINE X 2)     Status: Normal   Collection Time   05/31/11  8:50 PM      Component Value Range Status Comment   Specimen Description BLOOD RIGHT HAND   Final    Special Requests BOTTLES DRAWN AEROBIC ONLY   Final    Culture  Setup Time [id number]   Final    Culture     Final    Value: STAPHYLOCOCCUS SPECIES (COAGULASE NEGATIVE)     Note: THE SIGNIFICANCE OF ISOLATING THIS ORGANISM FROM A SINGLE SET OF BLOOD CULTURES WHEN MULTIPLE SETS ARE DRAWN IS UNCERTAIN. PLEASE NOTIFY THE MICROBIOLOGY DEPARTMENT WITHIN ONE WEEK IF SPECIATION AND SENSITIVITIES ARE REQUIRED.     Note: Gram Stain Report Called to,Read Back By and Verified With: NEKA SHERARD @ 2308 ON 06/01/11 BY GOLLD   Report Status 06/03/2011 FINAL   Final   CULTURE, BLOOD (ROUTINE X 2)     Status: Normal   Collection Time   05/31/11  8:56 PM      Component Value Range Status Comment   Specimen Description BLOOD  LEFT ANTECUBITAL   Final    Special Requests BOTTLES DRAWN AEROBIC AND ANAEROBIC [facility]   Final    Culture  Setup Time [id number]   Final    Culture NO GROWTH 5 DAYS   Final    Report Status 06/07/2011 FINAL   Final   URINE CULTURE     Status: Normal   Collection Time   05/31/11  9:04 PM      Component Value Range Status Comment   Specimen Description URINE, RANDOM   Final    Special Requests NONE   Final    Culture  Setup Time [id number]   Final    Colony Count NO GROWTH   Final    Culture NO GROWTH   Final    Report Status 06/02/2011 FINAL   Final   MRSA PCR SCREENING     Status: Abnormal   Collection Time   06/02/11  3:09 PM      Component Value Range Status Comment   MRSA by PCR POSITIVE (*) NEGATIVE  Final     Medical History: Past Medical History  Diagnosis Date  . Diabetes mellitus type II   . Sleep apnea     sleep study done several years ago  . Hypothyroidism   . Hypertension   . Pneumonia   . GERD (gastroesophageal reflux disease)   . Arthritis   . Angina   . Shortness of breath     with exertion  .  CHF (congestive heart failure)   . Heart attack   . COPD (chronic obstructive pulmonary disease)   . Coronary artery disease     Medications:   (Not in a hospital admission) Assessment: HAP--s/p D/C from [name] on 4/4 for chf exacerbation  Goal of Therapy:  Vancomycin trough level 15-20 mcg/ml  Plan:  vancomcyin 2 gm load then 1750 mg q24 hours. Adjusting levaquin to 250 mg q24h  F/u cx's and clinical progress  Janice Coffin 06/09/2011,7:05 AM

## 2011-06-09 NOTE — Progress Notes (Signed)
Mr Silber has had SCr in the 1.4-1.8 range going back to Oct 2012. I suspect recent SCr of 1.2 was secondary to volume overload.   Corine Shelter PA-C 06/09/2011 1:21 PM

## 2011-06-09 NOTE — Consult Note (Signed)
Reason for Consult: Dyspnea  Requesting Physician: Claybon Jabs  HPI: This is a 78 y.o. male with a past medical history significant for recent admission for mixed pneumonia and acute diastolic CHF. He was discharge 06/04/11 and felt better for a few days but developed increasing SOB last 48hrs. In the ER BNP 495, CXR ? pneumonia vs chf. His wgt is unchanged from previous discharge.   PMHx:  Past Medical History  Diagnosis Date  . Diabetes mellitus type II   . Sleep apnea     sleep study done several years ago  . Hypothyroidism   . Hypertension   . Pneumonia   . GERD (gastroesophageal reflux disease)   . Arthritis   . Angina   . Shortness of breath     with exertion  . CHF (congestive heart failure)   . Heart attack   . COPD (chronic obstructive pulmonary disease)   . Coronary artery disease    Past Surgical History  Procedure Date  . Gallbladder surgery   . Back surgery   . Heart stents   . Heart bypass   . Finger surgery right little  . Cholecystectomy   . Coronary artery bypass graft   . Tonsillectomy   . Cardiac catheterization     does not remember last time  . Knee arthroscopy     left  . Lumbar laminectomy/decompression microdiscectomy 04/11/2011    Procedure: LUMBAR LAMINECTOMY/DECOMPRESSION MICRODISCECTOMY 1 LEVEL;  Surgeon: [name], MD;  Location: [facility];  Service: Neurosurgery;  Laterality: Right;  RIGHT Lumbar Three-Four laminectomy with resection of synovial cyst    FAMHx: Family History  Problem Relation Age of Onset  . Arthritis    . Cancer    . Diabetes      SOCHx:  reports that he has quit smoking. He does not have any smokeless tobacco history on file. He reports that he does not drink alcohol or use illicit drugs.  ALLERGIES: Allergies  Allergen Reactions  . Demerol Nausea And Vomiting    ROS: Pertinent items are noted in HPI.  HOME MEDICATIONS: Prescriptions prior to admission  Medication Sig Dispense Refill  .  allopurinol (ZYLOPRIM) 300 MG tablet Take 150 mg by mouth at bedtime.       Marland Kitchen aspirin EC 81 MG tablet Take 81 mg by mouth daily.       . Cholecalciferol (VITAMIN D3) 1000 UNITS CAPS Take 1 capsule by mouth 2 (two) times daily.       . clopidogrel (PLAVIX) 75 MG tablet Take 75 mg by mouth at bedtime.       . colesevelam (WELCHOL) 625 MG tablet Take 1,875 mg by mouth at bedtime.       . Exenatide (BYETTA 10 MCG PEN [place]) Inject 10 Units into the skin 2 (two) times daily.       . furosemide (LASIX) 20 MG tablet Take 1 tablet (20 mg total) by mouth 2 (two) times daily.  60 tablet  0  . glipiZIDE (GLUCOTROL) 10 MG tablet Take 20 mg by mouth daily.       Marland Kitchen HYDROcodone-acetaminophen (LORTAB) 10-500 MG per tablet Take 1 tablet by mouth every 4 (four) hours as needed. For pain      . insulin glargine (LANTUS) 100 UNIT/ML injection Inject 50 Units into the skin at bedtime.       Marland Kitchen levofloxacin (LEVAQUIN) 750 MG tablet Take 1 tablet (750 mg total) by mouth daily.  11 tablet  0  .  levothyroxine (SYNTHROID, LEVOTHROID) 150 MCG tablet Take 150 mcg by mouth at bedtime.       . metoprolol tartrate (LOPRESSOR) 25 MG tablet Take 25 mg by mouth 2 (two) times daily.       Marland Kitchen NEURONTIN 300 MG capsule TAKE 1 CAPSULE BY MOUTH THREE TIMES A DAY.  90 each  2  . omeprazole (PRILOSEC) 20 MG capsule Take 20 mg by mouth at bedtime.       . simvastatin (ZOCOR) 40 MG tablet Take 40 mg by mouth at bedtime.      . [name] (JANUVIA) 100 MG tablet Take 100 mg by mouth 2 (two) times daily.       . Tiotropium Bromide Monohydrate (SPIRIVA HANDIHALER IN) Inhale 1-2 capsules into the lungs daily. Twice a day if needed for breathing        HOSPITAL MEDICATIONS: I have reviewed the patient's current medications.  VITALS: Blood pressure 120/84, pulse 70, temperature 97.8 F (36.6 C), temperature source Oral, resp. rate 22, height 5\' 11"  (1.803 m), weight 116.2 kg (256 lb 2.8 oz), SpO2 95.00%.  PHYSICAL EXAM: General appearance:  alert, cooperative, mild distress and morbidly obese Neck: no JVD Lungs: crackles Lt base Heart: regular rate and rhythm Abdomen: morbid obesity, non tender Extremities: 1+ edema Rt LE Pulses: 2+ and symmetric Skin: Skin color, texture, turgor normal. No rashes or lesions or cool and dry Neurologic: Grossly normal  LABS: Results for orders placed during the hospital encounter of 06/09/11 (from the past 48 hour(s))  PRO B NATRIURETIC PEPTIDE     Status: Abnormal   Collection Time   06/09/11  5:42 AM      Component Value Range Comment   Pro B Natriuretic peptide (BNP) 486.4 (*) 0 - 450 (pg/mL)   CBC     Status: Abnormal   Collection Time   06/09/11  5:43 AM      Component Value Range Comment   WBC 14.1 (*) 4.0 - 10.5 (K/uL)    RBC 3.96 (*) 4.22 - 5.81 (MIL/uL)    Hemoglobin 12.8 (*) 13.0 - 17.0 (g/dL)    HCT 16.1 (*) 09.6 - 52.0 (%)    MCV 96.5  78.0 - 100.0 (fL)    MCH 32.3  26.0 - 34.0 (pg)    MCHC 33.5  30.0 - 36.0 (g/dL)    RDW 04.5  40.9 - 81.1 (%)    Platelets 175  150 - 400 (K/uL)   COMPREHENSIVE METABOLIC PANEL     Status: Abnormal   Collection Time   06/09/11  5:43 AM      Component Value Range Comment   Sodium 134 (*) 135 - 145 (mEq/L)    Potassium 3.7  3.5 - 5.1 (mEq/L)    Chloride 97  96 - 112 (mEq/L)    CO2 23  19 - 32 (mEq/L)    Glucose, Bld 236 (*) 70 - 99 (mg/dL)    BUN 25 (*) 6 - 23 (mg/dL)    Creatinine, Ser 9.14 (*) 0.50 - 1.35 (mg/dL)    Calcium 9.4  8.4 - 10.5 (mg/dL)    Total Protein 7.7  6.0 - 8.3 (g/dL)    Albumin 3.7  3.5 - 5.2 (g/dL)    AST 29  0 - 37 (U/L)    ALT 15  0 - 53 (U/L)    Alkaline Phosphatase 99  39 - 117 (U/L)    Total Bilirubin 0.9  0.3 - 1.2 (mg/dL)    GFR calc  non Af Amer 36 (*) >90 (mL/min)    GFR calc Af Amer 41 (*) >90 (mL/min)   LIPASE, BLOOD     Status: Normal   Collection Time   06/09/11  5:43 AM      Component Value Range Comment   Lipase 30  11 - 59 (U/L)   POCT I-STAT TROPONIN I     Status: Normal   Collection Time     06/09/11  5:59 AM      Component Value Range Comment   Troponin i, poc 0.00  0.00 - 0.08 (ng/mL)    Comment 3            URINALYSIS, ROUTINE W REFLEX MICROSCOPIC     Status: Abnormal   Collection Time   06/09/11  6:55 AM      Component Value Range Comment   Color, Urine YELLOW  YELLOW     APPearance CLOUDY (*) CLEAR     Specific Gravity, Urine 1.015  1.005 - 1.030     pH 5.5  5.0 - 8.0     Glucose, UA NEGATIVE  NEGATIVE (mg/dL)    Hgb urine dipstick NEGATIVE  NEGATIVE     Bilirubin Urine NEGATIVE  NEGATIVE     Ketones, ur NEGATIVE  NEGATIVE (mg/dL)    Protein, ur 30 (*) NEGATIVE (mg/dL)    Urobilinogen, UA 0.2  0.0 - 1.0 (mg/dL)    Nitrite NEGATIVE  NEGATIVE     Leukocytes, UA SMALL (*) NEGATIVE    URINE MICROSCOPIC-ADD ON     Status: Normal   Collection Time   06/09/11  6:55 AM      Component Value Range Comment   Squamous Epithelial / LPF RARE  RARE     WBC, UA 7-10  <3 (WBC/hpf)    RBC / HPF 0-2  <3 (RBC/hpf)    Bacteria, UA RARE  RARE    CARDIAC PANEL(CRET KIN+CKTOT+MB+TROPI)     Status: Normal   Collection Time   06/09/11  9:00 AM      Component Value Range Comment   Total CK 43  7 - 232 (U/L)    CK, MB 2.3  0.3 - 4.0 (ng/mL)    Troponin I <0.30  <0.30 (ng/mL)    Relative Index RELATIVE INDEX IS INVALID  0.0 - 2.5    CBC     Status: Abnormal   Collection Time   06/09/11  9:00 AM      Component Value Range Comment   WBC 18.3 (*) 4.0 - 10.5 (K/uL)    RBC 3.86 (*) 4.22 - 5.81 (MIL/uL)    Hemoglobin 12.4 (*) 13.0 - 17.0 (g/dL)    HCT 16.1 (*) 09.6 - 52.0 (%)    MCV 97.7  78.0 - 100.0 (fL)    MCH 32.1  26.0 - 34.0 (pg)    MCHC 32.9  30.0 - 36.0 (g/dL)    RDW 04.5  40.9 - 81.1 (%)    Platelets 196  150 - 400 (K/uL)   CREATININE, SERUM     Status: Abnormal   Collection Time   06/09/11  9:00 AM      Component Value Range Comment   Creatinine, Ser 2.03 (*) 0.50 - 1.35 (mg/dL)    GFR calc non Af Amer 30 (*) >90 (mL/min)    GFR calc Af Amer 35 (*) >90 (mL/min)      IMAGING: Dg Chest Port 1 View     IMPRESSION:  1.  Mild bibasilar  airspace opacities raise concern for pneumonia; alternatively, interstitial edema could have a similar appearance. 2.  Borderline cardiomegaly.  Original Report Authenticated By: Tonia Ghent, M.D.   EKG: NSR with borderline IVCD and 1 degree AVB  IMPRESSION:  Principal Problem:  *Dyspnea  Active Problems:  Diastolic CHF, acute on chronic:  Grade one. EF 60-65%  COPD (chronic obstructive pulmonary disease)  CAD, CABG '98, cath '08, low risk Myoview Feb 2012  Healthcare-associated pneumonia, just discharged 4/7  Back pain, chronic, prior back surg  DM (diabetes mellitus)  Hypothyroidism  HTN (hypertension)  Obesity  Sleep apnea, on C-pap  PVD, Lt Vertebral artery PTA in '08, known moderate ICA disease   RECOMMENDATION:  MD to see. Rx of pneumonia per primary service. Will check old records to see what his baseline SCr is, (1.28 was when was felt to be volume overloaded). Will order home C-pap.  Time Spent Directly with Patient:  45 minutes  KILROY,LUKE K 06/09/2011, 11:07 AM   Pt. Seen and examined. Known to me from a recent hospitalization at [name]. Agree with the NP/PA-C note as written. This is predominantly a COPD exacerbation - there is mild acute congestive diastolic heart failure. I agree with diuresis.  We will likely have to accept and elevated creatinine of closer to 1.4 to 1.5.  Will follow with you. Thanks for the consult.  Chrystie Nose, MD, [facility] Attending Cardiologist [facility]

## 2011-06-09 NOTE — ED Notes (Signed)
Pt stood at bedside to void 100 cc urine.  Required two assists to stand.  Denies CP.  O2 sats dropped to 88% off O2 to stand.  Reapplied O2 3 L [place] with sats 87

## 2011-06-09 NOTE — ED Notes (Signed)
C/o abd generalized pain 5/10.  Abd distended and tender to touch throughout

## 2011-06-09 NOTE — Progress Notes (Addendum)
Pt arrived to unit. Pt placed on tele, VS taken, and assessed. Pt informed that bed alarm will be placed for first 24hrs and educated to call staff when needing assistance. Pt states understanding. Vanc and maximipe brought up from ED for 0730 dose- will admin. Will cont to monitor.

## 2011-06-09 NOTE — ED Provider Notes (Addendum)
History     CSN: [id number]  Arrival date & time 06/09/11  0510   First MD Initiated Contact with Patient 06/09/11 [phone]      Chief Complaint  Patient presents with  . Shortness of Breath    (Consider location/radiation/quality/duration/timing/severity/associated sxs/prior treatment) HPI Pt states he was in the hospital (admit April 3th and released).  Pt states he started to feel worse this past evening.  He had been feeling better after the hospitalization.  Pt has started coughing again.  Pt also started to feel like he was having more edema.  He has felt diaphoretic at time as well and has had some nausea. He has been having dry heaves. No vomiting or diarhhea.  No fevers.  He complains of pain in the mid abdomen as well.  Pt had decreased appetite but did eat lunch.   Past Medical History  Diagnosis Date  . Diabetes mellitus type II   . Sleep apnea     sleep study done several years ago  . Hypothyroidism   . Hypertension   . Pneumonia   . GERD (gastroesophageal reflux disease)   . Arthritis   . Angina   . Shortness of breath     with exertion  . CHF (congestive heart failure)   . Heart attack   . COPD (chronic obstructive pulmonary disease)   . Coronary artery disease     Past Surgical History  Procedure Date  . Gallbladder surgery   . Back surgery   . Heart stents   . Heart bypass   . Finger surgery right little  . Cholecystectomy   . Coronary artery bypass graft   . Tonsillectomy   . Cardiac catheterization     does not remember last time  . Knee arthroscopy     left  . Lumbar laminectomy/decompression microdiscectomy 04/11/2011    Procedure: LUMBAR LAMINECTOMY/DECOMPRESSION MICRODISCECTOMY 1 LEVEL;  Surgeon: [name], MD;  Location: [facility];  Service: Neurosurgery;  Laterality: Right;  RIGHT Lumbar Three-Four laminectomy with resection of synovial cyst    Family History  Problem Relation Age of Onset  . Arthritis    . Cancer    . Diabetes       History  Substance Use Topics  . Smoking status: Former Games developer  . Smokeless tobacco: Not on file  . Alcohol Use: No      Review of Systems  All other systems reviewed and are negative.    Allergies  Demerol  Home Medications   Current Outpatient Rx  Name Route Sig Dispense Refill  . ALLOPURINOL 300 MG PO TABS Oral Take 150 mg by mouth at bedtime.     . ASPIRIN EC 81 MG PO TBEC Oral Take 81 mg by mouth daily.     Marland Kitchen VITAMIN D3 1000 UNITS PO CAPS Oral Take 1 capsule by mouth 2 (two) times daily.     Marland Kitchen CLOPIDOGREL BISULFATE 75 MG PO TABS Oral Take 75 mg by mouth at bedtime. Stopped on 1-30    . COLESEVELAM HCL 625 MG PO TABS Oral Take 1,875 mg by mouth at bedtime.     Marland Kitchen BYETTA 10 MCG PEN [place] Subcutaneous Inject 10 Units into the skin 2 (two) times daily.     . FUROSEMIDE 20 MG PO TABS Oral Take 1 tablet (20 mg total) by mouth 2 (two) times daily. 60 tablet 0  . GLIPIZIDE 10 MG PO TABS Oral Take 20 mg by mouth daily.     Marland Kitchen  HYDROCODONE-ACETAMINOPHEN 10-500 MG PO TABS Oral Take 1 tablet by mouth every 4 (four) hours as needed. For pain    . INSULIN GLARGINE 100 UNIT/ML [place] SOLN Subcutaneous Inject 50 Units into the skin at bedtime.     Marland Kitchen LEVOFLOXACIN 750 MG PO TABS Oral Take 1 tablet (750 mg total) by mouth daily. 11 tablet 0  . LEVOTHYROXINE SODIUM 150 MCG PO TABS Oral Take 150 mcg by mouth at bedtime.     Marland Kitchen METOPROLOL TARTRATE 25 MG PO TABS Oral Take 25 mg by mouth 2 (two) times daily.     Marland Kitchen NEURONTIN 300 MG PO CAPS  TAKE 1 CAPSULE BY MOUTH THREE TIMES A DAY. 90 each 2  . OMEPRAZOLE 20 MG PO CPDR Oral Take 20 mg by mouth at bedtime.     Marland Kitchen SIMVASTATIN 40 MG PO TABS Oral Take 40 mg by mouth at bedtime.    Marland Kitchen [name] PHOSPHATE 100 MG PO TABS Oral Take 100 mg by mouth 2 (two) times daily.     Marland Kitchen SPIRIVA HANDIHALER IN Inhalation Inhale 1-2 capsules into the lungs daily. Twice a day if needed for breathing      BP 155/59  Pulse 73  Temp(Src) 99.9 F (37.7 C) (Oral)  Resp 22   SpO2 96%  Physical Exam  Nursing note and vitals reviewed. Constitutional:       Uncomfortable appearing  HENT:  Head: Normocephalic and atraumatic.  Right Ear: External ear normal.  Left Ear: External ear normal.  Eyes: Conjunctivae are normal. Right eye exhibits no discharge. Left eye exhibits no discharge. No scleral icterus.  Neck: Neck supple. No tracheal deviation present.  Cardiovascular: Normal rate, regular rhythm and intact distal pulses.   Pulmonary/Chest: Effort normal. No stridor. No respiratory distress. He has no wheezes. He has rales.  Abdominal: Soft. Bowel sounds are normal. He exhibits no distension. There is tenderness (epigastric region). There is no rebound and no guarding.  Musculoskeletal: He exhibits tenderness ( trace edema bilateral lower extremities). He exhibits no edema.  Neurological: He is alert. He has normal strength. No sensory deficit. Cranial nerve deficit:  no gross defecits noted. He exhibits normal muscle tone. He displays no seizure activity. Coordination normal.  Skin: Skin is warm and dry. No rash noted. There is pallor.  Psychiatric: He has a normal mood and affect.    ED Course  Procedures (including critical care time)  Date: 06/09/2011  Rate: 72  Rhythm: normal sinus rhythm  QRS Axis: normal  Intervals: normal  ST/T Wave abnormalities: normal  Conduction Disutrbances:none  Narrative Interpretation: Borderline intraventricular conduction delay, sinus arrhythmia  Old EKG Reviewed: none available  Medications  ceFEPIme (MAXIPIME) 1 g in dextrose 5 % 50 mL IVPB (not administered)  levofloxacin (LEVAQUIN) IVPB 750 mg (not administered)  ondansetron (ZOFRAN) injection 4 mg (4 mg Intravenous Given 06/09/11 0548)  furosemide (LASIX) injection 40 mg (40 mg Intravenous Given 06/09/11 0644)     Labs Reviewed  PRO B NATRIURETIC PEPTIDE - Abnormal; Notable for the following:    Pro B Natriuretic peptide (BNP) 486.4 (*)    All other  components within normal limits  CBC - Abnormal; Notable for the following:    WBC 14.1 (*)    RBC 3.96 (*)    Hemoglobin 12.8 (*)    HCT 38.2 (*)    All other components within normal limits  COMPREHENSIVE METABOLIC PANEL - Abnormal; Notable for the following:    Sodium 134 (*)  Glucose, Bld 236 (*)    BUN 25 (*)    Creatinine, Ser 1.78 (*)    GFR calc non Af Amer 36 (*)    GFR calc Af Amer 41 (*)    All other components within normal limits  LIPASE, BLOOD  POCT I-STAT TROPONIN I  URINALYSIS, ROUTINE W REFLEX MICROSCOPIC   Dg Chest Port 1 View  06/09/2011  *RADIOLOGY REPORT*  Clinical Data: Shortness of breath.  PORTABLE CHEST - 1 VIEW  Comparison: Chest radiograph performed 05/31/2011  Findings: The lungs are well-aerated.  Mild bibasilar airspace opacities raise concern for pneumonia.  Alternatively, interstitial edema could have a similar appearance.  There is no evidence of pleural effusion or pneumothorax.  The cardiomediastinal silhouette is borderline enlarged; the patient is status post median sternotomy, with evidence of prior CABG.  A fractured superiormost sternal wire is again noted.  No acute osseous abnormalities are seen.  IMPRESSION:  1.  Mild bibasilar airspace opacities raise concern for pneumonia; alternatively, interstitial edema could have a similar appearance. 2.  Borderline cardiomegaly.  Original Report Authenticated By: Tonia Ghent, M.D.   Dg Abd Portable 1v  06/09/2011  *RADIOLOGY REPORT*  Clinical Data: Abdominal pain and abdominal distension; shortness of breath.  PORTABLE ABDOMEN - 1 VIEW  Comparison: Lumbar spine radiographs performed 02/13/2011, and CT of the abdomen and pelvis performed 01/22/2009  Findings: The visualized bowel gas pattern is nonspecific.  The colon is somewhat distended with air, though difficult to fully assess; scattered stool is seen within the colon.  No abnormal dilatation of small bowel loops is seen to suggest small bowel  obstruction.  No free intra-abdominal air is identified, though evaluation for free air is limited on supine views.  Mild degenerative change is noted along the lumbar spine.  The patient is status post median sternotomy.  Clips are noted within the right upper quadrant, reflecting prior cholecystectomy.  The visualized lung bases are essentially clear.  IMPRESSION: Nonspecific bowel gas pattern; colon distended with air, though difficult to fully assess.  Moderate amount of stool noted in the colon.  No evidence for bowel obstruction; no free intra-abdominal air seen.  Original Report Authenticated By: Tonia Ghent, M.D.     MDM  Pt was previously admitted to the hospital for possible PNA vs CHF.  Pt was started on abx but also treated for CHF.  Pt complains of similar symptoms again today.  CXR suggests possible pna.  BNP not significantly elevated today however pt complains of weight gain, edema.  No acute signs of cardiac ischemia.  Will admit for further evaluation, treatment.   Celene Kras, MD 06/09/11 [phone]

## 2011-06-09 NOTE — ED Notes (Signed)
Correction to 0650 note.  O2 sats increased to 97% on 3l [place]

## 2011-06-10 LAB — BASIC METABOLIC PANEL
Calcium: 8.9 mg/dL (ref 8.4–10.5)
GFR calc Af Amer: 29 mL/min — ABNORMAL LOW (ref >90)
GFR calc non Af Amer: 25 mL/min — ABNORMAL LOW (ref >90)
Potassium: 4 mEq/L (ref 3.5–5.1)
Sodium: 135 mEq/L (ref 135–145)

## 2011-06-10 LAB — PRO B NATRIURETIC PEPTIDE: Pro B Natriuretic peptide (BNP): 1059 pg/mL — ABNORMAL HIGH (ref 0–450)

## 2011-06-10 LAB — GLUCOSE, CAPILLARY: Glucose-Capillary: 276 mg/dL — ABNORMAL HIGH (ref 70–99)

## 2011-06-10 MED ORDER — LEVOFLOXACIN IN D5W 500 MG/100ML IV SOLN
500.0000 mg | INTRAVENOUS | Status: DC
  Administered 2011-06-11: 500 mg via INTRAVENOUS
  Filled 2011-06-10: qty 100

## 2011-06-10 MED ORDER — FUROSEMIDE 40 MG PO TABS
40.0000 mg | ORAL_TABLET | Freq: Two times a day (BID) | ORAL | Status: DC
  Administered 2011-06-10 – 2011-06-12 (×5): 40 mg via ORAL
  Filled 2011-06-10 (×7): qty 1

## 2011-06-10 MED ORDER — LEVOTHYROXINE SODIUM 175 MCG PO TABS
175.0000 ug | ORAL_TABLET | Freq: Every day | ORAL | Status: DC
  Administered 2011-06-10 – 2011-06-11 (×2): 175 ug via ORAL
  Filled 2011-06-10 (×3): qty 1

## 2011-06-10 NOTE — Progress Notes (Signed)
Spoke with patient regarding cpap.  Pt stated he has been putting cpap on himself without difficulty and will do the same tonight.  Cpap settings 10cm h2o with full face mask as pt wears this at home.  Pt advised that RT available all night should he need assistance.

## 2011-06-10 NOTE — Progress Notes (Signed)
[facility]  DAILY PROGRESS NOTE   Subjective:  Creatinine continues to rise with diuresis - this happened during his previous admission. Only net negative 352 cc recorded, but subjectively more. Weight is down 5 KG. Troponins negative. Breathing is subjectively better. He is coughing up scant bloody mucous.  Objective:  Temp:  [97.8 F (36.6 C)-98.5 F (36.9 C)] 97.8 F (36.6 C) (04/12 2214) Pulse Rate:  [60-70] 69  (04/12 2325) Resp:  [18-22] 18  (04/12 2325) BP: (92-120)/(44-84) 108/50 mmHg (04/12 2215) SpO2:  [95 %-100 %] 97 % (04/13 0231) Weight:  [116.2 kg (256 lb 2.8 oz)] 116.2 kg (256 lb 2.8 oz) (04/12 0854) Weight change: -5.364 kg (-11 lb 13.2 oz)  Intake/Output from previous day: 04/12 0701 - 04/13 0700 In: 1308 [P.O.:750; IV Piggyback:558] Out: 1560 [Urine:1560]  Intake/Output from this shift:    Medications: Current Facility-Administered Medications  Medication Dose Route Frequency Provider Last Rate Last Dose  . 0.9 %  sodium chloride infusion  250 mL Intravenous PRN [name], MD      . acetaminophen (TYLENOL) tablet 650 mg  650 mg Oral Q4H PRN Ripudeep K Rai, MD      . albuterol (PROVENTIL) (5 MG/ML) 0.5% nebulizer solution 2.5 mg  2.5 mg Nebulization Q6H Ripudeep K Rai, MD   2.5 mg at 06/10/11 0834  . allopurinol (ZYLOPRIM) tablet 150 mg  150 mg Oral QHS [name], MD   150 mg at 06/09/11 2214  . antiseptic oral rinse (BIOTENE) solution 15 mL  15 mL Mouth Rinse BID Ripudeep K Rai, MD   15 mL at 06/09/11 1100  . aspirin EC tablet 81 mg  81 mg Oral Daily [name], MD   81 mg at 06/09/11 1249  . benzonatate (TESSALON) capsule 100 mg  100 mg Oral TID [name], MD   100 mg at 06/09/11 2214  . ceFEPIme (MAXIPIME) 1 g in dextrose 5 % 50 mL IVPB  1 g Intravenous Once Celene Kras, MD   1 g at 06/09/11 0913  . ceFEPIme (MAXIPIME) 2 g in dextrose 5 % 50 mL IVPB  2 g Intravenous Q24H Kendra P Hiatt, PHARMD      .  cholecalciferol (VITAMIN D) tablet 1,000 Units  1,000 Units Oral Daily [name], MD   1,000 Units at 06/09/11 1248  . clopidogrel (PLAVIX) tablet 75 mg  75 mg Oral QHS [name], MD   75 mg at 06/09/11 2214  . colesevelam [facility]) tablet 1,875 mg  1,875 mg Oral QHS [name], MD   1,875 mg at 06/09/11 2221  . enoxaparin (LOVENOX) injection 40 mg  40 mg Subcutaneous Q24H Ripudeep K Rai, MD   40 mg at 06/09/11 1241  . furosemide (LASIX) injection 40 mg  40 mg Intravenous Q12H [name], MD   40 mg at 06/09/11 2214  . gabapentin (NEURONTIN) capsule 300 mg  300 mg Oral TID Ripudeep K Rai, MD   300 mg at 06/09/11 2214  . guaiFENesin (ROBITUSSIN) 100 MG/5ML solution 200 mg  10 mL Oral Q4H PRN Ripudeep K Rai, MD      . insulin aspart (novoLOG) injection 0-15 Units  0-15 Units Subcutaneous TID WC [name], MD   2 Units at 06/10/11 0751  . insulin aspart (novoLOG) injection 0-5 Units  0-5 Units Subcutaneous QHS Ripudeep K Rai, MD      . insulin glargine (LANTUS) injection  50 Units  50 Units Subcutaneous QHS [name], MD   50 Units at 06/09/11 2227  . ipratropium (ATROVENT) nebulizer solution 0.5 mg  0.5 mg Nebulization Q6H Ripudeep K Rai, MD   0.5 mg at 06/10/11 0834  . Levofloxacin (LEVAQUIN) IVPB 250 mg  250 mg Intravenous Q24H Celene Kras, MD      . levofloxacin [facility]) IVPB 750 mg  750 mg Intravenous Once Celene Kras, MD   750 mg at 06/09/11 1257  . levothyroxine (SYNTHROID, LEVOTHROID) tablet 150 mcg  150 mcg Oral QHS [name], MD   150 mcg at 06/09/11 2215  . linagliptin (TRADJENTA) tablet 5 mg  5 mg Oral Daily Ripudeep K Rai, MD   5 mg at 06/09/11 1248  . menthol-cetylpyridinium (CEPACOL) lozenge 3 mg  1 lozenge Oral PRN Ripudeep K Rai, MD      . metoprolol tartrate (LOPRESSOR) tablet 25 mg  25 mg Oral BID [name], MD   25 mg at 06/09/11 2215  . ondansetron (ZOFRAN) injection 4 mg  4 mg Intravenous Q6H PRN Ripudeep K Rai, MD      . pantoprazole  (PROTONIX) EC tablet 40 mg  40 mg Oral Q1200 [name], MD   40 mg at 06/09/11 1247  . simvastatin (ZOCOR) tablet 40 mg  40 mg Oral QHS [name], MD   40 mg at 06/09/11 2215  . sodium chloride 0.9 % injection 3 mL  3 mL Intravenous Q12H Ripudeep K Rai, MD   3 mL at 06/09/11 2223  . sodium chloride 0.9 % injection 3 mL  3 mL Intravenous PRN [name], MD      . vancomycin (VANCOCIN) 1,750 mg in sodium chloride 0.9 % 500 mL IVPB  1,750 mg Intravenous Q24H Celene Kras, MD      . vancomycin (VANCOCIN) 2,000 mg in sodium chloride 0.9 % 500 mL IVPB  2,000 mg Intravenous NOW Celene Kras, MD   2,000 mg at 06/09/11 0913  . DISCONTD: ceFEPIme (MAXIPIME) 1 g in dextrose 5 % 50 mL IVPB  1 g Intravenous Q12H Celene Kras, MD      . DISCONTD: guaifenesin (ROBITUSSIN) 100 MG/5ML syrup 200 mg  200 mg Oral Q4H PRN [name], MD      . DISCONTD: Vitamin D3 CAPS 1,000 Units  1 capsule Oral BID [name], MD        Physical Exam: General appearance: alert and no distress Neck: no adenopathy, no carotid bruit, supple, symmetrical, trachea midline and thyroid not enlarged, symmetric, no tenderness/mass/nodules Lungs: clear to auscultation bilaterally Heart: regular rate and rhythm, S1, S2 normal, no murmur, click, rub or gallop Abdomen: soft, non-tender; bowel sounds normal; no masses,  no organomegaly and obese Extremities: extremities normal, atraumatic, no cyanosis or edema Pulses: 2+ and symmetric  Lab Results: Results for orders placed during the hospital encounter of 06/09/11 (from the past 48 hour(s))  PRO B NATRIURETIC PEPTIDE     Status: Abnormal   Collection Time   06/09/11  5:42 AM      Component Value Range Comment   Pro B Natriuretic peptide (BNP) 486.4 (*) 0 - 450 (pg/mL)   CBC     Status: Abnormal   Collection Time   06/09/11  5:43 AM      Component Value Range Comment   WBC 14.1 (*) 4.0 - 10.5 (K/uL)    RBC 3.96 (*) 4.22 - 5.81 (  MIL/uL)    Hemoglobin 12.8 (*) 13.0 -  17.0 (g/dL)    HCT 16.1 (*) 09.6 - 52.0 (%)    MCV 96.5  78.0 - 100.0 (fL)    MCH 32.3  26.0 - 34.0 (pg)    MCHC 33.5  30.0 - 36.0 (g/dL)    RDW 04.5  40.9 - 81.1 (%)    Platelets 175  150 - 400 (K/uL)   COMPREHENSIVE METABOLIC PANEL     Status: Abnormal   Collection Time   06/09/11  5:43 AM      Component Value Range Comment   Sodium 134 (*) 135 - 145 (mEq/L)    Potassium 3.7  3.5 - 5.1 (mEq/L)    Chloride 97  96 - 112 (mEq/L)    CO2 23  19 - 32 (mEq/L)    Glucose, Bld 236 (*) 70 - 99 (mg/dL)    BUN 25 (*) 6 - 23 (mg/dL)    Creatinine, Ser 9.14 (*) 0.50 - 1.35 (mg/dL)    Calcium 9.4  8.4 - 10.5 (mg/dL)    Total Protein 7.7  6.0 - 8.3 (g/dL)    Albumin 3.7  3.5 - 5.2 (g/dL)    AST 29  0 - 37 (U/L)    ALT 15  0 - 53 (U/L)    Alkaline Phosphatase 99  39 - 117 (U/L)    Total Bilirubin 0.9  0.3 - 1.2 (mg/dL)    GFR calc non Af Amer 36 (*) >90 (mL/min)    GFR calc Af Amer 41 (*) >90 (mL/min)   LIPASE, BLOOD     Status: Normal   Collection Time   06/09/11  5:43 AM      Component Value Range Comment   Lipase 30  11 - 59 (U/L)   POCT I-STAT TROPONIN I     Status: Normal   Collection Time   06/09/11  5:59 AM      Component Value Range Comment   Troponin i, poc 0.00  0.00 - 0.08 (ng/mL)    Comment 3            URINALYSIS, ROUTINE W REFLEX MICROSCOPIC     Status: Abnormal   Collection Time   06/09/11  6:55 AM      Component Value Range Comment   Color, Urine YELLOW  YELLOW     APPearance CLOUDY (*) CLEAR     Specific Gravity, Urine 1.015  1.005 - 1.030     pH 5.5  5.0 - 8.0     Glucose, UA NEGATIVE  NEGATIVE (mg/dL)    Hgb urine dipstick NEGATIVE  NEGATIVE     Bilirubin Urine NEGATIVE  NEGATIVE     Ketones, ur NEGATIVE  NEGATIVE (mg/dL)    Protein, ur 30 (*) NEGATIVE (mg/dL)    Urobilinogen, UA 0.2  0.0 - 1.0 (mg/dL)    Nitrite NEGATIVE  NEGATIVE     Leukocytes, UA SMALL (*) NEGATIVE    URINE MICROSCOPIC-ADD ON     Status: Normal   Collection Time   06/09/11  6:55 AM       Component Value Range Comment   Squamous Epithelial / LPF RARE  RARE     WBC, UA 7-10  <3 (WBC/hpf)    RBC / HPF 0-2  <3 (RBC/hpf)    Bacteria, UA RARE  RARE    HEMOGLOBIN A1C     Status: Abnormal   Collection Time   06/09/11  9:00 AM  Component Value Range Comment   Hemoglobin A1C 6.9 (*) <5.7 (%)    Mean Plasma Glucose 151 (*) <117 (mg/dL)   CARDIAC PANEL(CRET KIN+CKTOT+MB+TROPI)     Status: Normal   Collection Time   06/09/11  9:00 AM      Component Value Range Comment   Total CK 43  7 - 232 (U/L)    CK, MB 2.3  0.3 - 4.0 (ng/mL)    Troponin I <0.30  <0.30 (ng/mL)    Relative Index RELATIVE INDEX IS INVALID  0.0 - 2.5    TSH     Status: Abnormal   Collection Time   06/09/11  9:00 AM      Component Value Range Comment   TSH 10.342 (*) 0.350 - 4.500 (uIU/mL)   CBC     Status: Abnormal   Collection Time   06/09/11  9:00 AM      Component Value Range Comment   WBC 18.3 (*) 4.0 - 10.5 (K/uL)    RBC 3.86 (*) 4.22 - 5.81 (MIL/uL)    Hemoglobin 12.4 (*) 13.0 - 17.0 (g/dL)    HCT 40.9 (*) 81.1 - 52.0 (%)    MCV 97.7  78.0 - 100.0 (fL)    MCH 32.1  26.0 - 34.0 (pg)    MCHC 32.9  30.0 - 36.0 (g/dL)    RDW 91.4  78.2 - 95.6 (%)    Platelets 196  150 - 400 (K/uL)   CREATININE, SERUM     Status: Abnormal   Collection Time   06/09/11  9:00 AM      Component Value Range Comment   Creatinine, Ser 2.03 (*) 0.50 - 1.35 (mg/dL)    GFR calc non Af Amer 30 (*) >90 (mL/min)    GFR calc Af Amer 35 (*) >90 (mL/min)   GLUCOSE, CAPILLARY     Status: Abnormal   Collection Time   06/09/11  9:23 AM      Component Value Range Comment   Glucose-Capillary 233 (*) 70 - 99 (mg/dL)   MRSA PCR SCREENING     Status: Normal   Collection Time   06/09/11  9:44 AM      Component Value Range Comment   MRSA by PCR NEGATIVE  NEGATIVE    GLUCOSE, CAPILLARY     Status: Abnormal   Collection Time   06/09/11 11:08 AM      Component Value Range Comment   Glucose-Capillary 231 (*) 70 - 99 (mg/dL)    Comment 1  Notify RN     STREP PNEUMONIAE URINARY ANTIGEN     Status: Normal   Collection Time   06/09/11  1:16 PM      Component Value Range Comment   Strep Pneumo Urinary Antigen NEGATIVE  NEGATIVE    CARDIAC PANEL(CRET KIN+CKTOT+MB+TROPI)     Status: Normal   Collection Time   06/09/11  2:59 PM      Component Value Range Comment   Total CK 43  7 - 232 (U/L)    CK, MB 2.1  0.3 - 4.0 (ng/mL)    Troponin I <0.30  <0.30 (ng/mL)    Relative Index RELATIVE INDEX IS INVALID  0.0 - 2.5    GLUCOSE, CAPILLARY     Status: Abnormal   Collection Time   06/09/11  4:03 PM      Component Value Range Comment   Glucose-Capillary 209 (*) 70 - 99 (mg/dL)    Comment 1 Notify RN  CARDIAC PANEL(CRET KIN+CKTOT+MB+TROPI)     Status: Normal   Collection Time   06/09/11  9:38 PM      Component Value Range Comment   Total CK 39  7 - 232 (U/L)    CK, MB 2.0  0.3 - 4.0 (ng/mL)    Troponin I <0.30  <0.30 (ng/mL)    Relative Index RELATIVE INDEX IS INVALID  0.0 - 2.5    GLUCOSE, CAPILLARY     Status: Abnormal   Collection Time   06/09/11  9:40 PM      Component Value Range Comment   Glucose-Capillary 134 (*) 70 - 99 (mg/dL)    Comment 1 Notify RN      Comment 2 Documented in Chart     BASIC METABOLIC PANEL     Status: Abnormal   Collection Time   06/10/11  6:00 AM      Component Value Range Comment   Sodium 135  135 - 145 (mEq/L)    Potassium 4.0  3.5 - 5.1 (mEq/L)    Chloride 95 (*) 96 - 112 (mEq/L)    CO2 29  19 - 32 (mEq/L)    Glucose, Bld 144 (*) 70 - 99 (mg/dL)    BUN 31 (*) 6 - 23 (mg/dL)    Creatinine, Ser 3.38 (*) 0.50 - 1.35 (mg/dL)    Calcium 8.9  8.4 - 10.5 (mg/dL)    GFR calc non Af Amer 25 (*) >90 (mL/min)    GFR calc Af Amer 29 (*) >90 (mL/min)   GLUCOSE, CAPILLARY     Status: Abnormal   Collection Time   06/10/11  6:08 AM      Component Value Range Comment   Glucose-Capillary 130 (*) 70 - 99 (mg/dL)     Imaging: Dg Chest Port 1 View  06/09/2011  *RADIOLOGY REPORT*  Clinical Data:  Shortness of breath.  PORTABLE CHEST - 1 VIEW  Comparison: Chest radiograph performed 05/31/2011  Findings: The lungs are well-aerated.  Mild bibasilar airspace opacities raise concern for pneumonia.  Alternatively, interstitial edema could have a similar appearance.  There is no evidence of pleural effusion or pneumothorax.  The cardiomediastinal silhouette is borderline enlarged; the patient is status post median sternotomy, with evidence of prior CABG.  A fractured superiormost sternal wire is again noted.  No acute osseous abnormalities are seen.  IMPRESSION:  1.  Mild bibasilar airspace opacities raise concern for pneumonia; alternatively, interstitial edema could have a similar appearance. 2.  Borderline cardiomegaly.  Original Report Authenticated By: Tonia Ghent, M.D.   Dg Abd Portable 1v  06/09/2011  *RADIOLOGY REPORT*  Clinical Data: Abdominal pain and abdominal distension; shortness of breath.  PORTABLE ABDOMEN - 1 VIEW  Comparison: Lumbar spine radiographs performed 02/13/2011, and CT of the abdomen and pelvis performed 01/22/2009  Findings: The visualized bowel gas pattern is nonspecific.  The colon is somewhat distended with air, though difficult to fully assess; scattered stool is seen within the colon.  No abnormal dilatation of small bowel loops is seen to suggest small bowel obstruction.  No free intra-abdominal air is identified, though evaluation for free air is limited on supine views.  Mild degenerative change is noted along the lumbar spine.  The patient is status post median sternotomy.  Clips are noted within the right upper quadrant, reflecting prior cholecystectomy.  The visualized lung bases are essentially clear.  IMPRESSION: Nonspecific bowel gas pattern; colon distended with air, though difficult to fully assess.  Moderate amount of stool  noted in the colon.  No evidence for bowel obstruction; no free intra-abdominal air seen.  Original Report Authenticated By: Tonia Ghent, M.D.     Assessment:  1. Principal Problem: 2.  *Dyspnea 3. Active Problems: 4.  Back pain, chronic, prior back surg 5.  Diastolic CHF, acute on chronic:  Grade one. EF 60-65% 6.  DM (diabetes mellitus) 7.  Hypothyroidism 8.  HTN (hypertension) 9.  COPD (chronic obstructive pulmonary disease) 10.  CAD, CABG '98, cath '08, low risk Myoview Feb 2012 11.  Obesity 12.  Healthcare-associated pneumonia, just discharged 4/7 13.  Sleep apnea, on C-pap 14.  PVD, Lt Vertebral artery PTA in '08, known moderate ICA disease 15.  Acute renal insufficiency, ? acute, last Cr 1.28 06/01/11 16.   Plan:  1. Improving pneumonia and mild diastolic heart failure exacerbation. Would switch to lasix 40 mg po BID today due to rising creatinine. He was on 20 mg daily at home.  Re-check BNP.    Time Spent Directly with Patient:  15 minutes  Length of Stay:  LOS: 1 day   Chrystie Nose, MD, [facility] Attending Cardiologist [facility]  Galya Dunnigan C 06/10/2011, 8:38 AM

## 2011-06-10 NOTE — Progress Notes (Signed)
ANTIBIOTIC CONSULT NOTE - FOLLOW UP  Pharmacy Consult for Cefepime Indication: HCAP  Allergies  Allergen Reactions  . Demerol Nausea And Vomiting    Patient Measurements: Height: 5\' 11"  (180.3 cm) Weight: 256 lb 2.8 oz (116.2 kg) (scale B) IBW/kg (Calculated) : 75.3   Vital Signs: Temp: 98 F (36.7 C) (04/13 1509) Temp src: Oral (04/13 1509) BP: 113/45 mmHg (04/13 1509) Pulse Rate: 60  (04/13 1509) Intake/Output from previous day: 04/12 0701 - 04/13 0700 In: 1308 [P.O.:750; IV Piggyback:558] Out: 1560 [Urine:1560] Intake/Output from this shift: Total I/O In: 960 [P.O.:960] Out: 375 [Urine:375]  Labs:  [facility] 06/10/11 0600 06/09/11 0900 06/09/11 0543  WBC -- 18.3* 14.1*  HGB -- 12.4* 12.8*  PLT -- 196 175  LABCREA -- -- --  CREATININE 2.35* 2.03* 1.78*   Estimated Creatinine Clearance: 35.2 ml/min (by C-G formula based on Cr of 2.35). No results found for this basename: VANCOTROUGH:2,VANCOPEAK:2,VANCORANDOM:2,GENTTROUGH:2,GENTPEAK:2,GENTRANDOM:2,TOBRATROUGH:2,TOBRAPEAK:2,TOBRARND:2,AMIKACINPEAK:2,AMIKACINTROU:2,AMIKACIN:2, in the last 72 hours   Microbiology: Recent Results (from the past 720 hour(s))  CULTURE, BLOOD (ROUTINE X 2)     Status: Normal   Collection Time   05/31/11  8:50 PM      Component Value Range Status Comment   Specimen Description BLOOD RIGHT HAND   Final    Special Requests BOTTLES DRAWN AEROBIC ONLY   Final    Culture  Setup Time [id number]   Final    Culture     Final    Value: STAPHYLOCOCCUS SPECIES (COAGULASE NEGATIVE)     Note: THE SIGNIFICANCE OF ISOLATING THIS ORGANISM FROM A SINGLE SET OF BLOOD CULTURES WHEN MULTIPLE SETS ARE DRAWN IS UNCERTAIN. PLEASE NOTIFY THE MICROBIOLOGY DEPARTMENT WITHIN ONE WEEK IF SPECIATION AND SENSITIVITIES ARE REQUIRED.     Note: Gram Stain Report Called to,Read Back By and Verified With: NEKA SHERARD @ 2308 ON 06/01/11 BY GOLLD   Report Status 06/03/2011 FINAL   Final   CULTURE, BLOOD (ROUTINE X 2)      Status: Normal   Collection Time   05/31/11  8:56 PM      Component Value Range Status Comment   Specimen Description BLOOD LEFT ANTECUBITAL   Final    Special Requests BOTTLES DRAWN AEROBIC AND ANAEROBIC [name]   Final    Culture  Setup Time [id number]   Final    Culture NO GROWTH 5 DAYS   Final    Report Status 06/07/2011 FINAL   Final   URINE CULTURE     Status: Normal   Collection Time   05/31/11  9:04 PM      Component Value Range Status Comment   Specimen Description URINE, RANDOM   Final    Special Requests NONE   Final    Culture  Setup Time [id number]   Final    Colony Count NO GROWTH   Final    Culture NO GROWTH   Final    Report Status 06/02/2011 FINAL   Final   MRSA PCR SCREENING     Status: Abnormal   Collection Time   06/02/11  3:09 PM      Component Value Range Status Comment   MRSA by PCR POSITIVE (*) NEGATIVE  Final   MRSA PCR SCREENING     Status: Normal   Collection Time   06/09/11  9:44 AM      Component Value Range Status Comment   MRSA by PCR NEGATIVE  NEGATIVE  Final     Anti-infectives     Start  Dose/Rate Route Frequency Ordered Stop   06/10/11 0900   ceFEPIme (MAXIPIME) 2 g in dextrose 5 % 50 mL IVPB        2 g 100 mL/hr over 30 Minutes Intravenous Every 24 hours 06/09/11 1507     06/10/11 0800   vancomycin (VANCOCIN) 1,750 mg in sodium chloride 0.9 % 500 mL IVPB        1,750 mg 250 mL/hr over 120 Minutes Intravenous Every 24 hours 06/09/11 0716     06/10/11 0800   Levofloxacin (LEVAQUIN) IVPB 250 mg        250 mg 50 mL/hr over 60 Minutes Intravenous Every 24 hours 06/09/11 0716     06/09/11 2000   ceFEPIme (MAXIPIME) 1 g in dextrose 5 % 50 mL IVPB  Status:  Discontinued        1 g 100 mL/hr over 30 Minutes Intravenous Every 12 hours 06/09/11 0719 06/09/11 1507   06/09/11 0730   levofloxacin (LEVAQUIN) IVPB 750 mg  Status:  Discontinued        750 mg 100 mL/hr over 90 Minutes Intravenous Every 24 hours 06/09/11 0719 06/09/11 0723   06/09/11  0715   vancomycin (VANCOCIN) 2,000 mg in sodium chloride 0.9 % 500 mL IVPB        2,000 mg 250 mL/hr over 120 Minutes Intravenous NOW 06/09/11 0716 06/09/11 1113   06/09/11 0700   ceFEPIme (MAXIPIME) 1 g in dextrose 5 % 50 mL IVPB        1 g 100 mL/hr over 30 Minutes Intravenous  Once 06/09/11 0645 06/09/11 0943   06/09/11 0700   levofloxacin (LEVAQUIN) IVPB 750 mg        750 mg 100 mL/hr over 90 Minutes Intravenous  Once 06/09/11 0645 06/09/11 1427          Assessment: 78yo male with HCAP, on day 2 Vanc, Cefepime, Levofloxacin.  Cr 2.43<---2.05<---1.78   Plan:  1.  D/C vancomycin for now and f/u AM labs, do not want to cause injury. Will re-dose if AM Scr okay 2.  Change levofloxacin to 500 mg q48h for better PAE killing for PNA 3.  Plan steady state Vancomycin trough Monday AM  [place] R. Bradi [place], PharmD [phone]

## 2011-06-10 NOTE — Progress Notes (Signed)
Patient ID: [name]  male  ZOX:[id number]    DOB: 1933/04/14    DOA: 06/09/2011  PCP: [name], MD, MD  Subjective: Shortness of breath improving  Objective: Weight change: -5.364 kg (-11 lb 13.2 oz)  Intake/Output Summary (Last 24 hours) at 06/10/11 1146 Last data filed at 06/10/11 0928  Gross per 24 hour  Intake   1358 ml  Output   1710 ml  Net   -352 ml   Blood pressure 116/68, pulse 69, temperature 97.8 F (36.6 C), temperature source Oral, resp. rate 18, height 5\' 11"  (1.803 m), weight 116.2 kg (256 lb 2.8 oz), SpO2 98.00%.  Physical Exam: General: Alert and awake, oriented x3, not in any acute distress. HEENT: anicteric sclera, pupils reactive to light and accommodation, EOMI CVS: S1-S2 clear, no murmur rubs or gallops Chest: clear to auscultation bilaterally, no wheezing, rales or rhonchi Abdomen: soft nontender, nondistended, normal bowel sounds, no organomegaly Extremities: no cyanosis, clubbing or edema noted bilaterally Neuro: Cranial nerves II-XII intact, no focal neurological deficits  Lab Results: Basic Metabolic Panel:  Lab 06/10/11 8119 06/09/11 0900 06/09/11 0543  NA 135 -- 134*  K 4.0 -- 3.7  CL 95* -- 97  CO2 29 -- 23  GLUCOSE 144* -- 236*  BUN 31* -- 25*  CREATININE 2.35* 2.03* --  CALCIUM 8.9 -- 9.4  MG -- -- --  PHOS -- -- --   Liver Function Tests:  Lab 06/09/11 0543  AST 29  ALT 15  ALKPHOS 99  BILITOT 0.9  PROT 7.7  ALBUMIN 3.7    Lab 06/09/11 0543  LIPASE 30  AMYLASE --   No results found for this basename: AMMONIA:2 in the last 168 hours CBC:  Lab 06/09/11 0900 06/09/11 0543 06/04/11 0520  WBC 18.3* 14.1* --  NEUTROABS -- -- 3.8  HGB 12.4* 12.8* --  HCT 37.7* 38.2* --  MCV 97.7 96.5 --  PLT 196 175 --   Cardiac Enzymes:  Lab 06/09/11 2138 06/09/11 1459 06/09/11 0900  CKTOTAL 39 43 43  CKMB 2.0 2.1 2.3  CKMBINDEX -- -- --  TROPONINI <0.30 <0.30 <0.30   BNP: No components found with this basename:  POCBNP:2 CBG:  Lab 06/10/11 1051 06/10/11 0608 06/09/11 2140 06/09/11 1603 06/09/11 1108  GLUCAP 211* 130* 134* 209* 231*     Micro Results: Recent Results (from the past 240 hour(s))  CULTURE, BLOOD (ROUTINE X 2)     Status: Normal   Collection Time   05/31/11  8:50 PM      Component Value Range Status Comment   Specimen Description BLOOD RIGHT HAND   Final    Special Requests BOTTLES DRAWN AEROBIC ONLY   Final    Culture  Setup Time [id number]   Final    Culture     Final    Value: STAPHYLOCOCCUS SPECIES (COAGULASE NEGATIVE)     Note: THE SIGNIFICANCE OF ISOLATING THIS ORGANISM FROM A SINGLE SET OF BLOOD CULTURES WHEN MULTIPLE SETS ARE DRAWN IS UNCERTAIN. PLEASE NOTIFY THE MICROBIOLOGY DEPARTMENT WITHIN ONE WEEK IF SPECIATION AND SENSITIVITIES ARE REQUIRED.     Note: Gram Stain Report Called to,Read Back By and Verified With: NEKA SHERARD @ 2308 ON 06/01/11 BY GOLLD   Report Status 06/03/2011 FINAL   Final   CULTURE, BLOOD (ROUTINE X 2)     Status: Normal   Collection Time   05/31/11  8:56 PM      Component Value Range Status Comment   Specimen  Description BLOOD LEFT ANTECUBITAL   Final    Special Requests BOTTLES DRAWN AEROBIC AND ANAEROBIC [facility]   Final    Culture  Setup Time [id number]   Final    Culture NO GROWTH 5 DAYS   Final    Report Status 06/07/2011 FINAL   Final   URINE CULTURE     Status: Normal   Collection Time   05/31/11  9:04 PM      Component Value Range Status Comment   Specimen Description URINE, RANDOM   Final    Special Requests NONE   Final    Culture  Setup Time [id number]   Final    Colony Count NO GROWTH   Final    Culture NO GROWTH   Final    Report Status 06/02/2011 FINAL   Final   MRSA PCR SCREENING     Status: Abnormal   Collection Time   06/02/11  3:09 PM      Component Value Range Status Comment   MRSA by PCR POSITIVE (*) NEGATIVE  Final   MRSA PCR SCREENING     Status: Normal   Collection Time   06/09/11  9:44 AM      Component Value Range  Status Comment   MRSA by PCR NEGATIVE  NEGATIVE  Final     Studies/Results: Dg Chest Port 1 View  06/09/2011  *RADIOLOGY REPORT*  Clinical Data: Shortness of breath.  PORTABLE CHEST - 1 VIEW  Comparison: Chest radiograph performed 05/31/2011  Findings: The lungs are well-aerated.  Mild bibasilar airspace opacities raise concern for pneumonia.  Alternatively, interstitial edema could have a similar appearance.  There is no evidence of pleural effusion or pneumothorax.  The cardiomediastinal silhouette is borderline enlarged; the patient is status post median sternotomy, with evidence of prior CABG.  A fractured superiormost sternal wire is again noted.  No acute osseous abnormalities are seen.  IMPRESSION:  1.  Mild bibasilar airspace opacities raise concern for pneumonia; alternatively, interstitial edema could have a similar appearance. 2.  Borderline cardiomegaly.  Original Report Authenticated By: Tonia Ghent, M.D.   Dg Chest Port 1 View  05/31/2011  *RADIOLOGY REPORT*  Clinical Data: Shortness of breath.  PORTABLE CHEST - 1 VIEW  Comparison: 09/01/2010.  Findings: The heart is enlarged but stable.  Stable surgical changes from bypass surgery.  There is vascular congestion and mild interstitial edema suggesting CHF.  No definite pleural effusions. Stable elevation of the right hemidiaphragm.  IMPRESSION: Cardiac enlargement with vascular congestion and mild interstitial edema.  Original Report Authenticated By: P. Loralie Champagne, M.D.   Dg Abd Portable 1v  06/09/2011  *RADIOLOGY REPORT*  Clinical Data: Abdominal pain and abdominal distension; shortness of breath.  PORTABLE ABDOMEN - 1 VIEW  Comparison: Lumbar spine radiographs performed 02/13/2011, and CT of the abdomen and pelvis performed 01/22/2009  Findings: The visualized bowel gas pattern is nonspecific.  The colon is somewhat distended with air, though difficult to fully assess; scattered stool is seen within the colon.  No abnormal dilatation  of small bowel loops is seen to suggest small bowel obstruction.  No free intra-abdominal air is identified, though evaluation for free air is limited on supine views.  Mild degenerative change is noted along the lumbar spine.  The patient is status post median sternotomy.  Clips are noted within the right upper quadrant, reflecting prior cholecystectomy.  The visualized lung bases are essentially clear.  IMPRESSION: Nonspecific bowel gas pattern; colon distended with air, though difficult to  fully assess.  Moderate amount of stool noted in the colon.  No evidence for bowel obstruction; no free intra-abdominal air seen.  Original Report Authenticated By: Tonia Ghent, M.D.    Medications: Scheduled Meds:   . albuterol  2.5 mg Nebulization Q6H  . allopurinol  150 mg Oral QHS  . antiseptic oral rinse  15 mL Mouth Rinse BID  . aspirin EC  81 mg Oral Daily  . benzonatate  100 mg Oral TID  . ceFEPime (MAXIPIME) IV  2 g Intravenous Q24H  . cholecalciferol  1,000 Units Oral Daily  . clopidogrel  75 mg Oral QHS  . colesevelam  1,875 mg Oral QHS  . enoxaparin  40 mg Subcutaneous Q24H  . furosemide  40 mg Oral BID  . gabapentin  300 mg Oral TID  . insulin aspart  0-15 Units Subcutaneous TID WC  . insulin aspart  0-5 Units Subcutaneous QHS  . insulin glargine  50 Units Subcutaneous QHS  . ipratropium  0.5 mg Nebulization Q6H  . levofloxacin (LEVAQUIN) IV  250 mg Intravenous Q24H  . levofloxacin (LEVAQUIN) IV  750 mg Intravenous Once  . levothyroxine  150 mcg Oral QHS  . linagliptin  5 mg Oral Daily  . metoprolol tartrate  25 mg Oral BID  . pantoprazole  40 mg Oral Q1200  . simvastatin  40 mg Oral QHS  . sodium chloride  3 mL Intravenous Q12H  . vancomycin  1,750 mg Intravenous Q24H  . DISCONTD: ceFEPime (MAXIPIME) IV  1 g Intravenous Q12H  . DISCONTD: furosemide  40 mg Intravenous Q12H   Continuous Infusions:    Assessment/Plan: Principal Problem:  .Dyspnea: Multifactorial secondary to  acute on chronic diastolic CHF, HCAP, obesity hypoventilation, OSA, generalized debility   .Healthcare-associated pneumonia: WBC 14.1 (up on previous DC, was 7.2)  - Continue vancomycin, levofloxacin, cefepime per HCAP, blood cultures, urine Legionella antigen, urine strep antigen  - Placed on albuterol, Atrovent nebulizers and antitussives   . chronic Back pain: Continue Neurontin and Lortab when necessary   .Diastolic CHF, acute on chronic:  - 2-D echo done during the previous admission, EF of 60-65% with grade 1 diastolic dysfunction, will not repeat  - Continue aspirin, beta blocker, statin, Plavix - Placed on Lasix 40 mg PO BID, CHF protocol  - Cardiology consulted, [facility] recommendations appreciated  .DM (diabetes mellitus)  - Continue Lantus and place on sliding scale insulin, hold oral hypoglycemics   .Hypothyroidism:  - TSH 10.34.  Will increase Synthroid to 175 MCG daily   .HTN (hypertension)  - Continue beta blocker and Lasix   .COPD (chronic obstructive pulmonary disease): per #2   .Obesity: Patient was counseled on weight control and low salt diet   DVT prophylaxis: TED hoses and Lovenox   CODE STATUS: Full code    LOS: 1 day   Delmer Kowalski M.D. [facility] 06/10/2011, 11:46 AM Pager: [phone]

## 2011-06-10 NOTE — Progress Notes (Signed)
CARDIAC REHAB PHASE I   PRE:  Rate/Rhythm: 64 SR  BP:  Supine:   Sitting: 116/68  Standing:    SaO2: 93 RA  MODE:  Ambulation: 350 ft   POST:  Rate/Rhythem: 68  BP:  Supine:   Sitting: 130/66  Standing:    SaO2: 95 RA  [name] [employer] received and appreciated. Chart reviewed. Pt ambulated 350 ft assist x 1 with rolling walker and IV pole. Tolerated very well, no complaints. No rest breaks or reported dyspnea. Returned to bedside. VSS. Will follow up.  Electronically signed by Harriett Sine MS on Saturday June 10 2011 at 1017

## 2011-06-11 LAB — BASIC METABOLIC PANEL
BUN: 35 mg/dL — ABNORMAL HIGH (ref 6–23)
Chloride: 95 mEq/L — ABNORMAL LOW (ref 96–112)
GFR calc Af Amer: 29 mL/min — ABNORMAL LOW (ref >90)
GFR calc non Af Amer: 25 mL/min — ABNORMAL LOW (ref >90)
Glucose, Bld: 226 mg/dL — ABNORMAL HIGH (ref 70–99)
Potassium: 4.4 mEq/L (ref 3.5–5.1)
Sodium: 135 mEq/L (ref 135–145)

## 2011-06-11 LAB — GLUCOSE, CAPILLARY
Glucose-Capillary: 224 mg/dL — ABNORMAL HIGH (ref 70–99)
Glucose-Capillary: 280 mg/dL — ABNORMAL HIGH (ref 70–99)

## 2011-06-11 LAB — LEGIONELLA ANTIGEN, URINE

## 2011-06-11 LAB — CBC
MCH: 32.1 pg (ref 26.0–34.0)
MCHC: 33.2 g/dL (ref 30.0–36.0)
Platelets: 209 10*3/uL (ref 150–400)
RDW: 15.4 % (ref 11.5–15.5)

## 2011-06-11 MED ORDER — INSULIN GLARGINE 100 UNIT/ML ~~LOC~~ SOLN
60.0000 [IU] | Freq: Every day | SUBCUTANEOUS | Status: DC
  Administered 2011-06-11: 60 [IU] via SUBCUTANEOUS

## 2011-06-11 NOTE — Progress Notes (Signed)
[facility]  DAILY PROGRESS NOTE   Subjective:  Breathing much better .Marland Kitchen Creatinine has stabilized around 2.3.  Objective:  Temp:  [97.5 F (36.4 C)-98 F (36.7 C)] 97.9 F (36.6 C) (04/14 0508) Pulse Rate:  [60-79] 79  (04/14 0508) Resp:  [18-19] 18  (04/14 0508) BP: (113-132)/(45-75) 132/75 mmHg (04/14 0508) SpO2:  [96 %-100 %] 98 % (04/14 0917) Weight:  [117.119 kg (258 lb 3.2 oz)] 117.119 kg (258 lb 3.2 oz) (04/14 0509) Weight change: 0.919 kg (2 lb 0.4 oz)  Intake/Output from previous day: 04/13 0701 - 04/14 0700 In: 1250 [P.O.:1200; IV Piggyback:50] Out: 2650 [Urine:2650]  Intake/Output from this shift: Total I/O In: 360 [P.O.:360] Out: 800 [Urine:800]  Medications: Current Facility-Administered Medications  Medication Dose Route Frequency Provider Last Rate Last Dose  . 0.9 %  sodium chloride infusion  250 mL Intravenous PRN [name], MD      . acetaminophen (TYLENOL) tablet 650 mg  650 mg Oral Q4H PRN Ripudeep K Rai, MD      . albuterol (PROVENTIL) (5 MG/ML) 0.5% nebulizer solution 2.5 mg  2.5 mg Nebulization Q6H Ripudeep K Rai, MD   2.5 mg at 06/11/11 0916  . allopurinol (ZYLOPRIM) tablet 150 mg  150 mg Oral QHS Ripudeep K Rai, MD   150 mg at 06/10/11 2302  . antiseptic oral rinse (BIOTENE) solution 15 mL  15 mL Mouth Rinse BID [name], MD   15 mL at 06/10/11 0906  . aspirin EC tablet 81 mg  81 mg Oral Daily [name], MD   81 mg at 06/10/11 0957  . benzonatate (TESSALON) capsule 100 mg  100 mg Oral TID [name], MD   100 mg at 06/10/11 2304  . ceFEPIme (MAXIPIME) 2 g in dextrose 5 % 50 mL IVPB  2 g Intravenous Q24H Kendra P Hiatt, PHARMD   2 g at 06/10/11 1000  . cholecalciferol (VITAMIN D) tablet 1,000 Units  1,000 Units Oral Daily [name], MD   1,000 Units at 06/10/11 0957  . clopidogrel (PLAVIX) tablet 75 mg  75 mg Oral QHS [name], MD   75 mg at 06/10/11 2304  . colesevelam [facility]) tablet  1,875 mg  1,875 mg Oral QHS [name], MD   1,875 mg at 06/10/11 2257  . enoxaparin (LOVENOX) injection 40 mg  40 mg Subcutaneous Q24H Ripudeep K Rai, MD   40 mg at 06/10/11 0957  . furosemide (LASIX) tablet 40 mg  40 mg Oral BID Chrystie Nose, MD   40 mg at 06/10/11 1736  . gabapentin (NEURONTIN) capsule 300 mg  300 mg Oral TID [name], MD   300 mg at 06/10/11 2303  . guaiFENesin (ROBITUSSIN) 100 MG/5ML solution 200 mg  10 mL Oral Q4H PRN Ripudeep K Rai, MD      . insulin aspart (novoLOG) injection 0-15 Units  0-15 Units Subcutaneous TID WC [name], MD   5 Units at 06/11/11 0701  . insulin aspart (novoLOG) injection 0-5 Units  0-5 Units Subcutaneous QHS [name], MD   3 Units at 06/10/11 2306  . insulin glargine (LANTUS) injection 50 Units  50 Units Subcutaneous QHS [name], MD   50 Units at 06/10/11 2305  . ipratropium (ATROVENT) nebulizer solution 0.5 mg  0.5 mg Nebulization Q6H Ripudeep K Rai, MD   0.5 mg at 06/11/11 0916  . levofloxacin (LEVAQUIN) IVPB 500  mg  500 mg Intravenous Q48H Ripudeep K Rai, MD      . levothyroxine (SYNTHROID, LEVOTHROID) tablet 175 mcg  175 mcg Oral QHS [name], MD   175 mcg at 06/10/11 2302  . linagliptin (TRADJENTA) tablet 5 mg  5 mg Oral Daily [name], MD   5 mg at 06/10/11 0958  . menthol-cetylpyridinium (CEPACOL) lozenge 3 mg  1 lozenge Oral PRN Ripudeep K Rai, MD      . metoprolol tartrate (LOPRESSOR) tablet 25 mg  25 mg Oral BID [name], MD   25 mg at 06/10/11 2304  . ondansetron (ZOFRAN) injection 4 mg  4 mg Intravenous Q6H PRN Ripudeep K Rai, MD      . pantoprazole (PROTONIX) EC tablet 40 mg  40 mg Oral Q1200 [name], MD   40 mg at 06/10/11 1335  . simvastatin (ZOCOR) tablet 40 mg  40 mg Oral QHS [name], MD   40 mg at 06/10/11 2303  . sodium chloride 0.9 % injection 3 mL  3 mL Intravenous Q12H Ripudeep K Rai, MD   3 mL at 06/10/11 2100  . sodium chloride 0.9 % injection 3 mL  3 mL  Intravenous PRN Ripudeep K Rai, MD      . DISCONTD: Levofloxacin (LEVAQUIN) IVPB 250 mg  250 mg Intravenous Q24H Celene Kras, MD   250 mg at 06/10/11 0902  . DISCONTD: levothyroxine (SYNTHROID, LEVOTHROID) tablet 150 mcg  150 mcg Oral QHS [name], MD   150 mcg at 06/09/11 2215  . DISCONTD: vancomycin (VANCOCIN) 1,750 mg in sodium chloride 0.9 % 500 mL IVPB  1,750 mg Intravenous Q24H Celene Kras, MD   1,750 mg at 06/10/11 1005    Physical Exam: General appearance: alert and no distress Neck: no adenopathy, no carotid bruit, no JVD, supple, symmetrical, trachea midline and thyroid not enlarged, symmetric, no tenderness/mass/nodules Lungs: clear to auscultation bilaterally Heart: regular rate and rhythm, S1, S2 normal, no murmur, click, rub or gallop Abdomen: soft, non-tender; bowel sounds normal; no masses,  no organomegaly Extremities: extremities normal, atraumatic, no cyanosis or edema  Lab Results: Results for orders placed during the hospital encounter of 06/09/11 (from the past 48 hour(s))  GLUCOSE, CAPILLARY     Status: Abnormal   Collection Time   06/09/11 11:08 AM      Component Value Range Comment   Glucose-Capillary 231 (*) 70 - 99 (mg/dL)    Comment 1 Notify RN     STREP PNEUMONIAE URINARY ANTIGEN     Status: Normal   Collection Time   06/09/11  1:16 PM      Component Value Range Comment   Strep Pneumo Urinary Antigen NEGATIVE  NEGATIVE    CARDIAC PANEL(CRET KIN+CKTOT+MB+TROPI)     Status: Normal   Collection Time   06/09/11  2:59 PM      Component Value Range Comment   Total CK 43  7 - 232 (U/L)    CK, MB 2.1  0.3 - 4.0 (ng/mL)    Troponin I <0.30  <0.30 (ng/mL)    Relative Index RELATIVE INDEX IS INVALID  0.0 - 2.5    GLUCOSE, CAPILLARY     Status: Abnormal   Collection Time   06/09/11  4:03 PM      Component Value Range Comment   Glucose-Capillary 209 (*) 70 - 99 (mg/dL)    Comment 1 Notify RN     CARDIAC PANEL(CRET KIN+CKTOT+MB+TROPI)  Status: Normal    Collection Time   06/09/11  9:38 PM      Component Value Range Comment   Total CK 39  7 - 232 (U/L)    CK, MB 2.0  0.3 - 4.0 (ng/mL)    Troponin I <0.30  <0.30 (ng/mL)    Relative Index RELATIVE INDEX IS INVALID  0.0 - 2.5    GLUCOSE, CAPILLARY     Status: Abnormal   Collection Time   06/09/11  9:40 PM      Component Value Range Comment   Glucose-Capillary 134 (*) 70 - 99 (mg/dL)    Comment 1 Notify RN      Comment 2 Documented in Chart     BASIC METABOLIC PANEL     Status: Abnormal   Collection Time   06/10/11  6:00 AM      Component Value Range Comment   Sodium 135  135 - 145 (mEq/L)    Potassium 4.0  3.5 - 5.1 (mEq/L)    Chloride 95 (*) 96 - 112 (mEq/L)    CO2 29  19 - 32 (mEq/L)    Glucose, Bld 144 (*) 70 - 99 (mg/dL)    BUN 31 (*) 6 - 23 (mg/dL)    Creatinine, Ser 3.08 (*) 0.50 - 1.35 (mg/dL)    Calcium 8.9  8.4 - 10.5 (mg/dL)    GFR calc non Af Amer 25 (*) >90 (mL/min)    GFR calc Af Amer 29 (*) >90 (mL/min)   PRO B NATRIURETIC PEPTIDE     Status: Abnormal   Collection Time   06/10/11  6:00 AM      Component Value Range Comment   Pro B Natriuretic peptide (BNP) 1059.0 (*) 0 - 450 (pg/mL)   GLUCOSE, CAPILLARY     Status: Abnormal   Collection Time   06/10/11  6:08 AM      Component Value Range Comment   Glucose-Capillary 130 (*) 70 - 99 (mg/dL)   GLUCOSE, CAPILLARY     Status: Abnormal   Collection Time   06/10/11 10:51 AM      Component Value Range Comment   Glucose-Capillary 211 (*) 70 - 99 (mg/dL)   GLUCOSE, CAPILLARY     Status: Abnormal   Collection Time   06/10/11  4:11 PM      Component Value Range Comment   Glucose-Capillary 213 (*) 70 - 99 (mg/dL)   GLUCOSE, CAPILLARY     Status: Abnormal   Collection Time   06/10/11  9:32 PM      Component Value Range Comment   Glucose-Capillary 276 (*) 70 - 99 (mg/dL)    Comment 1 Notify RN     BASIC METABOLIC PANEL     Status: Abnormal   Collection Time   06/11/11  5:20 AM      Component Value Range Comment   Sodium  135  135 - 145 (mEq/L)    Potassium 4.4  3.5 - 5.1 (mEq/L)    Chloride 95 (*) 96 - 112 (mEq/L)    CO2 29  19 - 32 (mEq/L)    Glucose, Bld 226 (*) 70 - 99 (mg/dL)    BUN 35 (*) 6 - 23 (mg/dL)    Creatinine, Ser 6.57 (*) 0.50 - 1.35 (mg/dL)    Calcium 9.4  8.4 - 10.5 (mg/dL)    GFR calc non Af Amer 25 (*) >90 (mL/min)    GFR calc Af Amer 29 (*) >90 (mL/min)   GLUCOSE, CAPILLARY  Status: Abnormal   Collection Time   06/11/11  5:58 AM      Component Value Range Comment   Glucose-Capillary 224 (*) 70 - 99 (mg/dL)    Comment 1 Notify RN       Imaging: No results found.  Assessment:  1. Principal Problem: 2.  *Dyspnea 3. Active Problems: 4.  Back pain, chronic, prior back surg 5.  Diastolic CHF, acute on chronic:  Grade one. EF 60-65% 6.  DM (diabetes mellitus) 7.  Hypothyroidism 8.  HTN (hypertension) 9.  COPD (chronic obstructive pulmonary disease) 10.  CAD, CABG '98, cath '08, low risk Myoview Feb 2012 11.  Obesity 12.  Healthcare-associated pneumonia, just discharged 4/7 13.  Sleep apnea, on C-pap 14.  PVD, Lt Vertebral artery PTA in '08, known moderate ICA disease 15.  Acute on chronic renal insufficiency 16.   Plan:  1. Heart failure has clinically improved .Marland Kitchen Much more out than in. BNP is somewhat unreliable in him, but weight has come down nicely.  I think we have the correct dose of diuretics.  We will need to accept this creatinine as his baseline. Clinically, probably okay for discharge tomorrow. He is anxious about this, but understands he is better and needs to be at home.  Time Spent Directly with Patient:  15 minutes  Length of Stay:  LOS: 2 days   Chrystie Nose, MD, [facility] Attending Cardiologist [facility]  Susane Bey C 06/11/2011, 9:57 AM

## 2011-06-11 NOTE — Progress Notes (Signed)
Patient ID: [name]  male  ZOX:[id number]    DOB: [birth year]    DOA: 06/09/2011  PCP: [name], MD, MD  Subjective: Shortness of breath improving, ambulating in the hallway with a walker without any difficulty completing full sentences, laughing and joking in the room with the staff  Objective: Weight change: 0.919 kg (2 lb 0.4 oz)  Intake/Output Summary (Last 24 hours) at 06/11/11 1131 Last data filed at 06/11/11 0956  Gross per 24 hour  Intake    840 ml  Output   3300 ml  Net  -2460 ml   Blood pressure 132/75, pulse 79, temperature 97.9 F (36.6 C), temperature source Oral, resp. rate 18, height 5\' 11"  (1.803 m), weight 117.119 kg (258 lb 3.2 oz), SpO2 98.00%.  Physical Exam: General: Alert and awake, oriented x3, not in any acute distress. HEENT: anicteric sclera, pupils reactive to light and accommodation, EOMI CVS: S1-S2 clear, no murmur rubs or gallops Chest: clear to auscultation bilaterally, no wheezing, rales or rhonchi Abdomen: soft nontender, nondistended, normal bowel sounds, no organomegaly Extremities: no cyanosis, clubbing or edema noted bilaterally Neuro: Cranial nerves II-XII intact, no focal neurological deficits  Lab Results: Basic Metabolic Panel:  Lab 06/11/11 8119 06/10/11 0600  NA 135 135  K 4.4 4.0  CL 95* 95*  CO2 29 29  GLUCOSE 226* 144*  BUN 35* 31*  CREATININE 2.39* 2.35*  CALCIUM 9.4 8.9  MG -- --  PHOS -- --   Liver Function Tests:  Lab 06/09/11 0543  AST 29  ALT 15  ALKPHOS 99  BILITOT 0.9  PROT 7.7  ALBUMIN 3.7    Lab 06/09/11 0543  LIPASE 30  AMYLASE --   No results found for this basename: AMMONIA:2 in the last 168 hours CBC:  Lab 06/09/11 0900 06/09/11 0543  WBC 18.3* 14.1*  NEUTROABS -- --  HGB 12.4* 12.8*  HCT 37.7* 38.2*  MCV 97.7 96.5  PLT 196 175   Cardiac Enzymes:  Lab 06/09/11 2138 06/09/11 1459 06/09/11 0900  CKTOTAL 39 43 43  CKMB 2.0 2.1 2.3  CKMBINDEX -- -- --  TROPONINI <0.30 <0.30  <0.30   BNP: No components found with this basename: POCBNP:2 CBG:  Lab 06/11/11 1100 06/11/11 0558 06/10/11 2132 06/10/11 1611 06/10/11 1051  GLUCAP 205* 224* 276* 213* 211*     Micro Results: Recent Results (from the past 240 hour(s))  MRSA PCR SCREENING     Status: Abnormal   Collection Time   06/02/11  3:09 PM      Component Value Range Status Comment   MRSA by PCR POSITIVE (*) NEGATIVE  Final   MRSA PCR SCREENING     Status: Normal   Collection Time   06/09/11  9:44 AM      Component Value Range Status Comment   MRSA by PCR NEGATIVE  NEGATIVE  Final     Studies/Results: Dg Chest Port 1 View  06/09/2011  *RADIOLOGY REPORT*  Clinical Data: Shortness of breath.  PORTABLE CHEST - 1 VIEW  Comparison: Chest radiograph performed 05/31/2011  Findings: The lungs are well-aerated.  Mild bibasilar airspace opacities raise concern for pneumonia.  Alternatively, interstitial edema could have a similar appearance.  There is no evidence of pleural effusion or pneumothorax.  The cardiomediastinal silhouette is borderline enlarged; the patient is status post median sternotomy, with evidence of prior CABG.  A fractured superiormost sternal wire is again noted.  No acute osseous abnormalities are seen.  IMPRESSION:  1.  Mild bibasilar airspace opacities raise concern for pneumonia; alternatively, interstitial edema could have a similar appearance. 2.  Borderline cardiomegaly.  Original Report Authenticated By: Tonia Ghent, M.D.   Dg Chest Port 1 View  05/31/2011  *RADIOLOGY REPORT*  Clinical Data: Shortness of breath.  PORTABLE CHEST - 1 VIEW  Comparison: 09/01/2010.  Findings: The heart is enlarged but stable.  Stable surgical changes from bypass surgery.  There is vascular congestion and mild interstitial edema suggesting CHF.  No definite pleural effusions. Stable elevation of the right hemidiaphragm.  IMPRESSION: Cardiac enlargement with vascular congestion and mild interstitial edema.  Original  Report Authenticated By: P. Loralie Champagne, M.D.   Dg Abd Portable 1v  06/09/2011  *RADIOLOGY REPORT*  Clinical Data: Abdominal pain and abdominal distension; shortness of breath.  PORTABLE ABDOMEN - 1 VIEW  Comparison: Lumbar spine radiographs performed 02/13/2011, and CT of the abdomen and pelvis performed 01/22/2009  Findings: The visualized bowel gas pattern is nonspecific.  The colon is somewhat distended with air, though difficult to fully assess; scattered stool is seen within the colon.  No abnormal dilatation of small bowel loops is seen to suggest small bowel obstruction.  No free intra-abdominal air is identified, though evaluation for free air is limited on supine views.  Mild degenerative change is noted along the lumbar spine.  The patient is status post median sternotomy.  Clips are noted within the right upper quadrant, reflecting prior cholecystectomy.  The visualized lung bases are essentially clear.  IMPRESSION: Nonspecific bowel gas pattern; colon distended with air, though difficult to fully assess.  Moderate amount of stool noted in the colon.  No evidence for bowel obstruction; no free intra-abdominal air seen.  Original Report Authenticated By: Tonia Ghent, M.D.    Medications: Scheduled Meds:    . albuterol  2.5 mg Nebulization Q6H  . allopurinol  150 mg Oral QHS  . antiseptic oral rinse  15 mL Mouth Rinse BID  . aspirin EC  81 mg Oral Daily  . benzonatate  100 mg Oral TID  . ceFEPime (MAXIPIME) IV  2 g Intravenous Q24H  . cholecalciferol  1,000 Units Oral Daily  . clopidogrel  75 mg Oral QHS  . colesevelam  1,875 mg Oral QHS  . enoxaparin  40 mg Subcutaneous Q24H  . furosemide  40 mg Oral BID  . gabapentin  300 mg Oral TID  . insulin aspart  0-15 Units Subcutaneous TID WC  . insulin aspart  0-5 Units Subcutaneous QHS  . insulin glargine  50 Units Subcutaneous QHS  . ipratropium  0.5 mg Nebulization Q6H  . levofloxacin (LEVAQUIN) IV  500 mg Intravenous Q48H  .  levothyroxine  175 mcg Oral QHS  . linagliptin  5 mg Oral Daily  . metoprolol tartrate  25 mg Oral BID  . pantoprazole  40 mg Oral Q1200  . simvastatin  40 mg Oral QHS  . sodium chloride  3 mL Intravenous Q12H  . DISCONTD: levofloxacin (LEVAQUIN) IV  250 mg Intravenous Q24H  . DISCONTD: levothyroxine  150 mcg Oral QHS  . DISCONTD: vancomycin  1,750 mg Intravenous Q24H   Continuous Infusions:    Assessment/Plan: Principal Problem:  .Dyspnea: Multifactorial secondary to acute on chronic diastolic CHF, HCAP, obesity hypoventilation, OSA, generalized debility: IMPROVING   .Healthcare-associated pneumonia:  - Continue vancomycin, levofloxacin, cefepime per HCAP, blood cultures negative so far - Continue albuterol, Atrovent nebulizers and antitussives  - Follow CBC, patient has been afebrile, symptoms improving  . chronic Back  pain: Continue Neurontin and Lortab when necessary   .Diastolic CHF, acute on chronic: BNP 1059 - 2-D echo done during the previous admission, EF of 60-65% with grade 1 diastolic dysfunction, will not repeat  - Continue aspirin, beta blocker, statin, Plavix, PO lasix - Cardiology [facility] recommendations appreciated, okay for dc tomorrow from cardiology standpoint  .DM (diabetes mellitus) not well controlled - Increase Lantus to 60units daily, continue sliding scale insulin and tradjenta   .Hypothyroidism:  - TSH 10.34, increased Synthroid to 175 MCG daily   .HTN (hypertension)  - Continue beta blocker and Lasix   .COPD (chronic obstructive pulmonary disease): per #2   .Obesity: Patient was counseled on weight control and low salt diet   DVT prophylaxis: TED hoses and Lovenox   CODE STATUS: Full code  Disposition: Hopefully DC home tomorrow, will arrange home health PT, or RN for followup, patient is doing well, laughing and joking and ambulating without difficulty, however skeptical about discharge tomorrow. Appreciate cardiology also reassuring the  patient, will set up resources at home the case manager assistance tomorrow.   LOS: 2 days   Kinda Pottle M.D. [facility] 06/11/2011, 11:31 AM Pager: [phone]

## 2011-06-11 NOTE — Progress Notes (Signed)
Subjective:  Ambulating   Objective:  Vital Signs in the last 24 hours: Temp:  [97.5 F (36.4 C)-98 F (36.7 C)] 97.9 F (36.6 C) (04/14 0508) Pulse Rate:  [60-79] 79  (04/14 0508) Resp:  [18-19] 18  (04/14 0508) BP: (113-132)/(45-75) 132/75 mmHg (04/14 0508) SpO2:  [96 %-100 %] 98 % (04/14 0917) Weight:  [117.119 kg (258 lb 3.2 oz)] 117.119 kg (258 lb 3.2 oz) (04/14 0509)  Intake/Output from previous day:  Intake/Output Summary (Last 24 hours) at 06/11/11 0950 Last data filed at 06/11/11 0733  Gross per 24 hour  Intake    530 ml  Output   2900 ml  Net  -2370 ml    Physical Exam: General appearance: alert, cooperative and no distress Lungs: clear to auscultation bilaterally Heart: regular rate and rhythm   Rate: 78  Rhythm: normal sinus rhythm  Lab Results:  Basename 06/09/11 0900 06/09/11 0543  WBC 18.3* 14.1*  HGB 12.4* 12.8*  PLT 196 175    Basename 06/11/11 0520 06/10/11 0600  NA 135 135  K 4.4 4.0  CL 95* 95*  CO2 29 29  GLUCOSE 226* 144*  BUN 35* 31*  CREATININE 2.39* 2.35*    Basename 06/09/11 2138 06/09/11 1459  TROPONINI <0.30 <0.30   Hepatic Function Panel  Basename 06/09/11 0543  PROT 7.7  ALBUMIN 3.7  AST 29  ALT 15  ALKPHOS 99  BILITOT 0.9  BILIDIR --  IBILI --   No results found for this basename: CHOL in the last 72 hours No results found for this basename: INR in the last 72 hours  Imaging: Imaging results have been reviewed  Cardiac Studies:  Assessment/Plan:   Principal Problem:  *Dyspnea Active Problems:  Diastolic CHF, acute on chronic:  Grade one. EF 60-65%  COPD (chronic obstructive pulmonary disease)  CAD, CABG '98, cath '08, low risk Myoview Feb 2012  Healthcare-associated pneumonia, just discharged 4/7  Acute on chronic renal insufficiency  Back pain, chronic, prior back surg  DM (diabetes mellitus)  Hypothyroidism  HTN (hypertension)  Obesity  Sleep apnea, on C-pap  PVD, Lt Vertebral artery PTA in '08,  known moderate ICA disease   Plan- Not sure what to make of labile BNP. He has diuresed. MD to see.   Corine Shelter PA-C 06/11/2011, 9:50 AM

## 2011-06-11 NOTE — Progress Notes (Signed)
Spoke with patient regarding cpap.  Pt will place it on himself later when ready.  Advised pt that RT available all night should he need assistance.

## 2011-06-12 LAB — CBC
MCHC: 32.8 g/dL (ref 30.0–36.0)
Platelets: 219 10*3/uL (ref 150–400)
RDW: 15 % (ref 11.5–15.5)
WBC: 10.6 10*3/uL — ABNORMAL HIGH (ref 4.0–10.5)

## 2011-06-12 LAB — BASIC METABOLIC PANEL
Calcium: 9.6 mg/dL (ref 8.4–10.5)
Creatinine, Ser: 2.13 mg/dL — ABNORMAL HIGH (ref 0.50–1.35)
GFR calc Af Amer: 33 mL/min — ABNORMAL LOW (ref >90)

## 2011-06-12 LAB — GLUCOSE, CAPILLARY: Glucose-Capillary: 268 mg/dL — ABNORMAL HIGH (ref 70–99)

## 2011-06-12 MED ORDER — VANCOMYCIN HCL 1000 MG IV SOLR: 1500.0000 mg | INTRAVENOUS | Status: DC

## 2011-06-12 MED ORDER — LEVOFLOXACIN 500 MG PO TABS: 500.0000 mg | ORAL_TABLET | ORAL | Status: AC

## 2011-06-12 MED ORDER — ALBUTEROL SULFATE (5 MG/ML) 0.5% IN NEBU: 2.5000 mg | INHALATION_SOLUTION | Freq: Two times a day (BID) | RESPIRATORY_TRACT | Status: DC

## 2011-06-12 MED ORDER — VANCOMYCIN HCL 1000 MG IV SOLR
1500.0000 mg | INTRAVENOUS | Status: DC
  Administered 2011-06-12: 1500 mg via INTRAVENOUS
  Filled 2011-06-12: qty 1500

## 2011-06-12 MED ORDER — ALBUTEROL SULFATE HFA 108 (90 BASE) MCG/ACT IN AERS: 2.0000 | INHALATION_SPRAY | Freq: Four times a day (QID) | RESPIRATORY_TRACT | Status: DC | PRN

## 2011-06-12 MED ORDER — BENZONATATE 100 MG PO CAPS: 100.0000 mg | ORAL_CAPSULE | Freq: Three times a day (TID) | ORAL | Status: AC

## 2011-06-12 MED ORDER — LEVOTHYROXINE SODIUM 175 MCG PO TABS: 175.0000 ug | ORAL_TABLET | Freq: Every day | ORAL | Status: DC

## 2011-06-12 MED ORDER — VANCOMYCIN HCL 1000 MG IV SOLR
2500.0000 mg | Freq: Once | INTRAVENOUS | Status: DC
  Filled 2011-06-12: qty 2500

## 2011-06-12 MED ORDER — FUROSEMIDE 20 MG PO TABS: 40.0000 mg | ORAL_TABLET | Freq: Two times a day (BID) | ORAL | Status: DC

## 2011-06-12 MED ORDER — GUAIFENESIN 100 MG/5ML PO SOLN: 10.0000 mL | ORAL | Status: DC | PRN

## 2011-06-12 NOTE — Progress Notes (Signed)
CARDIAC REHAB PHASE I   PRE:  Rate/Rhythm: 68 SR    BP: sitting 136/70    SaO2: 97 RA  MODE:  Ambulation: 460 ft   POST:  Rate/Rhythm: 86    BP: sitting 150/62     SaO2: 95 RA  Tolerated well. Reminded pt to stand tall and stay close to RW in turns. VSS. Will give HF booklet.  [id number]  Harriet Masson CES, ACSM

## 2011-06-12 NOTE — Progress Notes (Signed)
IV d/c'ed. Tele d/c'ed. D/C instructions and medications discussed with pt and pt's family member. All questions answered. Pt states understanding. Pt d/c'ed home with tub chair, nebs, and HH. Pt escorted out via w/c with staff. Pt d/c'ed to home.

## 2011-06-12 NOTE — Progress Notes (Signed)
Subjective:  Up with PT, ready to go home  Objective:  Vital Signs in the last 24 hours: Temp:  [97.4 F (36.3 C)-98.7 F (37.1 C)] 97.4 F (36.3 C) (04/15 0435) Pulse Rate:  [8-79] 61  (04/15 0823) Resp:  [18] 18  (04/15 0435) BP: (127-133)/(58-68) 127/62 mmHg (04/15 0435) SpO2:  [94 %-98 %] 94 % (04/15 0845) Weight:  [116.5 kg (256 lb 13.4 oz)] 116.5 kg (256 lb 13.4 oz) (04/15 0435)  Intake/Output from previous day:  Intake/Output Summary (Last 24 hours) at 06/12/11 0930 Last data filed at 06/12/11 0724  Gross per 24 hour  Intake   1266 ml  Output   3150 ml  Net  -1884 ml    Physical Exam: General appearance: alert, cooperative, no distress and moderately obese Lungs: decreased breath sounds Heart: regular rate and rhythm   Rate: 62  Rhythm: normal sinus rhythm  Lab Results:  Basename 06/12/11 0530 06/11/11 1140  WBC 10.6* 10.9*  HGB 11.3* 11.7*  PLT 219 209    Basename 06/12/11 0530 06/11/11 0520  NA 137 135  K 4.3 4.4  CL 98 95*  CO2 27 29  GLUCOSE 208* 226*  BUN 39* 35*  CREATININE 2.13* 2.39*    Basename 06/09/11 2138 06/09/11 1459  TROPONINI <0.30 <0.30   Hepatic Function Panel No results found for this basename: PROT,ALBUMIN,AST,ALT,ALKPHOS,BILITOT,BILIDIR,IBILI in the last 72 hours No results found for this basename: CHOL in the last 72 hours No results found for this basename: INR in the last 72 hours  Imaging: Imaging results have been reviewed  Cardiac Studies:  Assessment/Plan:   Principal Problem:  *Dyspnea Active Problems:  Diastolic CHF, acute on chronic:  Grade one. EF 60-65%  COPD (chronic obstructive pulmonary disease)  CAD, CABG '98, cath '08, low risk Myoview Feb 2012  Healthcare-associated pneumonia, just discharged 4/7  Acute on chronic renal insufficiency  Back pain, chronic, prior back surg  DM (diabetes mellitus)  Hypothyroidism  HTN (hypertension)  Obesity  Sleep apnea, on C-pap  PVD, Lt Vertebral artery PTA in  '08, known moderate ICA disease   Plan- OK for discharge. We will see in follow up in one week. New baseline SCr 2.0-2.5. Discharge wgt down to 116.5kg from 121.8.   [employer] PA-C 06/12/2011, 9:30 AM

## 2011-06-12 NOTE — Progress Notes (Signed)
Cosign for Karen Wall RN assessment, med admin, and I/O 

## 2011-06-12 NOTE — Discharge Summary (Signed)
Physician Discharge Summary  Patient ID: SEVE MONETTE MRN: [id number] DOB/AGE: [birth year] 78 y.o.  Admit date: 06/09/2011 Discharge date: 06/12/2011  Primary Care Physician:  [name], MD, MD  Discharge Diagnoses:    .Dyspnea: Significantly improved  .Healthcare-associated pneumonia, just discharged 4/7 .Diastolic CHF, acute on chronic:  Grade one. EF 60-65%: Improved  .DM (diabetes mellitus) .Hypothyroidism .HTN (hypertension) .COPD (chronic obstructive pulmonary disease) .CAD, CABG '98, cath '08, low risk Myoview Feb 2012 .Obesity .Sleep apnea, on C-pap .Back pain, chronic, prior back surg .PVD, Lt Vertebral artery PTA in '08, known moderate ICA disease .Acute renal insufficiency, ? acute, last Cr 1.28 06/01/11 .Acute on chronic renal insufficiency  Consults:  Cardiology, [facility], Dr. Rennis Golden   Discharge Medications: Medication List  As of 06/12/2011 12:13 PM   TAKE these medications         albuterol (5 MG/ML) 0.5% nebulizer solution   Commonly known as: PROVENTIL   Take 0.5 mLs (2.5 mg total) by nebulization 2 (two) times daily.      albuterol 108 (90 BASE) MCG/ACT inhaler   Commonly known as: PROVENTIL HFA;VENTOLIN HFA   Inhale 2 puffs into the lungs every 6 (six) hours as needed for wheezing or shortness of breath.      allopurinol 300 MG tablet   Commonly known as: ZYLOPRIM   Take 150 mg by mouth at bedtime.      aspirin EC 81 MG tablet   Take 81 mg by mouth daily.      benzonatate 100 MG capsule   Commonly known as: TESSALON   Take 1 capsule (100 mg total) by mouth 3 (three) times daily.      BYETTA 10 MCG PEN [place]   Inject 10 Units into the skin 2 (two) times daily.      clopidogrel 75 MG tablet   Commonly known as: PLAVIX   Take 75 mg by mouth at bedtime.      colesevelam 625 MG tablet   Commonly known as: WELCHOL   Take 1,875 mg by mouth at bedtime.      furosemide 20 MG tablet   Commonly known as: LASIX   Take 2  tablets (40 mg total) by mouth 2 (two) times daily.      glipiZIDE 10 MG tablet   Commonly known as: GLUCOTROL   Take 20 mg by mouth daily.      guaiFENesin 100 MG/5ML Soln   Commonly known as: ROBITUSSIN   Take 10 mLs (200 mg total) by mouth every 4 (four) hours as needed (cough).      HYDROcodone-acetaminophen 10-500 MG per tablet   Commonly known as: LORTAB   Take 1 tablet by mouth every 4 (four) hours as needed. For pain      insulin glargine 100 UNIT/ML injection   Commonly known as: LANTUS   Inject 50 Units into the skin at bedtime.      levofloxacin 500 MG tablet   Commonly known as: LEVAQUIN   Take 1 tablet (500 mg total) by mouth every other day.      levothyroxine 175 MCG tablet   Commonly known as: SYNTHROID, LEVOTHROID   Take 1 tablet (175 mcg total) by mouth daily before breakfast.      metoprolol tartrate 25 MG tablet   Commonly known as: LOPRESSOR   Take 25 mg by mouth 2 (two) times daily.      NEURONTIN 300 MG capsule   Generic drug: gabapentin   TAKE 1  CAPSULE BY MOUTH THREE TIMES A DAY.      omeprazole 20 MG capsule   Commonly known as: PRILOSEC   Take 20 mg by mouth at bedtime.      simvastatin 40 MG tablet   Commonly known as: ZOCOR   Take 40 mg by mouth at bedtime.      [name] 100 MG tablet   Commonly known as: JANUVIA   Take 100 mg by mouth 2 (two) times daily.      SPIRIVA HANDIHALER IN   Inhale 1-2 capsules into the lungs daily. Twice a day if needed for breathing      Vitamin D3 1000 UNITS Caps   Take 1 capsule by mouth 2 (two) times daily.             Brief H and P: For complete details please refer to admission H and P, but in brief Patient is a 78 year old male with multiple medical problems including diabetes, OSA, chronic diastolic CHF, recent pneumonia, COPD, coronary artery disease, hypothyroidism who was recently admitted to [facility] this month and was discharged on 06/04/2011 after treated for CHF  exacerbation and pneumonia. Patient was released home and stated that he was closer to his baseline, however in the last 2 days prior to his admission, his dyspnea started worsening again. He stated that his cough had not improved and was having yellowish productive phlegm. He denied any fevers or chills however felt nauseous. In the last 2 days, his wife reported that he had not been able to lie flat and was sleeping on the recliner. He noticed slightly worse abdominal distention and puffy feet. The patient's wife also reported him having generalized weakness. During the previous admission, 2-D echo was done which showed EF of 60-65% with grade 1 diastolic dysfunction, patient was followed by [facility] cardiology in the office after the discharge.   Hospital Course:  Dyspnea: Multifactorial secondary to acute on chronic diastolic CHF, HCAP, obesity hypoventilation, OSA, generalized debility: Significantly improved by the time of discharge. Patient was admitted to the hospitalist service, placed on the telemetry monitored floor, he was ruled out for ACS, serial cardiac enzymes were negative. Chest x-ray also had revealed mild bibasilar infiltrates. Patient had recently been discharged from hospital on 06/04/2011  .Healthcare-associated pneumonia: Patient was placed on vancomycin, levofloxacin, cefepime per HCAP, blood cultures remained negative so far. He was placed on albuterol, Atrovent nebulizers and antitussives. He remained afebrile, antibiotics were narrowed down to levofloxacin to complete the course for 7 days.   .Diastolic CHF, acute on chronic: BNP 1059,  Cardiology consultation was obtained and patient was followed by [facility], Dr. Rennis Golden.  2-D echo was done during the previous admission, EF of 60-65% with grade 1 diastolic dysfunction, so it was not repeated this admission. Patient was placed on IV Lasix for diuresis. Patient did very well to the diuresis and discharge weight  went down to 116.5kg from 121.8. He was continued on aspirin, beta blocker, statin, Plavix. Home PT, RN for placed for CHF follow up/education and prevent readmission. Patient was ambulating with PT without any difficulty at the time of discharge.  .DM (diabetes mellitus) not well controlled, patient was maintained on insulin.     Marland KitchenHypothyroidism: TSH was checked and found to be10.34, hence increased Synthroid to 175 MCG daily   .HTN (hypertension): stable, continued on beta blocker and Lasix   .COPD (chronic obstructive pulmonary disease): per #2   .Obesity: Patient was counseled on weight  control and low salt diet     Day of Discharge BP 126/69  Pulse 77  Temp(Src) 97.4 F (36.3 C) (Oral)  Resp 18  Ht 5\' 11"  (1.803 m)  Wt 116.5 kg (256 lb 13.4 oz)  BMI 35.82 kg/m2  SpO2 94%  Physical Exam: General: Alert and awake oriented x3 not in any acute distress. HEENT: anicteric sclera, pupils reactive to light and accommodation CVS: S1-S2 clear no murmur rubs or gallops Chest: decreased breath sound at the bases  Abdomen: soft nontender, nondistended, normal bowel sounds, no organomegaly Extremities: no cyanosis, clubbing or edema noted bilaterally Neuro: Cranial nerves II-XII intact, no focal neurological deficits   The results of significant diagnostics from this hospitalization (including imaging, microbiology, ancillary and laboratory) are listed below for reference.    LAB RESULTS: Basic Metabolic Panel:  Lab 06/12/11 1610 06/11/11 0520  NA 137 135  K 4.3 4.4  CL 98 95*  CO2 27 29  GLUCOSE 208* 226*  BUN 39* 35*  CREATININE 2.13* 2.39*  CALCIUM 9.6 9.4  MG -- --  PHOS -- --   Liver Function Tests:  Lab 06/09/11 0543  AST 29  ALT 15  ALKPHOS 99  BILITOT 0.9  PROT 7.7  ALBUMIN 3.7    Lab 06/09/11 0543  LIPASE 30  AMYLASE --   CBC:  Lab 06/12/11 0530 06/11/11 1140  WBC 10.6* 10.9*  NEUTROABS -- --  HGB 11.3* 11.7*  HCT 34.4* 35.2*  MCV 95.8 --    PLT 219 209   Cardiac Enzymes:  Lab 06/09/11 2138 06/09/11 1459  CKTOTAL 39 43  CKMB 2.0 2.1  CKMBINDEX -- --  TROPONINI <0.30 <0.30   BNP: No components found with this basename: POCBNP:2 CBG:  Lab 06/12/11 1049 06/12/11 0614  GLUCAP 268* 202*    Significant Diagnostic Studies:  Dg Chest Port 1 View  06/09/2011  *RADIOLOGY REPORT*  Clinical Data: Shortness of breath.  PORTABLE CHEST - 1 VIEW  Comparison: Chest radiograph performed 05/31/2011  Findings: The lungs are well-aerated.  Mild bibasilar airspace opacities raise concern for pneumonia.  Alternatively, interstitial edema could have a similar appearance.  There is no evidence of pleural effusion or pneumothorax.  The cardiomediastinal silhouette is borderline enlarged; the patient is status post median sternotomy, with evidence of prior CABG.  A fractured superiormost sternal wire is again noted.  No acute osseous abnormalities are seen.  IMPRESSION:  1.  Mild bibasilar airspace opacities raise concern for pneumonia; alternatively, interstitial edema could have a similar appearance. 2.  Borderline cardiomegaly.  Original Report Authenticated By: Tonia Ghent, M.D.   Dg Abd Portable 1v  06/09/2011  *RADIOLOGY REPORT*  Clinical Data: Abdominal pain and abdominal distension; shortness of breath.  PORTABLE ABDOMEN - 1 VIEW  Comparison: Lumbar spine radiographs performed 02/13/2011, and CT of the abdomen and pelvis performed 01/22/2009  Findings: The visualized bowel gas pattern is nonspecific.  The colon is somewhat distended with air, though difficult to fully assess; scattered stool is seen within the colon.  No abnormal dilatation of small bowel loops is seen to suggest small bowel obstruction.  No free intra-abdominal air is identified, though evaluation for free air is limited on supine views.  Mild degenerative change is noted along the lumbar spine.  The patient is status post median sternotomy.  Clips are noted within the right  upper quadrant, reflecting prior cholecystectomy.  The visualized lung bases are essentially clear.  IMPRESSION: Nonspecific bowel gas pattern; colon distended with air,  though difficult to fully assess.  Moderate amount of stool noted in the colon.  No evidence for bowel obstruction; no free intra-abdominal air seen.  Original Report Authenticated By: Tonia Ghent, M.D.     Disposition and Follow-up: Discharge Orders    Future Orders Please Complete By Expires   Diet Carb Modified      Increase activity slowly      (HEART FAILURE PATIENTS) Call MD:  Anytime you have any of the following symptoms: 1) 3 pound weight gain in 24 hours or 5 pounds in 1 week 2) shortness of breath, with or without a dry hacking cough 3) swelling in the hands, feet or stomach 4) if you have to sleep on extra pillows at night in order to breathe.          DISPOSITION:  home with home PT, RN  DIET:  carb modified, low-salt diet  ACTIVITY:  as tolerated   DISCHARGE FOLLOW-UP Follow-up Information    Follow up with HAWKINS,EDWARD L, MD. Call in 2 weeks.      Follow up with Dwana Melena, PA. (office will call)    Contact information:   [address], [address] [place] [address] [phone]          Time spent on Discharge: 45 minutes  Signed:  Dorean Hiebert M.D. [facility] 06/12/2011, 12:13 PM

## 2011-06-12 NOTE — Progress Notes (Signed)
   CARE MANAGEMENT NOTE 06/12/2011  Patient:  [name], [name]   Account Number:  [id number]  Date Initiated:  06/12/2011  Documentation initiated by:  [facility]  Subjective/Objective Assessment:   Admitted with CHF, pneumonia. Lives with wife.     Action/Plan:   PT eval- recommended HHPT   Anticipated DC Date:  06/12/2011   Anticipated DC Plan:  HOME W HOME HEALTH SERVICES      DC Planning Services  CM consult      Choice offered to / List presented to:  C-1 Patient   DME arranged  NEBULIZER MACHINE  TUB BENCH      DME agency  [facility]     HH arranged  HH-1 RN  HH-2 PT      [facility] agency  [facility]   Status of service:  Completed, signed off Medicare Important Message given?   (If response is "NO", the following Medicare IM given date fields will be blank) Date Medicare IM given:   Date Additional Medicare IM given:    Discharge Disposition:  HOME W HOME HEALTH SERVICES  Per UR Regulation:    If discussed at Long Length of Stay Meetings, dates discussed:    Comments:  PCP Dr. Kari Baars  06/12/11 Spoke with patient and his wife about HHC for [facility] and HHPT. They chose [facility] from the [facility] list of [facility] agencies. They were agreeable to a home nebulizer and a tub bench. Contacted Darian at [facility] and requested nebulizer and tub bench be delivered to patient's room. Contacted Cheryl at [facility] and requested [facility] for CHF teaching and HHPT. Will fax face sheet, orders, face to face and d/c summary via TLC. They will go to see patient on 06/13/11 . Jacquelynn Cree RN, BSN, CCM

## 2011-06-12 NOTE — Progress Notes (Addendum)
ANTIBIOTIC CONSULT NOTE - INITIAL  Pharmacy Consult for vancomycin Indication: pneumonia  Allergies  Allergen Reactions  . Demerol Nausea And Vomiting    Patient Measurements: Height: 5\' 11"  (180.3 cm) Weight: 258 lb 3.2 oz (117.119 kg) (scale b) IBW/kg (Calculated) : 75.3   Vital Signs: Temp: 98.2 F (36.8 C) (04/14 2218) Temp src: Oral (04/14 2218) BP: 128/68 mmHg (04/14 2218) Pulse Rate: 79  (04/14 2218) Intake/Output from previous day: 04/14 0701 - 04/15 0700 In: 1083 [P.O.:1080; I.V.:3] Out: 2050 [Urine:2050] Intake/Output from this shift: Total I/O In: 3 [I.V.:3] Out: 450 [Urine:450]  Labs:  [facility] 06/11/11 1140 06/11/11 0520 06/10/11 0600 06/09/11 0900 06/09/11 0543  WBC 10.9* -- -- 18.3* 14.1*  HGB 11.7* -- -- 12.4* 12.8*  PLT 209 -- -- 196 175  LABCREA -- -- -- -- --  CREATININE -- 2.39* 2.35* 2.03* --   Estimated Creatinine Clearance: 34.8 ml/min (by C-G formula based on Cr of 2.39).  Microbiology: Recent Results (from the past 720 hour(s))  CULTURE, BLOOD (ROUTINE X 2)     Status: Normal   Collection Time   05/31/11  8:50 PM      Component Value Range Status Comment   Specimen Description BLOOD RIGHT HAND   Final    Special Requests BOTTLES DRAWN AEROBIC ONLY   Final    Culture  Setup Time [id number]   Final    Culture     Final    Value: STAPHYLOCOCCUS SPECIES (COAGULASE NEGATIVE)     Note: THE SIGNIFICANCE OF ISOLATING THIS ORGANISM FROM A SINGLE SET OF BLOOD CULTURES WHEN MULTIPLE SETS ARE DRAWN IS UNCERTAIN. PLEASE NOTIFY THE MICROBIOLOGY DEPARTMENT WITHIN ONE WEEK IF SPECIATION AND SENSITIVITIES ARE REQUIRED.     Note: Gram Stain Report Called to,Read Back By and Verified With: NEKA SHERARD @ 2308 ON 06/01/11 BY GOLLD   Report Status 06/03/2011 FINAL   Final   CULTURE, BLOOD (ROUTINE X 2)     Status: Normal   Collection Time   05/31/11  8:56 PM      Component Value Range Status Comment   Specimen Description BLOOD LEFT ANTECUBITAL   Final    Special Requests BOTTLES DRAWN AEROBIC AND ANAEROBIC [facility]   Final    Culture  Setup Time [id number]   Final    Culture NO GROWTH 5 DAYS   Final    Report Status 06/07/2011 FINAL   Final   URINE CULTURE     Status: Normal   Collection Time   05/31/11  9:04 PM      Component Value Range Status Comment   Specimen Description URINE, RANDOM   Final    Special Requests NONE   Final    Culture  Setup Time [id number]   Final    Colony Count NO GROWTH   Final    Culture NO GROWTH   Final    Report Status 06/02/2011 FINAL   Final   MRSA PCR SCREENING     Status: Abnormal   Collection Time   06/02/11  3:09 PM      Component Value Range Status Comment   MRSA by PCR POSITIVE (*) NEGATIVE  Final   MRSA PCR SCREENING     Status: Normal   Collection Time   06/09/11  9:44 AM      Component Value Range Status Comment   MRSA by PCR NEGATIVE  NEGATIVE  Final     Medical History: Past Medical History  Diagnosis Date  .  Diabetes mellitus type II   . Sleep apnea     sleep study done several years ago  . Hypothyroidism   . Hypertension   . Pneumonia   . GERD (gastroesophageal reflux disease)   . Arthritis   . Angina   . Shortness of breath     with exertion  . CHF (congestive heart failure)   . Heart attack   . COPD (chronic obstructive pulmonary disease)   . Coronary artery disease     Medications:  Prescriptions prior to admission  Medication Sig Dispense Refill  . allopurinol (ZYLOPRIM) 300 MG tablet Take 150 mg by mouth at bedtime.       Marland Kitchen aspirin EC 81 MG tablet Take 81 mg by mouth daily.       . Cholecalciferol (VITAMIN D3) 1000 UNITS CAPS Take 1 capsule by mouth 2 (two) times daily.       . clopidogrel (PLAVIX) 75 MG tablet Take 75 mg by mouth at bedtime.       . colesevelam (WELCHOL) 625 MG tablet Take 1,875 mg by mouth at bedtime.       . Exenatide (BYETTA 10 MCG PEN [place]) Inject 10 Units into the skin 2 (two) times daily.       . furosemide (LASIX) 20 MG tablet Take 1 tablet (20  mg total) by mouth 2 (two) times daily.  60 tablet  0  . glipiZIDE (GLUCOTROL) 10 MG tablet Take 20 mg by mouth daily.       Marland Kitchen HYDROcodone-acetaminophen (LORTAB) 10-500 MG per tablet Take 1 tablet by mouth every 4 (four) hours as needed. For pain      . insulin glargine (LANTUS) 100 UNIT/ML injection Inject 50 Units into the skin at bedtime.       Marland Kitchen levofloxacin (LEVAQUIN) 750 MG tablet Take 1 tablet (750 mg total) by mouth daily.  11 tablet  0  . levothyroxine (SYNTHROID, LEVOTHROID) 150 MCG tablet Take 150 mcg by mouth at bedtime.       . metoprolol tartrate (LOPRESSOR) 25 MG tablet Take 25 mg by mouth 2 (two) times daily.       Marland Kitchen NEURONTIN 300 MG capsule TAKE 1 CAPSULE BY MOUTH THREE TIMES A DAY.  90 each  2  . omeprazole (PRILOSEC) 20 MG capsule Take 20 mg by mouth at bedtime.       . simvastatin (ZOCOR) 40 MG tablet Take 40 mg by mouth at bedtime.      . [name] (JANUVIA) 100 MG tablet Take 100 mg by mouth 2 (two) times daily.       . Tiotropium Bromide Monohydrate (SPIRIVA HANDIHALER IN) Inhale 1-2 capsules into the lungs daily. Twice a day if needed for breathing       Scheduled:    . albuterol  2.5 mg Nebulization Q6H  . allopurinol  150 mg Oral QHS  . antiseptic oral rinse  15 mL Mouth Rinse BID  . aspirin EC  81 mg Oral Daily  . benzonatate  100 mg Oral TID  . ceFEPime (MAXIPIME) IV  2 g Intravenous Q24H  . cholecalciferol  1,000 Units Oral Daily  . clopidogrel  75 mg Oral QHS  . colesevelam  1,875 mg Oral QHS  . enoxaparin  40 mg Subcutaneous Q24H  . furosemide  40 mg Oral BID  . gabapentin  300 mg Oral TID  . insulin aspart  0-15 Units Subcutaneous TID WC  . insulin aspart  0-5 Units Subcutaneous QHS  .  insulin glargine  60 Units Subcutaneous QHS  . ipratropium  0.5 mg Nebulization Q6H  . levofloxacin (LEVAQUIN) IV  500 mg Intravenous Q48H  . levothyroxine  175 mcg Oral QHS  . linagliptin  5 mg Oral Daily  . metoprolol tartrate  25 mg Oral BID  . pantoprazole  40 mg  Oral Q1200  . simvastatin  40 mg Oral QHS  . sodium chloride  3 mL Intravenous Q12H  . DISCONTD: insulin glargine  50 Units Subcutaneous QHS   Assessment: 77yo male on IV ABX for PNA to add vancomycin to regimen.  Goal of Therapy:  Vancomycin trough level 15-20 mcg/ml  Plan:  Will give vancomycin 2500mg  IV x1 then 1500mg  IV Q24H and monitor CBC, Cx, levels prn.  Colleen Can PharmD BCPS 06/12/2011,12:41 AM  Addendum: Given discussion with RN and worsening renal function, will forego additional bolus though last dose was 4/13 and begin vancomycin 1500mg  IV Q24H.   VB    06/12/2011 2:33 AM

## 2012-04-16 ENCOUNTER — Other Ambulatory Visit (HOSPITAL_COMMUNITY): Payer: Self-pay | Admitting: Cardiology

## 2012-04-26 ENCOUNTER — Ambulatory Visit (HOSPITAL_COMMUNITY)
Admission: RE | Admit: 2012-04-26 | Discharge: 2012-04-26 | Disposition: A | Discharge status: Home / Self Care | Payer: Medicare Other | Source: Ambulatory Visit | Attending: Cardiology | Admitting: Cardiology

## 2012-04-26 DIAGNOSIS — I6529 Occlusion and stenosis of unspecified carotid artery: Secondary | ICD-10-CM | POA: Insufficient documentation

## 2012-04-26 DIAGNOSIS — E039 Hypothyroidism, unspecified: Secondary | ICD-10-CM | POA: Insufficient documentation

## 2012-04-26 DIAGNOSIS — I2510 Atherosclerotic heart disease of native coronary artery without angina pectoris: Secondary | ICD-10-CM | POA: Insufficient documentation

## 2012-04-26 DIAGNOSIS — I10 Essential (primary) hypertension: Secondary | ICD-10-CM | POA: Insufficient documentation

## 2012-04-26 DIAGNOSIS — I5032 Chronic diastolic (congestive) heart failure: Secondary | ICD-10-CM | POA: Insufficient documentation

## 2012-04-26 DIAGNOSIS — Z951 Presence of aortocoronary bypass graft: Secondary | ICD-10-CM | POA: Insufficient documentation

## 2012-04-26 NOTE — Progress Notes (Signed)
Carotid Duplex Completed. Zyron Deeley D  

## 2012-05-08 ENCOUNTER — Other Ambulatory Visit (HOSPITAL_COMMUNITY): Payer: Self-pay | Admitting: Cardiovascular Disease

## 2012-05-17 ENCOUNTER — Ambulatory Visit (HOSPITAL_COMMUNITY)
Admission: RE | Admit: 2012-05-17 | Discharge: 2012-05-17 | Disposition: A | Discharge status: Home / Self Care | Payer: Medicare Other | Source: Ambulatory Visit | Attending: Cardiovascular Disease | Admitting: Cardiovascular Disease

## 2012-05-17 DIAGNOSIS — I25810 Atherosclerosis of coronary artery bypass graft(s) without angina pectoris: Secondary | ICD-10-CM | POA: Insufficient documentation

## 2012-05-17 MED ORDER — TECHNETIUM TC 99M SESTAMIBI GENERIC - CARDIOLITE
30.8000 | Freq: Once | INTRAVENOUS | Status: AC | PRN
  Administered 2012-05-17: 30.8 via INTRAVENOUS

## 2012-05-17 MED ORDER — TECHNETIUM TC 99M SESTAMIBI GENERIC - CARDIOLITE
9.9000 | Freq: Once | INTRAVENOUS | Status: AC | PRN
  Administered 2012-05-17: 10 via INTRAVENOUS

## 2012-05-17 MED ORDER — REGADENOSON 0.4 MG/5ML IV SOLN
0.4000 mg | Freq: Once | INTRAVENOUS | Status: AC
  Administered 2012-05-17: 0.4 mg via INTRAVENOUS

## 2012-05-17 MED ORDER — AMINOPHYLLINE 25 MG/ML IV SOLN
75.0000 mg | Freq: Once | INTRAVENOUS | Status: AC
  Administered 2012-05-17: 75 mg via INTRAVENOUS

## 2012-05-17 NOTE — Procedures (Addendum)
[facility] [address] [place] [address] [phone]  Cardiology Nuclear Med Study  [name] is a 79 y.o. male     MRN : [id number]     DOB: [birth year]  Procedure Date: 05/17/2012  Nuclear Med Background Indication for Stress Test:  Graft Patency, Stent Patency and PTCA Patency History:  COPD and CHF;CAD;MI;CABG X5-03/98;STENT/PTCA-05/2006 Cardiac Risk Factors: History of Smoking, Hypertension, IDDM Type 2, Lipids, Obesity and PVD  Symptoms:  Chest Pain, Dizziness, DOE, Fatigue and SOB   Nuclear Pre-Procedure Caffeine/Decaff Intake:  10:00pm NPO After: 8:00am   IV Site: R Hand  IV 0.9% NS with Angio Cath:  22g  Chest Size (in):  48"  IV Started by: Emmit Pomfret, RN  Height: 5\' 11"  (1.803 m)  Cup Size: n/a  BMI:  Body mass index is 39.21 kg/(m^2). Weight:  281 lb (127.461 kg)   Tech Comments:  N/A    Nuclear Med Study 1 or 2 day study: 1 day  Stress Test Type:  Lexiscan  Order Authorizing Provider:  Nanetta Batty, MD   Resting Radionuclide: Technetium 35m Sestamibi  Resting Radionuclide Dose: 9.9 mCi   Stress Radionuclide:  Technetium 19m Sestamibi  Stress Radionuclide Dose: 30.8 mCi           Stress Protocol Rest HR: 56 Stress HR: 68  Rest BP: 131/70 Stress BP: 118/66  Exercise Time (min): n/a METS: n/a          Dose of Adenosine (mg):  n/a Dose of Lexiscan: 0.4 mg  Dose of Atropine (mg): n/a Dose of Dobutamine: n/a mcg/kg/min (at max HR)  Stress Test Technologist: Ernestene Mention, CCT Nuclear Technologist: Gonzella Lex, CNMT   Rest Procedure:  Myocardial perfusion imaging was performed at rest 45 minutes following the intravenous administration of Technetium 79m Sestamibi. Stress Procedure:  The patient received IV Lexiscan 0.4 mg over 15-seconds.  Technetium 110m Sestamibi injected at 30-seconds.  Due to patient's fatigue and shortness of breath, he was given IV Aminophylline 75 mg. Symptoms  were resolved during recovery.There were no significant changes with Lexiscan.  Quantitative spect images were obtained after a 45 minute delay.  Transient Ischemic Dilatation (Normal <1.22):  1.06 Lung/Heart Ratio (Normal <0.45):  0.33 QGS EDV:  99 ml QGS ESV:  41 ml LV Ejection Fraction: 58%  Rest ECG: Sinus bradycardia with 1st degree AVB  Stress ECG: No significant change from baseline ECG  QPS Raw Data Images:  Normal; no motion artifact; normal heart/lung ratio. Stress Images:  There is decreased uptake in the lateral wall. Rest Images:  Small apical anterior artifact. Mild lateral wall defect. Subtraction (SDS):  No evidence of ischemia.  Impression Exercise Capacity:  Lexiscan with no exercise. BP Response:  Normal blood pressure response. Clinical Symptoms:  No significant symptoms noted. ECG Impression:  No significant ECG changes with Lexiscan. Comparison with Prior Nuclear Study: No significant change from previous study in 2012. Mild, lateral reversibility exists.  Overall Impression:  Low risk stress nuclear study.  Small area of mild lateral reversibility, SDS <4.  LV Wall Motion:  NL LV Function; NL Wall Motion; 58%  Chrystie Nose, MD, [facility] Board Certified in Nuclear Cardiology Attending Cardiologist [facility]  Chrystie Nose, MD  05/17/2012 3:20 PM

## 2012-08-05 ENCOUNTER — Emergency Department (HOSPITAL_COMMUNITY): Payer: Medicare Other

## 2012-08-05 ENCOUNTER — Emergency Department (HOSPITAL_COMMUNITY)
Admission: EM | Admit: 2012-08-05 | Discharge: 2012-08-05 | Disposition: A | Discharge status: Home / Self Care | Payer: Medicare Other | Attending: Emergency Medicine | Admitting: Emergency Medicine

## 2012-08-05 DIAGNOSIS — W010XXA Fall on same level from slipping, tripping and stumbling without subsequent striking against object, initial encounter: Secondary | ICD-10-CM | POA: Insufficient documentation

## 2012-08-05 DIAGNOSIS — Z8669 Personal history of other diseases of the nervous system and sense organs: Secondary | ICD-10-CM | POA: Insufficient documentation

## 2012-08-05 DIAGNOSIS — Y9389 Activity, other specified: Secondary | ICD-10-CM | POA: Insufficient documentation

## 2012-08-05 DIAGNOSIS — Z951 Presence of aortocoronary bypass graft: Secondary | ICD-10-CM | POA: Insufficient documentation

## 2012-08-05 DIAGNOSIS — Z794 Long term (current) use of insulin: Secondary | ICD-10-CM | POA: Insufficient documentation

## 2012-08-05 DIAGNOSIS — I509 Heart failure, unspecified: Secondary | ICD-10-CM | POA: Insufficient documentation

## 2012-08-05 DIAGNOSIS — K219 Gastro-esophageal reflux disease without esophagitis: Secondary | ICD-10-CM | POA: Insufficient documentation

## 2012-08-05 DIAGNOSIS — S20219A Contusion of unspecified front wall of thorax, initial encounter: Secondary | ICD-10-CM | POA: Insufficient documentation

## 2012-08-05 DIAGNOSIS — J449 Chronic obstructive pulmonary disease, unspecified: Secondary | ICD-10-CM | POA: Insufficient documentation

## 2012-08-05 DIAGNOSIS — S20211A Contusion of right front wall of thorax, initial encounter: Secondary | ICD-10-CM

## 2012-08-05 DIAGNOSIS — E039 Hypothyroidism, unspecified: Secondary | ICD-10-CM | POA: Insufficient documentation

## 2012-08-05 DIAGNOSIS — I252 Old myocardial infarction: Secondary | ICD-10-CM | POA: Insufficient documentation

## 2012-08-05 DIAGNOSIS — Z8701 Personal history of pneumonia (recurrent): Secondary | ICD-10-CM | POA: Insufficient documentation

## 2012-08-05 DIAGNOSIS — I2510 Atherosclerotic heart disease of native coronary artery without angina pectoris: Secondary | ICD-10-CM | POA: Insufficient documentation

## 2012-08-05 DIAGNOSIS — Z87891 Personal history of nicotine dependence: Secondary | ICD-10-CM | POA: Insufficient documentation

## 2012-08-05 DIAGNOSIS — E119 Type 2 diabetes mellitus without complications: Secondary | ICD-10-CM | POA: Insufficient documentation

## 2012-08-05 DIAGNOSIS — Z7902 Long term (current) use of antithrombotics/antiplatelets: Secondary | ICD-10-CM | POA: Insufficient documentation

## 2012-08-05 DIAGNOSIS — Z7982 Long term (current) use of aspirin: Secondary | ICD-10-CM | POA: Insufficient documentation

## 2012-08-05 DIAGNOSIS — Z79899 Other long term (current) drug therapy: Secondary | ICD-10-CM | POA: Insufficient documentation

## 2012-08-05 DIAGNOSIS — S59909A Unspecified injury of unspecified elbow, initial encounter: Secondary | ICD-10-CM | POA: Insufficient documentation

## 2012-08-05 DIAGNOSIS — Z8739 Personal history of other diseases of the musculoskeletal system and connective tissue: Secondary | ICD-10-CM | POA: Insufficient documentation

## 2012-08-05 DIAGNOSIS — Y9289 Other specified places as the place of occurrence of the external cause: Secondary | ICD-10-CM | POA: Insufficient documentation

## 2012-08-05 DIAGNOSIS — S6990XA Unspecified injury of unspecified wrist, hand and finger(s), initial encounter: Secondary | ICD-10-CM | POA: Insufficient documentation

## 2012-08-05 DIAGNOSIS — Z8679 Personal history of other diseases of the circulatory system: Secondary | ICD-10-CM | POA: Insufficient documentation

## 2012-08-05 DIAGNOSIS — J4489 Other specified chronic obstructive pulmonary disease: Secondary | ICD-10-CM | POA: Insufficient documentation

## 2012-08-05 DIAGNOSIS — Z9889 Other specified postprocedural states: Secondary | ICD-10-CM | POA: Insufficient documentation

## 2012-08-05 DIAGNOSIS — I10 Essential (primary) hypertension: Secondary | ICD-10-CM | POA: Insufficient documentation

## 2012-08-05 NOTE — ED Notes (Signed)
Pt alert & oriented x4. Patient given discharge instructions, paperwork & prescription(s). Patient verbalized understanding. Pt left department w/ no further questions.  

## 2012-08-05 NOTE — ED Notes (Signed)
C-collar placed and pt log rolled onto long spine board for precaution

## 2012-08-05 NOTE — ED Notes (Signed)
Patient states that he stumbled and fell into the floor, states that he is having right rib pain

## 2012-08-05 NOTE — ED Provider Notes (Signed)
History     CSN: [id number]  Arrival date & time 08/05/12  1806   First MD Initiated Contact with Patient 08/05/12 1805      Chief Complaint  Patient presents with  . Fall  . Rib Injury    (Consider location/radiation/quality/duration/timing/severity/associated sxs/prior treatment) Patient is a 79 y.o. male presenting with fall.  Fall   Pt was here with his wife who was a patient. As she was being discharged he stumbled and fell in the hallway, landing on his R side. Fall was unwitnessed. He states he lost his balance and tripped. Denies any head injury or LOC. Reports moderate aching pain in R ribs and mild pain in R elbow. He was immobilized in C-collar and moved to a stretcher using spine board while in the hall and moved to Room 6 for evaluation.   Past Medical History  Diagnosis Date  . Diabetes mellitus type II   . Sleep apnea     sleep study done several years ago  . Hypothyroidism   . Hypertension   . Pneumonia   . GERD (gastroesophageal reflux disease)   . Arthritis   . Angina   . Shortness of breath     with exertion  . CHF (congestive heart failure)   . Heart attack   . COPD (chronic obstructive pulmonary disease)   . Coronary artery disease     Past Surgical History  Procedure Laterality Date  . Gallbladder surgery    . Back surgery    . Heart stents    . Heart bypass    . Finger surgery  right little  . Cholecystectomy    . Coronary artery bypass graft    . Tonsillectomy    . Cardiac catheterization      does not remember last time  . Knee arthroscopy      left  . Lumbar laminectomy/decompression microdiscectomy  04/11/2011    Procedure: LUMBAR LAMINECTOMY/DECOMPRESSION MICRODISCECTOMY 1 LEVEL;  Surgeon: [name], MD;  Location: [facility];  Service: Neurosurgery;  Laterality: Right;  RIGHT Lumbar Three-Four laminectomy with resection of synovial cyst    Family History  Problem Relation Age of Onset  . Arthritis    . Cancer    .  Diabetes      History  Substance Use Topics  . Smoking status: Former Games developer  . Smokeless tobacco: Not on file  . Alcohol Use: No      Review of Systems All other systems reviewed and are negative except as noted in HPI.   Allergies  Demerol  Home Medications   Current Outpatient Rx  Name  Route  Sig  Dispense  Refill  . EXPIRED: albuterol (PROVENTIL HFA;VENTOLIN HFA) 108 (90 BASE) MCG/ACT inhaler   Inhalation   Inhale 2 puffs into the lungs every 6 (six) hours as needed for wheezing or shortness of breath.   1 Inhaler   2   . EXPIRED: albuterol (PROVENTIL) (5 MG/ML) 0.5% nebulizer solution   Nebulization   Take 0.5 mLs (2.5 mg total) by nebulization 2 (two) times daily.   20 mL   3   . allopurinol (ZYLOPRIM) 300 MG tablet   Oral   Take 150 mg by mouth at bedtime.          Marland Kitchen aspirin EC 81 MG tablet   Oral   Take 81 mg by mouth daily.          . Cholecalciferol (VITAMIN D3) 1000 UNITS CAPS  Oral   Take 1 capsule by mouth 2 (two) times daily.          . clopidogrel (PLAVIX) 75 MG tablet   Oral   Take 75 mg by mouth at bedtime.          . colesevelam (WELCHOL) 625 MG tablet   Oral   Take 1,875 mg by mouth at bedtime.          . Exenatide (BYETTA 10 MCG PEN [place])   Subcutaneous   Inject 10 Units into the skin 2 (two) times daily.          Marland Kitchen EXPIRED: furosemide (LASIX) 20 MG tablet   Oral   Take 2 tablets (40 mg total) by mouth 2 (two) times daily.   120 tablet   2   . glipiZIDE (GLUCOTROL) 10 MG tablet   Oral   Take 20 mg by mouth daily.          Marland Kitchen guaiFENesin (ROBITUSSIN) 100 MG/5ML SOLN   Oral   Take 10 mLs (200 mg total) by mouth every 4 (four) hours as needed (cough).   120 mL   0   . HYDROcodone-acetaminophen (LORTAB) 10-500 MG per tablet   Oral   Take 1 tablet by mouth every 4 (four) hours as needed. For pain         . insulin glargine (LANTUS) 100 UNIT/ML injection   Subcutaneous   Inject 50 Units into the skin at  bedtime.          Marland Kitchen EXPIRED: levothyroxine (SYNTHROID, LEVOTHROID) 175 MCG tablet   Oral   Take 1 tablet (175 mcg total) by mouth daily before breakfast.   30 tablet   3   . metoprolol tartrate (LOPRESSOR) 25 MG tablet   Oral   Take 25 mg by mouth 2 (two) times daily.          Marland Kitchen NEURONTIN 300 MG capsule      TAKE 1 CAPSULE BY MOUTH THREE TIMES A DAY.   90 each   2   . omeprazole (PRILOSEC) 20 MG capsule   Oral   Take 20 mg by mouth at bedtime.          . simvastatin (ZOCOR) 40 MG tablet   Oral   Take 40 mg by mouth at bedtime.         . [name] (JANUVIA) 100 MG tablet   Oral   Take 100 mg by mouth 2 (two) times daily.          . Tiotropium Bromide Monohydrate (SPIRIVA HANDIHALER IN)   Inhalation   Inhale 1-2 capsules into the lungs daily. Twice a day if needed for breathing           BP 150/78  Pulse 69  Temp(Src) 97.9 F (36.6 C) (Oral)  Ht 6' (1.829 m)  Wt 267 lb (121.11 kg)  BMI 36.2 kg/m2  SpO2 100%  Physical Exam  Nursing note and vitals reviewed. Constitutional: He is oriented to person, place, and time. He appears well-developed and well-nourished.  HENT:  Head: Normocephalic and atraumatic.  Eyes: EOM are normal. Pupils are equal, round, and reactive to light.  Neck:  C-collar cleared by NEXUS  Cardiovascular: Normal rate, normal heart sounds and intact distal pulses.   Pulmonary/Chest: Effort normal and breath sounds normal. He exhibits tenderness (R lateral ribs).  Abdominal: Bowel sounds are normal. He exhibits no distension. There is no tenderness.  Musculoskeletal: Normal range of motion. He exhibits  tenderness (tender to R elbow with palpation, FROM, no deformity). He exhibits no edema.  Neurological: He is alert and oriented to person, place, and time. He has normal strength. No cranial nerve deficit or sensory deficit.  Skin: Skin is warm and dry. No rash noted.  Psychiatric: He has a normal mood and affect.    ED Course   Procedures (including critical care time)  Labs Reviewed - No data to display Dg Ribs Unilateral W/chest Right  08/05/2012   *RADIOLOGY REPORT*  Clinical Data: Pain post fall.  RIGHT RIBS AND CHEST - 3+ VIEW  Comparison:  06/09/2011 and earlier studies  Findings: Previous CABG.  Stable cardiomegaly.  Peripheral airspace opacities at the right lung base as before.  No pneumothorax or pleural effusion.  Detailed views show no displaced fracture or other focal lesion.  Vascular clips in the right upper abdomen.  IMPRESSION: 1.  No pneumothorax, displaced fracture, or other acute abnormality 2.  Persistent peripheral airspace opacities in the right lower lung since 2013.  Consider CT for further characterization to exclude neoplasm.   Original Report Authenticated By: D. Andria Rhein, MD   Dg Elbow Complete Right  08/05/2012   *RADIOLOGY REPORT*  Clinical Data: Pain post fall  RIGHT ELBOW - COMPLETE 3+ VIEW  Comparison: None.  Findings: Small corticated ossicle lateral to the lateral humeral epicondyle.  No effusion. Negative for fracture, dislocation, or other acute abnormality.  Normal alignment and mineralization. No other significant degenerative change.  Regional soft tissues unremarkable.  IMPRESSION:  Negative   Original Report Authenticated By: D. Andria Rhein, MD     1. Rib contusion, right, initial encounter       MDM  Xrays ordered, pt declines pain medication for now.   7:40 PM Xrays reviewed. Pt aware of peripheral opacities, states they are from remote surgery to remove 'mass' which was a foreign body. Advised PCP follow up for recheck. Declines pain meds.       Ayza Ripoll B. Bernette Mayers, MD 08/05/12 1941

## 2012-09-23 ENCOUNTER — Other Ambulatory Visit: Payer: Self-pay | Admitting: *Deleted

## 2012-09-23 MED ORDER — ISOSORBIDE DINITRATE 20 MG PO TABS: 20.0000 mg | ORAL_TABLET | Freq: Two times a day (BID) | ORAL | Status: DC

## 2012-10-14 ENCOUNTER — Ambulatory Visit (HOSPITAL_COMMUNITY)
Admission: RE | Admit: 2012-10-14 | Discharge: 2012-10-14 | Disposition: A | Discharge status: Home / Self Care | Payer: Medicare Other | Source: Ambulatory Visit | Attending: Pulmonary Disease | Admitting: Pulmonary Disease

## 2012-10-14 ENCOUNTER — Other Ambulatory Visit (HOSPITAL_COMMUNITY): Payer: Self-pay | Admitting: Pulmonary Disease

## 2012-10-14 DIAGNOSIS — M1120 Other chondrocalcinosis, unspecified site: Secondary | ICD-10-CM | POA: Insufficient documentation

## 2012-10-14 DIAGNOSIS — R52 Pain, unspecified: Secondary | ICD-10-CM

## 2012-10-14 DIAGNOSIS — M25569 Pain in unspecified knee: Secondary | ICD-10-CM | POA: Insufficient documentation

## 2012-10-14 DIAGNOSIS — M25559 Pain in unspecified hip: Secondary | ICD-10-CM | POA: Insufficient documentation

## 2012-10-22 ENCOUNTER — Other Ambulatory Visit (HOSPITAL_COMMUNITY): Payer: Self-pay | Admitting: Cardiovascular Disease

## 2012-11-27 ENCOUNTER — Other Ambulatory Visit (HOSPITAL_COMMUNITY): Payer: Self-pay | Admitting: Cardiovascular Disease

## 2012-11-27 ENCOUNTER — Other Ambulatory Visit: Payer: Self-pay | Admitting: *Deleted

## 2012-11-27 MED ORDER — METOPROLOL TARTRATE 25 MG PO TABS: 12.5000 mg | ORAL_TABLET | Freq: Two times a day (BID) | ORAL | Status: DC

## 2012-11-27 NOTE — Telephone Encounter (Signed)
Rx refused. Already responded to and sent in to the pharmacy.

## 2012-12-09 ENCOUNTER — Ambulatory Visit (INDEPENDENT_AMBULATORY_CARE_PROVIDER_SITE_OTHER): Payer: Medicare Other | Admitting: Cardiology

## 2012-12-09 ENCOUNTER — Telehealth: Payer: Self-pay | Admitting: Cardiovascular Disease

## 2012-12-09 ENCOUNTER — Encounter: Payer: Self-pay | Admitting: Cardiology

## 2012-12-09 VITALS — BP 140/70 | HR 57 | Ht 71.0 in | Wt 277.0 lb

## 2012-12-09 DIAGNOSIS — I2089 Other forms of angina pectoris: Secondary | ICD-10-CM | POA: Insufficient documentation

## 2012-12-09 DIAGNOSIS — I208 Other forms of angina pectoris: Secondary | ICD-10-CM

## 2012-12-09 DIAGNOSIS — I5033 Acute on chronic diastolic (congestive) heart failure: Secondary | ICD-10-CM

## 2012-12-09 DIAGNOSIS — I519 Heart disease, unspecified: Secondary | ICD-10-CM

## 2012-12-09 DIAGNOSIS — N183 Chronic kidney disease, stage 3 unspecified: Secondary | ICD-10-CM

## 2012-12-09 DIAGNOSIS — J449 Chronic obstructive pulmonary disease, unspecified: Secondary | ICD-10-CM

## 2012-12-09 DIAGNOSIS — E669 Obesity, unspecified: Secondary | ICD-10-CM

## 2012-12-09 DIAGNOSIS — I209 Angina pectoris, unspecified: Secondary | ICD-10-CM

## 2012-12-09 DIAGNOSIS — I739 Peripheral vascular disease, unspecified: Secondary | ICD-10-CM

## 2012-12-09 DIAGNOSIS — G4730 Sleep apnea, unspecified: Secondary | ICD-10-CM

## 2012-12-09 DIAGNOSIS — E119 Type 2 diabetes mellitus without complications: Secondary | ICD-10-CM

## 2012-12-09 DIAGNOSIS — I10 Essential (primary) hypertension: Secondary | ICD-10-CM

## 2012-12-09 DIAGNOSIS — I5189 Other ill-defined heart diseases: Secondary | ICD-10-CM

## 2012-12-09 DIAGNOSIS — I2510 Atherosclerotic heart disease of native coronary artery without angina pectoris: Secondary | ICD-10-CM

## 2012-12-09 DIAGNOSIS — I509 Heart failure, unspecified: Secondary | ICD-10-CM

## 2012-12-09 LAB — CBC
HCT: 39.3 % (ref 39.0–52.0)
Hemoglobin: 13 g/dL (ref 13.0–17.0)
MCH: 31.6 pg (ref 26.0–34.0)
MCHC: 33.1 g/dL (ref 30.0–36.0)
MCV: 95.6 fL (ref 78.0–100.0)
Platelets: 248 10*3/uL (ref 150–400)
RBC: 4.11 MIL/uL — ABNORMAL LOW (ref 4.22–5.81)
RDW: 15.1 % (ref 11.5–15.5)
WBC: 9.1 10*3/uL (ref 4.0–10.5)

## 2012-12-09 MED ORDER — ISOSORBIDE DINITRATE 20 MG PO TABS: 20.0000 mg | ORAL_TABLET | Freq: Three times a day (TID) | ORAL | Status: DC

## 2012-12-09 MED ORDER — NITROGLYCERIN 0.4 MG SL SUBL: 0.4000 mg | SUBLINGUAL_TABLET | SUBLINGUAL | Status: AC | PRN

## 2012-12-09 MED ORDER — AMLODIPINE BESYLATE 5 MG PO TABS: 5.0000 mg | ORAL_TABLET | Freq: Every day | ORAL | Status: DC

## 2012-12-09 NOTE — Assessment & Plan Note (Signed)
He has been having increasing shoulder pain and SOB with exertion for two months

## 2012-12-09 NOTE — Telephone Encounter (Signed)
Got up yesterday morning,felt real dizzy,this morning feeilng the same way,laid around all day yesterday. Every afternoon he start hurting in  His shoulders and arms,they make him feel completely exhausted,this have been going on for a while.Thinks he need to be seen today.

## 2012-12-09 NOTE — Progress Notes (Signed)
12/09/2012 [name]   03-Feb-1934  [id number]  Primary Physicia HAWKINS,EDWARD L, MD Primary Cardiologist: Dr Allyson Sabal  HPI:  The patient is 79 year old moderately overweight married Caucasian male father of 1 child (1 deceased), grandfather to 1 stepchild who is accompanied by his wife and daughter today. 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 He had coronary artery bypass grafting in March 1998 with a LIMA to his LAD, a vein to the circumflex obtuse marginal branch, a vein to the right coronary artery. He also had left vertebral artery stenting by Dr. Antony Contras in [place] after his surgery in May 1998. His other problems include hypertension, hyperlipidemia, non-insulin-dependent diabetes and COPD. He has moderate renal insufficiency and was taken off ACE. He has obstructive sleep apnea on CPAP and is compliant. Cath performed May 29, 2006 revealed a patent LIMA to the LAD, 90% apical LAD lesion, total native circumflex and RCA with occluded vein to the OM branch, and a patent vein to the distal right. His vertebral stent was widely patent at that time. His EF was 55% with mild inferobasal hypokinesia April 2013. His last Myoview performed March 2014 revealed mild inferolateral ischemia explainable by his anatomy. It was a low risk study. Dr. Juanetta Gosling follows his lipid profile. He is in the office today complaining of exertional bilateral shoulder pain and SOB. He says when ever he walks to get the mail or takes the trash to the street he get SOB and has pain across his shoulders. He has to stop and rest. His symptoms seems to be increasing in severity and take longer to relieve with rest.     Current Outpatient Prescriptions  Medication Sig Dispense Refill  . allopurinol (ZYLOPRIM) 300 MG tablet Take 150 mg by mouth at bedtime.       Marland Kitchen aspirin EC 81 MG tablet Take 81 mg by mouth every morning.       . Cholecalciferol (VITAMIN D3) 1000 UNITS CAPS Take 1 capsule by mouth 2 (two) times daily.        . clopidogrel (PLAVIX) 75 MG tablet TAKE ONE TABLET DAILY.  90 tablet  1  . gabapentin (NEURONTIN) 300 MG capsule Take 300 mg by mouth 4 (four) times daily as needed (takes three times daily but may take one in between if needed).      Marland Kitchen glipiZIDE (GLUCOTROL) 10 MG tablet Take 10 mg by mouth 2 (two) times daily.       Marland Kitchen HYDROcodone-acetaminophen (NORCO) 10-325 MG per tablet Take 1 tablet by mouth 4 (four) times daily as needed for pain.      Marland Kitchen insulin glargine (LANTUS) 100 UNIT/ML injection Inject 44 Units into the skin 2 (two) times daily.       . isosorbide dinitrate (ISORDIL) 20 MG tablet Take 1 tablet (20 mg total) by mouth 3 (three) times daily.  60 tablet  6  . metoprolol tartrate (LOPRESSOR) 25 MG tablet Take 0.5 tablets (12.5 mg total) by mouth 2 (two) times daily.  90 tablet  1  . omeprazole (PRILOSEC) 20 MG capsule Take 20 mg by mouth 2 (two) times daily.       . simvastatin (ZOCOR) 40 MG tablet Take 40 mg by mouth at bedtime.      . [name] (JANUVIA) 100 MG tablet Take 50 mg by mouth 2 (two) times daily.       . Tiotropium Bromide Monohydrate (SPIRIVA HANDIHALER IN) Inhale 1 capsule into the lungs every morning.       Marland Kitchen  albuterol (PROVENTIL HFA;VENTOLIN HFA) 108 (90 BASE) MCG/ACT inhaler Inhale 2 puffs into the lungs every 6 (six) hours as needed for wheezing or shortness of breath.  1 Inhaler  2  . albuterol (PROVENTIL) (5 MG/ML) 0.5% nebulizer solution Take 0.5 mLs (2.5 mg total) by nebulization 2 (two) times daily.  20 mL  3  . amLODipine (NORVASC) 5 MG tablet Take 1 tablet (5 mg total) by mouth daily.  180 tablet  3  . furosemide (LASIX) 20 MG tablet Take 20 mg by mouth 2 (two) times daily.      Marland Kitchen levothyroxine (SYNTHROID, LEVOTHROID) 175 MCG tablet Take 1 tablet (175 mcg total) by mouth daily before breakfast.  30 tablet  3  . nitroGLYCERIN (NITROSTAT) 0.4 MG SL tablet Place 1 tablet (0.4 mg total) under the tongue every 5 (five) minutes as needed for chest pain.  25 tablet  2    No current facility-administered medications for this visit.    Allergies  Allergen Reactions  . Demerol Nausea And Vomiting    History   Social History  . Marital Status: Married    Spouse Name: N/A    Number of Children: N/A  . Years of Education: N/A   Occupational History  . retired    Social History Main Topics  . Smoking status: Former Smoker    Quit date: 12/09/1985  . Smokeless tobacco: Not on file  . Alcohol Use: No  . Drug Use: No  . Sexual Activity: Not on file   Other Topics Concern  . Not on file   Social History Narrative  . No narrative on file     Review of Systems: General: negative for chills, fever, night sweats or weight changes.  Cardiovascular: negative for chest pain, dyspnea on exertion, edema, orthopnea, palpitations, paroxysmal nocturnal dyspnea or shortness of breath Dermatological: negative for rash Respiratory: negative for cough or wheezing Urologic: negative for hematuria Abdominal: negative for nausea, vomiting, diarrhea, bright red blood per rectum, melena, or hematemesis Neurologic: negative for visual changes, syncope, or dizziness All other systems reviewed and are otherwise negative except as noted above.    Blood pressure 140/70, pulse 57, height 5\' 11"  (1.803 m), weight 277 lb (125.646 kg).  General appearance: alert, cooperative, no distress and morbidly obese Neck: no carotid bruit and no JVD Lungs: clear to auscultation bilaterally Heart: regular rate and rhythm and 1/6 systolic murmur Abdomen: obese Extremities: trace edema Skin: cool and dry Neurologic: Grossly normal  EKG NSR 1st AVB  ASSESSMENT AND PLAN:   Exertional angina-  He has been having increasing shoulder pain and SOB with exertion for two months  CAD, CABG '98, cath '08, low risk Myoview Feb 2012 Low risk Myoview March 2014  Sleep apnea, on C-pap . PVD, Lt Vertebral artery PTA in '08, known moderate ICA disease Dopplers showed moderate  carotid disease Feb 2014  DM (diabetes mellitus) . Obesity . HTN (hypertension) . Diastolic CHF, acute on chronic:  Grade one. EF 60-65% . COPD (chronic obstructive pulmonary disease) .    PLAN  I am hesitant to recommend cath with his history of renal insufficiency. The last SCr we have is from 2013 and it was 2.16.  I increased his Isordil to TID and added Norvasc.I will set him up for a Myoview and a follow up with Dr Allyson Sabal. If his scan shows no new changes and his symptoms are relieved with medical Rx we may be able to avoid a cath.  Tymara Saur KPA-C  12/09/2012 2:58 PM

## 2012-12-09 NOTE — Telephone Encounter (Signed)
Returned call to patient's emergency contact (wife - Britta Mccreedy). She stated that patient has been waking up very dizzy and it appears as if he is fatigued all day ("laying around all day" per wife). This has been going on for some while. Wife also states that patient has not been doing any activity out of the ordinary but that when he takes the trash cans to the stress he gets exhausted easily, with bilateral arm and shoulder pain and also c/o SOB. Informed patient that Corine Shelter, PA was available for office visits today. Patient and wife agreed to come in at 1:40pm for OV.

## 2012-12-09 NOTE — Patient Instructions (Signed)
Your physician has requested that you have a lexiscan myoview. For further information please visit [email or web address] Please follow instruction sheet, as given. Your physician recommends that you return for lab today Your physician recommends that you schedule a follow-up appointment with Dr Allyson Sabal, we will arrange. Increase Isordil to 20 mg 3 times daily- morning, after lunch, before bed Start Amlodipine 5 mg daily. Nitroglycerin tablets if needed

## 2012-12-09 NOTE — Assessment & Plan Note (Signed)
Low risk Myoview March 2014

## 2012-12-09 NOTE — Assessment & Plan Note (Signed)
Dopplers showed moderate carotid disease Feb 2014

## 2012-12-10 LAB — BASIC METABOLIC PANEL
BUN: 13 mg/dL (ref 6–23)
CO2: 32 mEq/L (ref 19–32)
Calcium: 9 mg/dL (ref 8.4–10.5)
Chloride: 100 mEq/L (ref 96–112)
Creat: 1.6 mg/dL — ABNORMAL HIGH (ref 0.50–1.35)
Glucose, Bld: 176 mg/dL — ABNORMAL HIGH (ref 70–99)
Potassium: 4.5 mEq/L (ref 3.5–5.3)
Sodium: 140 mEq/L (ref 135–145)

## 2012-12-11 ENCOUNTER — Ambulatory Visit (HOSPITAL_COMMUNITY)
Admission: RE | Admit: 2012-12-11 | Discharge: 2012-12-11 | Disposition: A | Discharge status: Home / Self Care | Payer: Medicare Other | Source: Ambulatory Visit | Attending: Cardiology | Admitting: Cardiology

## 2012-12-11 ENCOUNTER — Ambulatory Visit (INDEPENDENT_AMBULATORY_CARE_PROVIDER_SITE_OTHER): Payer: Medicare Other

## 2012-12-11 VITALS — BP 152/69 | HR 68 | Resp 16

## 2012-12-11 DIAGNOSIS — Q828 Other specified congenital malformations of skin: Secondary | ICD-10-CM

## 2012-12-11 DIAGNOSIS — I2089 Other forms of angina pectoris: Secondary | ICD-10-CM

## 2012-12-11 DIAGNOSIS — I209 Angina pectoris, unspecified: Secondary | ICD-10-CM

## 2012-12-11 DIAGNOSIS — M2010 Hallux valgus (acquired), unspecified foot: Secondary | ICD-10-CM

## 2012-12-11 DIAGNOSIS — E1142 Type 2 diabetes mellitus with diabetic polyneuropathy: Secondary | ICD-10-CM

## 2012-12-11 DIAGNOSIS — E1140 Type 2 diabetes mellitus with diabetic neuropathy, unspecified: Secondary | ICD-10-CM

## 2012-12-11 DIAGNOSIS — I208 Other forms of angina pectoris: Secondary | ICD-10-CM

## 2012-12-11 DIAGNOSIS — M2040 Other hammer toe(s) (acquired), unspecified foot: Secondary | ICD-10-CM

## 2012-12-11 DIAGNOSIS — E1149 Type 2 diabetes mellitus with other diabetic neurological complication: Secondary | ICD-10-CM

## 2012-12-11 MED ORDER — REGADENOSON 0.4 MG/5ML IV SOLN
0.4000 mg | Freq: Once | INTRAVENOUS | Status: AC
  Administered 2012-12-11: 0.4 mg via INTRAVENOUS

## 2012-12-11 MED ORDER — TECHNETIUM TC 99M SESTAMIBI GENERIC - CARDIOLITE
10.0000 | Freq: Once | INTRAVENOUS | Status: AC | PRN
  Administered 2012-12-11: 10 via INTRAVENOUS

## 2012-12-11 MED ORDER — AMINOPHYLLINE 25 MG/ML IV SOLN
75.0000 mg | Freq: Once | INTRAVENOUS | Status: AC
  Administered 2012-12-11: 75 mg via INTRAVENOUS

## 2012-12-11 MED ORDER — TECHNETIUM TC 99M SESTAMIBI GENERIC - CARDIOLITE
30.0000 | Freq: Once | INTRAVENOUS | Status: AC | PRN
  Administered 2012-12-11: 30 via INTRAVENOUS

## 2012-12-11 NOTE — Patient Instructions (Signed)

## 2012-12-11 NOTE — Progress Notes (Signed)
  Subjective:    Patient ID: [name], male    DOB: August 19, 1933, 79 y.o.   MRN: [id number]  HPI Comments: DISPENSED DIABETIC  SHOES AND GIVEN INSTRUCTION.  patient presents at this time for followup in particular diabetic accident shoes. Patient has long-standing history of diabetes with peripheral neuropathy. Patient is HAV deformity or bunion and hammertoes with digital contractures associated keratoses. Patient wearing diabetic shoes in the past and replacement at this time. Patient will also follow up with palliative care is needed    Review of Systems not done at this time    Objective:   Physical Exam Vascular status intact pedal pulses palpable DP pulse two fourths PT thready one over four bilateral skin temperature warm turgor diminished mild edema +1 over or varicosities noted. Epicritic sensation diminished on Semmes Weinstein testing bilateral forefoot and digits there is normal plantar response. DTRs not elicited. Hair growth is absent there is diffuse keratoses first MTP bilateral secondary to HAV deformity and digital contractures semirigid bilateral 2 through 5       Assessment & Plan:  Assessment diabetes with neuropathy deformities and cocking factors. Follow palliative nail care in the future as recommended. At this time diabetic accident shoes one pair and 3 pairs of dual density Plastizote inlays are dispensed. The shoes and lace fit and contour well to the patient's foot. With full contact being noted on exam. Followup in the next one month to 2 months for palliative nail care next  Alvan Dame DPM

## 2012-12-11 NOTE — Procedures (Addendum)
[facility] [address] [place] [address] [phone]  Cardiology Nuclear Med Study  [name] is a 79 y.o. male     MRN : [id number]     DOB: 25-Jun-1933  Procedure Date: 12/11/2012  Nuclear Med Background Indication for Stress Test:  Evaluation for Ischemia, Graft Patency and Stent Patency History:  COPD and CAD;CABG X5--04/1996;STENT/PTCA;DIASTOLIC CHF; Cardiac Risk Factors: Carotid Disease, History of Smoking, Hypertension, IDDM Type 2, Lipids, Obesity and PVD  Symptoms:  Chest Pain, Dizziness, DOE, Fatigue, Light-Headedness, SOB and BIL ARM AND SHOULDER PAIN   Nuclear Pre-Procedure Caffeine/Decaff Intake:  9:00pm NPO After: 7:00am   IV Site: R Hand  IV 0.9% NS with Angio Cath:  22g  Chest Size (in):  46"  IV Started by: Emmit Pomfret, RN  Height: 5\' 11"  (1.803 m)  Cup Size: n/a  BMI:  Body mass index is 38.65 kg/(m^2). Weight:  277 lb (125.646 kg)   Tech Comments:  N/A    Nuclear Med Study 1 or 2 day study: 1 day  Stress Test Type:  Lexiscan  Order Authorizing Provider:  Nanetta Batty, MD   Resting Radionuclide: Technetium 103m Sestamibi  Resting Radionuclide Dose: 10.7 mCi   Stress Radionuclide:  Technetium 56m Sestamibi  Stress Radionuclide Dose: 29.8 mCi           Stress Protocol Rest HR: 55 Stress HR: 69  Rest BP:146/68 Stress BP: 150/62  Exercise Time (min): n/a METS: n/a          Dose of Adenosine (mg):  n/a Dose of Lexiscan: 0.4 mg  Dose of Atropine (mg): n/a Dose of Dobutamine: n/a mcg/kg/min (at max HR)  Stress Test Technologist: Ernestene Mention, CCT Nuclear Technologist: Koren Shiver, CNMT   Rest Procedure:  Myocardial perfusion imaging was performed at rest 45 minutes following the intravenous administration of Technetium 13m Sestamibi. Stress Procedure:  The patient received IV Lexiscan 0.4 mg over 15-seconds.  Technetium 49m Sestamibi injected at 30-seconds.  Due to patient's  shortness of breath and head pain, he was given IV Aminophylline 75 mg. Symptoms were resolved during recovery. There were no significant changes with Lexiscan.  Quantitative spect images were obtained after a 45 minute delay.  Transient Ischemic Dilatation (Normal <1.22):  1.02 Lung/Heart Ratio (Normal <0.45):  0.42 QGS EDV:  107 ml QGS ESV:  50 ml LV Ejection Fraction: 53%  Signed by Koren Shiver, CNMT  PHYSICIAN INTERPRETATION  Rest ECG: NSR with non-specific ST-T wave changes  Stress ECG: No significant change from baseline ECG, No significant ST segment change suggestive of ischemia. and There are scattered PVCs.  QPS Raw Data Images:  Acquisition technically good; normal left ventricular size. Stress Images:  There is decreased uptake in the inferior wall. There is decreased uptake in the lateral wall.    There is a small to medium sized, moderate intensity perfusion defect involving the basal to apical inferolateral wall. There is partial reversibility in this area. Rest Images:  There is decreased uptake in the lateral wall. There is decreased uptake in the inferior wall. Most notable defect is in the apical inferolateral wall. Subtraction (SDS):  There is a fixed defect that is most consistent with a previous infarction.  There is mild reversibility noted in the basal to mid inferolateral wall, these findings are consistent with ischemia.    Impression Exercise Capacity:  Lexiscan with no exercise. BP Response:  Normal blood pressure response. Clinical Symptoms:  Mild  chest pain/dyspnea. ECG Impression:  No significant ECG changes with Lexiscan. There are scattered PVCs.  Comparison with Prior Nuclear Study: No significant change from previous study - the report is similar, but the defect appears more pronounced than the most recent study.  Very similar to the 2012 study. LV Wall Motion:  Low normal overall function with inferolateral hypokinesis (poor thickening)  Overall  Impression:  Low risk stress nuclear study with persistent inferolateral infarct with mild ischemia. This is consistent with known Right Posterolateral and Circumflex-OM disease. Clinical correlation is warranted - there is evidence of peri-infarct ischemia  HARDING,DAVID W, MD  12/11/2012 1:09 PM

## 2012-12-13 ENCOUNTER — Encounter: Payer: Self-pay | Admitting: Cardiovascular Disease

## 2012-12-13 ENCOUNTER — Ambulatory Visit (INDEPENDENT_AMBULATORY_CARE_PROVIDER_SITE_OTHER): Payer: Medicare Other | Admitting: Cardiovascular Disease

## 2012-12-13 VITALS — BP 122/72 | HR 68 | Ht 71.0 in | Wt 278.0 lb

## 2012-12-13 DIAGNOSIS — R5383 Other fatigue: Secondary | ICD-10-CM

## 2012-12-13 DIAGNOSIS — Z01818 Encounter for other preprocedural examination: Secondary | ICD-10-CM

## 2012-12-13 DIAGNOSIS — D689 Coagulation defect, unspecified: Secondary | ICD-10-CM

## 2012-12-13 DIAGNOSIS — I2510 Atherosclerotic heart disease of native coronary artery without angina pectoris: Secondary | ICD-10-CM

## 2012-12-13 DIAGNOSIS — Z79899 Other long term (current) drug therapy: Secondary | ICD-10-CM

## 2012-12-13 DIAGNOSIS — R5381 Other malaise: Secondary | ICD-10-CM

## 2012-12-13 DIAGNOSIS — R0602 Shortness of breath: Secondary | ICD-10-CM

## 2012-12-13 NOTE — Patient Instructions (Signed)
Your physician has requested that you have a cardiac catheterization. Cardiac catheterization is used to diagnose and/or treat various heart conditions. Doctors may recommend this procedure for a number of different reasons. The most common reason is to evaluate chest pain. Chest pain can be a symptom of coronary artery disease (CAD), and cardiac catheterization can show whether plaque is narrowing or blocking your heart's arteries. This procedure is also used to evaluate the valves, as well as measure the blood flow and oxygen levels in different parts of your heart. For further information please visit [email or web address]   Following your catheterization, you will not be allowed to drive for 3 days.  No lifting, pushing, or pulling greater that 10 pounds is allowed for 1 week.  When the procedure is scheduled, you will be given a date to have bloodwork and a chest xray done.     You will need to have an echocardiogram on Monday, prior to your cardiac catheterization.

## 2012-12-13 NOTE — Assessment & Plan Note (Signed)
It has been 6 years since Mr. Hawes is left cardiac catheterization which I performed/1/08. He had an occluded vein graft to circumflex obtuse marginal branch with occluded native circumflex. A patent vein to the dominant RCA and a patent LIMA to the LAD with normal LV function. He had mild infero-basal hypokinesia. Recent Myoview performed last week showed inferolateral scar with peri-infarct ischemia similar to prior functional studies. Patient gives a compelling story of progressive dyspnea on exertion, neck and shoulder discomfort with exertion that resolves with rest. Of concern as is his anginal equivalent. He also complains of dizziness and has an atretic right vertebral artery and a stented left vertebral artery. Based on this I'm going to get a 2-D echo for LV function and proceed with cardiac catheterization as an outpatient at which time we will image his vertebral artery as well.

## 2012-12-13 NOTE — Progress Notes (Signed)
12/13/2012 [name]   August 22, 1933  [id number]  Primary Physician [name] Maudlin, MD Primary Cardiologist: [name] Gess MD [name]   HPI:  The patient is 79 year old moderately overweight married Caucasian male father of 1 child (1 deceased), grandfather to 1 stepchild who is accompanied by his wife and daughter today. 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 He had coronary artery bypass grafting in March 1998 with a LIMA to his LAD, a vein to the circumflex obtuse marginal branch, a vein to the right coronary artery. He also had left vertebral artery stenting by Dr. Antony Contras in [place] after his surgery in May 1998. His other problems include hypertension, hyperlipidemia, non-insulin-dependent diabetes and COPD. He has moderate renal insufficiency and was taken off ACE. He has obstructive sleep apnea on CPAP and is compliant. Cath performed May 29, 2006 revealed a patent LIMA to the LAD, 90% apical LAD lesion, total native circumflex and RCA with occluded vein to the OM branch, and a patent vein to the distal right. His vertebral stent was widely patent at that time. His EF was 55% with mild inferobasal hypokinesia April 2013. His last Myoview performed March 2014 revealed mild inferolateral ischemia explainable by his anatomy. It was a low risk study. Dr. Juanetta [name] follows his lipid profile. He is in the office today complaining of exertional bilateral shoulder pain and SOB. He says when ever he walks to get the mail or takes the trash to the street he get SOB and has pain across his shoulders. He has to stop and rest. His symptoms seems to be increasing in severity and take longer to relieve with rest. A Myoview stress test was performed several days ago that showed inferolateral scar with moderate peri-infarct ischemia. The patient's story is compelling for angina/anginal equivalent. Based on this am going to get a 2-D echocardiogram and proceed with outpatient diagnostic  coronary angiography. His most recent serum creatinine was 1.6.    Current Outpatient Prescriptions  Medication Sig Dispense Refill  . allopurinol (ZYLOPRIM) 300 MG tablet Take 150 mg by mouth at bedtime.       [name] amLODipine (NORVASC) 5 MG tablet Take 1 tablet (5 mg total) by mouth daily.  180 tablet  3  . aspirin EC 81 MG tablet Take 81 mg by mouth every morning.       . Cholecalciferol (VITAMIN D3) 1000 UNITS CAPS Take 1 capsule by mouth 2 (two) times daily.       . clopidogrel (PLAVIX) 75 MG tablet TAKE ONE TABLET DAILY.  90 tablet  1  . gabapentin (NEURONTIN) 300 MG capsule Take 300 mg by mouth 4 (four) times daily as needed (takes three times daily but may take one in between if needed).      [name] glipiZIDE (GLUCOTROL) 10 MG tablet Take 10 mg by mouth 2 (two) times daily.       [name] HYDROcodone-acetaminophen (NORCO) 10-325 MG per tablet Take 1 tablet by mouth 4 (four) times daily as needed for pain.      [name] insulin glargine (LANTUS) 100 UNIT/ML injection Inject 44 Units into the skin 2 (two) times daily.       . isosorbide dinitrate (ISORDIL) 20 MG tablet Take 1 tablet (20 mg total) by mouth 3 (three) times daily.  60 tablet  6  . metoprolol tartrate (LOPRESSOR) 25 MG tablet Take 0.5 tablets (12.5 mg total) by mouth 2 (two) times daily.  90 tablet  1  .  nitroGLYCERIN (NITROSTAT) 0.4 MG SL tablet Place 1 tablet (0.4 mg total) under the tongue every 5 (five) minutes as needed for chest pain.  25 tablet  2  . omeprazole (PRILOSEC) 20 MG capsule Take 20 mg by mouth 2 (two) times daily.       . simvastatin (ZOCOR) 40 MG tablet Take 40 mg by mouth at bedtime.      . [name] (JANUVIA) 100 MG tablet Take 50 mg by mouth 2 (two) times daily.       . Tiotropium Bromide Monohydrate (SPIRIVA HANDIHALER IN) Inhale 1 capsule into the lungs every morning.       . triamcinolone cream (KENALOG) 0.1 %       . albuterol (PROVENTIL HFA;VENTOLIN HFA) 108 (90 BASE) MCG/ACT inhaler Inhale 2 puffs into the lungs  every 6 (six) hours as needed for wheezing or shortness of breath.  1 Inhaler  2  . albuterol (PROVENTIL) (5 MG/ML) 0.5% nebulizer solution Take 0.5 mLs (2.5 mg total) by nebulization 2 (two) times daily.  20 mL  3  . furosemide (LASIX) 20 MG tablet Take 20 mg by mouth 2 (two) times daily.      [name] levothyroxine (SYNTHROID, LEVOTHROID) 175 MCG tablet Take 1 tablet (175 mcg total) by mouth daily before breakfast.  30 tablet  3   No current facility-administered medications for this visit.    Allergies  Allergen Reactions  . Demerol Nausea And Vomiting    History   Social History  . Marital Status: Married    Spouse Name: N/A    Number of Children: N/A  . Years of Education: N/A   Occupational History  . retired    Social History Main Topics  . Smoking status: Former Smoker    Quit date: 12/09/1985  . Smokeless tobacco: Not on file  . Alcohol Use: No  . Drug Use: No  . Sexual Activity: Not on file   Other Topics Concern  . Not on file   Social History Narrative  . No narrative on file     Review of Systems: General: negative for chills, fever, night sweats or weight changes.  Cardiovascular: negative for chest pain, dyspnea on exertion, edema, orthopnea, palpitations, paroxysmal nocturnal dyspnea or shortness of breath Dermatological: negative for rash Respiratory: negative for cough or wheezing Urologic: negative for hematuria Abdominal: negative for nausea, vomiting, diarrhea, bright red blood per rectum, melena, or hematemesis Neurologic: negative for visual changes, syncope, or dizziness All other systems reviewed and are otherwise negative except as noted above.    Blood pressure 122/72, pulse 68, height 5\' 11"  (1.803 m), weight 278 lb (126.1 kg).  General appearance: alert and no distress Neck: no adenopathy, no carotid bruit, no JVD, supple, symmetrical, trachea midline and thyroid not enlarged, symmetric, no tenderness/mass/nodules Lungs: clear to  auscultation bilaterally Heart: regular rate and rhythm, S1, S2 normal, no murmur, click, rub or gallop Extremities: extremities normal, atraumatic, no cyanosis or edema  EKG not performed today  ASSESSMENT AND PLAN:   CAD, CABG '98, cath '08, low risk Myoview Feb 2012 It has been 6 years since Mr. Ruark is left cardiac catheterization which I performed/1/08. He had an occluded vein graft to circumflex obtuse marginal branch with occluded native circumflex. A patent vein to the dominant RCA and a patent LIMA to the LAD with normal LV function. He had mild infero-basal hypokinesia. Recent Myoview performed last week showed inferolateral scar with peri-infarct ischemia similar to prior functional studies. Patient gives  a compelling story of progressive dyspnea on exertion, neck and shoulder discomfort with exertion that resolves with rest. Of concern as is his anginal equivalent. He also complains of dizziness and has an atretic right vertebral artery and a stented left vertebral artery. Based on this I'm going to get a 2-D echo for LV function and proceed with cardiac catheterization as an outpatient at which time we will image his vertebral artery as well.      [name] Gess MD FACP,FACC,FAHA, [facility] 12/13/2012 2:01 PM

## 2012-12-16 ENCOUNTER — Ambulatory Visit: Payer: Medicare Other | Admitting: Cardiovascular Disease

## 2012-12-17 ENCOUNTER — Encounter (HOSPITAL_COMMUNITY): Payer: Self-pay | Admitting: Emergency Medicine

## 2012-12-17 ENCOUNTER — Ambulatory Visit: Payer: Medicare Other | Admitting: Cardiology

## 2012-12-17 ENCOUNTER — Emergency Department (HOSPITAL_COMMUNITY): Payer: Medicare Other

## 2012-12-17 ENCOUNTER — Ambulatory Visit (HOSPITAL_BASED_OUTPATIENT_CLINIC_OR_DEPARTMENT_OTHER)
Admission: RE | Admit: 2012-12-17 | Discharge: 2012-12-17 | Disposition: A | Discharge status: Home / Self Care | Payer: Medicare Other | Source: Ambulatory Visit | Attending: Cardiovascular Disease | Admitting: Cardiovascular Disease

## 2012-12-17 ENCOUNTER — Observation Stay (HOSPITAL_COMMUNITY)
Admission: EM | Admit: 2012-12-17 | Discharge: 2012-12-18 | Disposition: A | Discharge status: Home Health | Payer: Medicare Other | Attending: Internal Medicine | Admitting: Internal Medicine

## 2012-12-17 DIAGNOSIS — N189 Chronic kidney disease, unspecified: Secondary | ICD-10-CM | POA: Insufficient documentation

## 2012-12-17 DIAGNOSIS — R5381 Other malaise: Secondary | ICD-10-CM | POA: Insufficient documentation

## 2012-12-17 DIAGNOSIS — I5033 Acute on chronic diastolic (congestive) heart failure: Secondary | ICD-10-CM

## 2012-12-17 DIAGNOSIS — E1142 Type 2 diabetes mellitus with diabetic polyneuropathy: Secondary | ICD-10-CM | POA: Insufficient documentation

## 2012-12-17 DIAGNOSIS — R42 Dizziness and giddiness: Principal | ICD-10-CM

## 2012-12-17 DIAGNOSIS — J4489 Other specified chronic obstructive pulmonary disease: Secondary | ICD-10-CM | POA: Insufficient documentation

## 2012-12-17 DIAGNOSIS — R0602 Shortness of breath: Secondary | ICD-10-CM

## 2012-12-17 DIAGNOSIS — I2510 Atherosclerotic heart disease of native coronary artery without angina pectoris: Secondary | ICD-10-CM | POA: Insufficient documentation

## 2012-12-17 DIAGNOSIS — R531 Weakness: Secondary | ICD-10-CM

## 2012-12-17 DIAGNOSIS — I129 Hypertensive chronic kidney disease with stage 1 through stage 4 chronic kidney disease, or unspecified chronic kidney disease: Secondary | ICD-10-CM | POA: Insufficient documentation

## 2012-12-17 DIAGNOSIS — I10 Essential (primary) hypertension: Secondary | ICD-10-CM

## 2012-12-17 DIAGNOSIS — E1149 Type 2 diabetes mellitus with other diabetic neurological complication: Secondary | ICD-10-CM | POA: Insufficient documentation

## 2012-12-17 DIAGNOSIS — E039 Hypothyroidism, unspecified: Secondary | ICD-10-CM | POA: Insufficient documentation

## 2012-12-17 DIAGNOSIS — G4733 Obstructive sleep apnea (adult) (pediatric): Secondary | ICD-10-CM | POA: Insufficient documentation

## 2012-12-17 DIAGNOSIS — J449 Chronic obstructive pulmonary disease, unspecified: Secondary | ICD-10-CM

## 2012-12-17 DIAGNOSIS — N179 Acute kidney failure, unspecified: Secondary | ICD-10-CM | POA: Insufficient documentation

## 2012-12-17 DIAGNOSIS — Z79899 Other long term (current) drug therapy: Secondary | ICD-10-CM | POA: Insufficient documentation

## 2012-12-17 DIAGNOSIS — E119 Type 2 diabetes mellitus without complications: Secondary | ICD-10-CM | POA: Diagnosis present

## 2012-12-17 DIAGNOSIS — G4730 Sleep apnea, unspecified: Secondary | ICD-10-CM

## 2012-12-17 DIAGNOSIS — K219 Gastro-esophageal reflux disease without esophagitis: Secondary | ICD-10-CM | POA: Insufficient documentation

## 2012-12-17 HISTORY — DX: Obstructive sleep apnea (adult) (pediatric): G47.33

## 2012-12-17 HISTORY — DX: Unspecified chronic bronchitis: J42

## 2012-12-17 HISTORY — DX: Calculus of kidney: N20.0

## 2012-12-17 HISTORY — DX: Dependence on other enabling machines and devices: Z99.89

## 2012-12-17 HISTORY — DX: Gout, unspecified: M10.9

## 2012-12-17 LAB — TROPONIN I
Troponin I: <0.3 ng/mL (ref <0.30)
Troponin I: <0.3 ng/mL (ref <0.30)

## 2012-12-17 LAB — CBC WITH DIFFERENTIAL/PLATELET
Basophils Absolute: 0 10*3/uL (ref 0.0–0.1)
Basophils Relative: 0 % (ref 0–1)
Eosinophils Absolute: 0.3 10*3/uL (ref 0.0–0.7)
Lymphocytes Relative: 17 % (ref 12–46)
MCHC: 33.7 g/dL (ref 30.0–36.0)
MCV: 98.7 fL (ref 78.0–100.0)
Monocytes Relative: 11 % (ref 3–12)
Neutro Abs: 7.4 10*3/uL (ref 1.7–7.7)
Platelets: 203 10*3/uL (ref 150–400)
RDW: 14.5 % (ref 11.5–15.5)
WBC: 10.6 10*3/uL — ABNORMAL HIGH (ref 4.0–10.5)

## 2012-12-17 LAB — URINALYSIS, ROUTINE W REFLEX MICROSCOPIC
Bilirubin Urine: NEGATIVE
Glucose, UA: NEGATIVE mg/dL
Ketones, ur: NEGATIVE mg/dL
Nitrite: NEGATIVE
Specific Gravity, Urine: 1.011 (ref 1.005–1.030)
Urobilinogen, UA: 0.2 mg/dL (ref 0.0–1.0)
pH: 6 (ref 5.0–8.0)

## 2012-12-17 LAB — BASIC METABOLIC PANEL
BUN: 22 mg/dL (ref 6–23)
CO2: 30 mEq/L (ref 19–32)
Calcium: 9.8 mg/dL (ref 8.4–10.5)
Chloride: 102 mEq/L (ref 96–112)
Creatinine, Ser: 1.87 mg/dL — ABNORMAL HIGH (ref 0.50–1.35)
GFR calc Af Amer: 39 mL/min — ABNORMAL LOW (ref >90)
GFR calc non Af Amer: 33 mL/min — ABNORMAL LOW (ref >90)
Sodium: 141 mEq/L (ref 135–145)

## 2012-12-17 LAB — URINE MICROSCOPIC-ADD ON

## 2012-12-17 LAB — GLUCOSE, CAPILLARY: Glucose-Capillary: 118 mg/dL — ABNORMAL HIGH (ref 70–99)

## 2012-12-17 LAB — PROTIME-INR
INR: 1.01 (ref 0.00–1.49)
Prothrombin Time: 13.1 seconds (ref 11.6–15.2)

## 2012-12-17 LAB — APTT: aPTT: 35 seconds (ref 24–37)

## 2012-12-17 MED ORDER — ISOSORBIDE DINITRATE 20 MG PO TABS
20.0000 mg | ORAL_TABLET | Freq: Three times a day (TID) | ORAL | Status: DC
  Administered 2012-12-17 – 2012-12-18 (×2): 20 mg via ORAL
  Filled 2012-12-17 (×4): qty 1

## 2012-12-17 MED ORDER — GABAPENTIN 300 MG PO CAPS
300.0000 mg | ORAL_CAPSULE | Freq: Four times a day (QID) | ORAL | Status: DC | PRN
  Administered 2012-12-17: 21:00:00 300 mg via ORAL
  Filled 2012-12-17: qty 1

## 2012-12-17 MED ORDER — HYDROCODONE-ACETAMINOPHEN 10-325 MG PO TABS
1.0000 | ORAL_TABLET | Freq: Four times a day (QID) | ORAL | Status: DC | PRN
  Administered 2012-12-17: 1 via ORAL
  Filled 2012-12-17: qty 1

## 2012-12-17 MED ORDER — AMLODIPINE BESYLATE 5 MG PO TABS
5.0000 mg | ORAL_TABLET | Freq: Every day | ORAL | Status: DC
  Administered 2012-12-18: 11:00:00 5 mg via ORAL
  Filled 2012-12-17: qty 1

## 2012-12-17 MED ORDER — LINAGLIPTIN 5 MG PO TABS
5.0000 mg | ORAL_TABLET | Freq: Every day | ORAL | Status: DC
  Administered 2012-12-18: 11:00:00 5 mg via ORAL
  Filled 2012-12-17: qty 1

## 2012-12-17 MED ORDER — SODIUM CHLORIDE 0.9 % IV SOLN
INTRAVENOUS | Status: DC
  Administered 2012-12-17: 19:00:00 via INTRAVENOUS

## 2012-12-17 MED ORDER — INSULIN GLARGINE 100 UNIT/ML ~~LOC~~ SOLN
44.0000 [IU] | Freq: Two times a day (BID) | SUBCUTANEOUS | Status: DC
  Administered 2012-12-17 – 2012-12-18 (×2): 44 [IU] via SUBCUTANEOUS
  Filled 2012-12-17 (×3): qty 0.44

## 2012-12-17 MED ORDER — GLIPIZIDE 10 MG PO TABS
10.0000 mg | ORAL_TABLET | Freq: Two times a day (BID) | ORAL | Status: DC
  Administered 2012-12-18: 07:00:00 10 mg via ORAL
  Filled 2012-12-17 (×3): qty 1

## 2012-12-17 MED ORDER — METOPROLOL TARTRATE 12.5 MG HALF TABLET
12.5000 mg | ORAL_TABLET | Freq: Two times a day (BID) | ORAL | Status: DC
  Administered 2012-12-18: 11:00:00 12.5 mg via ORAL
  Filled 2012-12-17 (×3): qty 1

## 2012-12-17 MED ORDER — ATORVASTATIN CALCIUM 20 MG PO TABS
20.0000 mg | ORAL_TABLET | Freq: Every day | ORAL | Status: DC
  Administered 2012-12-17: 20 mg via ORAL
  Filled 2012-12-17 (×2): qty 1

## 2012-12-17 MED ORDER — SODIUM CHLORIDE 0.9 % IJ SOLN: 3.0000 mL | Freq: Two times a day (BID) | INTRAMUSCULAR | Status: DC

## 2012-12-17 MED ORDER — ASPIRIN EC 81 MG PO TBEC
81.0000 mg | DELAYED_RELEASE_TABLET | Freq: Every morning | ORAL | Status: DC
  Administered 2012-12-18: 11:00:00 81 mg via ORAL
  Filled 2012-12-17: qty 1

## 2012-12-17 MED ORDER — ONDANSETRON HCL 4 MG PO TABS: 4.0000 mg | ORAL_TABLET | Freq: Four times a day (QID) | ORAL | Status: DC | PRN

## 2012-12-17 MED ORDER — TIOTROPIUM BROMIDE MONOHYDRATE 18 MCG IN CAPS
18.0000 ug | ORAL_CAPSULE | Freq: Every day | RESPIRATORY_TRACT | Status: DC
  Administered 2012-12-18: 18 ug via RESPIRATORY_TRACT
  Filled 2012-12-17: qty 5

## 2012-12-17 MED ORDER — SODIUM CHLORIDE 0.9 % IV BOLUS (SEPSIS)
500.0000 mL | INTRAVENOUS | Status: AC
  Administered 2012-12-17: 500 mL via INTRAVENOUS

## 2012-12-17 MED ORDER — INSULIN ASPART 100 UNIT/ML ~~LOC~~ SOLN: 0.0000 [IU] | Freq: Every day | SUBCUTANEOUS | Status: DC

## 2012-12-17 MED ORDER — ALLOPURINOL 300 MG PO TABS
300.0000 mg | ORAL_TABLET | Freq: Every day | ORAL | Status: DC
  Administered 2012-12-17: 300 mg via ORAL
  Filled 2012-12-17 (×2): qty 1

## 2012-12-17 MED ORDER — INSULIN ASPART 100 UNIT/ML ~~LOC~~ SOLN
0.0000 [IU] | Freq: Three times a day (TID) | SUBCUTANEOUS | Status: DC
  Administered 2012-12-18: 3 [IU] via SUBCUTANEOUS
  Administered 2012-12-18: 07:00:00 2 [IU] via SUBCUTANEOUS

## 2012-12-17 MED ORDER — ONDANSETRON HCL 4 MG/2ML IJ SOLN: 4.0000 mg | Freq: Four times a day (QID) | INTRAMUSCULAR | Status: DC | PRN

## 2012-12-17 MED ORDER — NITROGLYCERIN 0.4 MG SL SUBL: 0.4000 mg | SUBLINGUAL_TABLET | SUBLINGUAL | Status: DC | PRN

## 2012-12-17 MED ORDER — PANTOPRAZOLE SODIUM 40 MG PO TBEC
40.0000 mg | DELAYED_RELEASE_TABLET | Freq: Every day | ORAL | Status: DC
  Administered 2012-12-18: 40 mg via ORAL
  Filled 2012-12-17: qty 1

## 2012-12-17 MED ORDER — SIMVASTATIN 40 MG PO TABS: 40.0000 mg | ORAL_TABLET | Freq: Every day | ORAL | Status: DC

## 2012-12-17 NOTE — ED Notes (Signed)
Pt ambulated to bathroom with cane and standby assist. Pt denies dizziness but reports generalized weakness and unsteadiness.

## 2012-12-17 NOTE — Progress Notes (Signed)
2D Echo Performed 12/17/2012    Elisandro Jarrett, RCS  

## 2012-12-17 NOTE — ED Notes (Signed)
Attempted to call report x2

## 2012-12-17 NOTE — ED Notes (Signed)
Family at bedside. 

## 2012-12-17 NOTE — ED Provider Notes (Signed)
CSN: [id number]     Arrival date & time 12/17/12  1044 History   First MD Initiated Contact with Patient 12/17/12 1052     Chief Complaint  Patient presents with  . Dizziness   (Consider location/radiation/quality/duration/timing/severity/associated sxs/prior Treatment) HPI Pt is a 79yo male with hx of CAD, COPD, and CHF sent to ED from Dr. Benay Spice, cardiology, office after pt stated he became very dizzy after sitting up from a stress test.  Pt states he was having an echocardiogram performed.  He was feeling fine, sat on the edge of the exam table for after exam but when he went to walk, he said he felt "swimmy headed" and kept bouncing of the walls because he felt so off balance.  Reports he is currently being worked up for bilateral shoulder and arm pain for the last week and is scheduled for a heart catheterization on Thursday, 10/23. Admits to not drinking much this morning before going to doctors' appointments.  States he feels thirsty.  Denies chest pain, diaphoresis, n/v/d. Denies headache, change in vision, recent falls.  Past Medical History  Diagnosis Date  . Diabetes mellitus type II   . Sleep apnea     on C-pap  . Hypothyroidism   . Hypertension   . Pneumonia   . GERD (gastroesophageal reflux disease)   . Arthritis   . Angina   . Shortness of breath     with exertion  . CHF (congestive heart failure)     diastolic  . Heart attack   . COPD (chronic obstructive pulmonary disease)   . Coronary artery disease     CABG '98, cath '08, low risk Nuc 3/14  . Obesity   . PVD (peripheral vascular disease)     Lt vertebral stent '98  . Diastolic dysfunction     grade 1 with an EF of 60-65% 4/13   Past Surgical History  Procedure Laterality Date  . Gallbladder surgery    . Back surgery    . Finger surgery  right little  . Cholecystectomy    . Coronary artery bypass graft  1998  . Tonsillectomy    . Cardiac catheterization  April 2006  . Knee arthroscopy      left   . Lumbar laminectomy/decompression microdiscectomy  04/11/2011    Procedure: LUMBAR LAMINECTOMY/DECOMPRESSION MICRODISCECTOMY 1 LEVEL;  Surgeon: [name], MD;  Location: [facility];  Service: Neurosurgery;  Laterality: Right;  RIGHT Lumbar Three-Four laminectomy with resection of synovial cyst  . Angioplasty  '99, 00, March 06   Family History  Problem Relation Age of Onset  . Arthritis    . Cancer    . Diabetes     History  Substance Use Topics  . Smoking status: Former Smoker    Quit date: 12/09/1985  . Smokeless tobacco: Not on file  . Alcohol Use: No    Review of Systems  Constitutional: Negative for fever and chills.  Eyes: Negative for photophobia and visual disturbance.  Respiratory: Negative for cough and shortness of breath.   Cardiovascular: Negative for chest pain.  Gastrointestinal: Negative for nausea, vomiting, abdominal pain and diarrhea.  Neurological: Positive for dizziness, light-headedness and headaches.  All other systems reviewed and are negative.    Allergies  Demerol  Home Medications   Current Outpatient Rx  Name  Route  Sig  Dispense  Refill  . allopurinol (ZYLOPRIM) 300 MG tablet   Oral   Take 300 mg by mouth at bedtime.          Marland Kitchen  amLODipine (NORVASC) 5 MG tablet   Oral   Take 1 tablet (5 mg total) by mouth daily.   180 tablet   3   . aspirin EC 81 MG tablet   Oral   Take 81 mg by mouth every morning.          . Cholecalciferol (VITAMIN D3) 1000 UNITS CAPS   Oral   Take 1 capsule by mouth 2 (two) times daily.          . clopidogrel (PLAVIX) 75 MG tablet      TAKE ONE TABLET DAILY.   90 tablet   1   . furosemide (LASIX) 20 MG tablet   Oral   Take 20 mg by mouth 2 (two) times daily.         Marland Kitchen gabapentin (NEURONTIN) 300 MG capsule   Oral   Take 300 mg by mouth 4 (four) times daily as needed (takes three times daily but may take one in between if needed).         Marland Kitchen glipiZIDE (GLUCOTROL) 10 MG tablet    Oral   Take 10 mg by mouth 2 (two) times daily.          Marland Kitchen HYDROcodone-acetaminophen (NORCO) 10-325 MG per tablet   Oral   Take 1 tablet by mouth 4 (four) times daily as needed for pain.         Marland Kitchen insulin glargine (LANTUS) 100 UNIT/ML injection   Subcutaneous   Inject 44 Units into the skin 2 (two) times daily.          . isosorbide dinitrate (ISORDIL) 20 MG tablet   Oral   Take 20 mg by mouth 3 (three) times daily.         . metoprolol tartrate (LOPRESSOR) 25 MG tablet   Oral   Take 0.5 tablets (12.5 mg total) by mouth 2 (two) times daily.   90 tablet   1   . omeprazole (PRILOSEC) 20 MG capsule   Oral   Take 20 mg by mouth 2 (two) times daily.          . simvastatin (ZOCOR) 40 MG tablet   Oral   Take 40 mg by mouth at bedtime.         . [name] (JANUVIA) 100 MG tablet   Oral   Take 50 mg by mouth 2 (two) times daily.          . Tiotropium Bromide Monohydrate (SPIRIVA HANDIHALER IN)   Inhalation   Inhale 1 capsule into the lungs every morning.          . triamcinolone cream (KENALOG) 0.1 %               . EXPIRED: albuterol (PROVENTIL HFA;VENTOLIN HFA) 108 (90 BASE) MCG/ACT inhaler   Inhalation   Inhale 2 puffs into the lungs every 6 (six) hours as needed for wheezing or shortness of breath.   1 Inhaler   2   . EXPIRED: albuterol (PROVENTIL) (5 MG/ML) 0.5% nebulizer solution   Nebulization   Take 0.5 mLs (2.5 mg total) by nebulization 2 (two) times daily.   20 mL   3   . EXPIRED: levothyroxine (SYNTHROID, LEVOTHROID) 175 MCG tablet   Oral   Take 1 tablet (175 mcg total) by mouth daily before breakfast.   30 tablet   3   . nitroGLYCERIN (NITROSTAT) 0.4 MG SL tablet   Sublingual   Place 1 tablet (0.4 mg total) under the tongue  every 5 (five) minutes as needed for chest pain.   25 tablet   2    BP 130/67  Pulse 56  Temp(Src) 98.3 F (36.8 C) (Oral)  Resp 20  Wt 270 lb 4 oz (122.585 kg)  BMI 37.71 kg/m2  SpO2 100% Physical  Exam  Nursing note and vitals reviewed. Constitutional: He is oriented to person, place, and time. He appears well-developed and well-nourished.  Pt lying comfortably in exam bed, NAD.   HENT:  Head: Normocephalic and atraumatic.  Mouth/Throat: Uvula is midline and oropharynx is clear and moist. Mucous membranes are dry.  Eyes: Conjunctivae and EOM are normal. Pupils are equal, round, and reactive to light. No scleral icterus.  Neck: Normal range of motion.  Cardiovascular: Normal rate and regular rhythm.   Murmur heard. Pulmonary/Chest: Effort normal and breath sounds normal. No respiratory distress. He has no wheezes. He has no rales. He exhibits no tenderness.  Distant lung sounds. No wheeze or crackles. No respiratory distress, able to speak in full sentences w/o difficulty.  Abdominal: Soft. Bowel sounds are normal. He exhibits no distension and no mass. There is no tenderness. There is no rebound and no guarding.  Musculoskeletal: Normal range of motion.  Neurological: He is alert and oriented to person, place, and time. He has normal strength. No cranial nerve deficit or sensory deficit. Coordination normal. GCS eye subscore is 4. GCS verbal subscore is 5. GCS motor subscore is 6.  Skin: Skin is warm and dry.    ED Course  Procedures (including critical care time) Labs Review Labs Reviewed  GLUCOSE, CAPILLARY - Abnormal; Notable for the following:    Glucose-Capillary 118 (*)    All other components within normal limits  CBC WITH DIFFERENTIAL - Abnormal; Notable for the following:    WBC 10.6 (*)    RBC 3.91 (*)    HCT 38.6 (*)    Monocytes Absolute 1.1 (*)    All other components within normal limits  BASIC METABOLIC PANEL - Abnormal; Notable for the following:    Glucose, Bld 103 (*)    Creatinine, Ser 1.87 (*)    GFR calc non Af Amer 33 (*)    GFR calc Af Amer 39 (*)    All other components within normal limits  TROPONIN I  PROTIME-INR  APTT  URINALYSIS, ROUTINE W  REFLEX MICROSCOPIC   Imaging Review Dg Chest 2 View  12/17/2012   CLINICAL DATA:  Cough and shortness of breath.  EXAM: CHEST  2 VIEW  COMPARISON:  Multiple priors  FINDINGS: Cardiac silhouette remains enlarged. Pulmonary vascular congestion. No change in right lung base peripheral airspace opacity. Mild additional bibasilar airspace opacities. No pleural effusion. No pneumothorax. Patient is status post median sternotomy with unchanged fractured 1st sternotomy wire. No acute osseous abnormality.  IMPRESSION: 1.  Pulmonary vascular congestion.  2. Bibasilar airspace opacities, representing atelectasis or infiltrate.   Electronically Signed   By: Jerene Dilling M.D.   On: 12/17/2012 12:34    EKG Interpretation   None       MDM   1. Dizziness    Reviewed pt's medical records and imaging via EMR, nursing notes and vital signs.  Will perform cardiac workup in ED including EKG, troponin, CXR, and basic labs- CBC and BMP.  CBG was performed at triage- 118, lower than pt's previous CBGs which have been in 200s.  Discussed pt with Dr. Jodi Mourning, pt admits to not drinking much today, will give IV and oral  rehydration then reevaluate pt.   Labs: including troponin, unremarkable.  BMP-consistent with previous labs.  CXR: pulmonary vascular congestion, difficult to determine if old or new.  Will consult [name] to see if pt is stable enough to wait for cardiac cath scheduled for Thursday, 10/23.  Pt is stable at this time. Consulted cardiology who agreed to review pt's case.  Pt was cleared over the phone to be discharged based off labs, CXR, and EKG, however when pt ambulated to bathroom, pt stated he just "didn't feel right." Stated he still felt weak and off balance.  RN stated she didn't think he could walk 24ft on his own.  Pt also requesting if aPTT and INR be run as pt needs for cath scheduled for Thursday, 10/23.  Discussed with Dr. Jodi Mourning. Will consult internal medicine to admit to  observation as clinical orthostatic.  Spoke with Dr. Benjamine Mola, internal medicine, agreed to admit pt to Tele Obs.   Junius Finner, PA-C 12/17/12 1504

## 2012-12-17 NOTE — ED Notes (Signed)
Pt to department from Dr. Benay Spice office. States that he was having a stress test done, and when he finished he felt like he could not stand up. Reports that he felt weak and dizzy. Pt reports he feels SOB a this time. Reports bypass surgery and stents.

## 2012-12-17 NOTE — ED Notes (Signed)
Attempted to call report x 1  

## 2012-12-17 NOTE — ED Notes (Signed)
Pt placed on heart monitor and pulsox

## 2012-12-17 NOTE — H&P (Addendum)
[facility] History and Physical  [name]:[id number] DOB: 29-May-1933 DOA: 12/17/2012  Referring physician: er PCP: [name], MD  Specialists: [name]  Chief Complaint: dizzy  HPI: [name] is a 79 y.o. male  Who comes in after becoming dizzy after an echocardiogram.  Patient is scheduled for a cath on Thursday.  He was laying flat- wife states the tech sat him up very quickly after the procedure and he became dizzy.  The dizziness last "quite a while".  Patient ambulates with a cane- he ambulated to the bath room in the ER but felt very weak on the way back.  Hospitalist were asked to obs for "clinical orthostatic hypotension"  I requested a bedside orthostatic be done in the ER but patient has already received IVF   At the time of this note, orthostatics had still not been done.  In the Er, labs were essentially normal (Cr 1.60--- 1.87) Patient still describes as not feeling well.  No chest pain, no SOB, +cough  Review of Systems: all systems reviewed, negative unless stated above    Past Medical History  Diagnosis Date  . Diabetes mellitus type II   . Sleep apnea     on C-pap  . Hypothyroidism   . Hypertension   . Pneumonia   . GERD (gastroesophageal reflux disease)   . Arthritis   . Angina   . Shortness of breath     with exertion  . CHF (congestive heart failure)     diastolic  . Heart attack   . COPD (chronic obstructive pulmonary disease)   . Coronary artery disease     CABG '98, cath '08, low risk Nuc 3/14  . Obesity   . PVD (peripheral vascular disease)     Lt vertebral stent '98  . Diastolic dysfunction     grade 1 with an EF of 60-65% 4/13   Past Surgical History  Procedure Laterality Date  . Gallbladder surgery    . Back surgery    . Finger surgery  right little  . Cholecystectomy    . Coronary artery bypass graft  1998  . Tonsillectomy    . Cardiac catheterization  April 2006  . Knee arthroscopy      left  . Lumbar  laminectomy/decompression microdiscectomy  04/11/2011    Procedure: LUMBAR LAMINECTOMY/DECOMPRESSION MICRODISCECTOMY 1 LEVEL;  Surgeon: [name], MD;  Location: [facility];  Service: Neurosurgery;  Laterality: Right;  RIGHT Lumbar Three-Four laminectomy with resection of synovial cyst  . Angioplasty  '99, 00, March 06   Social History:  reports that he quit smoking about 27 years ago. He does not have any smokeless tobacco history on file. He reports that he does not drink alcohol or use illicit drugs.   Allergies  Allergen Reactions  . Demerol Nausea And Vomiting    Family History  Problem Relation Age of Onset  . Arthritis    . Cancer    . Diabetes       Prior to Admission medications   Medication Sig Start Date End Date Taking? Authorizing Provider  allopurinol (ZYLOPRIM) 300 MG tablet Take 300 mg by mouth at bedtime.    Yes Historical Provider, MD  amLODipine (NORVASC) 5 MG tablet Take 1 tablet (5 mg total) by mouth daily. 12/09/12  Yes [name], PA-C  aspirin EC 81 MG tablet Take 81 mg by mouth every morning.    Yes Historical Provider, MD  Cholecalciferol (VITAMIN D3)  1000 UNITS CAPS Take 1 capsule by mouth 2 (two) times daily.    Yes Historical Provider, MD  clopidogrel (PLAVIX) 75 MG tablet TAKE ONE TABLET DAILY. 10/22/12  Yes [name], MD  furosemide (LASIX) 20 MG tablet Take 20 mg by mouth 2 (two) times daily. 06/12/11 12/17/12 Yes [name], MD  gabapentin (NEURONTIN) 300 MG capsule Take 300 mg by mouth 4 (four) times daily as needed (takes three times daily but may take one in between if needed).   Yes Historical Provider, MD  glipiZIDE (GLUCOTROL) 10 MG tablet Take 10 mg by mouth 2 (two) times daily.    Yes Historical Provider, MD  HYDROcodone-acetaminophen (NORCO) 10-325 MG per tablet Take 1 tablet by mouth 4 (four) times daily as needed for pain.   Yes Historical Provider, MD  insulin glargine (LANTUS) 100 UNIT/ML injection Inject 44 Units into the  skin 2 (two) times daily.    Yes Historical Provider, MD  isosorbide dinitrate (ISORDIL) 20 MG tablet Take 20 mg by mouth 3 (three) times daily. 12/09/12  Yes [name], PA-C  metoprolol tartrate (LOPRESSOR) 25 MG tablet Take 0.5 tablets (12.5 mg total) by mouth 2 (two) times daily. 11/27/12  Yes [name], MD  omeprazole (PRILOSEC) 20 MG capsule Take 20 mg by mouth 2 (two) times daily.    Yes Historical Provider, MD  simvastatin (ZOCOR) 40 MG tablet Take 40 mg by mouth at bedtime.   Yes Historical Provider, MD  [name] (JANUVIA) 100 MG tablet Take 50 mg by mouth 2 (two) times daily.    Yes Historical Provider, MD  Tiotropium Bromide Monohydrate (SPIRIVA HANDIHALER IN) Inhale 1 capsule into the lungs every morning.    Yes Historical Provider, MD  triamcinolone cream (KENALOG) 0.1 %  09/25/12  Yes Historical Provider, MD  albuterol (PROVENTIL HFA;VENTOLIN HFA) 108 (90 BASE) MCG/ACT inhaler Inhale 2 puffs into the lungs every 6 (six) hours as needed for wheezing or shortness of breath. 06/12/11 08/05/12  [name], MD  albuterol (PROVENTIL) (5 MG/ML) 0.5% nebulizer solution Take 0.5 mLs (2.5 mg total) by nebulization 2 (two) times daily. 06/12/11 08/05/12  [name], MD  levothyroxine (SYNTHROID, LEVOTHROID) 175 MCG tablet Take 1 tablet (175 mcg total) by mouth daily before breakfast. 06/12/11 08/05/12  [name], MD  nitroGLYCERIN (NITROSTAT) 0.4 MG SL tablet Place 1 tablet (0.4 mg total) under the tongue every 5 (five) minutes as needed for chest pain. 12/09/12   [name], PA-C   Physical Exam: Filed Vitals:   12/17/12 1510  BP: 146/52  Pulse: 60  Temp:   Resp: 23     General: sitting on the side of the bed  Eyes: wnl  ENT: wnl  Neck: supple  Cardiovascular: rrr- mildly brady  Respiratory: clear anterior  Abdomen: +BS, soft  Skin: no rashes or lesions  Musculoskeletal: moves all 4 ext  Psychiatric: normal mood/affect  Neurologic: CN 2-12  intact  Labs on Admission:  Basic Metabolic Panel:  Recent Labs Lab 12/17/12 1140  NA 141  K 4.2  CL 102  CO2 30  GLUCOSE 103*  BUN 22  CREATININE 1.87*  CALCIUM 9.8   Liver Function Tests: No results found for this basename: AST, ALT, ALKPHOS, BILITOT, PROT, ALBUMIN,  in the last 168 hours No results found for this basename: LIPASE, AMYLASE,  in the last 168 hours No results found for this basename: AMMONIA,  in the last 168 hours CBC:  Recent Labs Lab 12/17/12 1140  WBC 10.6*  NEUTROABS 7.4  HGB 13.0  HCT 38.6*  MCV 98.7  PLT 203   Cardiac Enzymes:  Recent Labs Lab 12/17/12 1143  TROPONINI <0.30    BNP (last 3 results) No results found for this basename: PROBNP,  in the last 8760 hours CBG:  Recent Labs Lab 12/17/12 1107  GLUCAP 118*    Radiological Exams on Admission: Dg Chest 2 View  12/17/2012   CLINICAL DATA:  Cough and shortness of breath.  EXAM: CHEST  2 VIEW  COMPARISON:  Multiple priors  FINDINGS: Cardiac silhouette remains enlarged. Pulmonary vascular congestion. No change in right lung base peripheral airspace opacity. Mild additional bibasilar airspace opacities. No pleural effusion. No pneumothorax. Patient is status post median sternotomy with unchanged fractured 1st sternotomy wire. No acute osseous abnormality.  IMPRESSION: 1.  Pulmonary vascular congestion.  2. Bibasilar airspace opacities, representing atelectasis or infiltrate.   Electronically Signed   By: Jerene Dilling M.D.   On: 12/17/2012 12:34    EKG: Independently reviewed. Sinus brady  Assessment/Plan Principal Problem:   Dizziness Active Problems:   DM (diabetes mellitus)   Weak   1. Weak/dizzy- check orthostatics, check urine, check TSH, IVF-gentle; differentials could be CVA but patient has no appreciable weakness in his limbs, no focal neuro defects, will obs; cycle CE, blood sugar was 103- ?if this might feel low to patient and make him feel bad- check HgbA1C; PT  eval; ? Bronchitis- x ray not convincing 2. OSA- CPAP at night 3. Obesity 4. CAD- for cath on Thrus 5. DM- SSI and home meds 6. AKI- IVF gentle (1.60 to 1.87)- hold lasix (but back in 2013 has been as high as 2.13    Code Status: full Family Communication: patient and wife Disposition Plan: obs  Time spent: 75 min  Rainer Mounce [facility] Pager [phone]  If 7PM-7AM, please contact night-coverage [email or web address] Password [facility] 12/17/2012, 3:16 PM

## 2012-12-18 ENCOUNTER — Telehealth: Payer: Self-pay | Admitting: Cardiovascular Disease

## 2012-12-18 DIAGNOSIS — I509 Heart failure, unspecified: Secondary | ICD-10-CM

## 2012-12-18 DIAGNOSIS — I5033 Acute on chronic diastolic (congestive) heart failure: Secondary | ICD-10-CM

## 2012-12-18 DIAGNOSIS — E039 Hypothyroidism, unspecified: Secondary | ICD-10-CM

## 2012-12-18 DIAGNOSIS — J449 Chronic obstructive pulmonary disease, unspecified: Secondary | ICD-10-CM

## 2012-12-18 DIAGNOSIS — I10 Essential (primary) hypertension: Secondary | ICD-10-CM

## 2012-12-18 LAB — BASIC METABOLIC PANEL
BUN: 21 mg/dL (ref 6–23)
Creatinine, Ser: 1.69 mg/dL — ABNORMAL HIGH (ref 0.50–1.35)
GFR calc Af Amer: 44 mL/min — ABNORMAL LOW (ref >90)
GFR calc non Af Amer: 38 mL/min — ABNORMAL LOW (ref >90)
Glucose, Bld: 129 mg/dL — ABNORMAL HIGH (ref 70–99)
Potassium: 3.6 mEq/L (ref 3.5–5.1)
Sodium: 140 mEq/L (ref 135–145)

## 2012-12-18 LAB — HEMOGLOBIN A1C
Hgb A1c MFr Bld: 7.5 % — ABNORMAL HIGH (ref <5.7)
Mean Plasma Glucose: 169 mg/dL — ABNORMAL HIGH (ref <117)

## 2012-12-18 LAB — CBC
Hemoglobin: 11.9 g/dL — ABNORMAL LOW (ref 13.0–17.0)
MCH: 31.9 pg (ref 26.0–34.0)
MCHC: 32.7 g/dL (ref 30.0–36.0)
RBC: 3.73 MIL/uL — ABNORMAL LOW (ref 4.22–5.81)
RDW: 14.4 % (ref 11.5–15.5)

## 2012-12-18 LAB — TROPONIN I: Troponin I: <0.3 ng/mL (ref <0.30)

## 2012-12-18 LAB — MRSA PCR SCREENING: MRSA by PCR: POSITIVE — AB

## 2012-12-18 MED ORDER — CHLORHEXIDINE GLUCONATE CLOTH 2 % EX PADS
6.0000 | MEDICATED_PAD | Freq: Every day | CUTANEOUS | Status: DC
  Administered 2012-12-18: 07:00:00 6 via TOPICAL

## 2012-12-18 MED ORDER — MUPIROCIN 2 % EX OINT
TOPICAL_OINTMENT | Freq: Two times a day (BID) | CUTANEOUS | Status: DC
  Administered 2012-12-18: 11:00:00 via NASAL
  Filled 2012-12-18: qty 22

## 2012-12-18 NOTE — Progress Notes (Signed)
Patient evaluated for community based chronic disease management services with [facility] Care Management Program as a benefit of patient's [employer]. Spoke with patient's wife at bedside to explain [facility] Care Management services.  Wife consented to services and written consents obtained.  We have attempted to engage his care before in July 2014 without success.  His wife explained that she probably thought we where telemarketers calling.  We will request the assistance of home health for engagement and warm transfer when their services are completed.  Patient was admitted on 10.21.14 for dizziness.  He has a history for CHF, CAD, Diabetes, HTN, and COPD.  Patient will receive a post discharge transition of care call and will be evaluated for monthly home visits for assessments and disease process education.  Left contact information and [facility] literature at bedside. Made Reeves Forth RN Inpatient Case Manager aware that [facility] Care Management following. Of note, [facility] Care Management services does not replace or interfere with any services that are arranged by inpatient case management or social work.  For additional questions or referrals please contact Anibal Henderson BSN RN [facility] Liaison at [phone].

## 2012-12-18 NOTE — Progress Notes (Addendum)
DC IV, DC Tele, DC Home. Discharge instructions and home medications discussed with patient and patient's family. Patient and family denied any questions or concerns at this time. Patient leaving unit via wheel chair. Patient appears in no acute distress.

## 2012-12-18 NOTE — Discharge Summary (Signed)
Physician Discharge Summary  JKAI ARWOOD ZOX:[id number] DOB: 06/11/1933 DOA: 12/17/2012  PCP: [name], MD  Admit date: 12/17/2012 Discharge date: 12/18/2012  Time spent: >30 minutes  Recommendations for Outpatient Follow-up:  1. BMET to follow renal function and electrolytes 2. Reassess BP and adjust medications as needed 3. Follow up with cardiology as plan for further evaluation of CAD.  Discharge Diagnoses:  Principal Problem:   Dizziness Active Problems:   DM (diabetes mellitus)   Weak HTN COPD Acute on chronic renal failure CAD GERD Diabetic neuropathy Hypothyroidism   Discharge Condition: stable and improved. Will discharge home. Follow up with PCP in 10 days.  Diet recommendation: heart healthy diet  Filed Weights   12/17/12 1046 12/17/12 1700 12/18/12 0505  Weight: 122.585 kg (270 lb 4 oz) 124 kg (273 lb 5.9 oz) 124.3 kg (274 lb 0.5 oz)    History of present illness:  79 y.o. male Who comes in after becoming dizzy after an echocardiogram. Patient is scheduled for a cath on Thursday. He was laying flat- wife states the tech sat him up very quickly after the procedure and he became dizzy. The dizziness last "quite a while". Patient ambulates with a cane- he ambulated to the bath room in the ER but felt very weak on the way back. Hospitalist were asked to obs for "clinical orthostatic hypotension" I requested a bedside orthostatic be done in the ER but patient has already received IVF . At the time of this note, orthostatics had still not been done. In the Er, labs were essentially normal (Cr 1.60--- 1.87) Patient still describes as not feeling well. No chest pain, no SOB, + non productive cough   Hospital Course:  1-Dizziness/weakness: most likely due to dehydration and continue use of diuretics. After IVF's given and holding diuretics for 24 hours patient symptoms disappear and is orthostatic VS negatives at discharge. -patient renal function is also  back to baseline   -will discharge home with instructions to keep himself well hydrated -troponin neg X3, no telemetry abnormalities appreciated and normal TSH -per records reviewed will follow with cardiology for outpatient cath on 12/19/12. Further treatment and rec's per studies results.  2-OSA: continue CPAP  3-CAD: continue current medication regimen. Cath on 12/19/12 for further stratification.  4-DM: stable. Continue lantus, glipizide and januvia  5-COPD: no wheezing or SOB currently. -continue spiriva and PRN albuterol  6-GERD: continue PPI  7-diabetic neuropathy: continue neurontin  8-acute on chronic renal failure: due to dehydration. Resolved and back to baseline with IVF's. -BMET in outpatient setting during follow up visit.  9-hypothyroidism: continue synthroid. TSH WNL  *rest of medical problems remains stable and the plan is to continue current medication regimen and patient to follow with PCP for further medication adjustments.   Procedures:  See below for x-ray reports  Consultations:  None   Discharge Exam: Filed Vitals:   12/18/12 1036  BP: 167/79  Pulse: 72  Temp:   Resp:     General: NAD, reports no CP or SOB; no further dizziness Cardiovascular: S1 and S2, no rubs or gallops Respiratory: good air movement, no wheezing, no crackles Extremities: trace edema bilaterally, no cyanosis Neuro: non focal  Discharge Instructions  Discharge Orders   Future Appointments Provider Department Dept Phone   01/14/2013 2:15 PM Alvan Dame, DPM [facility] [phone]   Future Orders Complete By Expires   Discharge instructions  As directed    Comments:     Take medications as  prescribed Keep yourself well hydrated Follow a low sodium diet       Medication List         albuterol (5 MG/ML) 0.5% nebulizer solution  Commonly known as:  PROVENTIL  Take 0.5 mLs (2.5 mg total) by nebulization 2 (two) times daily.      albuterol 108 (90 BASE) MCG/ACT inhaler  Commonly known as:  PROVENTIL HFA;VENTOLIN HFA  Inhale 2 puffs into the lungs every 6 (six) hours as needed for wheezing or shortness of breath.     allopurinol 300 MG tablet  Commonly known as:  ZYLOPRIM  Take 300 mg by mouth at bedtime.     amLODipine 5 MG tablet  Commonly known as:  NORVASC  Take 1 tablet (5 mg total) by mouth daily.     aspirin EC 81 MG tablet  Take 81 mg by mouth every morning.     clopidogrel 75 MG tablet  Commonly known as:  PLAVIX  TAKE ONE TABLET DAILY.     furosemide 20 MG tablet  Commonly known as:  LASIX  Take 20 mg by mouth 2 (two) times daily.     gabapentin 300 MG capsule  Commonly known as:  NEURONTIN  Take 300 mg by mouth 4 (four) times daily as needed (takes three times daily but may take one in between if needed).     glipiZIDE 10 MG tablet  Commonly known as:  GLUCOTROL  Take 10 mg by mouth 2 (two) times daily.     HYDROcodone-acetaminophen 10-325 MG per tablet  Commonly known as:  NORCO  Take 1 tablet by mouth 4 (four) times daily as needed for pain.     insulin glargine 100 UNIT/ML injection  Commonly known as:  LANTUS  Inject 44 Units into the skin 2 (two) times daily.     isosorbide dinitrate 20 MG tablet  Commonly known as:  ISORDIL  Take 20 mg by mouth 3 (three) times daily.     levothyroxine 175 MCG tablet  Commonly known as:  SYNTHROID, LEVOTHROID  Take 1 tablet (175 mcg total) by mouth daily before breakfast.     metoprolol tartrate 25 MG tablet  Commonly known as:  LOPRESSOR  Take 0.5 tablets (12.5 mg total) by mouth 2 (two) times daily.     nitroGLYCERIN 0.4 MG SL tablet  Commonly known as:  NITROSTAT  Place 1 tablet (0.4 mg total) under the tongue every 5 (five) minutes as needed for chest pain.     omeprazole 20 MG capsule  Commonly known as:  PRILOSEC  Take 20 mg by mouth 2 (two) times daily.     simvastatin 40 MG tablet  Commonly known as:  ZOCOR  Take 40 mg by  mouth at bedtime.     [name] 100 MG tablet  Commonly known as:  JANUVIA  Take 50 mg by mouth 2 (two) times daily.     SPIRIVA HANDIHALER IN  Inhale 1 capsule into the lungs every morning.     triamcinolone cream 0.1 %  Commonly known as:  KENALOG     Vitamin D3 1000 UNITS Caps  Take 1 capsule by mouth 2 (two) times daily.       Allergies  Allergen Reactions  . Demerol Nausea And Vomiting       Follow-up Information   Follow up with HAWKINS,EDWARD L, MD. Schedule an appointment as soon as possible for a visit in 10 days.   Specialty:  Pulmonary Disease   Contact  information:   [address] [place] [address] [phone]        The results of significant diagnostics from this hospitalization (including imaging, microbiology, ancillary and laboratory) are listed below for reference.    Significant Diagnostic Studies: Dg Chest 2 View  12/17/2012   CLINICAL DATA:  Cough and shortness of breath.  EXAM: CHEST  2 VIEW  COMPARISON:  Multiple priors  FINDINGS: Cardiac silhouette remains enlarged. Pulmonary vascular congestion. No change in right lung base peripheral airspace opacity. Mild additional bibasilar airspace opacities. No pleural effusion. No pneumothorax. Patient is status post median sternotomy with unchanged fractured 1st sternotomy wire. No acute osseous abnormality.  IMPRESSION: 1.  Pulmonary vascular congestion.  2. Bibasilar airspace opacities, representing atelectasis or infiltrate.   Electronically Signed   By: Jerene Dilling M.D.   On: 12/17/2012 12:34    Microbiology: Recent Results (from the past 240 hour(s))  MRSA PCR SCREENING     Status: Abnormal   Collection Time    12/17/12  8:04 PM      Result Value Range Status   MRSA by PCR POSITIVE (*) NEGATIVE Final   Comment:            The GeneXpert MRSA Assay (FDA     approved for NASAL specimens     only), is one component of a     comprehensive MRSA colonization      surveillance program. It is not     intended to diagnose MRSA     infection nor to guide or     monitor treatment for     MRSA infections.     RESULT CALLED TO, READ BACK BY AND VERIFIED WITH:     RN A.BUENDIA AT 0229 12/18/12     RESULT CALLED TO, READ BACK BY AND VERIFIED WITH:     RN M.LEONARD,PREVIOUSLY REPORTED AS RN A.BUENDIA     Labs: Basic Metabolic Panel:  Recent Labs Lab 12/17/12 1140 12/18/12 0555  NA 141 140  K 4.2 3.6  CL 102 103  CO2 30 27  GLUCOSE 103* 129*  BUN 22 21  CREATININE 1.87* 1.69*  CALCIUM 9.8 9.0   CBC:  Recent Labs Lab 12/17/12 1140 12/18/12 0555  WBC 10.6* 10.1  NEUTROABS 7.4  --   HGB 13.0 11.9*  HCT 38.6* 36.4*  MCV 98.7 97.6  PLT 203 193   Cardiac Enzymes:  Recent Labs Lab 12/17/12 1143 12/17/12 1858 12/17/12 2325 12/18/12 0555  TROPONINI <0.30 <0.30 <0.30 <0.30   CBG:  Recent Labs Lab 12/17/12 1107 12/17/12 2050 12/18/12 0552 12/18/12 1054  GLUCAP 118* 97 132* 171*    Signed:  Shirely Toren  [facility] 12/18/2012, 1:53 PM

## 2012-12-18 NOTE — Progress Notes (Signed)
Pt MRSA PCR positive. Orders placed for contact precautions, Bactroban ointment and CHG wipes. Baron Hamper, RN

## 2012-12-18 NOTE — Plan of Care (Signed)
Problem: Phase I Progression Outcomes Goal: Up in chair, BRP Outcome: Completed/Met Date Met:  12/18/12 Pt OOB with 1 asssist and cane

## 2012-12-18 NOTE — Evaluation (Signed)
Physical Therapy Evaluation Patient Details Name: [name] MRN: [id number] DOB: 1933-12-10 Today's Date: 12/18/2012 Time: 4098-1191 PT Time Calculation (min): 23 min  PT Assessment / Plan / Recommendation History of Present Illness  79 y.o. male Who comes in after becoming dizzy after an echocardiogram.  Patient is scheduled for a cath on Thursday.  He was laying flat- wife states the tech sat him up very quickly after the procedure and he became dizzy.   Clinical Impression  **Pt admitted with dizziness*. Pt currently with functional limitations due to the deficits listed below (see PT Problem List).  Pt will benefit from skilled PT to increase their independence and safety with mobility to allow discharge to the venue listed below. Pt denied dizziness throughout PT eval.    *    PT Assessment  Patient needs continued PT services    Follow Up Recommendations  No PT follow up    Does the patient have the potential to tolerate intense rehabilitation      Barriers to Discharge        Equipment Recommendations  None recommended by PT    Recommendations for Other Services     Frequency Min 3X/week    Precautions / Restrictions Precautions Precautions: Fall Restrictions Weight Bearing Restrictions: No   Pertinent Vitals/Pain **0/10 pain SaO2 95% on RA*      Mobility  Bed Mobility Bed Mobility: Right Sidelying to Sit Right Sidelying to Sit: 6: Modified independent (Device/Increase time);With rails;HOB elevated Transfers Transfers: Sit to Stand;Stand to Sit Sit to Stand: 6: Modified independent (Device/Increase time);With upper extremity assist;From bed Stand to Sit: 6: Modified independent (Device/Increase time);With upper extremity assist;With armrests;To chair/3-in-1 Ambulation/Gait Ambulation/Gait Assistance: 5: Supervision Ambulation Distance (Feet): 180 Feet Assistive device: Rolling walker Gait Pattern: Within Functional Limits Gait velocity: WFL General  Gait Details: pt steady while walking, no dizziness, no LOB, supervision for safety 2* fall and dizziness yesterday    Exercises     PT Diagnosis: Generalized weakness  PT Problem List: Decreased activity tolerance;Obesity;Decreased mobility PT Treatment Interventions: Gait training;Stair training;Therapeutic exercise;Patient/family education;DME instruction     PT Goals(Current goals can be found in the care plan section) Acute Rehab PT Goals Patient Stated Goal: return to prior level of function -independent walking with cane PT Goal Formulation: With patient Time For Goal Achievement: 01/01/13 Potential to Achieve Goals: Good  Visit Information  Last PT Received On: 12/18/12 Assistance Needed: +1 History of Present Illness: 79 y.o. male Who comes in after becoming dizzy after an echocardiogram.  Patient is scheduled for a cath on Thursday.  He was laying flat- wife states the tech sat him up very quickly after the procedure and he became dizzy.        Prior Functioning  Home Living Family/patient expects to be discharged to:: Private residence Living Arrangements: Spouse/significant other Available Help at Discharge: Family Type of Home: House Home Access: Stairs to enter Secretary/administrator of Steps: 2 Entrance Stairs-Rails: Right Home Layout: One level Home Equipment: Environmental consultant - 2 wheels;Shower seat;Cane - single point Prior Function Level of Independence: Independent with assistive device(s) Comments: wife has had health issues recently (breast CA, back surgery), their daughter assists her with bathing Communication Communication: No difficulties    Cognition  Cognition Arousal/Alertness: Awake/alert Behavior During Therapy: WFL for tasks assessed/performed Overall Cognitive Status: Within Functional Limits for tasks assessed    Extremity/Trunk Assessment Upper Extremity Assessment Upper Extremity Assessment: Overall WFL for tasks assessed Lower Extremity  Assessment Lower Extremity  Assessment: Overall WFL for tasks assessed Cervical / Trunk Assessment Cervical / Trunk Assessment: Normal   Balance Balance Balance Assessed: Yes Static Sitting Balance Static Sitting - Balance Support: Feet supported;No upper extremity supported Static Sitting - Level of Assistance: 7: Independent Static Sitting - Comment/# of Minutes: 3  End of Session PT - End of Session Activity Tolerance: Patient tolerated treatment well Patient left: in chair;with call bell/phone within reach Nurse Communication: Mobility status  GP     Ralene Bathe Kistler 12/18/2012, 9:15 AM [phone]

## 2012-12-18 NOTE — Progress Notes (Signed)
Utilization review completed.  

## 2012-12-18 NOTE — Telephone Encounter (Signed)
Dr Allyson Sabal spoke with hospitalist.  We will proceed with the cath for tomorrow.  I spoke with patient and confirmed his procedure for tomorrow.

## 2012-12-18 NOTE — Telephone Encounter (Signed)
Getting ready to discharge patient  Scheduled for Cath tomorrow  Wants to inform of findings of admission . Family wanted to know if will change cath tomorrow.

## 2012-12-19 ENCOUNTER — Ambulatory Visit (HOSPITAL_COMMUNITY)
Admission: RE | Admit: 2012-12-19 | Discharge: 2012-12-20 | Disposition: A | Discharge status: Home / Self Care | Payer: Medicare Other | Source: Ambulatory Visit | Attending: Cardiovascular Disease | Admitting: Cardiovascular Disease

## 2012-12-19 ENCOUNTER — Encounter (HOSPITAL_COMMUNITY)
Admission: RE | Disposition: A | Discharge status: Home / Self Care | Payer: Self-pay | Source: Ambulatory Visit | Attending: Cardiovascular Disease

## 2012-12-19 ENCOUNTER — Encounter (HOSPITAL_COMMUNITY): Payer: Self-pay | Admitting: General Practice

## 2012-12-19 DIAGNOSIS — E663 Overweight: Secondary | ICD-10-CM | POA: Insufficient documentation

## 2012-12-19 DIAGNOSIS — N183 Chronic kidney disease, stage 3 unspecified: Secondary | ICD-10-CM | POA: Insufficient documentation

## 2012-12-19 DIAGNOSIS — R001 Bradycardia, unspecified: Secondary | ICD-10-CM | POA: Diagnosis present

## 2012-12-19 DIAGNOSIS — Z01818 Encounter for other preprocedural examination: Secondary | ICD-10-CM

## 2012-12-19 DIAGNOSIS — M25519 Pain in unspecified shoulder: Secondary | ICD-10-CM | POA: Insufficient documentation

## 2012-12-19 DIAGNOSIS — I2089 Other forms of angina pectoris: Secondary | ICD-10-CM | POA: Diagnosis present

## 2012-12-19 DIAGNOSIS — I5033 Acute on chronic diastolic (congestive) heart failure: Secondary | ICD-10-CM | POA: Insufficient documentation

## 2012-12-19 DIAGNOSIS — I5032 Chronic diastolic (congestive) heart failure: Secondary | ICD-10-CM | POA: Diagnosis present

## 2012-12-19 DIAGNOSIS — E785 Hyperlipidemia, unspecified: Secondary | ICD-10-CM | POA: Insufficient documentation

## 2012-12-19 DIAGNOSIS — E039 Hypothyroidism, unspecified: Secondary | ICD-10-CM | POA: Diagnosis present

## 2012-12-19 DIAGNOSIS — G4730 Sleep apnea, unspecified: Secondary | ICD-10-CM | POA: Diagnosis present

## 2012-12-19 DIAGNOSIS — G4733 Obstructive sleep apnea (adult) (pediatric): Secondary | ICD-10-CM | POA: Insufficient documentation

## 2012-12-19 DIAGNOSIS — J449 Chronic obstructive pulmonary disease, unspecified: Secondary | ICD-10-CM | POA: Insufficient documentation

## 2012-12-19 DIAGNOSIS — I208 Other forms of angina pectoris: Secondary | ICD-10-CM | POA: Diagnosis present

## 2012-12-19 DIAGNOSIS — I498 Other specified cardiac arrhythmias: Secondary | ICD-10-CM | POA: Insufficient documentation

## 2012-12-19 DIAGNOSIS — I209 Angina pectoris, unspecified: Secondary | ICD-10-CM | POA: Insufficient documentation

## 2012-12-19 DIAGNOSIS — R0602 Shortness of breath: Secondary | ICD-10-CM | POA: Insufficient documentation

## 2012-12-19 DIAGNOSIS — Z79899 Other long term (current) drug therapy: Secondary | ICD-10-CM | POA: Insufficient documentation

## 2012-12-19 DIAGNOSIS — I129 Hypertensive chronic kidney disease with stage 1 through stage 4 chronic kidney disease, or unspecified chronic kidney disease: Secondary | ICD-10-CM | POA: Insufficient documentation

## 2012-12-19 DIAGNOSIS — E119 Type 2 diabetes mellitus without complications: Secondary | ICD-10-CM | POA: Insufficient documentation

## 2012-12-19 DIAGNOSIS — J4489 Other specified chronic obstructive pulmonary disease: Secondary | ICD-10-CM | POA: Insufficient documentation

## 2012-12-19 DIAGNOSIS — I10 Essential (primary) hypertension: Secondary | ICD-10-CM

## 2012-12-19 DIAGNOSIS — R0600 Dyspnea, unspecified: Secondary | ICD-10-CM

## 2012-12-19 DIAGNOSIS — I25810 Atherosclerosis of coronary artery bypass graft(s) without angina pectoris: Secondary | ICD-10-CM | POA: Insufficient documentation

## 2012-12-19 DIAGNOSIS — E669 Obesity, unspecified: Secondary | ICD-10-CM

## 2012-12-19 DIAGNOSIS — R531 Weakness: Secondary | ICD-10-CM

## 2012-12-19 DIAGNOSIS — I509 Heart failure, unspecified: Secondary | ICD-10-CM | POA: Insufficient documentation

## 2012-12-19 DIAGNOSIS — I2510 Atherosclerotic heart disease of native coronary artery without angina pectoris: Secondary | ICD-10-CM

## 2012-12-19 HISTORY — DX: Chronic kidney disease, stage 3 (moderate): N18.3

## 2012-12-19 HISTORY — PX: LEFT HEART CATHETERIZATION WITH CORONARY ANGIOGRAM: SHX5451

## 2012-12-19 HISTORY — DX: Chronic kidney disease, stage 3 unspecified: N18.30

## 2012-12-19 HISTORY — DX: Bradycardia, unspecified: R00.1

## 2012-12-19 LAB — GLUCOSE, CAPILLARY
Glucose-Capillary: 78 mg/dL (ref 70–99)
Glucose-Capillary: 119 mg/dL — ABNORMAL HIGH (ref 70–99)

## 2012-12-19 LAB — URINE CULTURE
Colony Count: NO GROWTH
Culture: NO GROWTH

## 2012-12-19 SURGERY — LEFT HEART CATHETERIZATION WITH CORONARY ANGIOGRAM
Anesthesia: LOCAL

## 2012-12-19 MED ORDER — TIOTROPIUM BROMIDE MONOHYDRATE 18 MCG IN CAPS
18.0000 ug | ORAL_CAPSULE | Freq: Every day | RESPIRATORY_TRACT | Status: DC
  Filled 2012-12-19: qty 5

## 2012-12-19 MED ORDER — GABAPENTIN 300 MG PO CAPS
300.0000 mg | ORAL_CAPSULE | Freq: Four times a day (QID) | ORAL | Status: DC | PRN
  Administered 2012-12-20: 300 mg via ORAL
  Filled 2012-12-19: qty 1

## 2012-12-19 MED ORDER — ONDANSETRON HCL 4 MG/2ML IJ SOLN: 4.0000 mg | Freq: Four times a day (QID) | INTRAMUSCULAR | Status: DC | PRN

## 2012-12-19 MED ORDER — INSULIN GLARGINE 100 UNIT/ML ~~LOC~~ SOLN
44.0000 [IU] | Freq: Two times a day (BID) | SUBCUTANEOUS | Status: DC
  Administered 2012-12-19: 20:00:00 44 [IU] via SUBCUTANEOUS
  Filled 2012-12-19 (×4): qty 0.44

## 2012-12-19 MED ORDER — SODIUM CHLORIDE 0.9 % IV SOLN
INTRAVENOUS | Status: DC
  Administered 2012-12-19: 11:00:00 via INTRAVENOUS

## 2012-12-19 MED ORDER — INSULIN GLARGINE 100 UNIT/ML ~~LOC~~ SOLN
44.0000 [IU] | Freq: Two times a day (BID) | SUBCUTANEOUS | Status: DC
  Filled 2012-12-19: qty 0.44

## 2012-12-19 MED ORDER — MORPHINE SULFATE 2 MG/ML IJ SOLN: 1.0000 mg | INTRAMUSCULAR | Status: DC | PRN

## 2012-12-19 MED ORDER — ASPIRIN EC 81 MG PO TBEC
81.0000 mg | DELAYED_RELEASE_TABLET | Freq: Every morning | ORAL | Status: DC
  Administered 2012-12-20: 81 mg via ORAL
  Filled 2012-12-19: qty 1

## 2012-12-19 MED ORDER — HEPARIN (PORCINE) IN NACL 2-0.9 UNIT/ML-% IJ SOLN
INTRAMUSCULAR | Status: AC
  Filled 2012-12-19: qty 1000

## 2012-12-19 MED ORDER — SODIUM CHLORIDE 0.9 % IJ SOLN: 3.0000 mL | INTRAMUSCULAR | Status: DC | PRN

## 2012-12-19 MED ORDER — ASPIRIN 81 MG PO CHEW
81.0000 mg | CHEWABLE_TABLET | ORAL | Status: AC
  Administered 2012-12-19: 81 mg via ORAL
  Filled 2012-12-19: qty 1

## 2012-12-19 MED ORDER — HYDRALAZINE HCL 20 MG/ML IJ SOLN
10.0000 mg | Freq: Once | INTRAMUSCULAR | Status: AC
  Administered 2012-12-19: 16:00:00 10 mg via INTRAVENOUS
  Filled 2012-12-19: qty 1

## 2012-12-19 MED ORDER — FUROSEMIDE 20 MG PO TABS
20.0000 mg | ORAL_TABLET | Freq: Two times a day (BID) | ORAL | Status: DC
  Administered 2012-12-19 – 2012-12-20 (×2): 20 mg via ORAL
  Filled 2012-12-19 (×4): qty 1

## 2012-12-19 MED ORDER — TRIAMCINOLONE ACETONIDE 0.1 % EX CREA
TOPICAL_CREAM | Freq: Two times a day (BID) | CUTANEOUS | Status: DC
  Administered 2012-12-19: 21:00:00 via TOPICAL
  Filled 2012-12-19: qty 15

## 2012-12-19 MED ORDER — NITROGLYCERIN 0.2 MG/ML ON CALL CATH LAB
INTRAVENOUS | Status: AC
  Filled 2012-12-19: qty 1

## 2012-12-19 MED ORDER — GLIPIZIDE 10 MG PO TABS
10.0000 mg | ORAL_TABLET | Freq: Two times a day (BID) | ORAL | Status: DC
  Administered 2012-12-19 – 2012-12-20 (×2): 10 mg via ORAL
  Filled 2012-12-19 (×4): qty 1

## 2012-12-19 MED ORDER — HYDROCODONE-ACETAMINOPHEN 10-325 MG PO TABS
1.0000 | ORAL_TABLET | ORAL | Status: DC | PRN
  Administered 2012-12-20: 1 via ORAL
  Filled 2012-12-19: qty 1

## 2012-12-19 MED ORDER — AMLODIPINE BESYLATE 5 MG PO TABS
5.0000 mg | ORAL_TABLET | Freq: Every day | ORAL | Status: DC
  Filled 2012-12-19: qty 1

## 2012-12-19 MED ORDER — LINAGLIPTIN 5 MG PO TABS
5.0000 mg | ORAL_TABLET | Freq: Every day | ORAL | Status: DC
  Administered 2012-12-20: 10:00:00 5 mg via ORAL
  Filled 2012-12-19: qty 1

## 2012-12-19 MED ORDER — PANTOPRAZOLE SODIUM 40 MG PO TBEC
40.0000 mg | DELAYED_RELEASE_TABLET | Freq: Every day | ORAL | Status: DC
  Administered 2012-12-20: 40 mg via ORAL
  Filled 2012-12-19: qty 1

## 2012-12-19 MED ORDER — METOPROLOL TARTRATE 12.5 MG HALF TABLET
12.5000 mg | ORAL_TABLET | Freq: Two times a day (BID) | ORAL | Status: DC
  Administered 2012-12-19: 12.5 mg via ORAL
  Filled 2012-12-19 (×3): qty 1

## 2012-12-19 MED ORDER — CLOPIDOGREL BISULFATE 75 MG PO TABS
75.0000 mg | ORAL_TABLET | Freq: Every day | ORAL | Status: DC
  Administered 2012-12-20: 75 mg via ORAL
  Filled 2012-12-19: qty 1

## 2012-12-19 MED ORDER — DIAZEPAM 5 MG PO TABS
5.0000 mg | ORAL_TABLET | ORAL | Status: AC
  Administered 2012-12-19: 5 mg via ORAL
  Filled 2012-12-19: qty 1

## 2012-12-19 MED ORDER — ALBUTEROL SULFATE (5 MG/ML) 0.5% IN NEBU
2.5000 mg | INHALATION_SOLUTION | Freq: Two times a day (BID) | RESPIRATORY_TRACT | Status: DC
  Administered 2012-12-19: 21:00:00 2.5 mg via RESPIRATORY_TRACT
  Filled 2012-12-19 (×2): qty 0.5

## 2012-12-19 MED ORDER — ALBUTEROL SULFATE HFA 108 (90 BASE) MCG/ACT IN AERS
2.0000 | INHALATION_SPRAY | Freq: Four times a day (QID) | RESPIRATORY_TRACT | Status: DC | PRN
Start: 2012-12-19 — End: 2012-12-20
  Filled 2012-12-19: qty 6.7

## 2012-12-19 MED ORDER — ALLOPURINOL 300 MG PO TABS
300.0000 mg | ORAL_TABLET | Freq: Every day | ORAL | Status: DC
  Administered 2012-12-19: 21:00:00 300 mg via ORAL
  Filled 2012-12-19 (×2): qty 1

## 2012-12-19 MED ORDER — ACETAMINOPHEN 325 MG PO TABS: 650.0000 mg | ORAL_TABLET | ORAL | Status: DC | PRN

## 2012-12-19 MED ORDER — HYDRALAZINE HCL 20 MG/ML IJ SOLN
10.0000 mg | INTRAMUSCULAR | Status: DC | PRN
  Administered 2012-12-20: 01:00:00 10 mg via INTRAVENOUS
  Filled 2012-12-19 (×2): qty 1

## 2012-12-19 MED ORDER — LEVOTHYROXINE SODIUM 175 MCG PO TABS
175.0000 ug | ORAL_TABLET | Freq: Every day | ORAL | Status: DC
  Administered 2012-12-20: 175 ug via ORAL
  Filled 2012-12-19 (×2): qty 1

## 2012-12-19 MED ORDER — SODIUM CHLORIDE 0.9 % IV SOLN: INTRAVENOUS | Status: AC

## 2012-12-19 MED ORDER — ISOSORBIDE DINITRATE 20 MG PO TABS
20.0000 mg | ORAL_TABLET | Freq: Three times a day (TID) | ORAL | Status: DC
  Administered 2012-12-19 – 2012-12-20 (×3): 20 mg via ORAL
  Filled 2012-12-19 (×5): qty 1

## 2012-12-19 MED ORDER — NITROGLYCERIN 0.4 MG SL SUBL: 0.4000 mg | SUBLINGUAL_TABLET | SUBLINGUAL | Status: DC | PRN

## 2012-12-19 MED ORDER — SIMVASTATIN 40 MG PO TABS
40.0000 mg | ORAL_TABLET | Freq: Every day | ORAL | Status: DC
  Administered 2012-12-19: 40 mg via ORAL
  Filled 2012-12-19 (×2): qty 1

## 2012-12-19 MED ORDER — LIDOCAINE HCL (PF) 1 % IJ SOLN
INTRAMUSCULAR | Status: AC
  Filled 2012-12-19: qty 30

## 2012-12-19 NOTE — Progress Notes (Signed)
Patient set up on CPAP in Auto Mode.  Patient tolerating well at this time.

## 2012-12-19 NOTE — ED Provider Notes (Addendum)
Medical screening examination/treatment/procedure(s) were conducted as a shared visit with non-physician practitioner(s) or resident and myself. I personally evaluated the patient during the encounter and agree with the findings and plan unless otherwise indicated. I have personally reviewed any xrays and/ or EKG's with the provider and I agree with interpretation.  Lightheaded after being sat up quickly at Dr Benay Spice office. Sxs improved since, still feels general weakness. Admits to hx of similar with standing quickly and he has not had po fluids today. No cp, new sob or acute ha. Pt states he always has mild sob and this is not new. No blood in stools. Pt is on blood thinner. RRR, lungs clear, dry mm, abd soft/ NT. Plan for fluids, cardiac eval and to call cardiology. Patient was cleared to go home from cardiac stand point however he still felt lightheaded and not at his baseline.  Clinically orthostatic sxs.  Non focal neuro exam - CNs intact, equal strength bilateral, sensation intact, coordination intact in ED.  Observation recommended.   Lightheaded/ Presyncope, CRF Enid Skeens, MD 12/19/12 1452  Enid Skeens, MD 12/19/12 [phone]

## 2012-12-19 NOTE — CV Procedure (Signed)
[name] [name] is a 79 y.o. [name]    [id number] LOCATION:  FACILITY: [facility]  PHYSICIAN: Nanetta Batty, M.D. 06/11/1933   DATE OF PROCEDURE:  12/19/2012  DATE OF DISCHARGE:     CARDIAC CATHETERIZATION     History obtained from chart review.The patient is 79 year old moderately overweight married Caucasian [name] father of 1 child (1 deceased), grandfather to 1 stepchild who is accompanied by his wife and daughter today. 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 He had coronary artery bypass grafting in March 1998 with a LIMA to his LAD, a vein to the circumflex obtuse marginal branch, a vein to the right coronary artery. He also had left vertebral artery stenting by Dr. Antony Contras in [place] after his surgery in May 1998. His other problems include hypertension, hyperlipidemia, non-insulin-dependent diabetes and COPD. He has moderate renal insufficiency and was taken off ACE. He has obstructive sleep apnea on CPAP and is compliant. Cath performed May 29, 2006 revealed a patent LIMA to the LAD, 90% apical LAD lesion, total native circumflex and RCA with occluded vein to the OM branch, and a patent vein to the distal right. His vertebral stent was widely patent at that time. His EF was 55% with mild inferobasal hypokinesia April 2013. His last Myoview performed March 2014 revealed mild inferolateral ischemia explainable by his anatomy. It was a low risk study. Dr. Juanetta Gosling follows his lipid profile. He is in the office today complaining of exertional bilateral shoulder pain and SOB. He says when ever he walks to get the mail or takes the trash to the street he get SOB and has pain across his shoulders. He has to stop and rest. His symptoms seems to be increasing in severity and take longer to relieve with rest. A Myoview stress test was performed several days ago that showed inferolateral scar with moderate peri-infarct ischemia. The patient's story is compelling for angina/anginal equivalent. Based on this  am going to get a 2-D echocardiogram and proceed with outpatient diagnostic coronary angiography. His most recent serum creatinine was 1.6.    PROCEDURE DESCRIPTION:   The patient was brought to the second floor [facility] cath lab in the postabsorptive state. He was premedicated with Valium 5 mg by mouth. His right groinwas prepped and shaved in usual sterile fashion. Xylocaine 1% was used for local anesthesia. A 5 French sheath was inserted into the right common femoral artery using standard Seldinger technique.5 French right and left Judkins diagnostic catheters along with a ventricular catheter were used for selective coronary angiography, selective vein graft and IMA angiography and obtaining left heart pressures. Visipaque dye was used for the entirety of the case (50 cc). Retrograde aortic , left ventricular pullback pressures were recorded.  HEMODYNAMICS:    AO SYSTOLIC/AO DIASTOLIC: 180/76   LV SYSTOLIC/LV DIASTOLIC: 180/22  ANGIOGRAPHIC RESULTS:   1. Left main; 80% proximal  2. LAD; occluded at the junction of the proximal and mid third just after the takeoff of a small diagonal branch. 3. Left circumflex;  Nondominant and occluded proximally 4. Right coronary artery; dominant and occluded proximally 5.LIMA TO LAD; widely patent. Incidentally noted was a patent left vertebral artery stent with 50% in-stent restenosis" 6. SVG TO RCA was widely patent.     SVG TO circumflex obtuse marginal branch was occluded at the aortic ostium. It was not visualized today but this was an old finding. 7. Left ventriculography; not performed today because prior at [facility] showed normal LV function. Left  ventricular pressures were obtained.  IMPRESSION:[name] [name] has anatomy unchanged from his prior cath performed in 2008. His LIMA to his LAD is widely patent. His vein graft to the dominant right was widely patent with distal disease in the PLA branches, and vein graft to the circumflex was  occluded at the aorta. The circumflex system filled faintly by right-to-left collaterals. The LVEDP was 22. I think the abnormality in his LAD he was related to collateral insufficiency. Continued medical therapy recommended. The sheath was removed and pressure was held on the groin to achieve hemostasis. The patient left the Cath Lab in stable condition. He will be gently hydrated overnight, and discharged home in the morning.  [name] Gess MD, [facility] 12/19/2012 1:56 PM

## 2012-12-19 NOTE — H&P (View-Only) (Signed)
   12/13/2012 [name]   10/05/1933  [id number]  Primary Physician HAWKINS,EDWARD L, MD Primary Cardiologist: Jonathan J. Berry MD FACP,FACC,FAHA, FSCAI   HPI:  The patient is 78-year-old moderately overweight married Caucasian male father of 1 child (1 deceased), grandfather to 1 stepchild who is accompanied by his wife and daughter today. 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 He had coronary artery bypass grafting in March 1998 with a LIMA to his LAD, a vein to the circumflex obtuse marginal branch, a vein to the right coronary artery. He also had left vertebral artery stenting by Dr. Mark Wholey in [place] after his surgery in May 1998. His other problems include hypertension, hyperlipidemia, non-insulin-dependent diabetes and COPD. He has moderate renal insufficiency and was taken off ACE. He has obstructive sleep apnea on CPAP and is compliant. Cath performed May 29, 2006 revealed a patent LIMA to the LAD, 90% apical LAD lesion, total native circumflex and RCA with occluded vein to the OM branch, and a patent vein to the distal right. His vertebral stent was widely patent at that time. His EF was 55% with mild inferobasal hypokinesia April 2013. His last Myoview performed March 2014 revealed mild inferolateral ischemia explainable by his anatomy. It was a low risk study. Dr. Hawkins follows his lipid profile. He is in the office today complaining of exertional bilateral shoulder pain and SOB. He says when ever he walks to get the mail or takes the trash to the street he get SOB and has pain across his shoulders. He has to stop and rest. His symptoms seems to be increasing in severity and take longer to relieve with rest. A Myoview stress test was performed several days ago that showed inferolateral scar with moderate peri-infarct ischemia. The patient's story is compelling for angina/anginal equivalent. Based on this am going to get a 2-D echocardiogram and proceed with outpatient diagnostic  coronary angiography. His most recent serum creatinine was 1.6.    Current Outpatient Prescriptions  Medication Sig Dispense Refill  . allopurinol (ZYLOPRIM) 300 MG tablet Take 150 mg by mouth at bedtime.       . amLODipine (NORVASC) 5 MG tablet Take 1 tablet (5 mg total) by mouth daily.  180 tablet  3  . aspirin EC 81 MG tablet Take 81 mg by mouth every morning.       . Cholecalciferol (VITAMIN D3) 1000 UNITS CAPS Take 1 capsule by mouth 2 (two) times daily.       . clopidogrel (PLAVIX) 75 MG tablet TAKE ONE TABLET DAILY.  90 tablet  1  . gabapentin (NEURONTIN) 300 MG capsule Take 300 mg by mouth 4 (four) times daily as needed (takes three times daily but may take one in between if needed).      . glipiZIDE (GLUCOTROL) 10 MG tablet Take 10 mg by mouth 2 (two) times daily.       . HYDROcodone-acetaminophen (NORCO) 10-325 MG per tablet Take 1 tablet by mouth 4 (four) times daily as needed for pain.      . insulin glargine (LANTUS) 100 UNIT/ML injection Inject 44 Units into the skin 2 (two) times daily.       . isosorbide dinitrate (ISORDIL) 20 MG tablet Take 1 tablet (20 mg total) by mouth 3 (three) times daily.  60 tablet  6  . metoprolol tartrate (LOPRESSOR) 25 MG tablet Take 0.5 tablets (12.5 mg total) by mouth 2 (two) times daily.  90 tablet  1  .   nitroGLYCERIN (NITROSTAT) 0.4 MG SL tablet Place 1 tablet (0.4 mg total) under the tongue every 5 (five) minutes as needed for chest pain.  25 tablet  2  . omeprazole (PRILOSEC) 20 MG capsule Take 20 mg by mouth 2 (two) times daily.       . simvastatin (ZOCOR) 40 MG tablet Take 40 mg by mouth at bedtime.      . [name] (JANUVIA) 100 MG tablet Take 50 mg by mouth 2 (two) times daily.       . Tiotropium Bromide Monohydrate (SPIRIVA HANDIHALER IN) Inhale 1 capsule into the lungs every morning.       . triamcinolone cream (KENALOG) 0.1 %       . albuterol (PROVENTIL HFA;VENTOLIN HFA) 108 (90 BASE) MCG/ACT inhaler Inhale 2 puffs into the lungs  every 6 (six) hours as needed for wheezing or shortness of breath.  1 Inhaler  2  . albuterol (PROVENTIL) (5 MG/ML) 0.5% nebulizer solution Take 0.5 mLs (2.5 mg total) by nebulization 2 (two) times daily.  20 mL  3  . furosemide (LASIX) 20 MG tablet Take 20 mg by mouth 2 (two) times daily.      . levothyroxine (SYNTHROID, LEVOTHROID) 175 MCG tablet Take 1 tablet (175 mcg total) by mouth daily before breakfast.  30 tablet  3   No current facility-administered medications for this visit.    Allergies  Allergen Reactions  . Demerol Nausea And Vomiting    History   Social History  . Marital Status: Married    Spouse Name: N/A    Number of Children: N/A  . Years of Education: N/A   Occupational History  . retired    Social History Main Topics  . Smoking status: Former Smoker    Quit date: 12/09/1985  . Smokeless tobacco: Not on file  . Alcohol Use: No  . Drug Use: No  . Sexual Activity: Not on file   Other Topics Concern  . Not on file   Social History Narrative  . No narrative on file     Review of Systems: General: negative for chills, fever, night sweats or weight changes.  Cardiovascular: negative for chest pain, dyspnea on exertion, edema, orthopnea, palpitations, paroxysmal nocturnal dyspnea or shortness of breath Dermatological: negative for rash Respiratory: negative for cough or wheezing Urologic: negative for hematuria Abdominal: negative for nausea, vomiting, diarrhea, bright red blood per rectum, melena, or hematemesis Neurologic: negative for visual changes, syncope, or dizziness All other systems reviewed and are otherwise negative except as noted above.    Blood pressure 122/72, pulse 68, height 5' 11" (1.803 m), weight 278 lb (126.1 kg).  General appearance: alert and no distress Neck: no adenopathy, no carotid bruit, no JVD, supple, symmetrical, trachea midline and thyroid not enlarged, symmetric, no tenderness/mass/nodules Lungs: clear to  auscultation bilaterally Heart: regular rate and rhythm, S1, S2 normal, no murmur, click, rub or gallop Extremities: extremities normal, atraumatic, no cyanosis or edema  EKG not performed today  ASSESSMENT AND PLAN:   CAD, CABG '98, cath '08, low risk Myoview Feb 2012 It has been 6 years since Mr. Huesca is left cardiac catheterization which I performed/1/08. He had an occluded vein graft to circumflex obtuse marginal branch with occluded native circumflex. A patent vein to the dominant RCA and a patent LIMA to the LAD with normal LV function. He had mild infero-basal hypokinesia. Recent Myoview performed last week showed inferolateral scar with peri-infarct ischemia similar to prior functional studies. Patient gives   a compelling story of progressive dyspnea on exertion, neck and shoulder discomfort with exertion that resolves with rest. Of concern as is his anginal equivalent. He also complains of dizziness and has an atretic right vertebral artery and a stented left vertebral artery. Based on this I'm going to get a 2-D echo for LV function and proceed with cardiac catheterization as an outpatient at which time we will image his vertebral artery as well.      Jonathan J. Berry MD FACP,FACC,FAHA, FSCAI 12/13/2012 2:01 PM  

## 2012-12-19 NOTE — Interval H&P Note (Signed)
Cath Lab Visit (complete for each Cath Lab visit)  Clinical Evaluation Leading to the Procedure:   ACS: no  Non-ACS:    Anginal Classification: CCS III  Anti-ischemic medical therapy: Maximal Therapy (2 or more classes of medications)  Non-Invasive Test Results: Intermediate-risk stress test findings: cardiac mortality 1-3%/year  Prior CABG: Previous CABG      History and Physical Interval Note:  12/19/2012 1:15 PM  [name]  has presented today for surgery, with the diagnosis of Chest pain  The various methods of treatment have been discussed with the patient and family. After consideration of risks, benefits and other options for treatment, the patient has consented to  Procedure(s): LEFT HEART CATHETERIZATION WITH CORONARY ANGIOGRAM (N/A) as a surgical intervention .  The patient's history has been reviewed, patient examined, no change in status, stable for surgery.  I have reviewed the patient's chart and labs.  Questions were answered to the patient's satisfaction.     [name]

## 2012-12-20 ENCOUNTER — Encounter (HOSPITAL_COMMUNITY): Payer: Self-pay | Admitting: Cardiology

## 2012-12-20 DIAGNOSIS — I208 Other forms of angina pectoris: Secondary | ICD-10-CM | POA: Diagnosis present

## 2012-12-20 DIAGNOSIS — R001 Bradycardia, unspecified: Secondary | ICD-10-CM

## 2012-12-20 DIAGNOSIS — R0609 Other forms of dyspnea: Secondary | ICD-10-CM

## 2012-12-20 DIAGNOSIS — I2089 Other forms of angina pectoris: Secondary | ICD-10-CM | POA: Diagnosis present

## 2012-12-20 DIAGNOSIS — I2510 Atherosclerotic heart disease of native coronary artery without angina pectoris: Secondary | ICD-10-CM

## 2012-12-20 DIAGNOSIS — N183 Chronic kidney disease, stage 3 unspecified: Secondary | ICD-10-CM | POA: Diagnosis present

## 2012-12-20 DIAGNOSIS — I10 Essential (primary) hypertension: Secondary | ICD-10-CM

## 2012-12-20 DIAGNOSIS — I209 Angina pectoris, unspecified: Secondary | ICD-10-CM

## 2012-12-20 DIAGNOSIS — R5381 Other malaise: Secondary | ICD-10-CM

## 2012-12-20 HISTORY — DX: Bradycardia, unspecified: R00.1

## 2012-12-20 LAB — GLUCOSE, CAPILLARY
Glucose-Capillary: 111 mg/dL — ABNORMAL HIGH (ref 70–99)
Glucose-Capillary: 198 mg/dL — ABNORMAL HIGH (ref 70–99)

## 2012-12-20 MED ORDER — MUPIROCIN 2 % EX OINT
1.0000 "application " | TOPICAL_OINTMENT | Freq: Two times a day (BID) | CUTANEOUS | Status: DC
  Administered 2012-12-20: 1 via NASAL
  Filled 2012-12-20: qty 22

## 2012-12-20 MED ORDER — AMLODIPINE BESYLATE 10 MG PO TABS
10.0000 mg | ORAL_TABLET | Freq: Every day | ORAL | Status: DC
  Administered 2012-12-20: 10 mg via ORAL
  Filled 2012-12-20: qty 1

## 2012-12-20 MED ORDER — LIVING WELL WITH DIABETES BOOK
Freq: Once | Status: AC
  Administered 2012-12-20: 02:00:00
  Filled 2012-12-20: qty 1

## 2012-12-20 MED ORDER — FUROSEMIDE 20 MG PO TABS: 20.0000 mg | ORAL_TABLET | Freq: Two times a day (BID) | ORAL | Status: DC

## 2012-12-20 MED ORDER — CHLORHEXIDINE GLUCONATE CLOTH 2 % EX PADS
6.0000 | MEDICATED_PAD | Freq: Every day | CUTANEOUS | Status: DC
  Administered 2012-12-20: 06:00:00 6 via TOPICAL

## 2012-12-20 MED ORDER — LEVOTHYROXINE SODIUM 175 MCG PO TABS: 175.0000 ug | ORAL_TABLET | Freq: Every day | ORAL | Status: DC

## 2012-12-20 MED ORDER — AMLODIPINE BESYLATE 10 MG PO TABS: 10.0000 mg | ORAL_TABLET | Freq: Every day | ORAL | Status: DC

## 2012-12-20 NOTE — Progress Notes (Signed)
Late Entry for missed G-Code  12/18/12 0915  PT Time Calculation  PT Start Time 0840  PT Stop Time 0903  PT Time Calculation (min) 23 min  PT G-Codes **NOT FOR INPATIENT CLASS**  Functional Assessment Tool Used clinical judgement  Functional Limitation Mobility: Walking and moving around  Mobility: Walking and Moving Around Current Status [phone]) CI  Mobility: Walking and Moving Around Goal Status (W0981) CH  PT General Charges  $$ ACUTE PT VISIT 1 Procedure  PT Evaluation  $Initial PT Evaluation Tier I 1 Procedure  PT Treatments  $Gait Training 8-22 mins  Ralene Bathe Kistler PT 12/20/2012  [phone]

## 2012-12-20 NOTE — Progress Notes (Signed)
He had coronary artery bypass grafting in March 1998 with a LIMA to his LAD, a vein to the circumflex obtuse marginal branch, a vein to the right coronary artery. He also had left vertebral artery stenting by [name] in [place] after his surgery in May 1998. His other problems include hypertension, hyperlipidemia, non-insulin-dependent diabetes and COPD. He has moderate renal insufficiency and was taken off ACE. He has obstructive sleep apnea on CPAP and is compliant. Cath performed May 29, 2006 revealed a patent LIMA to the LAD, 90% apical LAD lesion, total native circumflex and RCA with occluded vein to the OM branch, and a patent vein to the distal right. His vertebral stent was widely patent at that time. His EF was 55% with mild inferobasal hypokinesia April 2013. His last Myoview performed March 2014 revealed mild inferolateral ischemia explainable by his anatomy. It was a low risk study. [name] follows his lipid profile. He is in the office today complaining of exertional bilateral shoulder pain and SOB. He says when ever he walks to get the mail or takes the trash to the street he get SOB and has pain across his shoulders. He has to stop and rest. His symptoms seems to be increasing in severity and take longer to relieve with rest. A Myoview stress test was performed several days ago that showed inferolateral scar with moderate peri-infarct ischemia. The patient's story is compelling for angina/anginal equivalent. Based on this am going to get a 2-D echocardiogram and proceed with outpatient diagnostic coronary angiography. His most recent serum creatinine was 1.6.    ECHO: 12/17/12 Study Conclusions  - Left ventricle: Wall thickness was increased in a pattern of moderate LVH. Systolic function was normal. The estimated ejection fraction was in the range of 55% to 60%. Wall motion was normal; there were no regional wall motion abnormalities. Features are consistent with  a pseudonormal left ventricular filling pattern, with concomitant abnormal relaxation and increased filling pressure (grade 2 diastolic dysfunction). - Mitral valve: Calcified annulus. - Left atrium: The atrium was moderately dilated   Subjective: No complaints ready for discharge.  Objective: Vital signs in last 24 hours: Temp:  [97.1 F (36.2 C)-98.4 F (36.9 C)] 98 F (36.7 C) (10/24 0700) Pulse Rate:  [58-90] 67 (10/24 0700) Resp:  [18-20] 18 (10/24 0700) BP: (136-207)/(27-102) 162/102 mmHg (10/24 0700) SpO2:  [95 %-100 %] 97 % (10/24 0700) Weight:  [270 lb (122.471 kg)-281 lb 8.4 oz (127.7 kg)] 281 lb 8.4 oz (127.7 kg) (10/24 0000) Weight change:    Intake/Output from previous day: -2060 10/23 0701 - 10/24 0700 In: 240 [P.O.:240] Out: 2300 [Urine:2300] Intake/Output this shift:    PE: General:Pleasant affect, NAD, hard of hearing Skin:Warm and dry, brisk capillary refill HEENT:normocephalic, sclera clear, mucus membranes moist Heart:S1S2 RRR with 1/6 systolic murmur,no gallup, rub or click Lungs:clear without rales, rhonchi, or wheezes ZOX:WRUEA, soft, non tender, + BS, do not palpate liver spleen or masses Ext:no lower ext edema, 2+ pedal pulses, 2+ radial pulses, rt groin without hematoma or bruising. Neuro:alert and oriented, MAE, follows commands, + facial symmetry     Lab Results:  Recent Labs  12/17/12 1140 12/18/12 0555  WBC 10.6* 10.1  HGB 13.0 11.9*  HCT 38.6* 36.4*  PLT 203 193   BMET  Recent Labs  12/17/12 1140 12/18/12 0555  NA 141 140  K 4.2 3.6  CL 102 103  CO2 30 27  GLUCOSE 103* 129*  BUN  22 21  CREATININE 1.87* 1.69*  CALCIUM 9.8 9.0    Recent Labs  12/17/12 2325 12/18/12 0555  TROPONINI <0.30 <0.30     Lab Results  Component Value Date   HGBA1C 7.5* 12/17/2012     Lab Results  Component Value Date   TSH 2.714 12/17/2012      Studies/Results: Cardiac cath ANGIOGRAPHIC RESULTS:  1. Left main; 80% proximal    2. LAD; occluded at the junction of the proximal and mid third just after the takeoff of a small diagonal branch.  3. Left circumflex; Nondominant and occluded proximally  4. Right coronary artery; dominant and occluded proximally  5.LIMA TO LAD; widely patent. Incidentally noted was a patent left vertebral artery stent with 50% in-stent restenosis"  6. SVG TO RCA was widely patent.  SVG TO circumflex obtuse marginal branch was occluded at the aortic ostium. It was not visualized today but this was an old finding.  7. Left ventriculography; not performed today because prior at echo showed normal LV function. Left ventricular pressures were obtained.  IMPRESSION:[name] has anatomy unchanged from his prior cath performed in 2008. His LIMA to his LAD is widely patent. His vein graft to the dominant right was widely patent with distal disease in the PLA branches, and vein graft to the circumflex was occluded at the aorta. The circumflex system filled faintly by right-to-left collaterals. The LVEDP was 22. I think the abnormality in his LAD he was related to collateral insufficiency. Continued medical therapy recommended. The sheath was removed and pressure was held on the groin to achieve hemostasis.   Medications: I have reviewed the patient's current medications. Scheduled Meds: . albuterol  2.5 mg Nebulization BID  . allopurinol  300 mg Oral QHS  . amLODipine  10 mg Oral Daily  . aspirin EC  81 mg Oral q morning - 10a  . Chlorhexidine Gluconate Cloth  6 each Topical Q0600  . clopidogrel  75 mg Oral Q breakfast  . furosemide  20 mg Oral BID  . glipiZIDE  10 mg Oral BID WC  . insulin glargine  44 Units Subcutaneous BID AC & HS  . isosorbide dinitrate  20 mg Oral TID  . levothyroxine  175 mcg Oral QAC breakfast  . linagliptin  5 mg Oral Daily  . metoprolol tartrate  12.5 mg Oral BID  . mupirocin ointment  1 application Nasal BID  . pantoprazole  40 mg Oral Daily  . simvastatin  40 mg Oral  QHS  . tiotropium  18 mcg Inhalation Daily  . triamcinolone cream   Topical BID   Continuous Infusions:  PRN Meds:.acetaminophen, albuterol, gabapentin, hydrALAZINE, HYDROcodone-acetaminophen, morphine injection, nitroGLYCERIN, ondansetron (ZOFRAN) IV  Assessment/Plan: Principal Problem:   Angina effort, wtih positive stress test Active Problems:   CAD, CABG '98, cath '08, low risk Myoview Feb 2012, stable cath 12/19/12 with chronically occluded VG to LCX, patent LIMA-LAD & VG to dominant rt.    Diastolic CHF, acute on chronic:  Grade one. EF 60-65%   DM (diabetes mellitus)   Hypothyroidism   HTN (hypertension)   COPD (chronic obstructive pulmonary disease)   Sleep apnea, on C-pap   CKD (chronic kidney disease) stage 3, GFR 30-59 ml/min   Bradycardia, to 30  PLAN: BP elevated, adjust meds.  Glucose stable.  HR during the night to 30, recently added lopressor 12.5 BID.  We did wear cpap last pm. HTN poorly controlled rec'd IV hydralazine during the night.  Will increase Norvasc  to 10 mg.  ? Stop lopressor-and may need outpt monitor.   On Isordil would change to IMDUR at 60 mg.- pt agreeable once his isordil runs out. Pt with CKD-3 he is diabetic and not on ACE or ARB. ? Due to ?  MD to see and plan to discharge home.  LOS: 1 day    [name]  Nurse Practitioner Certified Pager [phone] 12/20/2012, 9:50 AM

## 2012-12-20 NOTE — Progress Notes (Signed)
Pt. Seen and examined. Agree with the NP/PA-C note as written.  Doing well today. Groin is without hematoma or bruit. BP is high and HR low overnight. Hold b-blocker, increase amlodipine and imdur as suggested by Nada Boozer, NP. Okay for d/c home today. Follow-up with [name] in 1-2 weeks.  Chrystie Nose, MD, [facility] Attending Cardiologist [facility]

## 2012-12-20 NOTE — Discharge Summary (Signed)
Physician Discharge Summary       Patient ID: [name] MRN: [id number] DOB/AGE: 06-02-1933 79 y.o.  Admit date: 12/19/2012 Discharge date: 12/20/2012  Discharge Diagnoses:  Principal Problem:   Angina effort, wtih positive stress test Active Problems:   CAD, CABG '98, cath '08, low risk Myoview Feb 2012, stable cath 12/19/12 with chronically occluded VG to LCX, patent LIMA-LAD & VG to dominant rt.    Diastolic CHF, acute on chronic:  Grade one. EF 60-65%   DM (diabetes mellitus)   Hypothyroidism   HTN (hypertension)   COPD (chronic obstructive pulmonary disease)   Sleep apnea, on C-pap   CKD (chronic kidney disease) stage 3, GFR 30-59 ml/min   Bradycardia, to 30   Discharged Condition: good  Procedures: Cardiac cath 12/19/12 by Dr. Crista Curb Course: 79 year old WM coronary artery bypass grafting in March 1998 with a LIMA to his LAD, a vein to the circumflex obtuse marginal branch, a vein to the right coronary artery. He also had left vertebral artery stenting by Dr. Antony Contras in [place] after his surgery in May 1998. His other problems include hypertension, hyperlipidemia, non-insulin-dependent diabetes and COPD. He has moderate renal insufficiency and was taken off ACE. He has obstructive sleep apnea on CPAP and is compliant. Cath performed May 29, 2006 revealed a patent LIMA to the LAD, 90% apical LAD lesion, total native circumflex and RCA with occluded vein to the OM branch, and a patent vein to the distal right. His vertebral stent was widely patent at that time. His EF was 55% with mild inferobasal hypokinesia April 2013. His last Myoview performed March 2014 revealed mild inferolateral ischemia explainable by his anatomy. It was a low risk study. Dr. Juanetta Gosling follows his lipid profile. He was seen by [name] complaining of exertional bilateral shoulder pain and SOB. He stated when ever he walks to get the mail or takes the trash to the street he would become  SOB and had pain across his shoulders. He had to stop and rest. His symptoms seemed to be increasing in severity and take longer to relieve with rest. A Myoview stress test was performed several days ago that showed inferolateral scar with moderate peri-infarct ischemia. The patient's story is compelling for angina/anginal equivalent. Based on this, 2-D echocardiogram was done-EF 55-60% and grade 2 diastolic dysfunction and [name] proceeded with outpatient diagnostic coronary angiography. His most recent serum creatinine was 1.6.    Lopressor had been added to his medical regimen.  Cardiac cath completed without complications.  ANGIOGRAPHIC RESULTS:  1. Left main; 80% proximal  2. LAD; occluded at the junction of the proximal and mid third just after the takeoff of a small diagonal branch.  3. Left circumflex; Nondominant and occluded proximally  4. Right coronary artery; dominant and occluded proximally  5.LIMA TO LAD; widely patent. Incidentally noted was a patent left vertebral artery stent with 50% in-stent restenosis"  6. SVG TO RCA was widely patent.  SVG TO circumflex obtuse marginal branch was occluded at the aortic ostium. It was not visualized today but this was an old finding.  7. Left ventriculography; not performed today because prior at echo showed normal LV function. Left ventricular pressures were obtained.  IMPRESSION:[name] has anatomy unchanged from his prior cath performed in 2008. His LIMA to his LAD is widely patent. His vein graft to the dominant right was widely patent with distal disease in the PLA branches, and vein graft to the circumflex  was occluded at the aorta. The circumflex system filled faintly by right-to-left collaterals. The LVEDP was 22. I think the abnormality in his LAD he was related to collateral insufficiency. Continued medical therapy recommended. The sheath was removed and pressure was held on the groin to achieve hemostasis.   Post procedure he did  have bradycardia with HR to 30 at times, even with his Cpap in place.  Lopressor stopped.  His BP was elevated as well, with doses of IV hydralazine given for control.  Norvasc increased to 10 mg.  We will also change isordil to Imdur when his prescription of isordil runs out at 60 mg.  With his isordil he forgets some doses.  He was seen and found stable by Dr. Rennis Golden to be discharged home.        Consults: None  Significant Diagnostic Studies:  BMET    Component Value Date/Time   NA 140 12/18/2012 0555   K 3.6 12/18/2012 0555   CL 103 12/18/2012 0555   CO2 27 12/18/2012 0555   GLUCOSE 129* 12/18/2012 0555   BUN 21 12/18/2012 0555   CREATININE 1.69* 12/18/2012 0555   CREATININE 1.60* 12/09/2012 1517   CALCIUM 9.0 12/18/2012 0555   GFRNONAA 38* 12/18/2012 0555   GFRAA 44* 12/18/2012 0555    CBC    Component Value Date/Time   WBC 10.1 12/18/2012 0555   RBC 3.73* 12/18/2012 0555   HGB 11.9* 12/18/2012 0555   HCT 36.4* 12/18/2012 0555   PLT 193 12/18/2012 0555   MCV 97.6 12/18/2012 0555   MCH 31.9 12/18/2012 0555   MCHC 32.7 12/18/2012 0555   RDW 14.4 12/18/2012 0555   LYMPHSABS 1.8 12/17/2012 1140   MONOABS 1.1* 12/17/2012 1140   EOSABS 0.3 12/17/2012 1140   BASOSABS 0.0 12/17/2012 1140       Discharge Exam: Blood pressure 162/102, pulse 67, temperature 98 F (36.7 C), temperature source Oral, resp. rate 18, height 5\' 11"  (1.803 m), weight 281 lb 8.4 oz (127.7 kg), SpO2 97.00%.  AM exam:   PE: General:Pleasant affect, NAD, hard of hearing  Skin:Warm and dry, brisk capillary refill  HEENT:normocephalic, sclera clear, mucus membranes moist  Heart:S1S2 RRR with 1/6 systolic murmur,no gallup, rub or click  Lungs:clear without rales, rhonchi, or wheezes  ZOX:WRUEA, soft, non tender, + BS, do not palpate liver spleen or masses  Ext:no lower ext edema, 2+ pedal pulses, 2+ radial pulses, rt groin without hematoma or bruising.  Neuro:alert and oriented, MAE, follows  commands, + facial symmetry  Disposition: 06-Home-Health Care Svc   Future Appointments Provider Department Dept Phone   01/06/2013 11:15 AM [name], MD [facility] [phone]   01/14/2013 2:15 PM Alvan Dame, DPM [facility] [phone]       Medication List    STOP taking these medications       metoprolol tartrate 25 MG tablet  Commonly known as:  LOPRESSOR      TAKE these medications       albuterol (5 MG/ML) 0.5% nebulizer solution  Commonly known as:  PROVENTIL  Take 0.5 mLs (2.5 mg total) by nebulization 2 (two) times daily.     albuterol 108 (90 BASE) MCG/ACT inhaler  Commonly known as:  PROVENTIL HFA;VENTOLIN HFA  Inhale 2 puffs into the lungs every 6 (six) hours as needed for wheezing or shortness of breath.     allopurinol 300 MG tablet  Commonly known as:  ZYLOPRIM  Take 300 mg by mouth  at bedtime.     amLODipine 10 MG tablet  Commonly known as:  NORVASC  Take 1 tablet (10 mg total) by mouth daily.     aspirin EC 81 MG tablet  Take 81 mg by mouth every morning.     clopidogrel 75 MG tablet  Commonly known as:  PLAVIX  TAKE ONE TABLET DAILY.     furosemide 20 MG tablet  Commonly known as:  LASIX  Take 1 tablet (20 mg total) by mouth 2 (two) times daily.     gabapentin 300 MG capsule  Commonly known as:  NEURONTIN  Take 300 mg by mouth 4 (four) times daily as needed (takes three times daily but may take one in between if needed).     glipiZIDE 10 MG tablet  Commonly known as:  GLUCOTROL  Take 10 mg by mouth 2 (two) times daily.     HYDROcodone-acetaminophen 10-325 MG per tablet  Commonly known as:  NORCO  Take 1 tablet by mouth 4 (four) times daily as needed for pain.     insulin glargine 100 UNIT/ML injection  Commonly known as:  LANTUS  Inject 44 Units into the skin 2 (two) times daily.     isosorbide dinitrate 20 MG tablet  Commonly known as:  ISORDIL  Take 20 mg by mouth 3 (three) times  daily.     levothyroxine 175 MCG tablet  Commonly known as:  SYNTHROID, LEVOTHROID  Take 1 tablet (175 mcg total) by mouth daily before breakfast.     nitroGLYCERIN 0.4 MG SL tablet  Commonly known as:  NITROSTAT  Place 1 tablet (0.4 mg total) under the tongue every 5 (five) minutes as needed for chest pain.     omeprazole 20 MG capsule  Commonly known as:  PRILOSEC  Take 20 mg by mouth 2 (two) times daily.     simvastatin 40 MG tablet  Commonly known as:  ZOCOR  Take 40 mg by mouth at bedtime.     [name] 100 MG tablet  Commonly known as:  JANUVIA  Take 50 mg by mouth 2 (two) times daily.     SPIRIVA HANDIHALER IN  Inhale 1 capsule into the lungs every morning.     triamcinolone cream 0.1 %  Commonly known as:  KENALOG     Vitamin D3 1000 UNITS Caps  Take 1 capsule by mouth 2 (two) times daily.           Follow-up Information   Follow up with [name], MD On 01/06/2013. (at 11:15 AM)    Specialty:  Cardiology   Contact information:   [address] [place] [address] [phone]        Discharge Instructions: Call [facility] if any bleeding, swelling or drainage at cath site.  May shower, no tub baths for 48 hours for groin sticks.  No driving for 2 days No lifting over 5 pounds for 3 days.  Call when your Isordil is about to run out and we will call in IMDUR 60 mg daily.  Heart healthy diabetic diet  Signed: BJYNWG,NFAOZ R Nurse Practitioner-Certified [facility]: [facility] 12/20/2012, 12:35 PM  Time spent on discharge :35 minutes.

## 2012-12-20 NOTE — Progress Notes (Signed)
Reviewed isolation precautions and infection prevention related to patients positive MRSA culture. Patient and family verbalize understanding and state they have had patient on precautions before. They prefer not to use gowns and gloves but agree to perform good hand washing or use of alcohol gel when entering and leaving the room.

## 2012-12-24 ENCOUNTER — Ambulatory Visit (HOSPITAL_COMMUNITY): Payer: Medicare Other

## 2013-01-06 ENCOUNTER — Ambulatory Visit (INDEPENDENT_AMBULATORY_CARE_PROVIDER_SITE_OTHER): Payer: Medicare Other | Admitting: Cardiovascular Disease

## 2013-01-06 ENCOUNTER — Encounter: Payer: Self-pay | Admitting: Cardiovascular Disease

## 2013-01-06 VITALS — BP 128/60 | HR 72 | Ht 71.0 in | Wt 282.0 lb

## 2013-01-06 DIAGNOSIS — E785 Hyperlipidemia, unspecified: Secondary | ICD-10-CM | POA: Insufficient documentation

## 2013-01-06 DIAGNOSIS — I10 Essential (primary) hypertension: Secondary | ICD-10-CM

## 2013-01-06 DIAGNOSIS — I2510 Atherosclerotic heart disease of native coronary artery without angina pectoris: Secondary | ICD-10-CM

## 2013-01-06 NOTE — Assessment & Plan Note (Signed)
Controlled on current medications 

## 2013-01-06 NOTE — Assessment & Plan Note (Signed)
On statin therapy followed by his PCP 

## 2013-01-06 NOTE — Assessment & Plan Note (Signed)
Mr. Donaway was recently admitted with unstable angina. He does have a history of coronary artery bypass grafting in 1998 as well as left vertebral artery stenting. He'll LIMA to his LAD, vein to the circumflex marginal branch and a vein to the RCA. A Myoview stress test done several days prior to his heart cath revealed inferolateral scar with moderate peri-infarct ischemia. Because of the compelling story I performed cardiac catheterization on him 12/19/12 revealing anatomy unchanged from his prior cath in 2008. Since being discharged home he denies chest pain or shortness of breath.

## 2013-01-06 NOTE — Progress Notes (Signed)
01/06/2013 [name]   01/20/1934  [id number]  Primary Physician [name] Maudlin, MD Primary Cardiologist: [name] Gess MD [name]   HPI:  The patient is 79 year old moderately overweight married Caucasian male father of 1 child (1 deceased), grandfather to 1 stepchild who is accompanied by his wife and daughter today. 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 He had coronary artery bypass grafting in March 1998 with a LIMA to his LAD, a vein to the circumflex obtuse marginal branch, a vein to the right coronary artery. He also had left vertebral artery stenting by Dr. Antony [name] in [place] after his surgery in May 1998. His other problems include hypertension, hyperlipidemia, non-insulin-dependent diabetes and COPD. He has moderate renal insufficiency and was taken off ACE. He has obstructive sleep apnea on CPAP and is compliant. Cath performed May 29, 2006 revealed a patent LIMA to the LAD, 90% apical LAD lesion, total native circumflex and RCA with occluded vein to the OM branch, and a patent vein to the distal right. His vertebral stent was widely patent at that time. His EF was 55% with mild inferobasal hypokinesia April 2013. His last Myoview performed March 2014 revealed mild inferolateral ischemia explainable by his anatomy. It was a low risk study. [name] follows his lipid profile. He is in the office today complaining of exertional bilateral shoulder pain and SOB. He says when ever he walks to get the mail or takes the trash to the street he get SOB and has pain across his shoulders. He has to stop and rest. His symptoms seems to be increasing in severity and take longer to relieve with rest. A Myoview stress test was performed several days ago that showed inferolateral scar with moderate peri-infarct ischemia. The patient's story was compelling for angina/anginal equivalent. Based on this I proceeded with cardiac catheterization 12/19/12 revealing anatomy  similar to his prior cath in 08 including a patent stent to his left vertebral artery. Since discharge he felt well without chest pain or shortness of breath.   Current Outpatient Prescriptions  Medication Sig Dispense Refill  . acarbose (PRECOSE) 100 MG tablet Take 100 mg by mouth 3 (three) times daily with meals.      Marland Kitchen allopurinol (ZYLOPRIM) 300 MG tablet Take 150 mg by mouth at bedtime.       Marland Kitchen amLODipine (NORVASC) 10 MG tablet Take 1 tablet (10 mg total) by mouth daily.  30 tablet  6  . aspirin EC 81 MG tablet Take 81 mg by mouth every morning.       . Cholecalciferol (VITAMIN D3) 1000 UNITS CAPS Take 1 capsule by mouth 2 (two) times daily.       . clopidogrel (PLAVIX) 75 MG tablet TAKE ONE TABLET DAILY.  90 tablet  1  . furosemide (LASIX) 20 MG tablet Take 20 mg by mouth. 1/2 to 1 tablet daily      . gabapentin (NEURONTIN) 300 MG capsule Take 300 mg by mouth 4 (four) times daily as needed (takes three times daily but may take one in between if needed).      Marland Kitchen glipiZIDE (GLUCOTROL) 10 MG tablet Take 10 mg by mouth 2 (two) times daily.       Marland Kitchen HYDROcodone-acetaminophen (NORCO) 10-325 MG per tablet Take 1 tablet by mouth 4 (four) times daily as needed for pain.      Marland Kitchen insulin glargine (LANTUS) 100 UNIT/ML injection Inject 44 Units into the skin 2 (two) times daily.       Marland Kitchen  isosorbide dinitrate (ISORDIL) 20 MG tablet Take 20 mg by mouth 3 (three) times daily.      Marland Kitchen levothyroxine (SYNTHROID, LEVOTHROID) 150 MCG tablet Take 150 mcg by mouth daily before breakfast.      . nitroGLYCERIN (NITROSTAT) 0.4 MG SL tablet Place 1 tablet (0.4 mg total) under the tongue every 5 (five) minutes as needed for chest pain.  25 tablet  2  . omeprazole (PRILOSEC) 20 MG capsule Take 20 mg by mouth daily.       . simvastatin (ZOCOR) 40 MG tablet Take 40 mg by mouth at bedtime.      . [name] (JANUVIA) 100 MG tablet Take 50 mg by mouth 2 (two) times daily.       . Tiotropium Bromide Monohydrate (SPIRIVA  HANDIHALER IN) Inhale 1 capsule into the lungs every morning.       . triamcinolone cream (KENALOG) 0.1 %       . albuterol (PROVENTIL HFA;VENTOLIN HFA) 108 (90 BASE) MCG/ACT inhaler Inhale 2 puffs into the lungs every 6 (six) hours as needed for wheezing or shortness of breath.  1 Inhaler  2  . albuterol (PROVENTIL) (5 MG/ML) 0.5% nebulizer solution Take 0.5 mLs (2.5 mg total) by nebulization 2 (two) times daily.  20 mL  3   No current facility-administered medications for this visit.    Allergies  Allergen Reactions  . Demerol Nausea And Vomiting    History   Social History  . Marital Status: Married    Spouse Name: N/A    Number of Children: N/A  . Years of Education: N/A   Occupational History  . retired    Social History Main Topics  . Smoking status: Former Smoker -- 3.50 packs/day for 40 years    Types: Cigarettes    Quit date: 12/09/1985  . Smokeless tobacco: Former Neurosurgeon    Types: Chew     Comment: 12/17/2012 "quit chewing tobacco in 1998"  . Alcohol Use: Yes     Comment: 12/17/2012 "1-2 beers/wk"  . Drug Use: No  . Sexual Activity: No   Other Topics Concern  . Not on file   Social History Narrative  . No narrative on file     Review of Systems: General: negative for chills, fever, night sweats or weight changes.  Cardiovascular: negative for chest pain, dyspnea on exertion, edema, orthopnea, palpitations, paroxysmal nocturnal dyspnea or shortness of breath Dermatological: negative for rash Respiratory: negative for cough or wheezing Urologic: negative for hematuria Abdominal: negative for nausea, vomiting, diarrhea, bright red blood per rectum, melena, or hematemesis Neurologic: negative for visual changes, syncope, or dizziness All other systems reviewed and are otherwise negative except as noted above.    Blood pressure 128/60, pulse 72, height 5\' 11"  (1.803 m), weight 282 lb (127.914 kg).  General appearance: alert and no distress Neck: no  adenopathy, no carotid bruit, no JVD, supple, symmetrical, trachea midline and thyroid not enlarged, symmetric, no tenderness/mass/nodules Lungs: clear to auscultation bilaterally Heart: regular rate and rhythm, S1, S2 normal, no murmur, click, rub or gallop Extremities: extremities normal, atraumatic, no cyanosis or edema  EKG not performed today  ASSESSMENT AND PLAN:   CAD, CABG '98, cath '08, low risk Myoview Feb 2012, stable cath 12/19/12 with chronically occluded VG to LCX, patent LIMA-LAD & VG to dominant rt.  Mr. Barletta was recently admitted with unstable angina. He does have a history of coronary artery bypass grafting in 1998 as well as left vertebral artery stenting. He'll  LIMA to his LAD, vein to the circumflex marginal branch and a vein to the RCA. A Myoview stress test done several days prior to his heart cath revealed inferolateral scar with moderate peri-infarct ischemia. Because of the compelling story I performed cardiac catheterization on him 12/19/12 revealing anatomy unchanged from his prior cath in 2008. Since being discharged home he denies chest pain or shortness of breath.  HTN (hypertension) Controlled on current medications  Hyperlipidemia On statin therapy followed by his PCP      [name] Gess MD [facility], [facility] 01/06/2013 12:22 PM

## 2013-01-06 NOTE — Patient Instructions (Signed)
Your physician wants you to follow-up with an extender in 6 months & with Dr Allyson Sabal in one year. You will receive a reminder letter in the mail two months in advance. If you don't receive a letter, please call our office to schedule the follow-up appointment.

## 2013-01-14 ENCOUNTER — Ambulatory Visit: Payer: Self-pay

## 2013-02-04 ENCOUNTER — Ambulatory Visit (INDEPENDENT_AMBULATORY_CARE_PROVIDER_SITE_OTHER): Payer: Medicare Other

## 2013-02-04 VITALS — BP 148/69 | HR 86 | Resp 12

## 2013-02-04 DIAGNOSIS — E1140 Type 2 diabetes mellitus with diabetic neuropathy, unspecified: Secondary | ICD-10-CM

## 2013-02-04 DIAGNOSIS — E1142 Type 2 diabetes mellitus with diabetic polyneuropathy: Secondary | ICD-10-CM

## 2013-02-04 DIAGNOSIS — I739 Peripheral vascular disease, unspecified: Secondary | ICD-10-CM

## 2013-02-04 DIAGNOSIS — L608 Other nail disorders: Secondary | ICD-10-CM

## 2013-02-04 DIAGNOSIS — E1149 Type 2 diabetes mellitus with other diabetic neurological complication: Secondary | ICD-10-CM

## 2013-02-04 NOTE — Progress Notes (Signed)
   Subjective:    Patient ID: [name], male    DOB: July 30, 1933, 79 y.o.   MRN: [id number]  HPI Comments: '' TOENAILS TRIM''     Review of Systems  Eyes: Positive for visual disturbance.  Respiratory: Positive for chest tightness, shortness of breath and wheezing.        Objective:   Physical Exam Vascular status is intact as follows DP pulses +2/4 bilateral PT pulse thready to absent bilateral Refill time 3 seconds all digits. The diminished skin texture and turgor noted bilateral absent hair growth bilateral. Neurologically epicritic and proprioceptive sensations decreased on Semmes Weinstein testing bilateral there is normal DTRs normal plantar response. Orthopedic exam unremarkable noncontributory. Patient has digital contractures wearing his knee diabetic shoes and insoles which fit and contour well to the foot. Nails thick brittle criptotic incurvated no other new findings no open wounds or ulcerations noted       Assessment & Plan:  Assessment this time his diabetes with peripheral neuropathy and complications also vascular compromise. At this time dystrophic friable criptotic nails debrided 1 through 5 bilateral. Return for palliative care and as-needed basis suggest a 3 month followup care next  Alvan Dame DPM

## 2013-02-04 NOTE — Patient Instructions (Signed)
Diabetes and Foot Care Diabetes may cause you to have problems because of poor blood supply (circulation) to your feet and legs. This may cause the skin on your feet to become thinner, break easier, and heal more slowly. Your skin may become dry, and the skin may peel and crack. You may also have nerve damage in your legs and feet causing decreased feeling in them. You may not notice minor injuries to your feet that could lead to infections or more serious problems. Taking care of your feet is one of the most important things you can do for yourself.  HOME CARE INSTRUCTIONS  Wear shoes at all times, even in the house. Do not go barefoot. Bare feet are easily injured.  Check your feet daily for blisters, cuts, and redness. If you cannot see the bottom of your feet, use a mirror or ask someone for help.  Wash your feet with warm water (do not use hot water) and mild soap. Then pat your feet and the areas between your toes until they are completely dry. Do not soak your feet as this can dry your skin.  Apply a moisturizing lotion or petroleum jelly (that does not contain alcohol and is unscented) to the skin on your feet and to dry, brittle toenails. Do not apply lotion between your toes.  Trim your toenails straight across. Do not dig under them or around the cuticle. File the edges of your nails with an emery board or nail file.  Do not cut corns or calluses or try to remove them with medicine.  Wear clean socks or stockings every day. Make sure they are not too tight. Do not wear knee-high stockings since they may decrease blood flow to your legs.  Wear shoes that fit properly and have enough cushioning. To break in new shoes, wear them for just a few hours a day. This prevents you from injuring your feet. Always look in your shoes before you put them on to be sure there are no objects inside.  Do not cross your legs. This may decrease the blood flow to your feet.  If you find a minor scrape,  cut, or break in the skin on your feet, keep it and the skin around it clean and dry. These areas may be cleansed with mild soap and water. Do not cleanse the area with peroxide, alcohol, or iodine.  When you remove an adhesive bandage, be sure not to damage the skin around it.  If you have a wound, look at it several times a day to make sure it is healing.  Do not use heating pads or hot water bottles. They may burn your skin. If you have lost feeling in your feet or legs, you may not know it is happening until it is too late.  Make sure your health care provider performs a complete foot exam at least annually or more often if you have foot problems. Report any cuts, sores, or bruises to your health care provider immediately. SEEK MEDICAL CARE IF:   You have an injury that is not healing.  You have cuts or breaks in the skin.  You have an ingrown nail.  You notice redness on your legs or feet.  You feel burning or tingling in your legs or feet.  You have pain or cramps in your legs and feet.  Your legs or feet are numb.  Your feet always feel cold. SEEK IMMEDIATE MEDICAL CARE IF:   There is increasing redness,   swelling, or pain in or around a wound.  There is a red line that goes up your leg.  Pus is coming from a wound.  You develop a fever or as directed by your health care provider.  You notice a bad smell coming from an ulcer or wound. Document Released: 02/11/2000 Document Revised: 10/16/2012 Document Reviewed: 07/23/2012 Exit[employer] Patient Information 2014 [employer].  

## 2013-02-12 ENCOUNTER — Other Ambulatory Visit: Payer: Self-pay | Admitting: Cardiovascular Disease

## 2013-02-12 NOTE — Telephone Encounter (Signed)
Rx was sent to pharmacy electronically. 

## 2013-03-08 ENCOUNTER — Other Ambulatory Visit: Payer: Self-pay | Admitting: Cardiovascular Disease

## 2013-03-10 NOTE — Telephone Encounter (Signed)
Rx was sent to pharmacy electronically. 

## 2013-03-31 ENCOUNTER — Telehealth (HOSPITAL_COMMUNITY): Payer: Self-pay | Admitting: *Deleted

## 2013-04-03 ENCOUNTER — Ambulatory Visit (INDEPENDENT_AMBULATORY_CARE_PROVIDER_SITE_OTHER): Payer: Medicare Other | Admitting: Otolaryngology

## 2013-04-03 DIAGNOSIS — H903 Sensorineural hearing loss, bilateral: Secondary | ICD-10-CM

## 2013-04-07 ENCOUNTER — Ambulatory Visit (INDEPENDENT_AMBULATORY_CARE_PROVIDER_SITE_OTHER): Payer: Medicare Other | Admitting: Physician Assistant

## 2013-04-07 ENCOUNTER — Encounter: Payer: Self-pay | Admitting: Physician Assistant

## 2013-04-07 VITALS — BP 132/60 | HR 70 | Ht 71.0 in | Wt 281.5 lb

## 2013-04-07 DIAGNOSIS — I2510 Atherosclerotic heart disease of native coronary artery without angina pectoris: Secondary | ICD-10-CM

## 2013-04-07 DIAGNOSIS — R0600 Dyspnea, unspecified: Secondary | ICD-10-CM

## 2013-04-07 DIAGNOSIS — I5032 Chronic diastolic (congestive) heart failure: Secondary | ICD-10-CM

## 2013-04-07 DIAGNOSIS — I10 Essential (primary) hypertension: Secondary | ICD-10-CM

## 2013-04-07 DIAGNOSIS — I739 Peripheral vascular disease, unspecified: Secondary | ICD-10-CM

## 2013-04-07 DIAGNOSIS — R0609 Other forms of dyspnea: Secondary | ICD-10-CM

## 2013-04-07 DIAGNOSIS — E669 Obesity, unspecified: Secondary | ICD-10-CM

## 2013-04-07 DIAGNOSIS — E119 Type 2 diabetes mellitus without complications: Secondary | ICD-10-CM

## 2013-04-07 DIAGNOSIS — R51 Headache: Secondary | ICD-10-CM

## 2013-04-07 DIAGNOSIS — R519 Headache, unspecified: Secondary | ICD-10-CM

## 2013-04-07 DIAGNOSIS — R0989 Other specified symptoms and signs involving the circulatory and respiratory systems: Secondary | ICD-10-CM

## 2013-04-07 NOTE — Assessment & Plan Note (Signed)
Patient's weight is stable. He does adjust his dose of Lasix based on his edema. Is not complaining of orthopnea or acute heart failure. He also reports that the swelling in his legs is usually improved greatly by morning indicating some degree of venous insufficiency.

## 2013-04-07 NOTE — Patient Instructions (Signed)
1.  Follow up with Dr. Gwenlyn Found in July. 2.  Continue Lasix as prescribed. 3.  I will arrange a nutrition consult.

## 2013-04-07 NOTE — Assessment & Plan Note (Addendum)
Dyspnea is likely related to deconditioning, COPD and obesity.

## 2013-04-07 NOTE — Assessment & Plan Note (Signed)
Patient will be referred for medical nutrition therapy.

## 2013-04-07 NOTE — Progress Notes (Signed)
Date:  04/07/2013   ID:  [name], DOB 1933/08/12, MRN [id number]  PCP:  Alonza Bogus, MD  Primary Cardiologist:  Gwenlyn Found    History of Present Illness: [name] is a 80 y.o. male The patient is 80 year old moderately overweight married Caucasian male father of 1 child (1 deceased), grandfather to 71 stepchild who is accompanied by his wife and daughter today. 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 He had coronary artery bypass grafting in March 1998 with a LIMA to his LAD, a vein to the circumflex obtuse marginal branch, a vein to the right coronary artery. He also had left vertebral artery stenting by Dr. Leitha Schuller in [place] after his surgery in May 1998. His other problems include hypertension, hyperlipidemia, non-insulin-dependent diabetes and COPD. He has moderate renal insufficiency and was taken off ACE. He has obstructive sleep apnea on CPAP and is compliant. Cath performed May 29, 2006 revealed a patent LIMA to the LAD, 90% apical LAD lesion, total native circumflex and RCA with occluded vein to the OM branch, and a patent vein to the distal right. His vertebral stent was widely patent at that time. His EF was 93%, rate to diastolic dysfunction with mild inferobasal hypokinesia April 2013.  Dr. Luan Pulling follows his lipid profile.  A Myoview stress test was performed Dec 09, 2012 that showed inferolateral scar with moderate peri-infarct ischemia. The patient's story was compelling for angina/anginal equivalent. Based on this Dr Gwenlyn Found proceeded with cardiac catheterization 12/19/12 revealing anatomy similar to his prior cath in 08 including a patent stent to his left vertebral artery. Since discharge he felt well without chest pain or shortness of breath.  Dr Gwenlyn Found did a catheterization on him 12/19/12 revealing anatomy unchanged from his prior cath in 2008.  He presents today for evaluation. Patient had to attend a funeral this past weekend and while walking up hill he became  short of breath. He reports that he has to rest a lot more after taking the trash out. He's had a knot on the left side of his head on the posterior side and occasionally has sharp pain that runs up through this area.  It has been ongoing for several months.  He is to do a followup at the [facility] this coming Thursday and he recently reschedule his primary care provider appointment.  He takes Lasix 10-20 mg twice daily depending on the amount of edema in his legs.  His weight is stable when compared to October of last year.    The patient currently denies nausea, vomiting, fever, chest pain, orthopnea, dizziness, PND, cough, congestion, abdominal pain, hematochezia, melena, claudication.  Wt Readings from Last 3 Encounters:  04/07/13 281 lb 8 oz (127.688 kg)  01/06/13 282 lb (127.914 kg)  12/20/12 281 lb 8.4 oz (127.7 kg)     Past Medical History  Diagnosis Date  . Diabetes mellitus type II   . Hypothyroidism   . Hypertension   . GERD (gastroesophageal reflux disease)   . Angina   . CHF (congestive heart failure)     diastolic  . Heart attack   . COPD (chronic obstructive pulmonary disease)   . Coronary artery disease     CABG '98, cath '08, low risk Nuc 3/14  . Obesity   . PVD (peripheral vascular disease)     Lt vertebral stent '98  . Diastolic dysfunction     grade 1 with an EF of 60-65% 4/13  . OSA on  CPAP   . High cholesterol   . Chronic bronchitis     "usually get it q yr" (12/17/2012)  . Exertional shortness of breath   . Arthritis     "knees, hips" (12/17/2012)  . Gout     "ankles, legs" (12/17/2012)  . Nephrolithiasis   . Pneumonia 05/2011  . CAD, CABG '98, cath '08, low risk Myoview Feb 2012, stable cath 12/19/12 with chronically occluded VG to LCX, patent LIMA-LAD & VG to dominant rt.  06/01/2011    Patent LIMA-LAD, oclluded SVG-CFX, patent SVG-RCA in 2008   . CKD (chronic kidney disease) stage 3, GFR 30-59 ml/min 12/20/2012  . Bradycardia, to 30 12/20/2012     Current Outpatient Prescriptions  Medication Sig Dispense Refill  . acarbose (PRECOSE) 100 MG tablet Take 100 mg by mouth 3 (three) times daily with meals.      Marland Kitchen allopurinol (ZYLOPRIM) 300 MG tablet Take 150 mg by mouth at bedtime.       Marland Kitchen amLODipine (NORVASC) 10 MG tablet Take 1 tablet (10 mg total) by mouth daily.  30 tablet  6  . aspirin EC 81 MG tablet Take 81 mg by mouth every morning.       . Cholecalciferol (VITAMIN D3) 1000 UNITS CAPS Take 1 capsule by mouth 2 (two) times daily.       . clopidogrel (PLAVIX) 75 MG tablet TAKE ONE TABLET DAILY.  90 tablet  1  . furosemide (LASIX) 20 MG tablet Take 10-20 mg by mouth 2 (two) times daily. 1/2 to 1 tablet daily      . gabapentin (NEURONTIN) 300 MG capsule Take 300 mg by mouth 4 (four) times daily as needed (takes three times daily but may take one in between if needed).      Marland Kitchen glipiZIDE (GLUCOTROL) 10 MG tablet Take 10 mg by mouth 2 (two) times daily.       Marland Kitchen HYDROcodone-acetaminophen (NORCO) 10-325 MG per tablet Take 1 tablet by mouth 4 (four) times daily as needed for pain.      Marland Kitchen insulin glargine (LANTUS) 100 UNIT/ML injection Inject 44 Units into the skin 2 (two) times daily.       . isosorbide dinitrate (ISORDIL) 20 MG tablet Take 1 tablet (20 mg total) by mouth 3 (three) times daily.  90 tablet  10  . levothyroxine (SYNTHROID, LEVOTHROID) 150 MCG tablet Take 150 mcg by mouth daily before breakfast.      . mometasone (ELOCON) 0.1 % ointment as directed.      . nitroGLYCERIN (NITROSTAT) 0.4 MG SL tablet Place 1 tablet (0.4 mg total) under the tongue every 5 (five) minutes as needed for chest pain.  25 tablet  2  . omeprazole (PRILOSEC) 20 MG capsule Take 20 mg by mouth daily.       . simvastatin (ZOCOR) 80 MG tablet TAKE (1/2) TABLET BY MOUTH DAILY.  45 tablet  3  . [name] (JANUVIA) 100 MG tablet Take 50 mg by mouth 2 (two) times daily.       . Tiotropium Bromide Monohydrate (SPIRIVA HANDIHALER IN) Inhale 1 capsule into the  lungs every morning.       . triamcinolone cream (KENALOG) 0.1 %        No current facility-administered medications for this visit.    Allergies:    Allergies  Allergen Reactions  . Demerol Nausea And Vomiting    Social History:  The patient  reports that he quit smoking about 27 years ago. His  smoking use included Cigarettes. He has a 140 pack-year smoking history. He has quit using smokeless tobacco. His smokeless tobacco use included Chew. He reports that he drinks alcohol. He reports that he does not use illicit drugs.   Family history:   Family History  Problem Relation Age of Onset  . Arthritis    . Cancer    . Diabetes      ROS:  Please see the history of present illness.  All other systems reviewed and negative.   PHYSICAL EXAM: VS:  BP 132/60  Pulse 70  Ht 5\' 11"  (1.803 m)  Wt 281 lb 8 oz (127.688 kg)  BMI 39.28 kg/m2 Obesity, well developed, in no acute distress HEENT: Pupils are equal round react to light accommodation extraocular movements are intact. Left occipital region is nontender appears mildly larger than right side there is no erythema ecchymosis no sign of trauma. Neck: no JVDNo cervical lymphadenopathy. Cardiac: Regular rate and rhythm without murmurs rubs or gallops. Lungs:  clear to auscultation bilaterally, no wheezing, rhonchi or rales Abd: soft, nontender, positive bowel sounds all quadrants Ext: 2+ lower extremity edema.  2+ radial and dorsalis pedis pulses. Skin: warm and dry Neuro:  Grossly normal  EKG:  Sinus rhythm with first degree AV block     ASSESSMENT AND PLAN:  Problem List Items Addressed This Visit   DM (diabetes mellitus) (Chronic)   HTN (hypertension) (Chronic)     Blood pressure controlled at this time on current therapy    Obesity (Chronic)     Patient will be referred for medical nutrition therapy.    PVD, Lt Vertebral artery PTA in '08, known moderate ICA disease (Chronic)   Chronic diastolic heart failure - Primary      Patient's weight is stable. He does adjust his dose of Lasix based on his edema. Is not complaining of orthopnea or acute heart failure. He also reports that the swelling in his legs is usually improved greatly by morning indicating some degree of venous insufficiency.      Dyspnea      Dyspnea is likely related to deconditioning, COPD and obesity.    Occipital pain     Patient is complaining of sharp intermittent occipital pain on the left side for several months. A scissors a knot on the back of his head he does not appear to be a lymph node and is nontender in the area. This may be related to cervical spine disease. He will follow up with his primary care to doctor soon.

## 2013-04-07 NOTE — Assessment & Plan Note (Signed)
Patient is complaining of sharp intermittent occipital pain on the left side for several months. A scissors a knot on the back of his head he does not appear to be a lymph node and is nontender in the area. This may be related to cervical spine disease. He will follow up with his primary care to doctor soon.

## 2013-04-07 NOTE — Assessment & Plan Note (Signed)
Blood pressure controlled at this time on current therapy

## 2013-04-21 ENCOUNTER — Telehealth (HOSPITAL_COMMUNITY): Payer: Self-pay | Admitting: *Deleted

## 2013-04-21 ENCOUNTER — Other Ambulatory Visit (HOSPITAL_COMMUNITY): Payer: Self-pay | Admitting: Cardiovascular Disease

## 2013-04-21 DIAGNOSIS — I6529 Occlusion and stenosis of unspecified carotid artery: Secondary | ICD-10-CM

## 2013-04-25 ENCOUNTER — Ambulatory Visit (HOSPITAL_COMMUNITY)
Admission: RE | Admit: 2013-04-25 | Discharge: 2013-04-25 | Disposition: A | Discharge status: Home / Self Care | Payer: Medicare Other | Source: Ambulatory Visit | Attending: Cardiovascular Disease | Admitting: Cardiovascular Disease

## 2013-04-25 DIAGNOSIS — I6529 Occlusion and stenosis of unspecified carotid artery: Secondary | ICD-10-CM | POA: Insufficient documentation

## 2013-04-25 NOTE — Progress Notes (Signed)
Carotid Duplex Completed. [name], BS, RDMS, RVT  

## 2013-04-28 ENCOUNTER — Other Ambulatory Visit (HOSPITAL_COMMUNITY): Payer: Self-pay | Admitting: Cardiovascular Disease

## 2013-04-28 NOTE — Telephone Encounter (Signed)
E sentrx 

## 2013-05-04 ENCOUNTER — Encounter: Payer: Self-pay | Admitting: *Deleted

## 2013-05-04 ENCOUNTER — Telehealth: Payer: Self-pay | Admitting: *Deleted

## 2013-05-04 DIAGNOSIS — I6529 Occlusion and stenosis of unspecified carotid artery: Secondary | ICD-10-CM

## 2013-05-04 NOTE — Telephone Encounter (Signed)
Order placed for repeat carotid dopplers in 1 year  

## 2013-05-04 NOTE — Telephone Encounter (Signed)
Message copied by Chauncy Lean on Sun May 04, 2013 10:39 PM ------      Message from: Lorretta Harp      Created: Sun May 04, 2013  5:39 PM       No change from prior study. Repeat in 12 months. ------

## 2013-05-06 ENCOUNTER — Ambulatory Visit (INDEPENDENT_AMBULATORY_CARE_PROVIDER_SITE_OTHER): Payer: Medicare Other

## 2013-05-06 VITALS — BP 125/68 | HR 65 | Resp 16

## 2013-05-06 DIAGNOSIS — Q828 Other specified congenital malformations of skin: Secondary | ICD-10-CM

## 2013-05-06 DIAGNOSIS — E1149 Type 2 diabetes mellitus with other diabetic neurological complication: Secondary | ICD-10-CM

## 2013-05-06 DIAGNOSIS — E1140 Type 2 diabetes mellitus with diabetic neuropathy, unspecified: Secondary | ICD-10-CM

## 2013-05-06 DIAGNOSIS — L608 Other nail disorders: Secondary | ICD-10-CM

## 2013-05-06 DIAGNOSIS — I739 Peripheral vascular disease, unspecified: Secondary | ICD-10-CM

## 2013-05-06 NOTE — Progress Notes (Signed)
   Subjective:    Patient ID: [name], male    DOB: 06-23-1933, 80 y.o.   MRN: [id number]  HPI Comments: "Cut them toenails"     Review of Systems no new changes or find     Objective:   Physical Exam Neurovascular status is intact with pedal pulses palpable DP +2/4 PT thready absent bilateral Refill time 3 seconds all digits diminished skin texture and turgor bilateral there is absent hair growth diminished epicritic sensation Semmes Weinstein testing to the forefoot and digits there is no acute keratoses at this time some dry skin patient lotion today however the to be sub-fifth bilateral has keratoses pinch callus and not need any debridement at this time. Nails thick brittle criptotic incurvated and friable no other wounds ulcerations noted no secondary infection       Assessment & Plan:  Assessment this time his diabetes with peripheral neuropathy and cocking factors positive after compromise dystrophic from criptotic nails debrided 1 through 5 bilateral patient indicates that he did have some cracking the nails both great toes cotton socks at this time since left heel fissures nails noted one medial nail fold these are debrided back thoroughly no signs of infection no discharge or drainage noted return in 3 months for continued palliative care in the future as needed nails debrided x10  Harriet Masson DPM

## 2013-05-06 NOTE — Patient Instructions (Signed)
Diabetes and Foot Care Diabetes may cause you to have problems because of poor blood supply (circulation) to your feet and legs. This may cause the skin on your feet to become thinner, break easier, and heal more slowly. Your skin may become dry, and the skin may peel and crack. You may also have nerve damage in your legs and feet causing decreased feeling in them. You may not notice minor injuries to your feet that could lead to infections or more serious problems. Taking care of your feet is one of the most important things you can do for yourself.  HOME CARE INSTRUCTIONS  Wear shoes at all times, even in the house. Do not go barefoot. Bare feet are easily injured.  Check your feet daily for blisters, cuts, and redness. If you cannot see the bottom of your feet, use a mirror or ask someone for help.  Wash your feet with warm water (do not use hot water) and mild soap. Then pat your feet and the areas between your toes until they are completely dry. Do not soak your feet as this can dry your skin.  Apply a moisturizing lotion or petroleum jelly (that does not contain alcohol and is unscented) to the skin on your feet and to dry, brittle toenails. Do not apply lotion between your toes.  Trim your toenails straight across. Do not dig under them or around the cuticle. File the edges of your nails with an emery board or nail file.  Do not cut corns or calluses or try to remove them with medicine.  Wear clean socks or stockings every day. Make sure they are not too tight. Do not wear knee-high stockings since they may decrease blood flow to your legs.  Wear shoes that fit properly and have enough cushioning. To break in new shoes, wear them for just a few hours a day. This prevents you from injuring your feet. Always look in your shoes before you put them on to be sure there are no objects inside.  Do not cross your legs. This may decrease the blood flow to your feet.  If you find a minor scrape,  cut, or break in the skin on your feet, keep it and the skin around it clean and dry. These areas may be cleansed with mild soap and water. Do not cleanse the area with peroxide, alcohol, or iodine.  When you remove an adhesive bandage, be sure not to damage the skin around it.  If you have a wound, look at it several times a day to make sure it is healing.  Do not use heating pads or hot water bottles. They may burn your skin. If you have lost feeling in your feet or legs, you may not know it is happening until it is too late.  Make sure your health care provider performs a complete foot exam at least annually or more often if you have foot problems. Report any cuts, sores, or bruises to your health care provider immediately. SEEK MEDICAL CARE IF:   You have an injury that is not healing.  You have cuts or breaks in the skin.  You have an ingrown nail.  You notice redness on your legs or feet.  You feel burning or tingling in your legs or feet.  You have pain or cramps in your legs and feet.  Your legs or feet are numb.  Your feet always feel cold. SEEK IMMEDIATE MEDICAL CARE IF:   There is increasing redness,   swelling, or pain in or around a wound.  There is a red line that goes up your leg.  Pus is coming from a wound.  You develop a fever or as directed by your health care provider.  You notice a bad smell coming from an ulcer or wound. Document Released: 02/11/2000 Document Revised: 10/16/2012 Document Reviewed: 07/23/2012 Exit[employer] Patient Information 2014 [employer].  

## 2013-06-16 ENCOUNTER — Emergency Department (HOSPITAL_COMMUNITY): Payer: Medicare Other

## 2013-06-16 ENCOUNTER — Encounter (HOSPITAL_COMMUNITY): Payer: Self-pay | Admitting: Emergency Medicine

## 2013-06-16 ENCOUNTER — Emergency Department (HOSPITAL_COMMUNITY)
Admission: EM | Admit: 2013-06-16 | Discharge: 2013-06-16 | Disposition: A | Discharge status: Home / Self Care | Payer: Medicare Other | Attending: Emergency Medicine | Admitting: Emergency Medicine

## 2013-06-16 DIAGNOSIS — Z7982 Long term (current) use of aspirin: Secondary | ICD-10-CM | POA: Insufficient documentation

## 2013-06-16 DIAGNOSIS — Z951 Presence of aortocoronary bypass graft: Secondary | ICD-10-CM | POA: Insufficient documentation

## 2013-06-16 DIAGNOSIS — Z8781 Personal history of (healed) traumatic fracture: Secondary | ICD-10-CM | POA: Insufficient documentation

## 2013-06-16 DIAGNOSIS — N183 Chronic kidney disease, stage 3 unspecified: Secondary | ICD-10-CM | POA: Insufficient documentation

## 2013-06-16 DIAGNOSIS — I2510 Atherosclerotic heart disease of native coronary artery without angina pectoris: Secondary | ICD-10-CM | POA: Insufficient documentation

## 2013-06-16 DIAGNOSIS — IMO0002 Reserved for concepts with insufficient information to code with codable children: Secondary | ICD-10-CM | POA: Insufficient documentation

## 2013-06-16 DIAGNOSIS — Z9861 Coronary angioplasty status: Secondary | ICD-10-CM | POA: Insufficient documentation

## 2013-06-16 DIAGNOSIS — Z87891 Personal history of nicotine dependence: Secondary | ICD-10-CM | POA: Insufficient documentation

## 2013-06-16 DIAGNOSIS — Z9981 Dependence on supplemental oxygen: Secondary | ICD-10-CM | POA: Insufficient documentation

## 2013-06-16 DIAGNOSIS — Z794 Long term (current) use of insulin: Secondary | ICD-10-CM | POA: Insufficient documentation

## 2013-06-16 DIAGNOSIS — I129 Hypertensive chronic kidney disease with stage 1 through stage 4 chronic kidney disease, or unspecified chronic kidney disease: Secondary | ICD-10-CM | POA: Insufficient documentation

## 2013-06-16 DIAGNOSIS — Z96659 Presence of unspecified artificial knee joint: Secondary | ICD-10-CM | POA: Insufficient documentation

## 2013-06-16 DIAGNOSIS — M1710 Unilateral primary osteoarthritis, unspecified knee: Secondary | ICD-10-CM | POA: Insufficient documentation

## 2013-06-16 DIAGNOSIS — I5030 Unspecified diastolic (congestive) heart failure: Secondary | ICD-10-CM | POA: Insufficient documentation

## 2013-06-16 DIAGNOSIS — I209 Angina pectoris, unspecified: Secondary | ICD-10-CM | POA: Insufficient documentation

## 2013-06-16 DIAGNOSIS — J449 Chronic obstructive pulmonary disease, unspecified: Secondary | ICD-10-CM | POA: Insufficient documentation

## 2013-06-16 DIAGNOSIS — J4489 Other specified chronic obstructive pulmonary disease: Secondary | ICD-10-CM | POA: Insufficient documentation

## 2013-06-16 DIAGNOSIS — E785 Hyperlipidemia, unspecified: Secondary | ICD-10-CM | POA: Insufficient documentation

## 2013-06-16 DIAGNOSIS — I739 Peripheral vascular disease, unspecified: Secondary | ICD-10-CM | POA: Insufficient documentation

## 2013-06-16 DIAGNOSIS — Z8701 Personal history of pneumonia (recurrent): Secondary | ICD-10-CM | POA: Insufficient documentation

## 2013-06-16 DIAGNOSIS — E119 Type 2 diabetes mellitus without complications: Secondary | ICD-10-CM | POA: Insufficient documentation

## 2013-06-16 DIAGNOSIS — G4733 Obstructive sleep apnea (adult) (pediatric): Secondary | ICD-10-CM | POA: Insufficient documentation

## 2013-06-16 DIAGNOSIS — M1610 Unilateral primary osteoarthritis, unspecified hip: Secondary | ICD-10-CM | POA: Insufficient documentation

## 2013-06-16 DIAGNOSIS — Z79899 Other long term (current) drug therapy: Secondary | ICD-10-CM | POA: Insufficient documentation

## 2013-06-16 DIAGNOSIS — H4922 Sixth [abducent] nerve palsy, left eye: Secondary | ICD-10-CM

## 2013-06-16 DIAGNOSIS — Z9889 Other specified postprocedural states: Secondary | ICD-10-CM | POA: Insufficient documentation

## 2013-06-16 DIAGNOSIS — E669 Obesity, unspecified: Secondary | ICD-10-CM | POA: Insufficient documentation

## 2013-06-16 DIAGNOSIS — K219 Gastro-esophageal reflux disease without esophagitis: Secondary | ICD-10-CM | POA: Insufficient documentation

## 2013-06-16 DIAGNOSIS — Z87442 Personal history of urinary calculi: Secondary | ICD-10-CM | POA: Insufficient documentation

## 2013-06-16 DIAGNOSIS — H4920 Sixth [abducent] nerve palsy, unspecified eye: Secondary | ICD-10-CM | POA: Insufficient documentation

## 2013-06-16 DIAGNOSIS — M109 Gout, unspecified: Secondary | ICD-10-CM | POA: Insufficient documentation

## 2013-06-16 DIAGNOSIS — E039 Hypothyroidism, unspecified: Secondary | ICD-10-CM | POA: Insufficient documentation

## 2013-06-16 LAB — CBC WITH DIFFERENTIAL/PLATELET
Basophils Absolute: 0 10*3/uL (ref 0.0–0.1)
Basophils Relative: 0 % (ref 0–1)
Eosinophils Absolute: 0.3 10*3/uL (ref 0.0–0.7)
Eosinophils Relative: 3 % (ref 0–5)
HEMATOCRIT: 36.7 % — AB (ref 39.0–52.0)
HEMOGLOBIN: 11.9 g/dL — AB (ref 13.0–17.0)
Lymphocytes Relative: 18 % (ref 12–46)
Lymphs Abs: 1.9 10*3/uL (ref 0.7–4.0)
MCH: 32.2 pg (ref 26.0–34.0)
MCHC: 32.4 g/dL (ref 30.0–36.0)
MCV: 99.2 fL (ref 78.0–100.0)
MONO ABS: 1.2 10*3/uL — AB (ref 0.1–1.0)
MONOS PCT: 11 % (ref 3–12)
NEUTROS ABS: 7.2 10*3/uL (ref 1.7–7.7)
NEUTROS PCT: 68 % (ref 43–77)
Platelets: 236 10*3/uL (ref 150–400)
RBC: 3.7 MIL/uL — ABNORMAL LOW (ref 4.22–5.81)
RDW: 14.1 % (ref 11.5–15.5)
WBC: 10.6 10*3/uL — AB (ref 4.0–10.5)

## 2013-06-16 LAB — COMPREHENSIVE METABOLIC PANEL
ALK PHOS: 113 U/L (ref 39–117)
ALT: 19 U/L (ref 0–53)
AST: 30 U/L (ref 0–37)
Albumin: 3.6 g/dL (ref 3.5–5.2)
BUN: 30 mg/dL — AB (ref 6–23)
CHLORIDE: 98 meq/L (ref 96–112)
CO2: 27 meq/L (ref 19–32)
CREATININE: 2.1 mg/dL — AB (ref 0.50–1.35)
Calcium: 9.5 mg/dL (ref 8.4–10.5)
GFR calc Af Amer: 33 mL/min — ABNORMAL LOW (ref >90)
GFR, EST NON AFRICAN AMERICAN: 29 mL/min — AB (ref >90)
Glucose, Bld: 169 mg/dL — ABNORMAL HIGH (ref 70–99)
POTASSIUM: 4.5 meq/L (ref 3.7–5.3)
Sodium: 138 mEq/L (ref 137–147)
Total Bilirubin: 0.6 mg/dL (ref 0.3–1.2)
Total Protein: 7.7 g/dL (ref 6.0–8.3)

## 2013-06-16 LAB — APTT: aPTT: 33 seconds (ref 24–37)

## 2013-06-16 LAB — PROTIME-INR
INR: 1.02 (ref 0.00–1.49)
PROTHROMBIN TIME: 13.2 s (ref 11.6–15.2)

## 2013-06-16 LAB — TROPONIN I: Troponin I: <0.3 ng/mL (ref <0.30)

## 2013-06-16 LAB — T4: T4, Total: 9.1 ug/dL (ref 5.0–12.5)

## 2013-06-16 MED ORDER — LORAZEPAM 2 MG/ML IJ SOLN
2.0000 mg | Freq: Once | INTRAMUSCULAR | Status: AC
  Administered 2013-06-16: 2 mg via INTRAVENOUS
  Filled 2013-06-16: qty 1

## 2013-06-16 NOTE — ED Notes (Addendum)
Blurred, double vision, onset 11 am.  Rt eye deviated inward.   Pt was seen by his eye doctor and sent here for ct scan  Of head    Had vertebral artery surgery with stent placement.  Lt eye will not go to lt,
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
## 2013-06-16 NOTE — Discharge Instructions (Signed)
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 Your tests today do not show a stroke as the cause. You were discussed with a neurologist, Dr Merlene Laughter, and he recommended thyroid tests which were ordered and he will see you in the office in 2 weeks. The most likely etiology of this is your diabetes, and it should improve, but we will not be able to tell you how quickly it will improve. If the double vision bothers you a lot, you can wear a patch over your left eye.

## 2013-06-17 LAB — TSH: TSH: 1.34 u[IU]/mL (ref 0.350–4.500)

## 2013-07-04 ENCOUNTER — Other Ambulatory Visit (HOSPITAL_COMMUNITY): Payer: Self-pay | Admitting: Nephrology

## 2013-07-04 DIAGNOSIS — N289 Disorder of kidney and ureter, unspecified: Secondary | ICD-10-CM

## 2013-07-07 ENCOUNTER — Ambulatory Visit (HOSPITAL_COMMUNITY)
Admission: RE | Admit: 2013-07-07 | Discharge: 2013-07-07 | Disposition: A | Discharge status: Home / Self Care | Payer: Medicare Other | Source: Ambulatory Visit | Attending: Nephrology | Admitting: Nephrology

## 2013-07-07 ENCOUNTER — Other Ambulatory Visit (HOSPITAL_COMMUNITY): Payer: Medicare Other

## 2013-07-07 DIAGNOSIS — N281 Cyst of kidney, acquired: Secondary | ICD-10-CM | POA: Insufficient documentation

## 2013-07-07 DIAGNOSIS — N289 Disorder of kidney and ureter, unspecified: Secondary | ICD-10-CM | POA: Insufficient documentation

## 2013-07-12 ENCOUNTER — Other Ambulatory Visit: Payer: Self-pay | Admitting: Cardiology

## 2013-07-14 NOTE — Telephone Encounter (Signed)
Rx was sent to pharmacy electronically. 

## 2013-07-16 ENCOUNTER — Encounter (INDEPENDENT_AMBULATORY_CARE_PROVIDER_SITE_OTHER): Payer: Self-pay

## 2013-07-16 ENCOUNTER — Encounter: Payer: Self-pay | Admitting: Gastroenterology

## 2013-07-16 ENCOUNTER — Ambulatory Visit (INDEPENDENT_AMBULATORY_CARE_PROVIDER_SITE_OTHER): Payer: Medicare Other | Admitting: Gastroenterology

## 2013-07-16 ENCOUNTER — Encounter (HOSPITAL_COMMUNITY): Payer: Self-pay | Admitting: Pharmacy Technician

## 2013-07-16 VITALS — BP 143/63 | HR 72 | Temp 98.4°F | Ht 71.0 in | Wt 287.0 lb

## 2013-07-16 DIAGNOSIS — R198 Other specified symptoms and signs involving the digestive system and abdomen: Secondary | ICD-10-CM

## 2013-07-16 DIAGNOSIS — R194 Change in bowel habit: Secondary | ICD-10-CM

## 2013-07-16 DIAGNOSIS — R141 Gas pain: Secondary | ICD-10-CM

## 2013-07-16 DIAGNOSIS — R143 Flatulence: Secondary | ICD-10-CM

## 2013-07-16 DIAGNOSIS — I6529 Occlusion and stenosis of unspecified carotid artery: Secondary | ICD-10-CM

## 2013-07-16 DIAGNOSIS — R142 Eructation: Secondary | ICD-10-CM

## 2013-07-16 MED ORDER — PEG 3350-KCL-NA BICARB-NACL 420 G PO SOLR: 4000.0000 mL | ORAL | Status: DC

## 2013-07-16 MED ORDER — RESTORA PO CAPS: 1.0000 | ORAL_CAPSULE | Freq: Every day | ORAL | Status: DC

## 2013-07-16 NOTE — Patient Instructions (Signed)
1. Colonoscopy as scheduled. See separate instructions.  2. Start a probiotic once daily. We had only limited samples in office today. Start restora one daily. We you run out, you can buy Chamita over the counter.  3. Call if you have increased abdominal distention, nausea/vomiting, diarrhea, abdominal pain.

## 2013-07-16 NOTE — Progress Notes (Signed)
Primary Care Physician:  Alonza Bogus, MD  Primary Gastroenterologist:  Garfield Cornea, MD   Chief Complaint  Patient presents with  . Gas  . Diarrhea    HPI:  [name] is a 80 y.o. male here with complaints of difficulty with gas for past two weeks. When goes to BM, stools very mushy, difficult to clean self. Sometimes oozes stool and not aware of it. BM 5 times yesterday. Stools are not loose. Before would go few days inbetween stools. No melena, brbpr. Last antibiotics in 03/2013. No ill contacts, no travel abroad. No medication changes. No diet changes. No abdominal, no n/v, fever. Appetite good. No dysphagia.   Current Outpatient Prescriptions  Medication Sig Dispense Refill  . acarbose (PRECOSE) 100 MG tablet Take 100 mg by mouth 3 (three) times daily with meals.      Marland Kitchen allopurinol (ZYLOPRIM) 300 MG tablet Take 150 mg by mouth at bedtime.       Marland Kitchen amLODipine (NORVASC) 10 MG tablet TAKE 1 TABLET BY MOUTH ONCE A DAY.  30 tablet  9  . aspirin EC 81 MG tablet Take 81 mg by mouth every morning.       . Cholecalciferol (VITAMIN D3) 1000 UNITS CAPS Take 1 capsule by mouth 2 (two) times daily.       . clopidogrel (PLAVIX) 75 MG tablet Take 75 mg by mouth every morning.      . furosemide (LASIX) 20 MG tablet Take 20-40 mg by mouth 2 (two) times daily. Takes 40mg  in the morning and takes 20mg  at bedtime      . gabapentin (NEURONTIN) 300 MG capsule Take 300 mg by mouth 4 (four) times daily as needed (takes three times daily but may take one in between if needed).      Marland Kitchen glipiZIDE (GLUCOTROL) 10 MG tablet Take 10 mg by mouth 2 (two) times daily.       Marland Kitchen HYDROcodone-acetaminophen (NORCO) 10-325 MG per tablet Take 1 tablet by mouth 4 (four) times daily as needed for pain.      Marland Kitchen insulin glargine (LANTUS) 100 UNIT/ML injection Inject 60 Units into the skin 2 (two) times daily.       . isosorbide dinitrate (ISORDIL) 20 MG tablet Take 1 tablet (20 mg total) by mouth 3 (three) times daily.  90  tablet  10  . levothyroxine (SYNTHROID, LEVOTHROID) 150 MCG tablet Take 150 mcg by mouth daily before breakfast.      . mometasone (ELOCON) 0.1 % ointment Apply 1 application topically as directed.       . nitroGLYCERIN (NITROSTAT) 0.4 MG SL tablet Place 1 tablet (0.4 mg total) under the tongue every 5 (five) minutes as needed for chest pain.  25 tablet  2  . omeprazole (PRILOSEC) 20 MG capsule Take 20 mg by mouth at bedtime.       . simvastatin (ZOCOR) 80 MG tablet Take 40 mg by mouth at bedtime.      . [name] (JANUVIA) 100 MG tablet Take 50 mg by mouth 2 (two) times daily.       . Tiotropium Bromide Monohydrate (SPIRIVA HANDIHALER IN) Inhale 1 capsule into the lungs every morning.       . triamcinolone cream (KENALOG) 0.1 % Apply 1 application topically daily as needed (for irritation).        No current facility-administered medications for this visit.    Allergies as of 07/16/2013 - Review Complete 06/16/2013  Allergen Reaction Noted  . Demerol Nausea  And Vomiting 10/20/2010    Past Medical History  Diagnosis Date  . Diabetes mellitus type II   . Hypothyroidism   . Hypertension   . GERD (gastroesophageal reflux disease)   . Angina   . CHF (congestive heart failure)     diastolic  . Heart attack   . COPD (chronic obstructive pulmonary disease)   . Coronary artery disease     CABG '98, cath '08, low risk Nuc 3/14  . Obesity   . PVD (peripheral vascular disease)     Lt vertebral stent '98  . Diastolic dysfunction     grade 1 with an EF of 60-65% 4/13  . OSA on CPAP   . High cholesterol   . Chronic bronchitis     "usually get it q yr" (12/17/2012)  . Exertional shortness of breath   . Arthritis     "knees, hips" (12/17/2012)  . Gout     "ankles, legs" (12/17/2012)  . Pneumonia 05/2011  . CAD, CABG '98, cath '08, low risk Myoview Feb 2012, stable cath 12/19/12 with chronically occluded VG to LCX, patent LIMA-LAD & VG to dominant rt.  06/01/2011    Patent LIMA-LAD,  oclluded SVG-CFX, patent SVG-RCA in 2008   . Bradycardia, to 30 12/20/2012  . Nephrolithiasis   . CKD (chronic kidney disease) stage 3, GFR 30-59 ml/min 12/20/2012    Past Surgical History  Procedure Laterality Date  . Back surgery    . Replantation finger Right 2000    "little finger" (12/17/2012)  . Cholecystectomy    . Tonsillectomy  ?1960's  . Knee arthroscopy Left 1990's  . Lumbar laminectomy/decompression microdiscectomy  04/11/2011    Procedure: LUMBAR LAMINECTOMY/DECOMPRESSION MICRODISCECTOMY 1 LEVEL;  Surgeon: Peggyann Shoals, MD;  Location: [facility];  Service: Neurosurgery;  Laterality: Right;  RIGHT Lumbar Three-Four laminectomy with resection of synovial cyst  . Angioplasty  '99, 00, March 06  . Tibia fracture surgery Right ~ 2012  . Coronary artery bypass graft  04/1996    "CABG X6" (12/17/2012)  . Cataract extraction w/ intraocular lens  implant, bilateral    . Lithotripsy      "once" (12/17/2012)  . Cardiac catheterization  April 2006; 12/19/2012  . Coronary angioplasty with stent placement      "I've had a total of 3-4 stents put in" (12/17/2012)  . Fracture surgery    . Colonoscopy  08/09/2004    [id number] diverticula.  Remainder of colonic mucosa appeared normal/normal rectum  . Vertiberal artery stent      1998, 99% blocked.    Family History  Problem Relation Age of Onset  . Arthritis    . Cancer    . Diabetes    . Colon cancer Neg Hx     History   Social History  . Marital Status: Married    Spouse Name: N/A    Number of Children: N/A  . Years of Education: N/A   Occupational History  . retired    Social History Main Topics  . Smoking status: Former Smoker -- 3.50 packs/day for 40 years    Types: Cigarettes    Quit date: 12/09/1985  . Smokeless tobacco: Former Systems developer    Types: Chew     Comment: 12/17/2012 "quit chewing tobacco in 1998"  . Alcohol Use: Yes     Comment: 12/17/2012 "1-2 beers/wk" - socially   . Drug Use: No  . Sexual  Activity: No   Other Topics Concern  . Not on  file   Social History Narrative  . No narrative on file      ROS:  General: Negative for anorexia, weight loss, fever, chills, fatigue, weakness. Eyes: Negative for vision changes.  ENT: Negative for hoarseness, difficulty swallowing , nasal congestion. CV: Negative for chest pain, angina, palpitations, dyspnea on exertion, peripheral edema.  Respiratory: Negative for dyspnea at rest, dyspnea on exertion, cough, sputum, wheezing.  GI: See history of present illness. GU:  Negative for dysuria, hematuria, urinary incontinence, urinary frequency, nocturnal urination.  MS: Negative for joint pain, low back pain.  Derm: Negative for rash or itching.  Neuro: Negative for weakness, abnormal sensation, seizure, frequent headaches, memory loss, confusion.  Psych: Negative for anxiety, depression, suicidal ideation, hallucinations.  Endo: Negative for unusual weight change.  Heme: Negative for bruising or bleeding. Allergy: Negative for rash or hives.    Physical Examination:  BP 143/63  Pulse 72  Temp(Src) 98.4 F (36.9 C) (Oral)  Ht 5\' 11"  (1.803 m)  Wt 287 lb (130.182 kg)  BMI 40.05 kg/m2   General: Well-nourished, well-developed in no acute distress. Accompanied by wife. Head: Normocephalic, atraumatic.   Eyes: Conjunctiva pink, no icterus. Mouth: Oropharyngeal mucosa moist and pink , no lesions erythema or exudate. Neck: Supple without thyromegaly, masses, or lymphadenopathy.  Lungs: Clear to auscultation bilaterally.  Heart: Regular rate and rhythm, no murmurs rubs or gallops.  Abdomen: Bowel sounds are normal, obese,nontender, nondistended, no hepatosplenomegaly or masses, no abdominal bruits or    hernia , no rebound or guarding.   Rectal: not performed Extremities: No lower extremity edema. No clubbing or deformities.  Neuro: Alert and oriented x 4 , grossly normal neurologically.  Skin: Warm and dry, no rash or jaundice.    Psych: Alert and cooperative, normal mood and affect.  Labs: Lab Results  Component Value Date   CREATININE 2.10* 06/16/2013   BUN 30* 06/16/2013   NA 138 06/16/2013   K 4.5 06/16/2013   CL 98 06/16/2013   CO2 27 06/16/2013   Lab Results  Component Value Date   ALT 19 06/16/2013   AST 30 06/16/2013   ALKPHOS 113 06/16/2013   BILITOT 0.6 06/16/2013   Lab Results  Component Value Date   WBC 10.6* 06/16/2013   HGB 11.9* 06/16/2013   HCT 36.7* 06/16/2013   MCV 99.2 06/16/2013   PLT 236 06/16/2013     Imaging Studies: Ct Head Wo Contrast  06/16/2013   CLINICAL DATA:  80 year old male with blurred vision. History of vertebral artery surgery.  EXAM: CT HEAD WITHOUT CONTRAST  TECHNIQUE: Contiguous axial images were obtained from the base of the skull through the vertex without intravenous contrast.  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  Moderate chronic small-vessel white matter ischemic changes are present.  No acute intracranial abnormalities are identified, including mass lesion or mass effect, hydrocephalus, extra-axial fluid collection, midline shift, hemorrhage, or acute infarction.  The visualized bony calvarium is unremarkable.  Surgical clips within the left scalp are again noted.  IMPRESSION: No evidence of acute intracranial abnormality.  Remote infarcts as described and moderate chronic small-vessel white matter ischemic changes.   Electronically Signed   By: Hassan Rowan M.D.   On: 06/16/2013 15:18   Mr Brain Wo Contrast  06/16/2013   CLINICAL DATA:  Left 6 cranial nerve palsy.  EXAM: MRI HEAD WITHOUT CONTRAST  TECHNIQUE: Multiplanar, multiecho  pulse sequences of the brain and surrounding structures were obtained without intravenous contrast.  COMPARISON:  CT head 06/16/2013  FINDINGS: Generalized atrophy, typical for age.  Negative for hydrocephalus.   Extensive chronic ischemic changes are present. Chronic microvascular ischemia throughout the white matter. Chronic frontal lobe infarcts bilaterally. Chronic right parietal infarct. Chronic infarct in the right superior cerebellum and left mid cerebellum.  Negative for acute infarct.  Negative for hemorrhage or mass.  Paranasal sinuses are clear.  Image quality degraded by motion.  IMPRESSION: Atrophy and moderate to advanced chronic ischemia. No acute infarct or mass.   Electronically Signed   By: Franchot Gallo M.D.   On: 06/16/2013 17:19   US Renal  07/07/2013   CLINICAL DATA:  Renal insufficiency  EXAM: RENAL/URINARY TRACT ULTRASOUND COMPLETE  COMPARISON:  None.  FINDINGS: Right Kidney:  Length: 11.6 cm. Echogenicity within normal limits. No mass or hydronephrosis visualized. 1.8 x 1.3 x 1.4 cm cyst present within the upper-mid pole.  Left Kidney:  Length: 12.2 cm. Echogenicity within normal limits. No mass or hydronephrosis visualized. Two upper pole cysts were visualize, measuring 1.3 x 1.2 x 1.5 cm and 1.8 x 1.2 x 1.6 cm respectively.  Bladder:  Bladder was not well distended but grossly normal. Prevoid volume measured 66 cc. Bilateral ureteral jets were visualized.  IMPRESSION: 1. No sonographic evidence of hydronephrosis or other acute abnormality within the kidneys. Renal echogenicity within normal limits. 2. Bilateral simple renal cysts as above.   Electronically Signed   By: Jeannine Boga M.D.   On: 07/07/2013 16:11

## 2013-07-18 ENCOUNTER — Encounter: Payer: Self-pay | Admitting: Gastroenterology

## 2013-07-18 NOTE — Assessment & Plan Note (Addendum)
Two week h/o change in bowel habits, increased stool frequency/flatulence. However stools are not loose. Cdiff unlikely. No recent abx use. Last colonoscopy in 2006. Recommend colonoscopy in near future.  I have discussed the risks, alternatives, benefits with regards to but not limited to the risk of reaction to medication, bleeding, infection, perforation and the patient is agreeable to proceed. Written consent to be obtained.  Add probiotic.

## 2013-07-23 NOTE — Progress Notes (Signed)
cc'd to pcp 

## 2013-07-24 ENCOUNTER — Other Ambulatory Visit: Payer: Self-pay | Admitting: Internal Medicine

## 2013-07-31 ENCOUNTER — Encounter (HOSPITAL_COMMUNITY): Payer: Self-pay | Admitting: *Deleted

## 2013-07-31 ENCOUNTER — Encounter (HOSPITAL_COMMUNITY)
Admission: RE | Disposition: A | Discharge status: Home / Self Care | Payer: Self-pay | Source: Ambulatory Visit | Attending: Internal Medicine

## 2013-07-31 ENCOUNTER — Ambulatory Visit (HOSPITAL_COMMUNITY)
Admission: RE | Admit: 2013-07-31 | Discharge: 2013-07-31 | Disposition: A | Discharge status: Home / Self Care | Payer: Medicare Other | Source: Ambulatory Visit | Attending: Internal Medicine | Admitting: Internal Medicine

## 2013-07-31 DIAGNOSIS — J4489 Other specified chronic obstructive pulmonary disease: Secondary | ICD-10-CM | POA: Insufficient documentation

## 2013-07-31 DIAGNOSIS — E7800 Pure hypercholesterolemia, unspecified: Secondary | ICD-10-CM | POA: Insufficient documentation

## 2013-07-31 DIAGNOSIS — M109 Gout, unspecified: Secondary | ICD-10-CM | POA: Insufficient documentation

## 2013-07-31 DIAGNOSIS — I2510 Atherosclerotic heart disease of native coronary artery without angina pectoris: Secondary | ICD-10-CM | POA: Insufficient documentation

## 2013-07-31 DIAGNOSIS — Q438 Other specified congenital malformations of intestine: Secondary | ICD-10-CM | POA: Insufficient documentation

## 2013-07-31 DIAGNOSIS — Z951 Presence of aortocoronary bypass graft: Secondary | ICD-10-CM | POA: Insufficient documentation

## 2013-07-31 DIAGNOSIS — D126 Benign neoplasm of colon, unspecified: Secondary | ICD-10-CM

## 2013-07-31 DIAGNOSIS — R198 Other specified symptoms and signs involving the digestive system and abdomen: Secondary | ICD-10-CM

## 2013-07-31 DIAGNOSIS — G4733 Obstructive sleep apnea (adult) (pediatric): Secondary | ICD-10-CM | POA: Insufficient documentation

## 2013-07-31 DIAGNOSIS — E119 Type 2 diabetes mellitus without complications: Secondary | ICD-10-CM | POA: Insufficient documentation

## 2013-07-31 DIAGNOSIS — N183 Chronic kidney disease, stage 3 unspecified: Secondary | ICD-10-CM | POA: Insufficient documentation

## 2013-07-31 DIAGNOSIS — E039 Hypothyroidism, unspecified: Secondary | ICD-10-CM | POA: Insufficient documentation

## 2013-07-31 DIAGNOSIS — Z7982 Long term (current) use of aspirin: Secondary | ICD-10-CM | POA: Insufficient documentation

## 2013-07-31 DIAGNOSIS — Z79899 Other long term (current) drug therapy: Secondary | ICD-10-CM | POA: Insufficient documentation

## 2013-07-31 DIAGNOSIS — Z87891 Personal history of nicotine dependence: Secondary | ICD-10-CM | POA: Insufficient documentation

## 2013-07-31 DIAGNOSIS — I129 Hypertensive chronic kidney disease with stage 1 through stage 4 chronic kidney disease, or unspecified chronic kidney disease: Secondary | ICD-10-CM | POA: Insufficient documentation

## 2013-07-31 DIAGNOSIS — J449 Chronic obstructive pulmonary disease, unspecified: Secondary | ICD-10-CM | POA: Insufficient documentation

## 2013-07-31 DIAGNOSIS — R143 Flatulence: Secondary | ICD-10-CM

## 2013-07-31 DIAGNOSIS — R194 Change in bowel habit: Secondary | ICD-10-CM

## 2013-07-31 DIAGNOSIS — K5289 Other specified noninfective gastroenteritis and colitis: Secondary | ICD-10-CM | POA: Insufficient documentation

## 2013-07-31 HISTORY — PX: COLONOSCOPY: SHX5424

## 2013-07-31 LAB — GLUCOSE, CAPILLARY: Glucose-Capillary: 131 mg/dL — ABNORMAL HIGH (ref 70–99)

## 2013-07-31 SURGERY — COLONOSCOPY
Anesthesia: Moderate Sedation

## 2013-07-31 MED ORDER — FENTANYL CITRATE 0.05 MG/ML IJ SOLN
INTRAMUSCULAR | Status: AC
  Filled 2013-07-31: qty 4

## 2013-07-31 MED ORDER — FENTANYL CITRATE 0.05 MG/ML IJ SOLN
INTRAMUSCULAR | Status: DC | PRN
  Administered 2013-07-31: 50 ug via INTRAVENOUS

## 2013-07-31 MED ORDER — SODIUM CHLORIDE 0.9 % IV SOLN
INTRAVENOUS | Status: DC
  Administered 2013-07-31: 08:00:00 via INTRAVENOUS

## 2013-07-31 MED ORDER — ATROPINE SULFATE 1 MG/ML IJ SOLN
INTRAMUSCULAR | Status: DC | PRN
  Administered 2013-07-31: .5 mg via INTRAVENOUS

## 2013-07-31 MED ORDER — ONDANSETRON HCL 4 MG/2ML IJ SOLN
INTRAMUSCULAR | Status: DC | PRN
  Administered 2013-07-31: 4 mg via INTRAVENOUS

## 2013-07-31 MED ORDER — MIDAZOLAM HCL 5 MG/5ML IJ SOLN
INTRAMUSCULAR | Status: DC | PRN
  Administered 2013-07-31: 2 mg via INTRAVENOUS

## 2013-07-31 MED ORDER — MIDAZOLAM HCL 5 MG/5ML IJ SOLN
INTRAMUSCULAR | Status: DC
Start: 2013-07-31 — End: 2013-07-31
  Filled 2013-07-31: qty 10

## 2013-07-31 MED ORDER — ONDANSETRON HCL 4 MG/2ML IJ SOLN
INTRAMUSCULAR | Status: AC
  Filled 2013-07-31: qty 2

## 2013-07-31 MED ORDER — ATROPINE SULFATE 1 MG/ML IJ SOLN
INTRAMUSCULAR | Status: AC
  Filled 2013-07-31: qty 1

## 2013-07-31 MED ORDER — STERILE WATER FOR IRRIGATION IR SOLN
Status: DC | PRN
  Administered 2013-07-31: 08:00:00

## 2013-07-31 NOTE — Op Note (Addendum)
[facility] [address] [place], [address]   COLONOSCOPY PROCEDURE REPORT  PATIENT: [name], [name]  MR#:         [id number] BIRTHDATE: 29-Jan-1934 , 79  yrs. old GENDER: Male ENDOSCOPIST: R.  Garfield Cornea, MD FACP FACG REFERRED BY:  Sinda Du, M.D. PROCEDURE DATE:  07/31/2013 PROCEDURE:     Ileocolonoscopy with biopsy  INDICATIONS: Change in bowel habits  INFORMED CONSENT:  The risks, benefits, alternatives and imponderables including but not limited to bleeding, perforation as well as the possibility of a missed lesion have been reviewed.  The potential for biopsy, lesion removal, etc. have also been discussed.  Questions have been answered.  All parties agreeable. Please see the history and physical in the medical record for more information.  MEDICATIONS: Versed 2 mg IV and Fentanyl 50 mcg IV. Atropine 0.5 mg IV (given as prophylaxis before procedure due to history of bradycardia).  Zofran 4 mg IV  DESCRIPTION OF PROCEDURE:  After a digital rectal exam was performed, the EC-[id number] ([id number])  colonoscope was advanced from the anus through the rectum and colon to the area of the cecum, ileocecal valve and appendiceal orifice.  The cecum was deeply intubated.  These structures were well-seen and photographed for the record.  From the level of the cecum and ileocecal valve, the scope was slowly and cautiously withdrawn.  The mucosal surfaces were carefully surveyed utilizing scope tip deflection to facilitate fold flattening as needed.  The scope was pulled down into the rectum where a thorough examination including retroflexion was performed.    FINDINGS:  Adequate preparation. Normal rectum. Somewhat redundant colon; (1) diminutive polyp in the mid sigmoid segment; otherwise, the remainder of colonic mucosa appeared normal. Normal distal 10 cm of terminal ileal mucosa.  THERAPEUTIC / DIAGNOSTIC MANEUVERS PERFORMED:  The sigmoid polyp was cold  biopsied/removed. Segmental biopsies the ascending and descending segments were taken for histologic study  COMPLICATIONS: None  CECAL WITHDRAWAL TIME:  14 minutes  IMPRESSION:  Single colonic polyp-removed as described above. Status post segmental biopsy to evaluate for microscopic colitis  RECOMMENDATIONS: Followup on pathology.   _______________________________ eSigned:  R. Garfield Cornea, MD FACP [facility] 07/31/2013 10:16 AM Revised: 07/31/2013 10:16 AM  CC:

## 2013-07-31 NOTE — H&P (View-Only) (Signed)
Primary Care Physician:  Alonza Bogus, MD  Primary Gastroenterologist:  Garfield Cornea, MD   Chief Complaint  Patient presents with  . Gas  . Diarrhea    HPI:  [name] is a 80 y.o. male here with complaints of difficulty with gas for past two weeks. When goes to BM, stools very mushy, difficult to clean self. Sometimes oozes stool and not aware of it. BM 5 times yesterday. Stools are not loose. Before would go few days inbetween stools. No melena, brbpr. Last antibiotics in 03/2013. No ill contacts, no travel abroad. No medication changes. No diet changes. No abdominal, no n/v, fever. Appetite good. No dysphagia.   Current Outpatient Prescriptions  Medication Sig Dispense Refill  . acarbose (PRECOSE) 100 MG tablet Take 100 mg by mouth 3 (three) times daily with meals.      Marland Kitchen allopurinol (ZYLOPRIM) 300 MG tablet Take 150 mg by mouth at bedtime.       Marland Kitchen amLODipine (NORVASC) 10 MG tablet TAKE 1 TABLET BY MOUTH ONCE A DAY.  30 tablet  9  . aspirin EC 81 MG tablet Take 81 mg by mouth every morning.       . Cholecalciferol (VITAMIN D3) 1000 UNITS CAPS Take 1 capsule by mouth 2 (two) times daily.       . clopidogrel (PLAVIX) 75 MG tablet Take 75 mg by mouth every morning.      . furosemide (LASIX) 20 MG tablet Take 20-40 mg by mouth 2 (two) times daily. Takes 40mg  in the morning and takes 20mg  at bedtime      . gabapentin (NEURONTIN) 300 MG capsule Take 300 mg by mouth 4 (four) times daily as needed (takes three times daily but may take one in between if needed).      Marland Kitchen glipiZIDE (GLUCOTROL) 10 MG tablet Take 10 mg by mouth 2 (two) times daily.       Marland Kitchen HYDROcodone-acetaminophen (NORCO) 10-325 MG per tablet Take 1 tablet by mouth 4 (four) times daily as needed for pain.      Marland Kitchen insulin glargine (LANTUS) 100 UNIT/ML injection Inject 60 Units into the skin 2 (two) times daily.       . isosorbide dinitrate (ISORDIL) 20 MG tablet Take 1 tablet (20 mg total) by mouth 3 (three) times daily.  90  tablet  10  . levothyroxine (SYNTHROID, LEVOTHROID) 150 MCG tablet Take 150 mcg by mouth daily before breakfast.      . mometasone (ELOCON) 0.1 % ointment Apply 1 application topically as directed.       . nitroGLYCERIN (NITROSTAT) 0.4 MG SL tablet Place 1 tablet (0.4 mg total) under the tongue every 5 (five) minutes as needed for chest pain.  25 tablet  2  . omeprazole (PRILOSEC) 20 MG capsule Take 20 mg by mouth at bedtime.       . simvastatin (ZOCOR) 80 MG tablet Take 40 mg by mouth at bedtime.      . [name] (JANUVIA) 100 MG tablet Take 50 mg by mouth 2 (two) times daily.       . Tiotropium Bromide Monohydrate (SPIRIVA HANDIHALER IN) Inhale 1 capsule into the lungs every morning.       . triamcinolone cream (KENALOG) 0.1 % Apply 1 application topically daily as needed (for irritation).        No current facility-administered medications for this visit.    Allergies as of 07/16/2013 - Review Complete 06/16/2013  Allergen Reaction Noted  . Demerol Nausea  And Vomiting 10/20/2010    Past Medical History  Diagnosis Date  . Diabetes mellitus type II   . Hypothyroidism   . Hypertension   . GERD (gastroesophageal reflux disease)   . Angina   . CHF (congestive heart failure)     diastolic  . Heart attack   . COPD (chronic obstructive pulmonary disease)   . Coronary artery disease     CABG '98, cath '08, low risk Nuc 3/14  . Obesity   . PVD (peripheral vascular disease)     Lt vertebral stent '98  . Diastolic dysfunction     grade 1 with an EF of 60-65% 4/13  . OSA on CPAP   . High cholesterol   . Chronic bronchitis     "usually get it q yr" (12/17/2012)  . Exertional shortness of breath   . Arthritis     "knees, hips" (12/17/2012)  . Gout     "ankles, legs" (12/17/2012)  . Pneumonia 05/2011  . CAD, CABG '98, cath '08, low risk Myoview Feb 2012, stable cath 12/19/12 with chronically occluded VG to LCX, patent LIMA-LAD & VG to dominant rt.  06/01/2011    Patent LIMA-LAD,  oclluded SVG-CFX, patent SVG-RCA in 2008   . Bradycardia, to 30 12/20/2012  . Nephrolithiasis   . CKD (chronic kidney disease) stage 3, GFR 30-59 ml/min 12/20/2012    Past Surgical History  Procedure Laterality Date  . Back surgery    . Replantation finger Right 2000    "little finger" (12/17/2012)  . Cholecystectomy    . Tonsillectomy  ?1960's  . Knee arthroscopy Left 1990's  . Lumbar laminectomy/decompression microdiscectomy  04/11/2011    Procedure: LUMBAR LAMINECTOMY/DECOMPRESSION MICRODISCECTOMY 1 LEVEL;  Surgeon: Peggyann Shoals, MD;  Location: [facility];  Service: Neurosurgery;  Laterality: Right;  RIGHT Lumbar Three-Four laminectomy with resection of synovial cyst  . Angioplasty  '99, 00, March 06  . Tibia fracture surgery Right ~ 2012  . Coronary artery bypass graft  04/1996    "CABG X6" (12/17/2012)  . Cataract extraction w/ intraocular lens  implant, bilateral    . Lithotripsy      "once" (12/17/2012)  . Cardiac catheterization  April 2006; 12/19/2012  . Coronary angioplasty with stent placement      "I've had a total of 3-4 stents put in" (12/17/2012)  . Fracture surgery    . Colonoscopy  08/09/2004    [id number] diverticula.  Remainder of colonic mucosa appeared normal/normal rectum  . Vertiberal artery stent      1998, 99% blocked.    Family History  Problem Relation Age of Onset  . Arthritis    . Cancer    . Diabetes    . Colon cancer Neg Hx     History   Social History  . Marital Status: Married    Spouse Name: N/A    Number of Children: N/A  . Years of Education: N/A   Occupational History  . retired    Social History Main Topics  . Smoking status: Former Smoker -- 3.50 packs/day for 40 years    Types: Cigarettes    Quit date: 12/09/1985  . Smokeless tobacco: Former Systems developer    Types: Chew     Comment: 12/17/2012 "quit chewing tobacco in 1998"  . Alcohol Use: Yes     Comment: 12/17/2012 "1-2 beers/wk" - socially   . Drug Use: No  . Sexual  Activity: No   Other Topics Concern  . Not on  file   Social History Narrative  . No narrative on file      ROS:  General: Negative for anorexia, weight loss, fever, chills, fatigue, weakness. Eyes: Negative for vision changes.  ENT: Negative for hoarseness, difficulty swallowing , nasal congestion. CV: Negative for chest pain, angina, palpitations, dyspnea on exertion, peripheral edema.  Respiratory: Negative for dyspnea at rest, dyspnea on exertion, cough, sputum, wheezing.  GI: See history of present illness. GU:  Negative for dysuria, hematuria, urinary incontinence, urinary frequency, nocturnal urination.  MS: Negative for joint pain, low back pain.  Derm: Negative for rash or itching.  Neuro: Negative for weakness, abnormal sensation, seizure, frequent headaches, memory loss, confusion.  Psych: Negative for anxiety, depression, suicidal ideation, hallucinations.  Endo: Negative for unusual weight change.  Heme: Negative for bruising or bleeding. Allergy: Negative for rash or hives.    Physical Examination:  BP 143/63  Pulse 72  Temp(Src) 98.4 F (36.9 C) (Oral)  Ht 5\' 11"  (1.803 m)  Wt 287 lb (130.182 kg)  BMI 40.05 kg/m2   General: Well-nourished, well-developed in no acute distress. Accompanied by wife. Head: Normocephalic, atraumatic.   Eyes: Conjunctiva pink, no icterus. Mouth: Oropharyngeal mucosa moist and pink , no lesions erythema or exudate. Neck: Supple without thyromegaly, masses, or lymphadenopathy.  Lungs: Clear to auscultation bilaterally.  Heart: Regular rate and rhythm, no murmurs rubs or gallops.  Abdomen: Bowel sounds are normal, obese,nontender, nondistended, no hepatosplenomegaly or masses, no abdominal bruits or    hernia , no rebound or guarding.   Rectal: not performed Extremities: No lower extremity edema. No clubbing or deformities.  Neuro: Alert and oriented x 4 , grossly normal neurologically.  Skin: Warm and dry, no rash or jaundice.    Psych: Alert and cooperative, normal mood and affect.  Labs: Lab Results  Component Value Date   CREATININE 2.10* 06/16/2013   BUN 30* 06/16/2013   NA 138 06/16/2013   K 4.5 06/16/2013   CL 98 06/16/2013   CO2 27 06/16/2013   Lab Results  Component Value Date   ALT 19 06/16/2013   AST 30 06/16/2013   ALKPHOS 113 06/16/2013   BILITOT 0.6 06/16/2013   Lab Results  Component Value Date   WBC 10.6* 06/16/2013   HGB 11.9* 06/16/2013   HCT 36.7* 06/16/2013   MCV 99.2 06/16/2013   PLT 236 06/16/2013     Imaging Studies: Ct Head Wo Contrast  06/16/2013   CLINICAL DATA:  80 year old male with blurred vision. History of vertebral artery surgery.  EXAM: CT HEAD WITHOUT CONTRAST  TECHNIQUE: Contiguous axial images were obtained from the base of the skull through the vertex without intravenous contrast.  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  Moderate chronic small-vessel white matter ischemic changes are present.  No acute intracranial abnormalities are identified, including mass lesion or mass effect, hydrocephalus, extra-axial fluid collection, midline shift, hemorrhage, or acute infarction.  The visualized bony calvarium is unremarkable.  Surgical clips within the left scalp are again noted.  IMPRESSION: No evidence of acute intracranial abnormality.  Remote infarcts as described and moderate chronic small-vessel white matter ischemic changes.   Electronically Signed   By: Hassan Rowan M.D.   On: 06/16/2013 15:18   Mr Brain Wo Contrast  06/16/2013   CLINICAL DATA:  Left 6 cranial nerve palsy.  EXAM: MRI HEAD WITHOUT CONTRAST  TECHNIQUE: Multiplanar, multiecho  pulse sequences of the brain and surrounding structures were obtained without intravenous contrast.  COMPARISON:  CT head 06/16/2013  FINDINGS: Generalized atrophy, typical for age.  Negative for hydrocephalus.   Extensive chronic ischemic changes are present. Chronic microvascular ischemia throughout the white matter. Chronic frontal lobe infarcts bilaterally. Chronic right parietal infarct. Chronic infarct in the right superior cerebellum and left mid cerebellum.  Negative for acute infarct.  Negative for hemorrhage or mass.  Paranasal sinuses are clear.  Image quality degraded by motion.  IMPRESSION: Atrophy and moderate to advanced chronic ischemia. No acute infarct or mass.   Electronically Signed   By: Franchot Gallo M.D.   On: 06/16/2013 17:19   US Renal  07/07/2013   CLINICAL DATA:  Renal insufficiency  EXAM: RENAL/URINARY TRACT ULTRASOUND COMPLETE  COMPARISON:  None.  FINDINGS: Right Kidney:  Length: 11.6 cm. Echogenicity within normal limits. No mass or hydronephrosis visualized. 1.8 x 1.3 x 1.4 cm cyst present within the upper-mid pole.  Left Kidney:  Length: 12.2 cm. Echogenicity within normal limits. No mass or hydronephrosis visualized. Two upper pole cysts were visualize, measuring 1.3 x 1.2 x 1.5 cm and 1.8 x 1.2 x 1.6 cm respectively.  Bladder:  Bladder was not well distended but grossly normal. Prevoid volume measured 66 cc. Bilateral ureteral jets were visualized.  IMPRESSION: 1. No sonographic evidence of hydronephrosis or other acute abnormality within the kidneys. Renal echogenicity within normal limits. 2. Bilateral simple renal cysts as above.   Electronically Signed   By: Jeannine Boga M.D.   On: 07/07/2013 16:11

## 2013-07-31 NOTE — Discharge Instructions (Addendum)
°Colonoscopy °Discharge Instructions ° °Read the instructions outlined below and refer to this sheet in the next few weeks. These discharge instructions provide you with general information on caring for yourself after you leave the hospital. Your doctor may also give you specific instructions. While your treatment has been planned according to the most current medical practices available, unavoidable complications occasionally occur. If you have any problems or questions after discharge, call Dr. Rourk at [phone]. °ACTIVITY °· You may resume your regular activity, but move at a slower pace for the next 24 hours.  °· Take frequent rest periods for the next 24 hours.  °· Walking will help get rid of the air and reduce the bloated feeling in your belly (abdomen).  °· No driving for 24 hours (because of the medicine (anesthesia) used during the test).   °· Do not sign any important legal documents or operate any machinery for 24 hours (because of the anesthesia used during the test).  °NUTRITION °· Drink plenty of fluids.  °· You may resume your normal diet as instructed by your doctor.  °· Begin with a light meal and progress to your normal diet. Heavy or fried foods are harder to digest and may make you feel sick to your stomach (nauseated).  °· Avoid alcoholic beverages for 24 hours or as instructed.  °MEDICATIONS °· You may resume your normal medications unless your doctor tells you otherwise.  °WHAT YOU CAN EXPECT TODAY °· Some feelings of bloating in the abdomen.  °· Passage of more gas than usual.  °· Spotting of blood in your stool or on the toilet paper.  °IF YOU HAD POLYPS REMOVED DURING THE COLONOSCOPY: °· No aspirin products for 7 days or as instructed.  °· No alcohol for 7 days or as instructed.  °· Eat a soft diet for the next 24 hours.  °FINDING OUT THE RESULTS OF YOUR TEST °Not all test results are available during your visit. If your test results are not back during the visit, make an appointment  with your caregiver to find out the results. Do not assume everything is normal if you have not heard from your caregiver or the medical facility. It is important for you to follow up on all of your test results.  °SEEK IMMEDIATE MEDICAL ATTENTION IF: °· You have more than a spotting of blood in your stool.  °· Your belly is swollen (abdominal distention).  °· You are nauseated or vomiting.  °· You have a temperature over 101.  °· You have abdominal pain or discomfort that is severe or gets worse throughout the day.  ° ° °Polyp information provided ° °Further recommendations to follow pending review of pathology report ° °Colon Polyps °Polyps are lumps of extra tissue growing inside the body. Polyps can grow in the large intestine (colon). Most colon polyps are noncancerous (benign). However, some colon polyps can become cancerous over time. Polyps that are larger than a pea may be harmful. To be safe, caregivers remove and test all polyps. °CAUSES  °Polyps form when mutations in the genes cause your cells to grow and divide even though no more tissue is needed. °RISK FACTORS °There are a number of risk factors that can increase your chances of getting colon polyps. They include: °· Being older than 50 years. °· Family history of colon polyps or colon cancer. °· Long-term colon diseases, such as colitis or Crohn disease. °· Being overweight. °· Smoking. °· Being inactive. °· Drinking too much alcohol. °SYMPTOMS  °  Most small polyps do not cause symptoms. If symptoms are present, they may include: °· Blood in the stool. The stool may look dark red or black. °· Constipation or diarrhea that lasts longer than 1 week. °DIAGNOSIS °People often do not know they have polyps until their caregiver finds them during a regular checkup. Your caregiver can use 4 tests to check for polyps: °· Digital rectal exam. The caregiver wears gloves and feels inside the rectum. This test would find polyps only in the rectum. °· Barium  enema. The caregiver puts a liquid called barium into your rectum before taking X-rays of your colon. Barium makes your colon look white. Polyps are dark, so they are easy to see in the X-ray pictures. °· Sigmoidoscopy. A thin, flexible tube (sigmoidoscope) is placed into your rectum. The sigmoidoscope has a light and tiny camera in it. The caregiver uses the sigmoidoscope to look at the last third of your colon. °· Colonoscopy. This test is like sigmoidoscopy, but the caregiver looks at the entire colon. This is the most common method for finding and removing polyps. °TREATMENT  °Any polyps will be removed during a sigmoidoscopy or colonoscopy. The polyps are then tested for cancer. °PREVENTION  °To help lower your risk of getting more colon polyps: °· Eat plenty of fruits and vegetables. Avoid eating fatty foods. °· Do not smoke. °· Avoid drinking alcohol. °· Exercise every day. °· Lose weight if recommended by your caregiver. °· Eat plenty of calcium and folate. Foods that are rich in calcium include milk, cheese, and broccoli. Foods that are rich in folate include chickpeas, kidney beans, and spinach. °HOME CARE INSTRUCTIONS °Keep all follow-up appointments as directed by your caregiver. You may need periodic exams to check for polyps. °SEEK MEDICAL CARE IF: °You notice bleeding during a bowel movement. ° °

## 2013-07-31 NOTE — Interval H&P Note (Signed)
History and Physical Interval Note:  07/31/2013 8:10 AM  [name]  has presented today for surgery, with the diagnosis of CHANGE IN BOWELS AND FLATULENCE  The various methods of treatment have been discussed with the patient and family. After consideration of risks, benefits and other options for treatment, the patient has consented to  Procedure(s) with comments: COLONOSCOPY (N/A) - 12:15-rescheduled to [facility] notified pt as a surgical intervention .  The patient's history has been reviewed, patient examined, no change in status, stable for surgery.  I have reviewed the patient's chart and labs.  Questions were answered to the patient's satisfaction.     Cristopher Estimable Betha Shadix

## 2013-08-01 ENCOUNTER — Encounter (HOSPITAL_COMMUNITY): Payer: Self-pay | Admitting: Internal Medicine

## 2013-08-03 ENCOUNTER — Encounter: Payer: Self-pay | Admitting: Internal Medicine

## 2013-08-04 ENCOUNTER — Telehealth: Payer: Self-pay

## 2013-08-04 NOTE — Telephone Encounter (Signed)
Letter mailed to pt.  

## 2013-08-04 NOTE — Telephone Encounter (Signed)
Letter from: Daneil Dolin  Reason for Letter: Results Review  Send letter to patient.  Send copy of letter with path to referring provider and PCP.  Almyra FreeArloa Koh get him on [facility] or [facility]; would offer f/u appat 6-8 weeks

## 2013-08-05 ENCOUNTER — Encounter: Payer: Self-pay | Admitting: Internal Medicine

## 2013-08-05 NOTE — Telephone Encounter (Signed)
Results Cc to PCP  

## 2013-08-05 NOTE — Telephone Encounter (Signed)
Pt is aware of OV on 7/28 at 10am and appt card mailed

## 2013-08-06 ENCOUNTER — Telehealth: Payer: Self-pay | Admitting: Internal Medicine

## 2013-08-06 NOTE — Telephone Encounter (Signed)
Pt is aware.  

## 2013-08-06 NOTE — Telephone Encounter (Signed)
Pt LMOM that he had procedure by RMR and needs to speak with LSL or nurse. It was very important regarding his bowels. I transferred message to JL VM. Please call patient back at [phone]

## 2013-08-06 NOTE — Telephone Encounter (Signed)
Interesting.  He wasw having looser stools prior to TCS;  Bowel function will return; agree with Miralax as needed

## 2013-08-06 NOTE — Telephone Encounter (Signed)
I spoke with pt- he has not had a bm since last Thursday when he had his tcs. Pt stated he had a small bm today but it wasn't very much. He said it was normal for him to go 2-3 days without a bm but never this long. He is taking probiotic. He is not having any pain. He took 2 ducolax a couple of days ago with no results. Advised him that I would inform RMR but it would be tomorrow before I would be able to get back in touch with him. He said that was ok. Also advised him to increase his water intake because he said he doesn't drink very much and advised him that if he has any otc miralax that he should go ahead and take a dose tonight. Pt stated he would check and if he doesn't have any, he will get some and take it. He has taken it in the past with good results. Pt is not taking any fiber. Dr. Gala Romney, do you have any further recommendations?

## 2013-08-07 NOTE — Telephone Encounter (Signed)
Pt is aware.  

## 2013-08-19 ENCOUNTER — Ambulatory Visit (INDEPENDENT_AMBULATORY_CARE_PROVIDER_SITE_OTHER): Payer: Medicare Other

## 2013-08-19 DIAGNOSIS — I739 Peripheral vascular disease, unspecified: Secondary | ICD-10-CM

## 2013-08-19 DIAGNOSIS — E1142 Type 2 diabetes mellitus with diabetic polyneuropathy: Secondary | ICD-10-CM

## 2013-08-19 DIAGNOSIS — E1140 Type 2 diabetes mellitus with diabetic neuropathy, unspecified: Secondary | ICD-10-CM

## 2013-08-19 DIAGNOSIS — L608 Other nail disorders: Secondary | ICD-10-CM

## 2013-08-19 DIAGNOSIS — E1149 Type 2 diabetes mellitus with other diabetic neurological complication: Secondary | ICD-10-CM

## 2013-08-19 DIAGNOSIS — Q828 Other specified congenital malformations of skin: Secondary | ICD-10-CM

## 2013-08-19 NOTE — Patient Instructions (Signed)
Diabetes and Foot Care Diabetes may cause you to have problems because of poor blood supply (circulation) to your feet and legs. This may cause the skin on your feet to become thinner, break easier, and heal more slowly. Your skin may become dry, and the skin may peel and crack. You may also have nerve damage in your legs and feet causing decreased feeling in them. You may not notice minor injuries to your feet that could lead to infections or more serious problems. Taking care of your feet is one of the most important things you can do for yourself.  HOME CARE INSTRUCTIONS  Wear shoes at all times, even in the house. Do not go barefoot. Bare feet are easily injured.  Check your feet daily for blisters, cuts, and redness. If you cannot see the bottom of your feet, use a mirror or ask someone for help.  Wash your feet with warm water (do not use hot water) and mild soap. Then pat your feet and the areas between your toes until they are completely dry. Do not soak your feet as this can dry your skin.  Apply a moisturizing lotion or petroleum jelly (that does not contain alcohol and is unscented) to the skin on your feet and to dry, brittle toenails. Do not apply lotion between your toes.  Trim your toenails straight across. Do not dig under them or around the cuticle. File the edges of your nails with an emery board or nail file.  Do not cut corns or calluses or try to remove them with medicine.  Wear clean socks or stockings every day. Make sure they are not too tight. Do not wear knee-high stockings since they may decrease blood flow to your legs.  Wear shoes that fit properly and have enough cushioning. To break in new shoes, wear them for just a few hours a day. This prevents you from injuring your feet. Always look in your shoes before you put them on to be sure there are no objects inside.  Do not cross your legs. This may decrease the blood flow to your feet.  If you find a minor scrape,  cut, or break in the skin on your feet, keep it and the skin around it clean and dry. These areas may be cleansed with mild soap and water. Do not cleanse the area with peroxide, alcohol, or iodine.  When you remove an adhesive bandage, be sure not to damage the skin around it.  If you have a wound, look at it several times a day to make sure it is healing.  Do not use heating pads or hot water bottles. They may burn your skin. If you have lost feeling in your feet or legs, you may not know it is happening until it is too late.  Make sure your health care provider performs a complete foot exam at least annually or more often if you have foot problems. Report any cuts, sores, or bruises to your health care provider immediately. SEEK MEDICAL CARE IF:   You have an injury that is not healing.  You have cuts or breaks in the skin.  You have an ingrown nail.  You notice redness on your legs or feet.  You feel burning or tingling in your legs or feet.  You have pain or cramps in your legs and feet.  Your legs or feet are numb.  Your feet always feel cold. SEEK IMMEDIATE MEDICAL CARE IF:   There is increasing redness,   swelling, or pain in or around a wound.  There is a red line that goes up your leg.  Pus is coming from a wound.  You develop a fever or as directed by your health care provider.  You notice a bad smell coming from an ulcer or wound. Document Released: 02/11/2000 Document Revised: 10/16/2012 Document Reviewed: 07/23/2012 Exit[employer] Patient Information 2015 [employer]. This information is not intended to replace advice given to you by your health care provider. Make sure you discuss any questions you have with your health care provider.  

## 2013-08-19 NOTE — Progress Notes (Signed)
   Subjective:    Patient ID: [name], male    DOB: [birth year], 80 y.o.   MRN: [id number]  HPI  Routine debride,no pain or any other issues at this time  Review of Systems no new findings or systemic changes noted     Objective:   Physical Exam Lower extremity objective findings vascular status is intact with pedal pulses DP plus one over 4 PT nonpalpable bilateral absent hair growth diminished skin texture and turgor mild edema noted neurologically epicritic and proprioceptive sensations diminished on Semmes Weinstein to forefoot digits and arch area bilateral. No open wounds ulcerations. Nails thick criptotic incurvated hallux nails bilateral most notable patient's of the nails with break in off on it and twist socks according to the patient. No open wounds ulcerations no secondary infection       Assessment & Plan:  Assessment diabetes with peripheral neuropathy and other complications history peripheral vascular disease and compromise noted nails thick brittle dystrophic friable criptotic hallux second third and fourth digits bilateral debridement at this time return for future palliative care is needed suggest 3 month followup  Harriet Masson DP

## 2013-09-23 ENCOUNTER — Ambulatory Visit: Payer: Medicare Other | Admitting: Gastroenterology

## 2013-10-13 ENCOUNTER — Telehealth: Payer: Self-pay | Admitting: *Deleted

## 2013-10-13 NOTE — Telephone Encounter (Signed)
Returned CPAP supply order to [facility]. 

## 2013-10-27 ENCOUNTER — Encounter (INDEPENDENT_AMBULATORY_CARE_PROVIDER_SITE_OTHER): Payer: Self-pay

## 2013-10-27 ENCOUNTER — Ambulatory Visit (INDEPENDENT_AMBULATORY_CARE_PROVIDER_SITE_OTHER): Payer: Medicare Other | Admitting: Gastroenterology

## 2013-10-27 ENCOUNTER — Encounter: Payer: Self-pay | Admitting: Gastroenterology

## 2013-10-27 VITALS — BP 118/56 | HR 66 | Temp 98.2°F | Ht 71.5 in | Wt 297.0 lb

## 2013-10-27 DIAGNOSIS — I6529 Occlusion and stenosis of unspecified carotid artery: Secondary | ICD-10-CM

## 2013-10-27 DIAGNOSIS — R5381 Other malaise: Secondary | ICD-10-CM

## 2013-10-27 DIAGNOSIS — E039 Hypothyroidism, unspecified: Secondary | ICD-10-CM | POA: Insufficient documentation

## 2013-10-27 DIAGNOSIS — R5383 Other fatigue: Secondary | ICD-10-CM

## 2013-10-27 DIAGNOSIS — K5900 Constipation, unspecified: Secondary | ICD-10-CM | POA: Insufficient documentation

## 2013-10-27 MED ORDER — LINACLOTIDE 145 MCG PO CAPS: 145.0000 ug | ORAL_CAPSULE | Freq: Every day | ORAL | Status: DC

## 2013-10-27 NOTE — Patient Instructions (Signed)
1. Start Linzess one pill on empty stomach daily to help improve bowel function. 2. Please have your labs done. 3. Limit foods on the FODMAP sheet provided today. These foods tend to increase gas and bloating.

## 2013-10-27 NOTE — Progress Notes (Signed)
Primary Care Physician: Alonza Bogus, MD  Primary Gastroenterologist:  Garfield Cornea, MD   Chief Complaint  Patient presents with  . Follow-up    HPI: [name] is a 80 y.o. male here for followup of colonoscopy. He underwent an ileocolonoscopy back in June for change in bowel habits. At that time he was having more frequent stools. Noted to have single to adenoma removed. Status post segmental biopsies to evaluate for microscopic colitis, he had mild inflammation noted in the ascending colon but felt to be very nonspecific. Shortly after his colonoscopy call back in with complaints of constipation which was worse his baseline.  Feels like never has a good BM. Stools are solid and sometimes hard. Still lot of gas. BM every 2-3 days. Very difficult historian. Denies diarrhea. No melena, brbpr. Feels tired all the time. Sees Dr. Hinda Lenis for renal insufficiency. Miralax didn't help BMs. Restora increased his gas. Prilosec controls GERD but "causes gas".     Current Outpatient Prescriptions  Medication Sig Dispense Refill  . acarbose (PRECOSE) 100 MG tablet Take 100 mg by mouth 3 (three) times daily with meals.      Marland Kitchen allopurinol (ZYLOPRIM) 300 MG tablet Take 150 mg by mouth at bedtime.       Marland Kitchen amLODipine (NORVASC) 10 MG tablet TAKE 1 TABLET BY MOUTH ONCE A DAY.  30 tablet  9  . aspirin EC 81 MG tablet Take 81 mg by mouth every morning.       . Cholecalciferol (VITAMIN D3) 1000 UNITS CAPS Take 1 capsule by mouth 2 (two) times daily.       . clopidogrel (PLAVIX) 75 MG tablet Take 75 mg by mouth every morning.      . furosemide (LASIX) 20 MG tablet Take 20-40 mg by mouth 2 (two) times daily. Takes 40mg  in the morning and takes 20mg  at bedtime      . gabapentin (NEURONTIN) 300 MG capsule Take 300 mg by mouth 4 (four) times daily as needed (takes three times daily but may take one in between if needed).      Marland Kitchen glipiZIDE (GLUCOTROL) 10 MG tablet Take 10 mg by mouth 2 (two) times  daily.       Marland Kitchen HYDROcodone-acetaminophen (NORCO) 10-325 MG per tablet Take 1 tablet by mouth 4 (four) times daily as needed for pain.      Marland Kitchen insulin glargine (LANTUS) 100 UNIT/ML injection Inject 60 Units into the skin 2 (two) times daily.       . isosorbide dinitrate (ISORDIL) 20 MG tablet Take 1 tablet (20 mg total) by mouth 3 (three) times daily.  90 tablet  10  . levothyroxine (SYNTHROID, LEVOTHROID) 150 MCG tablet Take 150 mcg by mouth daily before breakfast.      . mometasone (ELOCON) 0.1 % ointment Apply 1 application topically as directed.       . nitroGLYCERIN (NITROSTAT) 0.4 MG SL tablet Place 1 tablet (0.4 mg total) under the tongue every 5 (five) minutes as needed for chest pain.  25 tablet  2  . omeprazole (PRILOSEC) 20 MG capsule Take 20 mg by mouth at bedtime.       . simvastatin (ZOCOR) 80 MG tablet Take 40 mg by mouth at bedtime.      . [name] (JANUVIA) 100 MG tablet Take 50 mg by mouth 2 (two) times daily.       . Tiotropium Bromide Monohydrate (SPIRIVA HANDIHALER IN) Inhale 1 capsule into the lungs  every morning.       . triamcinolone cream (KENALOG) 0.1 % Apply 1 application topically daily as needed (for irritation).        No current facility-administered medications for this visit.    Allergies as of 10/27/2013 - Review Complete 10/27/2013  Allergen Reaction Noted  . Demerol Nausea And Vomiting 10/20/2010    ROS:  General: Negative for anorexia, weight loss, fever, chills. See hpi ENT: Negative for hoarseness, difficulty swallowing , nasal congestion. CV: Negative for chest pain, angina, palpitations, dyspnea on exertion, peripheral edema.  Respiratory: Negative for dyspnea at rest, dyspnea on exertion, cough, sputum, wheezing.  GI: See history of present illness. GU:  Negative for dysuria, hematuria, urinary incontinence, urinary frequency, nocturnal urination.  Endo: Negative for unusual weight change.    Physical Examination:   BP 118/56  Pulse 66   Temp(Src) 98.2 F (36.8 C) (Oral)  Ht 5' 11.5" (1.816 m)  Wt 297 lb (134.718 kg)  BMI 40.85 kg/m2  General: Well-nourished, well-developed in no acute distress. Accompanied by wife.  Eyes: No icterus. Mouth: Oropharyngeal mucosa moist and pink , no lesions erythema or exudate. Lungs: Clear to auscultation bilaterally.  Heart: Regular rate and rhythm, no murmurs rubs or gallops.  Abdomen: Bowel sounds are normal, nontender, nondistended, no hepatosplenomegaly or masses, no abdominal bruits or hernia , no rebound or guarding.   Extremities: No lower extremity edema. No clubbing or deformities. Neuro: Alert and oriented x 4   Skin: Warm and dry, no jaundice.   Psych: Alert and cooperative, normal mood and affect.

## 2013-10-27 NOTE — Assessment & Plan Note (Addendum)
80 y/o male with complaints of constipation, fatigue with h/o hypothyroidism. Recent ileocolonoscopy as outlined above for change in bowel habits, or tendency towards frequent stools at that time. Now complains of constipation, may go couple of days without a bowel movement. Never feels like he has complete evacuation. Complains of significant fatigue and question possibility of anemia. Earlier this year did have a hemoglobin of 11.9. We will recheck his CBC and his TSH. Trial of Linzess 142mcg daily. Elimination diet using FODMAP diet to improve excess flatulence.

## 2013-10-29 LAB — CBC WITH DIFFERENTIAL/PLATELET
Basophils Absolute: 0 10*3/uL (ref 0.0–0.1)
Basophils Relative: 0 % (ref 0–1)
Eosinophils Absolute: 0.4 10*3/uL (ref 0.0–0.7)
Eosinophils Relative: 4 % (ref 0–5)
HCT: 36.3 % — ABNORMAL LOW (ref 39.0–52.0)
HEMOGLOBIN: 11.9 g/dL — AB (ref 13.0–17.0)
LYMPHS ABS: 1.7 10*3/uL (ref 0.7–4.0)
Lymphocytes Relative: 19 % (ref 12–46)
MCH: 31.5 pg (ref 26.0–34.0)
MCHC: 32.8 g/dL (ref 30.0–36.0)
MCV: 96 fL (ref 78.0–100.0)
Monocytes Absolute: 0.9 10*3/uL (ref 0.1–1.0)
Monocytes Relative: 10 % (ref 3–12)
NEUTROS PCT: 67 % (ref 43–77)
Neutro Abs: 6.2 10*3/uL (ref 1.7–7.7)
Platelets: 239 10*3/uL (ref 150–400)
RBC: 3.78 MIL/uL — AB (ref 4.22–5.81)
RDW: 15 % (ref 11.5–15.5)
WBC: 9.2 10*3/uL (ref 4.0–10.5)

## 2013-10-29 LAB — TSH: TSH: 2.548 u[IU]/mL (ref 0.350–4.500)

## 2013-10-29 NOTE — Progress Notes (Signed)
Cc to pcp °

## 2013-10-31 NOTE — Progress Notes (Signed)
Quick Note:  TSH ok. Hgb slightly low but stable for 4 past year.  He has h/o B12 def.  Please get iron/tibc, ferritin, b12, folate. Ifobt. ______

## 2013-11-04 ENCOUNTER — Other Ambulatory Visit: Payer: Self-pay | Admitting: Gastroenterology

## 2013-11-04 ENCOUNTER — Telehealth: Payer: Self-pay | Admitting: Internal Medicine

## 2013-11-04 DIAGNOSIS — R899 Unspecified abnormal finding in specimens from other organs, systems and tissues: Secondary | ICD-10-CM

## 2013-11-04 DIAGNOSIS — R5383 Other fatigue: Principal | ICD-10-CM

## 2013-11-04 DIAGNOSIS — R5381 Other malaise: Secondary | ICD-10-CM

## 2013-11-04 DIAGNOSIS — D649 Anemia, unspecified: Secondary | ICD-10-CM

## 2013-11-04 DIAGNOSIS — E440 Moderate protein-calorie malnutrition: Secondary | ICD-10-CM

## 2013-11-04 DIAGNOSIS — D518 Other vitamin B12 deficiency anemias: Secondary | ICD-10-CM

## 2013-11-04 NOTE — Telephone Encounter (Signed)
PATIENTS WIFE BARBARA IS CALLING FOR [facility] LAB RESULTS, PLEASE ADVISE?

## 2013-11-04 NOTE — Telephone Encounter (Signed)
I spoke with Mrs. Urias.

## 2013-11-05 ENCOUNTER — Other Ambulatory Visit: Payer: Self-pay

## 2013-11-05 DIAGNOSIS — R5383 Other fatigue: Principal | ICD-10-CM

## 2013-11-05 DIAGNOSIS — D649 Anemia, unspecified: Secondary | ICD-10-CM

## 2013-11-05 DIAGNOSIS — R5381 Other malaise: Secondary | ICD-10-CM

## 2013-11-05 DIAGNOSIS — R899 Unspecified abnormal finding in specimens from other organs, systems and tissues: Secondary | ICD-10-CM

## 2013-11-05 DIAGNOSIS — E440 Moderate protein-calorie malnutrition: Secondary | ICD-10-CM

## 2013-11-05 DIAGNOSIS — D518 Other vitamin B12 deficiency anemias: Secondary | ICD-10-CM

## 2013-11-05 NOTE — Progress Notes (Signed)
Quick Note:  Noted. Just do iron/tibc, ferritin.  ifobt. ______

## 2013-11-06 LAB — IRON AND TIBC
%SAT: 23 % (ref 20–55)
IRON: 72 ug/dL (ref 42–165)
TIBC: 314 ug/dL (ref 215–435)
UIBC: 242 ug/dL (ref 125–400)

## 2013-11-07 LAB — FERRITIN: Ferritin: 73 ng/mL (ref 22–322)

## 2013-11-11 ENCOUNTER — Encounter: Payer: Self-pay | Admitting: Cardiovascular Disease

## 2013-11-11 ENCOUNTER — Ambulatory Visit (INDEPENDENT_AMBULATORY_CARE_PROVIDER_SITE_OTHER): Payer: Medicare Other | Admitting: Cardiovascular Disease

## 2013-11-11 VITALS — BP 142/62 | HR 71 | Ht 71.0 in | Wt 286.0 lb

## 2013-11-11 DIAGNOSIS — I5022 Chronic systolic (congestive) heart failure: Secondary | ICD-10-CM

## 2013-11-11 DIAGNOSIS — I739 Peripheral vascular disease, unspecified: Secondary | ICD-10-CM

## 2013-11-11 DIAGNOSIS — I10 Essential (primary) hypertension: Secondary | ICD-10-CM

## 2013-11-11 DIAGNOSIS — I25810 Atherosclerosis of coronary artery bypass graft(s) without angina pectoris: Secondary | ICD-10-CM

## 2013-11-11 DIAGNOSIS — E785 Hyperlipidemia, unspecified: Secondary | ICD-10-CM

## 2013-11-11 DIAGNOSIS — I6529 Occlusion and stenosis of unspecified carotid artery: Secondary | ICD-10-CM

## 2013-11-11 NOTE — Assessment & Plan Note (Signed)
Controlled on current medications 

## 2013-11-11 NOTE — Assessment & Plan Note (Signed)
On statin therapy followed by his PCP 

## 2013-11-11 NOTE — Assessment & Plan Note (Signed)
Status post left vertebral artery stenting back in 1998 by Dr. Earnie Larsson in [place]. I intramembranous at the time of cardiac catheterization 12/19/12 revealing this to be patent.

## 2013-11-11 NOTE — Assessment & Plan Note (Signed)
History of CAD status post coronary artery bypass grafting in 1998 the LIMA to his LAD, vein graft to an obtuse marginal branch and to the RCA. RS cast him 12/19/12 revealing similar anatomy to his prior cath in 2008 where he was found have an occluded OM branch vein graft. He denies chest pain. Does get mildly dyspneic. He has normal ejection fraction.

## 2013-11-11 NOTE — Patient Instructions (Signed)
We request that you follow-up in: 6 months with an extender and in 1 year with Dr Berry  You will receive a reminder letter in the mail two months in advance. If you don't receive a letter, please call our office to schedule the follow-up appointment.   

## 2013-11-11 NOTE — Progress Notes (Signed)
11/11/2013 KIREE DEJARNETTE   05/04/1933  [id number]  Primary Physician Alonza Bogus, MD Primary Cardiologist: Lorretta Harp MD Renae Gloss   HPI:  The patient is 80 year old moderately overweight married Caucasian male father of 1 child (1 deceased), grandfather to 3 stepchild who is accompanied by his wife today. He has a history of CAD and PVOD. He had coronary artery bypass grafting in March 1998 with a LIMA to his LAD, a vein to the circumflex obtuse marginal branch, a vein to the right coronary artery. He also had left vertebral artery stenting by Dr. Leitha Schuller in [place] after his surgery in May 1998. His other problems include hypertension, hyperlipidemia, non-insulin-dependent diabetes and COPD. He has moderate renal insufficiency and was taken off ACE. He has obstructive sleep apnea on CPAP and is compliant. Cath performed May 29, 2006 revealed a patent LIMA to the LAD, 90% apical LAD lesion, total native circumflex and RCA with occluded vein to the OM branch, and a patent vein to the distal right. His vertebral stent was widely patent at that time. His EF was 55% with mild inferobasal hypokinesia April 2013. His last Myoview performed March 2014 revealed mild inferolateral ischemia explainable by his anatomy. It was a low risk study. Dr. Luan Pulling follows his lipid profile. He is in the office today complaining of exertional bilateral shoulder pain and SOB. He says when ever he walks to get the mail or takes the trash to the street he get SOB and has pain across his shoulders. He has to stop and rest. His symptoms seems to be increasing in severity and take longer to relieve with rest. A Myoview stress test was performed several days ago that showed inferolateral scar with moderate peri-infarct ischemia. The patient's story was compelling for angina/anginal equivalent. Based on this I proceeded with cardiac catheterization 12/19/12 revealing anatomy similar to his prior  cath in 08 including a patent stent to his left vertebral artery. He saw Tenny Craw PAC back 6 months ago and since that time he has remained clinically stable.    Current Outpatient Prescriptions  Medication Sig Dispense Refill  . acarbose (PRECOSE) 100 MG tablet Take 100 mg by mouth 3 (three) times daily with meals.      Marland Kitchen allopurinol (ZYLOPRIM) 300 MG tablet Take 150 mg by mouth at bedtime.       Marland Kitchen amLODipine (NORVASC) 10 MG tablet TAKE 1 TABLET BY MOUTH ONCE A DAY.  30 tablet  9  . aspirin EC 81 MG tablet Take 81 mg by mouth every morning.       . Cholecalciferol (VITAMIN D3) 1000 UNITS CAPS Take 1 capsule by mouth 2 (two) times daily.       . clopidogrel (PLAVIX) 75 MG tablet Take 75 mg by mouth every morning.      . furosemide (LASIX) 20 MG tablet Take 20-40 mg by mouth 2 (two) times daily. Takes 40mg  in the morning and takes 20mg  at bedtime      . gabapentin (NEURONTIN) 300 MG capsule Take 300 mg by mouth 4 (four) times daily as needed (takes three times daily but may take one in between if needed).      Marland Kitchen glipiZIDE (GLUCOTROL) 10 MG tablet Take 10 mg by mouth 2 (two) times daily.       Marland Kitchen HYDROcodone-acetaminophen (NORCO) 10-325 MG per tablet Take 1 tablet by mouth 4 (four) times daily as needed for pain.      Marland Kitchen insulin  glargine (LANTUS) 100 UNIT/ML injection Inject 60 Units into the skin 2 (two) times daily.       . isosorbide dinitrate (ISORDIL) 20 MG tablet Take 1 tablet (20 mg total) by mouth 3 (three) times daily.  90 tablet  10  . levothyroxine (SYNTHROID, LEVOTHROID) 150 MCG tablet Take 150 mcg by mouth daily before breakfast.      . Linaclotide (LINZESS) 145 MCG CAPS capsule Take 1 capsule (145 mcg total) by mouth daily. On empty stomach  30 capsule  5  . mometasone (ELOCON) 0.1 % ointment Apply 1 application topically as directed.       . nitroGLYCERIN (NITROSTAT) 0.4 MG SL tablet Place 1 tablet (0.4 mg total) under the tongue every 5 (five) minutes as needed for chest pain.  25  tablet  2  . omeprazole (PRILOSEC) 20 MG capsule Take 20 mg by mouth at bedtime.       . simvastatin (ZOCOR) 80 MG tablet Take 40 mg by mouth at bedtime.      . [name] (JANUVIA) 100 MG tablet Take 50 mg by mouth 2 (two) times daily.       . Tiotropium Bromide Monohydrate (SPIRIVA HANDIHALER IN) Inhale 1 capsule into the lungs every morning.       . triamcinolone cream (KENALOG) 0.1 % Apply 1 application topically daily as needed (for irritation).        No current facility-administered medications for this visit.    Allergies  Allergen Reactions  . Demerol Nausea And Vomiting    History   Social History  . Marital Status: Married    Spouse Name: N/A    Number of Children: N/A  . Years of Education: N/A   Occupational History  . retired    Social History Main Topics  . Smoking status: Former Smoker -- 3.50 packs/day for 40 years    Types: Cigarettes    Quit date: 12/09/1985  . Smokeless tobacco: Former Systems developer    Types: Chew     Comment: 12/17/2012 "quit chewing tobacco in 1998"  . Alcohol Use: Yes     Comment: 12/17/2012 "1-2 beers/wk" - socially   . Drug Use: No  . Sexual Activity: No   Other Topics Concern  . Not on file   Social History Narrative  . No narrative on file     Review of Systems: General: negative for chills, fever, night sweats or weight changes.  Cardiovascular: negative for chest pain, dyspnea on exertion, edema, orthopnea, palpitations, paroxysmal nocturnal dyspnea or shortness of breath Dermatological: negative for rash Respiratory: negative for cough or wheezing Urologic: negative for hematuria Abdominal: negative for nausea, vomiting, diarrhea, bright red blood per rectum, melena, or hematemesis Neurologic: negative for visual changes, syncope, or dizziness All other systems reviewed and are otherwise negative except as noted above.    Blood pressure 142/62, pulse 71, height 5\' 11"  (1.803 m), weight 286 lb (129.729 kg).  General  appearance: alert and no distress Neck: no adenopathy, no JVD, supple, symmetrical, trachea midline, thyroid not enlarged, symmetric, no tenderness/mass/nodules and soft left carotid bruit Lungs: clear to auscultation bilaterally Heart: regular rate and rhythm, S1, S2 normal, no murmur, click, rub or gallop Extremities: extremities normal, atraumatic, no cyanosis or edema  EKG normal sinus rhythm at 71 without ST or T wave changes  ASSESSMENT AND PLAN:   HTN (hypertension) Controlled on current medications  CAD, CABG '98, cath '08, low risk Myoview Feb 2012, stable cath 12/19/12 with chronically occluded VG  to LCX, patent LIMA-LAD & VG to dominant rt.  History of CAD status post coronary artery bypass grafting in 1998 the LIMA to his LAD, vein graft to an obtuse marginal branch and to the RCA. RS cast him 12/19/12 revealing similar anatomy to his prior cath in 2008 where he was found have an occluded OM branch vein graft. He denies chest pain. Does get mildly dyspneic. He has normal ejection fraction.  Hyperlipidemia On statin therapy followed by his PCP  PVD, Lt Vertebral artery PTA in '08, known moderate ICA disease Status post left vertebral artery stenting back in 1998 by Dr. Earnie Larsson in [place]. I intramembranous at the time of cardiac catheterization 12/19/12 revealing this to be patent.      Lorretta Harp MD FACP,FACC,FAHA, [facility] 11/11/2013 3:30 PM

## 2013-11-12 ENCOUNTER — Other Ambulatory Visit: Payer: Self-pay | Admitting: Cardiovascular Disease

## 2013-11-17 ENCOUNTER — Ambulatory Visit (INDEPENDENT_AMBULATORY_CARE_PROVIDER_SITE_OTHER): Payer: Medicare Other

## 2013-11-17 DIAGNOSIS — K5900 Constipation, unspecified: Secondary | ICD-10-CM

## 2013-11-18 LAB — IFOBT (OCCULT BLOOD): IFOBT: NEGATIVE

## 2013-11-18 NOTE — Progress Notes (Signed)
Pt returned IFOBT test and it was NEGATIVE 

## 2013-11-19 NOTE — Progress Notes (Signed)
Quick Note:  No evidence of IDA. ifobt was negative. Likely anemia of chronic disease. Would recommend repeat CBC and ferritin in 3 months followed by OV with Dr. Gala Romney. (reason for OV, chronic anemia, GERD, bowel issues) ______

## 2013-11-25 ENCOUNTER — Ambulatory Visit (INDEPENDENT_AMBULATORY_CARE_PROVIDER_SITE_OTHER): Payer: Medicare Other

## 2013-11-25 ENCOUNTER — Telehealth: Payer: Self-pay | Admitting: *Deleted

## 2013-11-25 DIAGNOSIS — I739 Peripheral vascular disease, unspecified: Secondary | ICD-10-CM

## 2013-11-25 DIAGNOSIS — E1140 Type 2 diabetes mellitus with diabetic neuropathy, unspecified: Secondary | ICD-10-CM

## 2013-11-25 DIAGNOSIS — E1149 Type 2 diabetes mellitus with other diabetic neurological complication: Secondary | ICD-10-CM

## 2013-11-25 DIAGNOSIS — L608 Other nail disorders: Secondary | ICD-10-CM

## 2013-11-25 DIAGNOSIS — E1142 Type 2 diabetes mellitus with diabetic polyneuropathy: Secondary | ICD-10-CM

## 2013-11-25 NOTE — Telephone Encounter (Signed)
Okay to hold Plavix for his blepharoplasty

## 2013-11-25 NOTE — Patient Instructions (Signed)
Diabetes and Foot Care Diabetes may cause you to have problems because of poor blood supply (circulation) to your feet and legs. This may cause the skin on your feet to become thinner, break easier, and heal more slowly. Your skin may become dry, and the skin may peel and crack. You may also have nerve damage in your legs and feet causing decreased feeling in them. You may not notice minor injuries to your feet that could lead to infections or more serious problems. Taking care of your feet is one of the most important things you can do for yourself.  HOME CARE INSTRUCTIONS  Wear shoes at all times, even in the house. Do not go barefoot. Bare feet are easily injured.  Check your feet daily for blisters, cuts, and redness. If you cannot see the bottom of your feet, use a mirror or ask someone for help.  Wash your feet with warm water (do not use hot water) and mild soap. Then pat your feet and the areas between your toes until they are completely dry. Do not soak your feet as this can dry your skin.  Apply a moisturizing lotion or petroleum jelly (that does not contain alcohol and is unscented) to the skin on your feet and to dry, brittle toenails. Do not apply lotion between your toes.  Trim your toenails straight across. Do not dig under them or around the cuticle. File the edges of your nails with an emery board or nail file.  Do not cut corns or calluses or try to remove them with medicine.  Wear clean socks or stockings every day. Make sure they are not too tight. Do not wear knee-high stockings since they may decrease blood flow to your legs.  Wear shoes that fit properly and have enough cushioning. To break in new shoes, wear them for just a few hours a day. This prevents you from injuring your feet. Always look in your shoes before you put them on to be sure there are no objects inside.  Do not cross your legs. This may decrease the blood flow to your feet.  If you find a minor scrape,  cut, or break in the skin on your feet, keep it and the skin around it clean and dry. These areas may be cleansed with mild soap and water. Do not cleanse the area with peroxide, alcohol, or iodine.  When you remove an adhesive bandage, be sure not to damage the skin around it.  If you have a wound, look at it several times a day to make sure it is healing.  Do not use heating pads or hot water bottles. They may burn your skin. If you have lost feeling in your feet or legs, you may not know it is happening until it is too late.  Make sure your health care provider performs a complete foot exam at least annually or more often if you have foot problems. Report any cuts, sores, or bruises to your health care provider immediately. SEEK MEDICAL CARE IF:   You have an injury that is not healing.  You have cuts or breaks in the skin.  You have an ingrown nail.  You notice redness on your legs or feet.  You feel burning or tingling in your legs or feet.  You have pain or cramps in your legs and feet.  Your legs or feet are numb.  Your feet always feel cold. SEEK IMMEDIATE MEDICAL CARE IF:   There is increasing redness,   swelling, or pain in or around a wound.  There is a red line that goes up your leg.  Pus is coming from a wound.  You develop a fever or as directed by your health care provider.  You notice a bad smell coming from an ulcer or wound. Document Released: 02/11/2000 Document Revised: 10/16/2012 Document Reviewed: 07/23/2012 Exit[employer] Patient Information 2015 [employer]. This information is not intended to replace advice given to you by your health care provider. Make sure you discuss any questions you have with your health care provider.  

## 2013-11-25 NOTE — Progress Notes (Signed)
   Subjective:    Patient ID: [name], male    DOB: 10/14/1933, 80 y.o.   MRN: [id number]  HPI patient presents for diabetic foot and nail care followup    Review of Systems no new findings or systemic changes noted     Objective:   Physical Exam Vascular status is intact pedal pulses DP plus one over 4 PT thready nonpalpable bilateral epicritic sensation decreased on Semmes Weinstein to forefoot digits and arch bilateral there is normal plantar response no open wounds no ulcers no secondary infections nails criptotic incurvated keratoses are minimal at this time slight pinch callus of the hallux and sub-fifth bilateral although not adequate enough for debridement or needing of any debridement at this time no open wounds no secondary infection is noted digital contractures also identified semirigid       Assessment & Plan:  Assessment this time diabetes with history peripheral neuropathy and angiopathy. Mild vascular disease is noted patient does have thick brittle crumbly dystrophic nails 1 through 5 bilateral debrided return for future diabetic foot and palliative nail care every 3 months as recommended  Harriet Masson DPM

## 2013-11-25 NOTE — Telephone Encounter (Signed)
Surgical clearance for bilateral upper blephareplasty.  Dr Gwenlyn Found, please advise

## 2013-11-25 NOTE — Telephone Encounter (Signed)
Okay to have cataract

## 2013-11-26 ENCOUNTER — Telehealth: Payer: Self-pay | Admitting: Internal Medicine

## 2013-11-26 NOTE — Telephone Encounter (Signed)
Tried to call pt- NA-[facility]OM with results.  

## 2013-11-26 NOTE — Telephone Encounter (Signed)
Pt's wife called today to make another OV for her husband. He is still having problems and was seen on 8/31 by [facility]. She also said that they never heard back from [place] about his lab results. She is aware of OV on 10/29 at 1030 with [facility]. Please call them at [phone] with his lab results

## 2013-11-26 NOTE — Telephone Encounter (Signed)
Form signed by Dr Gwenlyn Found and faxed back

## 2013-12-01 ENCOUNTER — Other Ambulatory Visit: Payer: Self-pay | Admitting: Gastroenterology

## 2013-12-01 DIAGNOSIS — D649 Anemia, unspecified: Secondary | ICD-10-CM

## 2013-12-01 DIAGNOSIS — D6489 Other specified anemias: Secondary | ICD-10-CM

## 2013-12-25 ENCOUNTER — Encounter: Payer: Self-pay | Admitting: Gastroenterology

## 2013-12-25 ENCOUNTER — Ambulatory Visit (INDEPENDENT_AMBULATORY_CARE_PROVIDER_SITE_OTHER): Payer: Medicare Other | Admitting: Gastroenterology

## 2013-12-25 VITALS — BP 142/71 | HR 62 | Temp 97.0°F | Ht 71.5 in | Wt 278.0 lb

## 2013-12-25 DIAGNOSIS — K5900 Constipation, unspecified: Secondary | ICD-10-CM

## 2013-12-25 DIAGNOSIS — K219 Gastro-esophageal reflux disease without esophagitis: Secondary | ICD-10-CM | POA: Insufficient documentation

## 2013-12-25 DIAGNOSIS — D638 Anemia in other chronic diseases classified elsewhere: Secondary | ICD-10-CM | POA: Insufficient documentation

## 2013-12-25 DIAGNOSIS — I6529 Occlusion and stenosis of unspecified carotid artery: Secondary | ICD-10-CM

## 2013-12-25 NOTE — Assessment & Plan Note (Signed)
Negative ifobt. Normal iron studies. Colonoscopy up-to-date. Retrieve recent outside labs. Keep periodic checks on iron/ferritin. 6 month return.

## 2013-12-25 NOTE — Assessment & Plan Note (Signed)
Continue omeprazole daily. Return in 6 months.

## 2013-12-25 NOTE — Progress Notes (Signed)
Referring Provider: Alonza Bogus, MD Primary Care Physician:  Alonza Bogus, MD Primary GI: Dr. Gala Romney   Chief Complaint  Patient presents with  . Abdominal Pain    HPI:   [name] presents today with history of constipation, GERD, abdominal bloating. Started on Linzess 145 mcg in Aug 2015 and asked to utilize FODMAP diet. TSH recently normal. Mild chronic normocytic anemia noted with normal ferritin and iron. Ifobt negative. Likely chronic disease.   Quit taking Linzess. Took for one month. 30 pills were 50$. BMs are soft. Prilosec daily. No dysphagia. No abdominal pain. Denies bloating. No concerns. Constipation resolved.   Past Medical History  Diagnosis Date  . Diabetes mellitus type II   . Hypothyroidism   . Hypertension   . GERD (gastroesophageal reflux disease)   . Angina   . CHF (congestive heart failure)     diastolic  . Heart attack   . COPD (chronic obstructive pulmonary disease)   . Coronary artery disease     CABG '98, cath '08, low risk Nuc 3/14  . Obesity   . PVD (peripheral vascular disease)     Lt vertebral stent '98  . Diastolic dysfunction     grade 1 with an EF of 60-65% 4/13  . OSA on CPAP   . High cholesterol   . Chronic bronchitis     "usually get it q yr" (12/17/2012)  . Exertional shortness of breath   . Arthritis     "knees, hips" (12/17/2012)  . Gout     "ankles, legs" (12/17/2012)  . Pneumonia 05/2011  . CAD, CABG '98, cath '08, low risk Myoview Feb 2012, stable cath 12/19/12 with chronically occluded VG to LCX, patent LIMA-LAD & VG to dominant rt.  06/01/2011    Patent LIMA-LAD, oclluded SVG-CFX, patent SVG-RCA in 2008   . Bradycardia, to 30 12/20/2012  . Nephrolithiasis   . CKD (chronic kidney disease) stage 3, GFR 30-59 ml/min 12/20/2012    Past Surgical History  Procedure Laterality Date  . Back surgery    . Replantation finger Right 2000    "little finger" (12/17/2012)  . Cholecystectomy    . Tonsillectomy   ?1960's  . Knee arthroscopy Left 1990's  . Lumbar laminectomy/decompression microdiscectomy  04/11/2011    Procedure: LUMBAR LAMINECTOMY/DECOMPRESSION MICRODISCECTOMY 1 LEVEL;  Surgeon: Peggyann Shoals, MD;  Location: [facility];  Service: Neurosurgery;  Laterality: Right;  RIGHT Lumbar Three-Four laminectomy with resection of synovial cyst  . Angioplasty  '99, 00, March 06  . Tibia fracture surgery Right ~ 2012  . Coronary artery bypass graft  04/1996    "CABG X6" (12/17/2012)  . Cataract extraction w/ intraocular lens  implant, bilateral    . Lithotripsy      "once" (12/17/2012)  . Cardiac catheterization  April 2006; 12/19/2012  . Coronary angioplasty with stent placement      "I've had a total of 3-4 stents put in" (12/17/2012)  . Fracture surgery    . Colonoscopy  08/09/2004    YQM:VHQIONG diverticula.  Remainder of colonic mucosa appeared normal/normal rectum  . Vertiberal artery stent      1998, 99% blocked.  . Colonoscopy N/A 07/31/2013    EXB:[id number] colonic polyp-removed as described above. Status post segmental biopsy to evaluate for microscopic colitis. bx tubular adenoma, asc colon folcal active colitis/lyphoid aggregates (nonspecific)    Current Outpatient Prescriptions  Medication Sig Dispense Refill  . acarbose (PRECOSE) 100 MG tablet Take  100 mg by mouth 3 (three) times daily with meals.      Marland Kitchen allopurinol (ZYLOPRIM) 300 MG tablet Take 150 mg by mouth at bedtime.       Marland Kitchen amLODipine (NORVASC) 10 MG tablet TAKE 1 TABLET BY MOUTH ONCE A DAY.  90 tablet  2  . aspirin EC 81 MG tablet Take 81 mg by mouth every morning.       . Cholecalciferol (VITAMIN D3) 1000 UNITS CAPS Take 1 capsule by mouth 2 (two) times daily.       . clopidogrel (PLAVIX) 75 MG tablet Take 75 mg by mouth every morning.      . furosemide (LASIX) 20 MG tablet Take 20-40 mg by mouth 2 (two) times daily. Takes 40mg  in the morning and takes 20mg  at bedtime      . gabapentin (NEURONTIN) 300 MG capsule Take 300  mg by mouth 4 (four) times daily as needed (takes three times daily but may take one in between if needed).      Marland Kitchen glipiZIDE (GLUCOTROL) 10 MG tablet Take 10 mg by mouth 2 (two) times daily.       Marland Kitchen HYDROcodone-acetaminophen (NORCO) 10-325 MG per tablet Take 1 tablet by mouth 4 (four) times daily as needed for pain.      Marland Kitchen insulin glargine (LANTUS) 100 UNIT/ML injection Inject 60 Units into the skin 2 (two) times daily.       . isosorbide dinitrate (ISORDIL) 20 MG tablet Take 1 tablet (20 mg total) by mouth 3 (three) times daily.  90 tablet  10  . levothyroxine (SYNTHROID, LEVOTHROID) 150 MCG tablet Take 150 mcg by mouth daily before breakfast.      . mometasone (ELOCON) 0.1 % ointment Apply 1 application topically as directed.       . nitroGLYCERIN (NITROSTAT) 0.4 MG SL tablet Place 1 tablet (0.4 mg total) under the tongue every 5 (five) minutes as needed for chest pain.  25 tablet  2  . omeprazole (PRILOSEC) 20 MG capsule Take 20 mg by mouth at bedtime.       . simvastatin (ZOCOR) 80 MG tablet Take 40 mg by mouth at bedtime.      . [name] (JANUVIA) 100 MG tablet Take 50 mg by mouth 2 (two) times daily.       . Tiotropium Bromide Monohydrate (SPIRIVA HANDIHALER IN) Inhale 1 capsule into the lungs every morning.       . triamcinolone cream (KENALOG) 0.1 % Apply 1 application topically daily as needed (for irritation).        No current facility-administered medications for this visit.    Allergies as of 12/25/2013 - Review Complete 12/25/2013  Allergen Reaction Noted  . Demerol Nausea And Vomiting 10/20/2010    Family History  Problem Relation Age of Onset  . Arthritis    . Cancer    . Diabetes    . Colon cancer Neg Hx     History   Social History  . Marital Status: Married    Spouse Name: N/A    Number of Children: N/A  . Years of Education: N/A   Occupational History  . retired    Social History Main Topics  . Smoking status: Former Smoker -- 3.50 packs/day for 40  years    Types: Cigarettes    Quit date: 12/09/1985  . Smokeless tobacco: Former Systems developer    Types: Chew     Comment: 12/17/2012 "quit chewing tobacco in 1998"  . Alcohol Use:  Yes     Comment: 12/17/2012 "1-2 beers/wk" - socially   . Drug Use: No  . Sexual Activity: No   Other Topics Concern  . None   Social History Narrative  . None    Review of Systems: As mentioned in HPI  Physical Exam: BP 142/71  Pulse 62  Temp(Src) 97 F (36.1 C) (Oral)  Ht 5' 11.5" (1.816 m)  Wt 278 lb (126.1 kg)  BMI 38.24 kg/m2 General:   Alert and oriented. No distress noted. Pleasant and cooperative.  Head:  Normocephalic and atraumatic. Eyes:  Conjuctiva clear without scleral icterus. Mouth:  Oral mucosa pink and moist. Good dentition. No lesions. Abdomen:  +BS, soft, largely obese, large AP diameter and difficult to appreciate HSM. non-tender and non-distended. Extremities:  Without edema. Neurologic:  Alert and  oriented x4;  grossly normal neurologically. Skin:  Intact without significant lesions or rashes. Psych:  Alert and cooperative. Normal mood and affect.

## 2013-12-25 NOTE — Patient Instructions (Addendum)
Follow a high fiber diet. If you have any further issues with constipation, please call us.   We will see you in 6 months  High-Fiber Diet Fiber is found in fruits, vegetables, and grains. A high-fiber diet encourages the addition of more whole grains, legumes, fruits, and vegetables in your diet. The recommended amount of fiber for adult males is 38 g per day. For adult females, it is 25 g per day. Pregnant and lactating women should get 28 g of fiber per day. If you have a digestive or bowel problem, ask your caregiver for advice before adding high-fiber foods to your diet. Eat a variety of high-fiber foods instead of only a select few type of foods.  PURPOSE  To increase stool bulk.  To make bowel movements more regular to prevent constipation.  To lower cholesterol.  To prevent overeating. WHEN IS THIS DIET USED?  It may be used if you have constipation and hemorrhoids.  It may be used if you have uncomplicated diverticulosis (intestine condition) and irritable bowel syndrome.  It may be used if you need help with weight management.  It may be used if you want to add it to your diet as a protective measure against atherosclerosis, diabetes, and cancer. SOURCES OF FIBER  Whole-grain breads and cereals.  Fruits, such as apples, oranges, bananas, berries, prunes, and pears.  Vegetables, such as green peas, carrots, sweet potatoes, beets, broccoli, cabbage, spinach, and artichokes.  Legumes, such split peas, soy, lentils.  Almonds. FIBER CONTENT IN FOODS Starches and Grains / Dietary Fiber (g)  Cheerios, 1 cup / 3 g  Corn Flakes cereal, 1 cup / 0.7 g  Rice crispy treat cereal, 1 cup / 0.3 g  Instant oatmeal (cooked),  cup / 2 g  Frosted wheat cereal, 1 cup / 5.1 g  Brown, long-grain rice (cooked), 1 cup / 3.5 g  White, long-grain rice (cooked), 1 cup / 0.6 g  Enriched macaroni (cooked), 1 cup / 2.5 g Legumes / Dietary Fiber (g)  Baked beans (canned, plain, or  vegetarian),  cup / 5.2 g  Kidney beans (canned),  cup / 6.8 g  Pinto beans (cooked),  cup / 5.5 g Breads and Crackers / Dietary Fiber (g)  Plain or honey graham crackers, 2 squares / 0.7 g  Saltine crackers, 3 squares / 0.3 g  Plain, salted pretzels, 10 pieces / 1.8 g  Whole-wheat bread, 1 slice / 1.9 g  White bread, 1 slice / 0.7 g  Raisin bread, 1 slice / 1.2 g  Plain bagel, 3 oz / 2 g  Flour tortilla, 1 oz / 0.9 g  Corn tortilla, 1 small / 1.5 g  Hamburger or hotdog bun, 1 small / 0.9 g Fruits / Dietary Fiber (g)  Apple with skin, 1 medium / 4.4 g  Sweetened applesauce,  cup / 1.5 g  Banana,  medium / 1.5 g  Grapes, 10 grapes / 0.4 g  Orange, 1 small / 2.3 g  Raisin, 1.5 oz / 1.6 g  Melon, 1 cup / 1.4 g Vegetables / Dietary Fiber (g)  Green beans (canned),  cup / 1.3 g  Carrots (cooked),  cup / 2.3 g  Broccoli (cooked),  cup / 2.8 g  Peas (cooked),  cup / 4.4 g  Mashed potatoes,  cup / 1.6 g  Lettuce, 1 cup / 0.5 g  Corn (canned),  cup / 1.6 g  Tomato,  cup / 1.1 g Document Released: 02/13/2005 Document Revised:  08/15/2011 Document Reviewed: 05/18/2011 Exit[employer] Patient Information 2015 [place], [place]. This information is not intended to replace advice given to you by your health care provider. Make sure you discuss any questions you have with your health care provider.

## 2013-12-25 NOTE — Assessment & Plan Note (Signed)
Resolved. Contact us if any further issues and will trial Amitiza, which may be more cost-effective. [facility] high fiber diet.

## 2013-12-25 NOTE — Progress Notes (Signed)
cc'ed to pcp °

## 2013-12-31 ENCOUNTER — Encounter: Payer: Self-pay | Admitting: Cardiovascular Disease

## 2014-01-13 ENCOUNTER — Encounter: Payer: Self-pay | Admitting: *Deleted

## 2014-01-13 ENCOUNTER — Other Ambulatory Visit: Payer: Self-pay | Admitting: *Deleted

## 2014-01-13 DIAGNOSIS — D649 Anemia, unspecified: Secondary | ICD-10-CM

## 2014-02-05 ENCOUNTER — Encounter (HOSPITAL_COMMUNITY): Payer: Self-pay | Admitting: Cardiovascular Disease

## 2014-02-10 ENCOUNTER — Other Ambulatory Visit: Payer: Self-pay | Admitting: Cardiovascular Disease

## 2014-02-24 ENCOUNTER — Other Ambulatory Visit: Payer: Medicare Other

## 2014-03-03 ENCOUNTER — Other Ambulatory Visit: Payer: Medicare Other

## 2014-03-03 ENCOUNTER — Ambulatory Visit (INDEPENDENT_AMBULATORY_CARE_PROVIDER_SITE_OTHER): Payer: Medicare Other

## 2014-03-03 DIAGNOSIS — E1140 Type 2 diabetes mellitus with diabetic neuropathy, unspecified: Secondary | ICD-10-CM

## 2014-03-03 DIAGNOSIS — M2040 Other hammer toe(s) (acquired), unspecified foot: Secondary | ICD-10-CM

## 2014-03-03 DIAGNOSIS — M2010 Hallux valgus (acquired), unspecified foot: Secondary | ICD-10-CM

## 2014-03-03 DIAGNOSIS — B351 Tinea unguium: Secondary | ICD-10-CM

## 2014-03-03 DIAGNOSIS — M79673 Pain in unspecified foot: Secondary | ICD-10-CM

## 2014-03-03 NOTE — Patient Instructions (Signed)
Diabetes and Foot Care Diabetes may cause you to have problems because of poor blood supply (circulation) to your feet and legs. This may cause the skin on your feet to become thinner, break easier, and heal more slowly. Your skin may become dry, and the skin may peel and crack. You may also have nerve damage in your legs and feet causing decreased feeling in them. You may not notice minor injuries to your feet that could lead to infections or more serious problems. Taking care of your feet is one of the most important things you can do for yourself.  HOME CARE INSTRUCTIONS  Wear shoes at all times, even in the house. Do not go barefoot. Bare feet are easily injured.  Check your feet daily for blisters, cuts, and redness. If you cannot see the bottom of your feet, use a mirror or ask someone for help.  Wash your feet with warm water (do not use hot water) and mild soap. Then pat your feet and the areas between your toes until they are completely dry. Do not soak your feet as this can dry your skin.  Apply a moisturizing lotion or petroleum jelly (that does not contain alcohol and is unscented) to the skin on your feet and to dry, brittle toenails. Do not apply lotion between your toes.  Trim your toenails straight across. Do not dig under them or around the cuticle. File the edges of your nails with an emery board or nail file.  Do not cut corns or calluses or try to remove them with medicine.  Wear clean socks or stockings every day. Make sure they are not too tight. Do not wear knee-high stockings since they may decrease blood flow to your legs.  Wear shoes that fit properly and have enough cushioning. To break in new shoes, wear them for just a few hours a day. This prevents you from injuring your feet. Always look in your shoes before you put them on to be sure there are no objects inside.  Do not cross your legs. This may decrease the blood flow to your feet.  If you find a minor scrape,  cut, or break in the skin on your feet, keep it and the skin around it clean and dry. These areas may be cleansed with mild soap and water. Do not cleanse the area with peroxide, alcohol, or iodine.  When you remove an adhesive bandage, be sure not to damage the skin around it.  If you have a wound, look at it several times a day to make sure it is healing.  Do not use heating pads or hot water bottles. They may burn your skin. If you have lost feeling in your feet or legs, you may not know it is happening until it is too late.  Make sure your health care provider performs a complete foot exam at least annually or more often if you have foot problems. Report any cuts, sores, or bruises to your health care provider immediately. SEEK MEDICAL CARE IF:   You have an injury that is not healing.  You have cuts or breaks in the skin.  You have an ingrown nail.  You notice redness on your legs or feet.  You feel burning or tingling in your legs or feet.  You have pain or cramps in your legs and feet.  Your legs or feet are numb.  Your feet always feel cold. SEEK IMMEDIATE MEDICAL CARE IF:   There is increasing redness,   swelling, or pain in or around a wound.  There is a red line that goes up your leg.  Pus is coming from a wound.  You develop a fever or as directed by your health care provider.  You notice a bad smell coming from an ulcer or wound. Document Released: 02/11/2000 Document Revised: 10/16/2012 Document Reviewed: 07/23/2012 Exit[employer] Patient Information 2015 [employer]. This information is not intended to replace advice given to you by your health care provider. Make sure you discuss any questions you have with your health care provider.  

## 2014-03-03 NOTE — Progress Notes (Signed)
   Subjective:    Patient ID: [name], male    DOB: Aug 15, 1933, 81 y.o.   MRN: [id number]  HPI Comments: "Cut my toenails"  Patient requesting information sent to Dr. Lenna Gilford for diabetic shoes. He said he did not like our shoes and insoles and wants to get them from [place].     Review of Systems no new findings or systemic changes noted continues to have neuropathy affecting both feet digital contractures HAV deformity no open wounds or ulcers of the current time.     Objective:   Physical Exam Vascular status is intact DP plus one PT thready at plus one over 4 bilateral capillary refill time 3 seconds all digits epicritic and proprioceptive sensations diminished on Semmes Weinstein the forefoot arch and the heel there is normal sensation dorsum of the foot there is normal plantar response and DTRs not listed no open wounds no ulcers no secondary infections. Nails thick brittle [place] friable dystrophic with discoloration 1 through 5 bilateral. They're rigid digital contractures and notable HAV deformity history of keratoses although not active patient wearing appropriate accommodative shoes preventing keratoses and complications. Need replacement of his diabetic shoes and insoles at this time.       Assessment & Plan:  Assessment diabetes with history peripheral neuropathy and angiopathy diminished neurovascular status are noted with decreased sensation a week and pulses thready brittle pulses DP and PT bilateral. Mild +1 edema noted mild varicosities noted. No active wounds or ulcers however thick brittle dystrophic friable mycotic nails debrided 1 through 5 bilateral at this time continued palliative care in the future as needed recommended 3 month follow-up for debridement prescription for diabetic extra-depth shoes and custom molded 3 custom molded dual density Plastizote inlays are recommended at this time prescription is issued will get the prescription for shoes  filled at [facility] in [place].  Harriet Masson DPM

## 2014-03-13 ENCOUNTER — Telehealth: Payer: Self-pay | Admitting: Cardiovascular Disease

## 2014-03-13 MED ORDER — FUROSEMIDE 20 MG PO TABS: ORAL_TABLET | ORAL | Status: DC

## 2014-03-13 NOTE — Telephone Encounter (Signed)
Spoke with pharmacist at [facility]. Patient needs lasix refill. Our med list is 20mg  tablet - take 2 tablets QAM and 2 tablet QHS. Authorized refill via phone

## 2014-04-16 ENCOUNTER — Ambulatory Visit (INDEPENDENT_AMBULATORY_CARE_PROVIDER_SITE_OTHER): Payer: Medicare Other | Admitting: Otolaryngology

## 2014-04-16 ENCOUNTER — Encounter (HOSPITAL_COMMUNITY): Payer: Medicare Other

## 2014-04-16 DIAGNOSIS — H903 Sensorineural hearing loss, bilateral: Secondary | ICD-10-CM

## 2014-04-20 ENCOUNTER — Encounter (HOSPITAL_COMMUNITY): Payer: Medicare Other

## 2014-04-20 ENCOUNTER — Ambulatory Visit (HOSPITAL_COMMUNITY)
Admission: RE | Admit: 2014-04-20 | Discharge: 2014-04-20 | Disposition: A | Discharge status: Home / Self Care | Payer: Medicare Other | Source: Ambulatory Visit | Attending: Cardiovascular Disease | Admitting: Cardiovascular Disease

## 2014-04-20 DIAGNOSIS — I6521 Occlusion and stenosis of right carotid artery: Secondary | ICD-10-CM

## 2014-04-20 DIAGNOSIS — I6529 Occlusion and stenosis of unspecified carotid artery: Secondary | ICD-10-CM

## 2014-04-20 DIAGNOSIS — I779 Disorder of arteries and arterioles, unspecified: Secondary | ICD-10-CM

## 2014-04-20 NOTE — Progress Notes (Signed)
Carotid Duplex Completed. Stable right ICA 50-69% stenosis.  Daryon Remmert, BS, RDMS, RVT  

## 2014-05-05 ENCOUNTER — Telehealth: Payer: Self-pay | Admitting: *Deleted

## 2014-05-05 DIAGNOSIS — I6523 Occlusion and stenosis of bilateral carotid arteries: Secondary | ICD-10-CM

## 2014-05-05 NOTE — Telephone Encounter (Signed)
-----   Message from Lorretta Harp, MD sent at 04/28/2014  4:05 PM EST ----- No change from prior study. Repeat in 12 months.

## 2014-05-05 NOTE — Telephone Encounter (Signed)
Carotid doppler results called.  Voiced understanding.  Order placed for yearly recheck.

## 2014-05-11 ENCOUNTER — Other Ambulatory Visit: Payer: Self-pay | Admitting: Cardiovascular Disease

## 2014-05-20 ENCOUNTER — Ambulatory Visit (INDEPENDENT_AMBULATORY_CARE_PROVIDER_SITE_OTHER): Payer: Medicare Other | Admitting: Cardiovascular Disease

## 2014-05-20 ENCOUNTER — Telehealth: Payer: Self-pay | Admitting: Cardiovascular Disease

## 2014-05-20 ENCOUNTER — Encounter: Payer: Self-pay | Admitting: Cardiovascular Disease

## 2014-05-20 VITALS — BP 124/60 | HR 66 | Ht 71.5 in | Wt 286.2 lb

## 2014-05-20 DIAGNOSIS — I739 Peripheral vascular disease, unspecified: Secondary | ICD-10-CM

## 2014-05-20 DIAGNOSIS — R0602 Shortness of breath: Secondary | ICD-10-CM

## 2014-05-20 DIAGNOSIS — I6529 Occlusion and stenosis of unspecified carotid artery: Secondary | ICD-10-CM

## 2014-05-20 DIAGNOSIS — I10 Essential (primary) hypertension: Secondary | ICD-10-CM | POA: Diagnosis not present

## 2014-05-20 DIAGNOSIS — R0600 Dyspnea, unspecified: Secondary | ICD-10-CM | POA: Diagnosis not present

## 2014-05-20 DIAGNOSIS — E785 Hyperlipidemia, unspecified: Secondary | ICD-10-CM | POA: Diagnosis not present

## 2014-05-20 DIAGNOSIS — I2583 Coronary atherosclerosis due to lipid rich plaque: Secondary | ICD-10-CM

## 2014-05-20 DIAGNOSIS — I2510 Atherosclerotic heart disease of native coronary artery without angina pectoris: Secondary | ICD-10-CM

## 2014-05-20 MED ORDER — RANOLAZINE ER 500 MG PO TB12: 500.0000 mg | ORAL_TABLET | Freq: Two times a day (BID) | ORAL | Status: DC

## 2014-05-20 MED ORDER — ATORVASTATIN CALCIUM 40 MG PO TABS: 40.0000 mg | ORAL_TABLET | Freq: Every day | ORAL | Status: DC

## 2014-05-20 NOTE — Assessment & Plan Note (Signed)
History of vertebral artery stenosis status post stenting in 1998 by Dr. Leitha Schuller in [place] prior to coronary artery bypass grafting. His last carotid Doppler study performed 04/18/14 revealed moderate right ICA stenosis. He is neurologically a symptomatic.

## 2014-05-20 NOTE — Telephone Encounter (Addendum)
Curt Bears booked same-day for this pt, I called him to inform of time, he voiced understanding.

## 2014-05-20 NOTE — Assessment & Plan Note (Signed)
History of CAD status post coronary artery bypass grafting in 1998 with a LIMA to the LAD, vein to the circumflex obtuse marginal branch and to the right coronary artery. Quite a catheterization performed 12/19/12 revealed anatomy unchanged from his cath in 2008 which was notable for an occluded vein to obtuse marginal branch. His EF is preserved in the 55% range. He complains of increasing dyspnea on exertion and pain across his shoulders relieved with sublingual nitroglycerin. He is on amlodipine and long-acting oral nitrates. I'm going to bed and begin low-dose Ranexa and change his simvastatin to Lipitor because of the interaction.

## 2014-05-20 NOTE — Telephone Encounter (Signed)
Mr. Worley is calling because she state he has had some shortness of breath , and hurting in his shoulders and having to take his nitro . Please call    Thanks

## 2014-05-20 NOTE — Patient Instructions (Signed)
Your physician wants you to follow-up in: 3 months with Dr Gwenlyn Found. You will receive a reminder letter in the mail two months in advance. If you don't receive a letter, please call our office to schedule the follow-up appointment.  Stop simvastatin.  Start atorvastatin 40mg  daily  Start Ranexa 500mg  twice a day.

## 2014-05-20 NOTE — Progress Notes (Signed)
05/20/2014 [name]   April 02, 1933  [id number]  Primary Physician Alonza Bogus, MD Primary Cardiologist: Lorretta Harp MD Renae Gloss   HPI:   The patient is 81 year old moderately overweight married Caucasian male father of 1 child (1 deceased), grandfather to 51 stepchild who is accompanied by his wife and daughter today. I last saw him in the office 11/11/13.Marland Kitchen He has a history of CAD and PVOD. He had coronary artery bypass grafting in March 1998 with a LIMA to his LAD, a vein to the circumflex obtuse marginal branch, a vein to the right coronary artery. He also had left vertebral artery stenting by Dr. Leitha Schuller in [place] after his surgery in May 1998. His other problems include hypertension, hyperlipidemia, non-insulin-dependent diabetes and COPD. He has moderate renal insufficiency and was taken off ACE. He has obstructive sleep apnea on CPAP and is compliant. Cath performed May 29, 2006 revealed a patent LIMA to the LAD, 90% apical LAD lesion, total native circumflex and RCA with occluded vein to the OM branch, and a patent vein to the distal right. His vertebral stent was widely patent at that time. His EF was 55% with mild inferobasal hypokinesia April 2013. His last Myoview performed March 2014 revealed mild inferolateral ischemia explainable by his anatomy. It was a low risk study. Dr. Luan Pulling follows his lipid profile. He is in the office today complaining of exertional bilateral shoulder pain and SOB. He says when ever he walks to get the mail or takes the trash to the street he get SOB and has pain across his shoulders. He has to stop and rest. His symptoms seems to be increasing in severity and take longer to relieve with rest. A Myoview stress test was performed several days ago that showed inferolateral scar with moderate peri-infarct ischemia. The patient's story was compelling for angina/anginal equivalent. Based on this I proceeded with cardiac  catheterization 12/19/12 revealing anatomy similar to his prior cath in 08 including a patent stent to his left vertebral artery.since I saw him last 6 months ago he's had progressive dyspnea, fatigue and shoulder pain responsive to sublingual nitroglycerin.  Current Outpatient Prescriptions  Medication Sig Dispense Refill  . acarbose (PRECOSE) 100 MG tablet Take 100 mg by mouth 3 (three) times daily with meals.    Marland Kitchen allopurinol (ZYLOPRIM) 300 MG tablet Take 150 mg by mouth at bedtime.     Marland Kitchen amLODipine (NORVASC) 10 MG tablet TAKE 1 TABLET BY MOUTH ONCE A DAY. 90 tablet 2  . aspirin EC 81 MG tablet Take 81 mg by mouth every morning.     . Cholecalciferol (VITAMIN D3) 1000 UNITS CAPS Take 1 capsule by mouth 2 (two) times daily.     . clopidogrel (PLAVIX) 75 MG tablet TAKE ONE TABLET BY MOUTH ONCE DAILY. 90 tablet 2  . furosemide (LASIX) 40 MG tablet Take 1 tablet by mouth 2 (two) times daily.    Marland Kitchen gabapentin (NEURONTIN) 300 MG capsule Take 300 mg by mouth 4 (four) times daily as needed (takes three times daily but may take one in between if needed).    Marland Kitchen glipiZIDE (GLUCOTROL) 10 MG tablet Take 10 mg by mouth 2 (two) times daily.     Marland Kitchen HYDROcodone-acetaminophen (NORCO) 10-325 MG per tablet Take 1 tablet by mouth 4 (four) times daily as needed for pain.    Marland Kitchen insulin glargine (LANTUS) 100 UNIT/ML injection Inject 60 Units into the skin 2 (two) times daily.     Marland Kitchen  isosorbide dinitrate (ISORDIL) 20 MG tablet Take 1 tablet (20 mg total) by mouth 3 (three) times daily. 90 tablet 10  . levothyroxine (SYNTHROID, LEVOTHROID) 150 MCG tablet Take 150 mcg by mouth daily before breakfast.    . mometasone (ELOCON) 0.1 % ointment Apply 1 application topically as directed.     . nitroGLYCERIN (NITROSTAT) 0.4 MG SL tablet Place 1 tablet (0.4 mg total) under the tongue every 5 (five) minutes as needed for chest pain. 25 tablet 2  . omeprazole (PRILOSEC) 20 MG capsule Take 20 mg by mouth at bedtime.     . [name]  (JANUVIA) 100 MG tablet Take 50 mg by mouth 2 (two) times daily.     . Tiotropium Bromide Monohydrate (SPIRIVA HANDIHALER IN) Inhale 1 capsule into the lungs every morning.     . triamcinolone cream (KENALOG) 0.1 % Apply 1 application topically daily as needed (for irritation).     Marland Kitchen atorvastatin (LIPITOR) 40 MG tablet Take 1 tablet (40 mg total) by mouth daily. 90 tablet 3  . ranolazine (RANEXA) 500 MG 12 hr tablet Take 1 tablet (500 mg total) by mouth 2 (two) times daily. 60 tablet 6   No current facility-administered medications for this visit.    Allergies  Allergen Reactions  . Demerol Nausea And Vomiting    History   Social History  . Marital Status: Married    Spouse Name: N/A  . Number of Children: N/A  . Years of Education: N/A   Occupational History  . retired    Social History Main Topics  . Smoking status: Former Smoker -- 3.50 packs/day for 40 years    Types: Cigarettes    Quit date: 12/09/1985  . Smokeless tobacco: Former Systems developer    Types: Chew     Comment: 12/17/2012 "quit chewing tobacco in 1998"  . Alcohol Use: Yes     Comment: 12/17/2012 "1-2 beers/wk" - socially   . Drug Use: No  . Sexual Activity: No   Other Topics Concern  . Not on file   Social History Narrative     Review of Systems: General: negative for chills, fever, night sweats or weight changes.  Cardiovascular: negative for chest pain, dyspnea on exertion, edema, orthopnea, palpitations, paroxysmal nocturnal dyspnea or shortness of breath Dermatological: negative for rash Respiratory: negative for cough or wheezing Urologic: negative for hematuria Abdominal: negative for nausea, vomiting, diarrhea, bright red blood per rectum, melena, or hematemesis Neurologic: negative for visual changes, syncope, or dizziness All other systems reviewed and are otherwise negative except as noted above.    Blood pressure 124/60, pulse 66, height 5' 11.5" (1.816 m), weight 286 lb 3.2 oz (129.819 kg),  SpO2 92 %.  General appearance: alert and no distress Neck: no adenopathy, no carotid bruit, no JVD, supple, symmetrical, trachea midline and thyroid not enlarged, symmetric, no tenderness/mass/nodules Lungs: clear to auscultation bilaterally Heart: regular rate and rhythm, S1, S2 normal, no murmur, click, rub or gallop Extremities: extremities normal, atraumatic, no cyanosis or edema  EKG normal sinus rhythm at 66 with nonspecific ST and T-wave changes. I personally reviewed this EKG  ASSESSMENT AND PLAN:   PVD, Lt Vertebral artery PTA in '08, known moderate ICA disease History of vertebral artery stenosis status post stenting in 1998 by Dr. Leitha Schuller in [place] prior to coronary artery bypass grafting. His last carotid Doppler study performed 04/18/14 revealed moderate right ICA stenosis. He is neurologically a symptomatic.   Hyperlipidemia History of hyperlipidemia on simvastatin 80 mg  a day followed by his PCP   HTN (hypertension) History of hypertension blood pressure measured at 124/60 on amlodipine. Continue current meds at current dosing   CAD, CABG '98, cath '08, low risk Myoview Feb 2012, stable cath 12/19/12 with chronically occluded VG to LCX, patent LIMA-LAD & VG to dominant rt.  History of CAD status post coronary artery bypass grafting in 1998 with a LIMA to the LAD, vein to the circumflex obtuse marginal branch and to the right coronary artery. Quite a catheterization performed 12/19/12 revealed anatomy unchanged from his cath in 2008 which was notable for an occluded vein to obtuse marginal branch. His EF is preserved in the 55% range. He complains of increasing dyspnea on exertion and pain across his shoulders relieved with sublingual nitroglycerin. He is on amlodipine and long-acting oral nitrates. I'm going to bed and begin low-dose Ranexa and change his simvastatin to Lipitor because of the interaction.       Lorretta Harp MD FACP,FACC,FAHA,  [name] 05/20/2014 2:20 PM

## 2014-05-20 NOTE — Telephone Encounter (Signed)
Pt states dyspnea on exertion x 3-4 days Gets fatigued w/ walking to mailbox (~75 ft from house), has to stop and rest on way down and way back.  Has shoulder soreness w/ this. He states relief w/ nitro. Has had this occurrence daily.  No SOB on rest, though he does state some UR/nasal congestion 1 day ago for which he did a breathing treatment early AM. This alleviated symptoms.  Pt concerned something going on, would like to be eval'd. Has seen Lurena Joiner in the past.  Will route & talk w/ Curt Bears to see how to proceed.

## 2014-05-20 NOTE — Assessment & Plan Note (Signed)
History of hyperlipidemia on simvastatin 80 mg a day followed by his PCP

## 2014-05-20 NOTE — Assessment & Plan Note (Signed)
History of hypertension blood pressure measured at 124/60 on amlodipine. Continue current meds at current dosing

## 2014-05-21 ENCOUNTER — Encounter: Payer: Self-pay | Admitting: Internal Medicine

## 2014-06-09 ENCOUNTER — Ambulatory Visit (INDEPENDENT_AMBULATORY_CARE_PROVIDER_SITE_OTHER): Payer: Medicare Other | Admitting: Podiatry

## 2014-06-09 DIAGNOSIS — B351 Tinea unguium: Secondary | ICD-10-CM

## 2014-06-09 DIAGNOSIS — M79673 Pain in unspecified foot: Secondary | ICD-10-CM | POA: Diagnosis not present

## 2014-06-09 NOTE — Progress Notes (Signed)
Subjective:     Patient ID: [name], male   DOB: [birth year], 81 y.o.   MRN: [id number]  HPI Presents today chief complaint of painful elongated toenails.  Objective: Pulses are palpable bilateral nails are thick, yellow dystrophic onychomycosis and painful palpation.   Assessment: Onychomycosis with pain in limb.  Plan: Treatment of nails in thickness and length as covered service secondary to pain.   Review of Systems     Objective:   Physical Exam     Assessment:     Pain in limb secondary to onychomycosis    Plan:     Debrided nails 1 through 5 bilateral.

## 2014-06-16 ENCOUNTER — Telehealth: Payer: Self-pay

## 2014-06-16 NOTE — Telephone Encounter (Signed)
pts wife called, pt received a letter in the mail saying it was time for a follow up visit. Pt said he was doing fine right now and didn't need a follow up visit and if he has any problems he will call and schedule an appt.

## 2014-06-17 NOTE — Telephone Encounter (Signed)
Please nic

## 2014-06-17 NOTE — Telephone Encounter (Signed)
Ok. Let's have him return in 6 months.

## 2014-06-17 NOTE — Telephone Encounter (Signed)
ON RECALL LIST  °

## 2014-06-19 ENCOUNTER — Telehealth: Payer: Self-pay | Admitting: *Deleted

## 2014-06-19 NOTE — Telephone Encounter (Signed)
Faxed order for  CPAP machine and supplies to [facility]. 

## 2014-07-08 ENCOUNTER — Telehealth: Payer: Self-pay | Admitting: *Deleted

## 2014-07-08 ENCOUNTER — Telehealth: Payer: Self-pay | Admitting: Cardiovascular Disease

## 2014-07-08 NOTE — Telephone Encounter (Signed)
I spoke with patient.  He is not having any chest pain or any new symptoms.  Someone commented that his color was a little grey and that has him concerned.  He stopped his Ranexa recently due to some dizziness, but does admit to feeling better while taking it.  I advised him to resume it.  He only wants to resume it at 500mg  daily.  I offered him an appt today, but he only wants to see Dr Gwenlyn Found.  I made patient an appt with Dr Gwenlyn Found next week.

## 2014-07-08 NOTE — Telephone Encounter (Signed)
Mr.Barillas is calling because he is having problems with some of his medication.. Please call    Thanks

## 2014-07-08 NOTE — Telephone Encounter (Signed)
Pt. Has called back and wanted to talk to [facility] or Dr. Gwenlyn Found about his medication. I informed them that [name] cannot talk to them today and they need to see the PCP and find out what he thanks and go ahead and make an appt. To See Dr. Gwenlyn Found

## 2014-07-08 NOTE — Telephone Encounter (Signed)
[name] (daughter) is calling in reference to Dr.Berry put her father on Ranexa and maybe 3wks ago he had a episode where he got dizzy and the bp got low and he stop taking the medication. He has a gray color to him. Please call [name] at [phone]   Thanks

## 2014-07-08 NOTE — Telephone Encounter (Signed)
Pt's daughter called in and stated  He dad stopped taking Ranexa 3 weeks's ago after he had a dizzy spell , and and is not doing well . He has been weak and tired all the time . I have instructed his daughter to have her PCP see him and if he thinks we need to see him to make an appt., daughter agreed with plan

## 2014-07-08 NOTE — Telephone Encounter (Signed)
-----   Message from Dolan Amen, LPN sent at 7/42/5956  9:20 AM EDT ----- Pt. Wants you to call him about his meds and his situation

## 2014-07-09 ENCOUNTER — Telehealth: Payer: Self-pay | Admitting: Cardiovascular Disease

## 2014-07-09 ENCOUNTER — Encounter: Payer: Self-pay | Admitting: Cardiovascular Disease

## 2014-07-09 NOTE — Telephone Encounter (Signed)
Received records from [facility] for appointment on 07/17/14 with Dr. Gwenlyn Found.  Records given to [facility] (medical records) for Dr Kennon Holter schedule on 07/17/14. lp

## 2014-07-17 ENCOUNTER — Encounter: Payer: Self-pay | Admitting: Cardiovascular Disease

## 2014-07-17 ENCOUNTER — Ambulatory Visit (INDEPENDENT_AMBULATORY_CARE_PROVIDER_SITE_OTHER): Payer: Medicare Other | Admitting: Cardiovascular Disease

## 2014-07-17 VITALS — BP 125/45 | HR 62 | Ht 71.5 in | Wt 288.7 lb

## 2014-07-17 DIAGNOSIS — I2510 Atherosclerotic heart disease of native coronary artery without angina pectoris: Secondary | ICD-10-CM | POA: Diagnosis not present

## 2014-07-17 DIAGNOSIS — I6529 Occlusion and stenosis of unspecified carotid artery: Secondary | ICD-10-CM | POA: Diagnosis not present

## 2014-07-17 DIAGNOSIS — I739 Peripheral vascular disease, unspecified: Secondary | ICD-10-CM

## 2014-07-17 DIAGNOSIS — G4730 Sleep apnea, unspecified: Secondary | ICD-10-CM

## 2014-07-17 DIAGNOSIS — I10 Essential (primary) hypertension: Secondary | ICD-10-CM | POA: Diagnosis not present

## 2014-07-17 DIAGNOSIS — R001 Bradycardia, unspecified: Secondary | ICD-10-CM

## 2014-07-17 DIAGNOSIS — I2583 Coronary atherosclerosis due to lipid rich plaque: Secondary | ICD-10-CM

## 2014-07-17 DIAGNOSIS — E785 Hyperlipidemia, unspecified: Secondary | ICD-10-CM

## 2014-07-17 NOTE — Assessment & Plan Note (Signed)
History of coronary artery disease status post coronary artery bypass grafting in 1998 with a LIMA to his LAD, vein to the circumflex obtuse marginal branch and to the right coronary artery. He was catheterized 12/19/12 revealing anatomy unchanged from his cath in 2008. It was notable for an occluded vein to the obtuse marginal branch. EF was 55%. He is on oral nitrates and amlodipine. I did add Ranexa which she was intolerant to. He has exertional angina which she takes "wisdom 4 as well as dyspnea on exertion.

## 2014-07-17 NOTE — Assessment & Plan Note (Signed)
History of hypertension blood pressure measurements weight 125/45. He is on amlodipine. Continue current meds at current dosing

## 2014-07-17 NOTE — Progress Notes (Signed)
07/17/2014 [name]   1933-11-16  [id number]  Primary Physician Alonza Bogus, MD Primary Cardiologist: Lorretta Harp MD Renae Gloss   HPI:   The patient is 81 year old moderately overweight married Caucasian male father of 1 child (1 deceased), grandfather to 57 stepchild who is accompanied by his wife and daughter today. I last saw him in the office 05/20/14.Marland Kitchen He has a history of CAD and PVOD. He had coronary artery bypass grafting in March 1998 with a LIMA to his LAD, a vein to the circumflex obtuse marginal branch, a vein to the right coronary artery. He also had left vertebral artery stenting by Dr. Leitha Schuller in [place] after his surgery in May 1998. His other problems include hypertension, hyperlipidemia, non-insulin-dependent diabetes and COPD. He has moderate renal insufficiency and was taken off ACE. He has obstructive sleep apnea on CPAP and is compliant. Cath performed May 29, 2006 revealed a patent LIMA to the LAD, 90% apical LAD lesion, total native circumflex and RCA with occluded vein to the OM branch, and a patent vein to the distal right. His vertebral stent was widely patent at that time. His EF was 55% with mild inferobasal hypokinesia April 2013. His last Myoview performed March 2014 revealed mild inferolateral ischemia explainable by his anatomy. It was a low risk study. Dr. Luan Pulling follows his lipid profile. He is in the office today complaining of exertional bilateral shoulder pain and SOB. He says when ever he walks to get the mail or takes the trash to the street he get SOB and has pain across his shoulders. He has to stop and rest. His symptoms seems to be increasing in severity and take longer to relieve with rest. A Myoview stress test was performed several days ago that showed inferolateral scar with moderate peri-infarct ischemia. The patient's story was compelling for angina/anginal equivalent. Based on this I proceeded with cardiac  catheterization 12/19/12 revealing anatomy similar to his prior cath in 08 including a patent stent to his left vertebral artery.since I saw him last 6 months ago he's had progressive dyspnea, fatigue and shoulder pain responsive to sublingual nitroglycerin. I began him on Ranexa at his last office visit which she took for several weeks but was intolerant to ultimately. He does complain of nitroglycerin responsive angina and shortness of breath. His weight is up at least 10 pounds since October of last year.    Current Outpatient Prescriptions  Medication Sig Dispense Refill  . acarbose (PRECOSE) 100 MG tablet Take 100 mg by mouth 3 (three) times daily with meals.    Marland Kitchen allopurinol (ZYLOPRIM) 300 MG tablet Take 150 mg by mouth at bedtime.     Marland Kitchen amLODipine (NORVASC) 10 MG tablet TAKE 1 TABLET BY MOUTH ONCE A DAY. 90 tablet 2  . aspirin EC 81 MG tablet Take 81 mg by mouth every morning.     Marland Kitchen atorvastatin (LIPITOR) 40 MG tablet Take 1 tablet (40 mg total) by mouth daily. 90 tablet 3  . Cholecalciferol (VITAMIN D3) 1000 UNITS CAPS Take 1 capsule by mouth 2 (two) times daily.     . clopidogrel (PLAVIX) 75 MG tablet TAKE ONE TABLET BY MOUTH ONCE DAILY. 90 tablet 2  . furosemide (LASIX) 40 MG tablet Take 1 tablet by mouth 2 (two) times daily.    Marland Kitchen gabapentin (NEURONTIN) 300 MG capsule Take 300 mg by mouth 4 (four) times daily as needed (takes three times daily but may take one in between if needed).    Marland Kitchen  glipiZIDE (GLUCOTROL) 10 MG tablet Take 10 mg by mouth 2 (two) times daily.     Marland Kitchen HYDROcodone-acetaminophen (NORCO) 10-325 MG per tablet Take 1 tablet by mouth 4 (four) times daily as needed for pain.    Marland Kitchen insulin glargine (LANTUS) 100 UNIT/ML injection Inject 60 Units into the skin 2 (two) times daily.     . isosorbide dinitrate (ISORDIL) 20 MG tablet Take 1 tablet (20 mg total) by mouth 3 (three) times daily. 90 tablet 10  . levothyroxine (SYNTHROID, LEVOTHROID) 150 MCG tablet Take 150 mcg by mouth  daily before breakfast.    . mometasone (ELOCON) 0.1 % ointment Apply 1 application topically as directed.     . nitroGLYCERIN (NITROSTAT) 0.4 MG SL tablet Place 1 tablet (0.4 mg total) under the tongue every 5 (five) minutes as needed for chest pain. 25 tablet 2  . omeprazole (PRILOSEC) 20 MG capsule Take 20 mg by mouth at bedtime.     . [name] (JANUVIA) 100 MG tablet Take 50 mg by mouth 2 (two) times daily.     . Tiotropium Bromide Monohydrate (SPIRIVA HANDIHALER IN) Inhale 1 capsule into the lungs every morning.     . triamcinolone cream (KENALOG) 0.1 % Apply 1 application topically daily as needed (for irritation).      No current facility-administered medications for this visit.    Allergies  Allergen Reactions  . Demerol Nausea And Vomiting  . Ranexa [Ranolazine] Nausea Only    dizziness    History   Social History  . Marital Status: Married    Spouse Name: N/A  . Number of Children: N/A  . Years of Education: N/A   Occupational History  . retired    Social History Main Topics  . Smoking status: Former Smoker -- 3.50 packs/day for 40 years    Types: Cigarettes    Quit date: 12/09/1985  . Smokeless tobacco: Former Systems developer    Types: Chew     Comment: 12/17/2012 "quit chewing tobacco in 1998"  . Alcohol Use: Yes     Comment: 12/17/2012 "1-2 beers/wk" - socially   . Drug Use: No  . Sexual Activity: No   Other Topics Concern  . Not on file   Social History Narrative     Review of Systems: General: negative for chills, fever, night sweats or weight changes.  Cardiovascular: negative for chest pain, dyspnea on exertion, edema, orthopnea, palpitations, paroxysmal nocturnal dyspnea or shortness of breath Dermatological: negative for rash Respiratory: negative for cough or wheezing Urologic: negative for hematuria Abdominal: negative for nausea, vomiting, diarrhea, bright red blood per rectum, melena, or hematemesis Neurologic: negative for visual changes, syncope,  or dizziness All other systems reviewed and are otherwise negative except as noted above.    Blood pressure 125/45, pulse 62, height 5' 11.5" (1.816 m), weight 288 lb 11.2 oz (130.953 kg).  General appearance: alert and no distress Neck: no adenopathy, no carotid bruit, no JVD, supple, symmetrical, trachea midline and thyroid not enlarged, symmetric, no tenderness/mass/nodules Lungs: clear to auscultation bilaterally Heart: regular rate and rhythm, S1, S2 normal, no murmur, click, rub or gallop Extremities: trace  bilateral lower extremity edema  EKG normal sinus rhythm at 61 without ST or T-wave changes. I personally reviewed this EKG  ASSESSMENT AND PLAN:   Sleep apnea, on C-pap History of obstructive sleep apnea on sleep Pap which he benefits from   PVD, Lt Vertebral artery PTA in '08, known moderate ICA disease History of left vertebral artery stenting  by Dr. Leitha Schuller in [place] in 1998 prior to coronary artery bypass grafting. Carotid Dopplers performed 04/18/14 revealed moderate right ICA stenosis with patent vertebral artery. He is asymptomatic.   Hyperlipidemia History of hyperlipidemia on atorvastatin 40 mg a day followed by his PCP   HTN (hypertension) History of hypertension blood pressure measurements weight 125/45. He is on amlodipine. Continue current meds at current dosing   CAD, CABG '98, cath '08, low risk Myoview Feb 2012, stable cath 12/19/12 with chronically occluded VG to LCX, patent LIMA-LAD & VG to dominant rt.  History of coronary artery disease status post coronary artery bypass grafting in 1998 with a LIMA to his LAD, vein to the circumflex obtuse marginal branch and to the right coronary artery. He was catheterized 12/19/12 revealing anatomy unchanged from his cath in 2008. It was notable for an occluded vein to the obtuse marginal branch. EF was 55%. He is on oral nitrates and amlodipine. I did add Ranexa which she was intolerant to. He has exertional  angina which she takes "wisdom 4 as well as dyspnea on exertion.       Lorretta Harp MD FACP,FACC,FAHA, [facility] 07/17/2014 9:27 AM

## 2014-07-17 NOTE — Assessment & Plan Note (Signed)
History of hyperlipidemia on atorvastatin 40 mg a day followed by his PCP 

## 2014-07-17 NOTE — Assessment & Plan Note (Signed)
History of obstructive sleep apnea on sleep Pap which he benefits from

## 2014-07-17 NOTE — Patient Instructions (Signed)
Your phyisician recommends that you schedule a follow-up appointment in 6 months with an extender - Kerin Ransom, PA-C.  Dr Gwenlyn Found wants you to follow-up in 12 months. You will receive a reminder letter in the mail two months in advance. If you don't receive a letter, please call our office to schedule the follow-up appointment.

## 2014-07-17 NOTE — Assessment & Plan Note (Addendum)
History of left vertebral artery stenting by Dr. Leitha Schuller in [place] in 1998 prior to coronary artery bypass grafting. Carotid Dopplers performed 04/18/14 revealed moderate right ICA stenosis with patent vertebral artery. He is asymptomatic.

## 2014-08-21 ENCOUNTER — Ambulatory Visit: Payer: Medicare Other | Admitting: Cardiovascular Disease

## 2014-09-01 ENCOUNTER — Encounter: Payer: Self-pay | Admitting: Podiatry

## 2014-09-01 ENCOUNTER — Ambulatory Visit (INDEPENDENT_AMBULATORY_CARE_PROVIDER_SITE_OTHER): Payer: Medicare Other | Admitting: Podiatry

## 2014-09-01 ENCOUNTER — Ambulatory Visit: Payer: Medicare Other

## 2014-09-01 DIAGNOSIS — E1140 Type 2 diabetes mellitus with diabetic neuropathy, unspecified: Secondary | ICD-10-CM

## 2014-09-01 DIAGNOSIS — B351 Tinea unguium: Secondary | ICD-10-CM

## 2014-09-01 DIAGNOSIS — M79673 Pain in unspecified foot: Secondary | ICD-10-CM | POA: Diagnosis not present

## 2014-09-01 LAB — HM DIABETES FOOT EXAM

## 2014-09-01 NOTE — Progress Notes (Signed)
Patient presents to the office today with a chief complaint of painful elongated toenails.  Objective: Pulses are palpable bilateral. Nails are thick yellow dystrophic clinically mycotic and painful palpation.  Assessment: Pain in limb secondary to onychomycosis 1 through 5 bilateral.  Plan: Debridement of nails 1 through 5 bilateral covered service secondary to pain.  

## 2014-09-02 ENCOUNTER — Ambulatory Visit: Payer: Medicare Other | Admitting: Podiatry

## 2014-10-22 ENCOUNTER — Ambulatory Visit (HOSPITAL_COMMUNITY): Payer: Medicare Other | Attending: Neurosurgery | Admitting: Physical Therapy

## 2014-10-22 DIAGNOSIS — M545 Low back pain, unspecified: Secondary | ICD-10-CM

## 2014-10-22 DIAGNOSIS — R29898 Other symptoms and signs involving the musculoskeletal system: Secondary | ICD-10-CM

## 2014-10-22 DIAGNOSIS — Z7409 Other reduced mobility: Secondary | ICD-10-CM | POA: Diagnosis present

## 2014-10-22 DIAGNOSIS — Z9181 History of falling: Secondary | ICD-10-CM | POA: Insufficient documentation

## 2014-10-22 NOTE — Therapy (Signed)
[facility] [address] [place], [place], [address] Phone: [phone]   Fax:  [phone]  Physical Therapy Evaluation  Patient Details  Name: [name] MRN: [id number] Date of Birth: 1933/09/07 Referring Provider:  Erline Levine, MD  Encounter Date: 10/22/2014      PT End of Session - 10/22/14 1255    Visit Number 1   Number of Visits 10   Date for PT Re-Evaluation 11/20/14   Authorization Type Medicare   Authorization - Visit Number 1   Authorization - Number of Visits 10   PT Start Time 0930   PT Stop Time 1015   PT Time Calculation (min) 45 min   Equipment Utilized During Treatment Gait belt   Activity Tolerance Patient tolerated treatment well   Behavior During Therapy [facility] for tasks assessed/performed      Past Medical History  Diagnosis Date  . Diabetes mellitus type II   . Hypothyroidism   . Hypertension   . GERD (gastroesophageal reflux disease)   . Angina   . CHF (congestive heart failure)     diastolic  . Heart attack   . COPD (chronic obstructive pulmonary disease)   . Coronary artery disease     CABG '98, cath '08, low risk Nuc 3/14  . Obesity   . PVD (peripheral vascular disease)     Lt vertebral stent '98  . Diastolic dysfunction     grade 1 with an EF of 60-65% 4/13  . OSA on CPAP   . High cholesterol   . Chronic bronchitis     "usually get it q yr" (12/17/2012)  . Exertional shortness of breath   . Arthritis     "knees, hips" (12/17/2012)  . Gout     "ankles, legs" (12/17/2012)  . Pneumonia 05/2011  . CAD, CABG '98, cath '08, low risk Myoview Feb 2012, stable cath 12/19/12 with chronically occluded VG to LCX, patent LIMA-LAD & VG to dominant rt.  06/01/2011    Patent LIMA-LAD, oclluded SVG-CFX, patent SVG-RCA in 2008   . Bradycardia, to 30 12/20/2012  . Nephrolithiasis   . CKD (chronic kidney disease) stage 3, GFR 30-59 ml/min 12/20/2012    Past Surgical History  Procedure Laterality  Date  . Back surgery    . Replantation finger Right 2000    "little finger" (12/17/2012)  . Cholecystectomy    . Tonsillectomy  ?1960's  . Knee arthroscopy Left 1990's  . Lumbar laminectomy/decompression microdiscectomy  04/11/2011    Procedure: LUMBAR LAMINECTOMY/DECOMPRESSION MICRODISCECTOMY 1 LEVEL;  Surgeon: Peggyann Shoals, MD;  Location: [place] [facility]URO OR[facility];  Service: Neurosurgery;  Laterality: Right;  RIGHT Lumbar Three-Four laminectomy with resection of synovial cyst  . Angioplasty  '99, 00, March 06  . Tibia fracture surgery Right ~ 2012  . Coronary artery bypass graft  04/1996    "CABG X6" (12/17/2012)  . Cataract extraction w/ intraocular lens  implant, bilateral    . Lithotripsy      "once" (12/17/2012)  . Cardiac catheterization  April 2006; 12/19/2012  . Coronary angioplasty with stent placement      "I've had a total of 3-4 stents put in" (12/17/2012)  . Fracture surgery    . Colonoscopy  08/09/2004    KGU:[id number] diverticula.  Remainder of colonic mucosa appeared normal/normal rectum  . Vertiberal artery stent      1998, 99% blocked.  . Colonoscopy N/A 07/31/2013    BJS:EGBTDV colonic polyp-removed as described above. Status post  segmental biopsy to evaluate for microscopic colitis. bx tubular adenoma, asc colon folcal active colitis/lyphoid aggregates (nonspecific)  . Left heart catheterization with coronary angiogram N/A 12/19/2012    Procedure: LEFT HEART CATHETERIZATION WITH CORONARY ANGIOGRAM;  Surgeon: Lorretta Harp, MD;  Location: [facility] CATH LAB;  Service: Cardiovascular;  Laterality: N/A;    There were no vitals filed for this visit.  Visit Diagnosis:  Risk for falls  Bilateral low back pain without sciatica  Weakness of both legs  Impaired functional mobility and activity tolerance      Subjective Assessment - 10/22/14 0935    Subjective Pt reports that he has been having LBP for several years, but over the past few months, it has been getting worse. He reports  that he has been having weakness in his back and legs for some time now. He has been having trouble walking, getting in/out of chairs, and navigating stairs. He reports that he has had 2-3 falls in the past 6 months.    How long can you sit comfortably? no limitations   How long can you stand comfortably? 10 minutes on hard floors   How long can you walk comfortably? about 75 feet, <5 minutes   Patient Stated Goals improve strength, improve mobility   Currently in Pain? No/denies  pt took pain pill this morning            [facility] PT Assessment - 10/22/14 0001    Assessment   Medical Diagnosis LBP   Next MD Visit none scheduled   Prior Therapy yes-over a year ago   Balance Screen   Has the patient fallen in the past 6 months Yes   How many times? 2-3   Has the patient had a decrease in activity level because of a fear of falling?  No   Is the patient reluctant to leave their home because of a fear of falling?  No   Home Environment   Living Environment Private residence   Living Arrangements Alone   Type of [place] to enter   Entrance Stairs-Number of Steps 3   Entrance Stairs-Rails Right   [place] One level   Prior Function   Level of [place] Retired   Observation/Other Assessments   Focus on Therapeutic Outcomes (FOTO)  46% limitation   Posture/Postural Control   Posture Comments Weight shifted grossly to R   ROM / Strength   AROM / PROM / Strength AROM;PROM;Strength   AROM   AROM Assessment Site Lumbar   Lumbar Flexion 35   Lumbar Extension 10   Lumbar - Right Side Bend 25   Lumbar - Left Side Bend 15   PROM   PROM Assessment Site Hip   Right/Left Hip Right;Left   Right Hip External Rotation  39   Right Hip Internal Rotation  26   Left Hip Extension 35   Left Hip Flexion 31   Strength   Strength Assessment Site Hip;Knee;Ankle   Right/Left Hip Right;Left   Right Hip Flexion 3/5   Right Hip Extension 3+/5    Right Hip ABduction 4-/5   Left Hip Flexion 3+/5   Left Hip Extension 3+/5   Left Hip ABduction 4-/5   Right/Left Knee Right;Left   Right Knee Flexion 4-/5   Right Knee Extension 4+/5   Left Knee Flexion 4-/5   Left Knee Extension 4+/5   Right/Left Ankle Right;Left   Right Ankle Dorsiflexion 4/5  Left Ankle Dorsiflexion 4/5   Transfers   Five time sit to stand comments  55.62  utilizing BUE   Standardized Balance Assessment   Standardized Balance Assessment Berg Balance Test   Berg Balance Test   Sit to Stand Able to stand using hands after several tries   Standing Unsupported Able to stand 30 seconds unsupported   Sitting with Back Unsupported but Feet Supported on Floor or Stool Able to sit safely and securely 2 minutes   Stand to Sit Sits independently, has uncontrolled descent   Transfers Able to transfer safely, definite need of hands   Standing Unsupported with Eyes Closed Able to stand 3 seconds   Standing Ubsupported with Feet Together Needs help to attain position and unable to hold for 15 seconds   From Standing, Reach Forward with Outstretched Arm Reaches forward but needs supervision   From Standing Position, Pick up Object from Floor Unable to try/needs assist to keep balance   From Standing Position, Turn to Look Behind Over each Shoulder Turn sideways only but maintains balance   Turn 360 Degrees Needs close supervision or verbal cueing   Standing Unsupported, Alternately Place Feet on Step/Stool Needs assistance to keep from falling or unable to try   Standing Unsupported, One Foot in Front Loses balance while stepping or standing   Standing on One Leg Unable to try or needs assist to prevent fall   Total Score 18                           PT Education - 10/22/14 1255    Education provided Yes   Education Details HEP, POC   Person(s) Educated Patient   Methods Explanation;Handout   Comprehension Verbalized understanding;Returned  demonstration          PT Short Term Goals - 10/22/14 1656    PT SHORT TERM GOAL #1   Title Pt will be independent with HEP.    Time 2   Period Weeks   Status New   PT SHORT TERM GOAL #2   Title Pt will complete five time sit to stand with UE support in 35 seconds to demonstrate increased BLE strength.    Time 2   Period Weeks   Status New   PT SHORT TERM GOAL #3   Title Pt will score 24 or greater on Berg to demonstrate decreased fall risk.    Time 2   Period Weeks   Status New           PT Long Term Goals - 10/22/14 1659    PT LONG TERM GOAL #1   Title Pt will be indendent with advanced HEP.    Time 4   Period Weeks   Status New   PT LONG TERM GOAL #2   Title Pt will complete five time sit to stand without UE support in 30 seconds or less to demonstrate improved BLE strength.    Time 4   Period Weeks   Status New   PT LONG TERM GOAL #3   Title Pt will score 40 or greater on Berg to demonstrate decreased risk for falls.    Time 4   Period Weeks   Status New   PT LONG TERM GOAL #4   Title Pt will ascend/descend 8 stairs with reciprocal pattern and no LOB to demonstrate improved safety.    Time 4   Period Weeks   Status New  Plan - 10-28-2014 1637    Clinical Impression Statement Pt presents to PT with c/o LBP, difficulty with functional mobility, and  recent falls. He demonstrates impairmente in BLE strength, lumbar ROM, hip ROM, posture, functional mobility, and balance. Pt scored 18/56 on Berg, placing him at high risk for falls. Pt was unable to complete five time sit to stand test without using UE support, and took 55.62 seconds to complete using UE support, indicating that he has decreased funcitonal strength and power of BLE. Pt will benefit from skilled PT to address these impairments to decrease fall risk, improve safety, and return pt to optimal functional leve.    Pt will benefit from skilled therapeutic intervention in order to  improve on the following deficits Abnormal gait;Decreased activity tolerance;Decreased balance;Decreased endurance;Decreased range of motion;Decreased strength;Difficulty walking;Pain   Rehab Potential Fair   Clinical Impairments Affecting Rehab Potential Pt morbidly obese, hx of long standing back pain.    PT Frequency 2x / week   PT Duration 4 weeks   PT Treatment/Interventions ADLs/Self Care Home Management;Gait training;Stair training;Functional mobility training;Therapeutic activities;Therapeutic exercise;Balance training;Neuromuscular re-education;Patient/family education;Manual techniques   PT Next Visit Plan Begin balance training, functional strengthening   Consulted and Agree with Plan of Care Patient          G-Codes - 28-Oct-2014 1703    Functional Assessment Tool Used FOTO   Functional Limitation Mobility: Walking and moving around   Mobility: Walking and Moving Around Current Status [phone]) At least 40 percent but less than 60 percent impaired, limited or restricted   Mobility: Walking and Moving Around Goal Status [phone]) At least 40 percent but less than 60 percent impaired, limited or restricted       Problem List Patient Active Problem List   Diagnosis Date Noted  . GERD (gastroesophageal reflux disease) 12/25/2013  . Anemia of chronic disease 12/25/2013  . Constipation 10/27/2013  . Other malaise and fatigue 10/27/2013  . Unspecified hypothyroidism 10/27/2013  . Bowel habit changes 07/16/2013  . Flatulence 07/16/2013  . Occipital pain 04/07/2013  . Hyperlipidemia 01/06/2013  . Angina effort, wtih positive stress test 12/20/2012  . CKD (chronic kidney disease) stage 3, GFR 30-59 ml/min 12/20/2012  . Bradycardia, to 30 12/20/2012  . Weak 12/17/2012  . Dizziness 12/17/2012  . Exertional angina-  12/09/2012  . Dyspnea 06/09/2011  . Sleep apnea, on C-pap 06/09/2011  . PVD, Lt Vertebral artery PTA in '08, known moderate ICA disease 06/09/2011  . Chronic diastolic  heart failure 70/02/7492  . UTI (lower urinary tract infection) 06/01/2011  . DM (diabetes mellitus) 06/01/2011  . Hypothyroidism 06/01/2011  . HTN (hypertension) 06/01/2011  . COPD (chronic obstructive pulmonary disease) 06/01/2011  . CAD, CABG '98, cath '08, low risk Myoview Feb 2012, stable cath 12/19/12 with chronically occluded VG to LCX, patent LIMA-LAD & VG to dominant rt.  06/01/2011  . Obesity 06/01/2011  . Depression 06/01/2011  . Leg weakness, bilateral 10/26/2010  . Stiffness of joints, not elsewhere classified, multiple sites 10/26/2010  . Back pain, chronic, prior back surg 10/20/2010    Hilma Favors, PT, DPT [phone] 2014-10-28, 5:04 PM  [facility] [address] [place], [place], [address] Phone: [phone]   Fax:  [phone]

## 2014-10-22 NOTE — Patient Instructions (Signed)
KNEE: Extension, Long Arc Quads - Sitting   Raise leg until knee is straight. __10_ reps per set, _2__ sets per day, __5_ days per week  Copyright  VHI. All rights reserved.  Bridging   Slowly raise buttocks from floor, keeping stomach tight. Repeat _10___ times per set. Do __1__ sets per session. Do _2___ sessions per day.  http://orth.exer.us/1096   Copyright  VHI. All rights reserved.  Straight Leg Raise   Tighten stomach and slowly raise locked right leg __15-18__ inches from floor. Repeat __10__ times per set. Do __1__ sets per session. Do __2__ sessions per day.  http://orth.exer.us/1102   Copyright  VHI. All rights reserved.

## 2014-10-27 ENCOUNTER — Ambulatory Visit (HOSPITAL_COMMUNITY): Payer: Medicare Other | Admitting: Physical Therapy

## 2014-10-27 DIAGNOSIS — Z9181 History of falling: Secondary | ICD-10-CM

## 2014-10-27 DIAGNOSIS — M545 Low back pain, unspecified: Secondary | ICD-10-CM

## 2014-10-27 DIAGNOSIS — Z7409 Other reduced mobility: Secondary | ICD-10-CM

## 2014-10-27 DIAGNOSIS — R29898 Other symptoms and signs involving the musculoskeletal system: Secondary | ICD-10-CM

## 2014-10-27 NOTE — Therapy (Signed)
[facility] [place], [place], [address] Phone: [phone]   Fax:  [phone]  Physical Therapy Treatment  Patient Details  Name: [name] MRN: [id number] Date of Birth: [birth year] Referring Provider:  Erline Levine, MD  Encounter Date: 10/27/2014      PT End of Session - 10/27/14 0927    Visit Number 2   Number of Visits 10   Date for PT Re-Evaluation 11/20/14   Authorization Type Medicare   Authorization - Visit Number 2   Authorization - Number of Visits 10   PT Start Time [phone]   PT Stop Time 0933   PT Time Calculation (min) 50 min   Activity Tolerance Patient tolerated treatment well   Behavior During Therapy [facility] for tasks assessed/performed      Past Medical History  Diagnosis Date  . Diabetes mellitus type II   . Hypothyroidism   . Hypertension   . GERD (gastroesophageal reflux disease)   . Angina   . CHF (congestive heart failure)     diastolic  . Heart attack   . COPD (chronic obstructive pulmonary disease)   . Coronary artery disease     CABG '98, cath '08, low risk Nuc 3/14  . Obesity   . PVD (peripheral vascular disease)     Lt vertebral stent '98  . Diastolic dysfunction     grade 1 with an EF of 60-65% 4/13  . OSA on CPAP   . High cholesterol   . Chronic bronchitis     "usually get it q yr" (12/17/2012)  . Exertional shortness of breath   . Arthritis     "knees, hips" (12/17/2012)  . Gout     "ankles, legs" (12/17/2012)  . Pneumonia 05/2011  . CAD, CABG '98, cath '08, low risk Myoview Feb 2012, stable cath 12/19/12 with chronically occluded VG to LCX, patent LIMA-LAD & VG to dominant rt.  06/01/2011    Patent LIMA-LAD, oclluded SVG-CFX, patent SVG-RCA in 2008   . Bradycardia, to 30 12/20/2012  . Nephrolithiasis   . CKD (chronic kidney disease) stage 3, GFR 30-59 ml/min 12/20/2012    Past Surgical History  Procedure Laterality Date  . Back surgery    . Replantation finger Right  2000    "little finger" (12/17/2012)  . Cholecystectomy    . Tonsillectomy  ?1960's  . Knee arthroscopy Left 1990's  . Lumbar laminectomy/decompression microdiscectomy  04/11/2011    Procedure: LUMBAR LAMINECTOMY/DECOMPRESSION MICRODISCECTOMY 1 LEVEL;  Surgeon: Peggyann Shoals, MD;  Location: [place] [facility]URO ORS;  Service: Neurosurgery;  Laterality: Right;  RIGHT Lumbar Three-Four laminectomy with resection of synovial cyst  . Angioplasty  '99, 00, March 06  . Tibia fracture surgery Right ~ 2012  . Coronary artery bypass graft  04/1996    "CABG X6" (12/17/2012)  . Cataract extraction w/ intraocular lens  implant, bilateral    . Lithotripsy      "once" (12/17/2012)  . Cardiac catheterization  April 2006; 12/19/2012  . Coronary angioplasty with stent placement      "I've had a total of 3-4 stents put in" (12/17/2012)  . Fracture surgery    . Colonoscopy  08/09/2004    FBP:[id number] diverticula.  Remainder of colonic mucosa appeared normal/normal rectum  . Vertiberal artery stent      1998, 99% blocked.  . Colonoscopy N/A 07/31/2013    DPO:EUMPNT colonic polyp-removed as described above. Status post segmental biopsy to evaluate for microscopic colitis. bx  tubular adenoma, asc colon folcal active colitis/lyphoid aggregates (nonspecific)  . Left heart catheterization with coronary angiogram N/A 12/19/2012    Procedure: LEFT HEART CATHETERIZATION WITH CORONARY ANGIOGRAM;  Surgeon: Lorretta Harp, MD;  Location: [facility] CATH LAB;  Service: Cardiovascular;  Laterality: N/A;    There were no vitals filed for this visit.  Visit Diagnosis:  Risk for falls  Bilateral low back pain without sciatica  Weakness of both legs  Impaired functional mobility and activity tolerance      Subjective Assessment - 10/27/14 0849    Subjective Patient reports that he is having low back pain this morning, reports that he is constantly having pain    Currently in Pain? Yes   Pain Score 5    Pain Location Back                          [facility] Adult PT Treatment/Exercise - 10/27/14 0001    Lumbar Exercises: Aerobic   Stationary Bike Nustep resistance 3 for 6 minutes    Lumbar Exercises: Standing   Heel Raises 15 reps   Functional Squats 10 reps   Functional Squats Limitations cues for form    Forward Lunge 10 reps   Forward Lunge Limitations 4 inch box    Lumbar Exercises: Seated   Other Seated Lumbar Exercises Hip flexion 1x10; opposite UE and LE flexion 1x10   Lumbar Exercises: Supine   Ab Set 20 reps;3 seconds   AB Set Limitations visual cues for form    Bent Knee Raise 10 reps   Bent Knee Raise Limitations with ab sets   Dead Bug 10 reps   Dead Bug Limitations with ab sets    Bridge 10 reps   Lumbar Exercises: Sidelying   Clam 10 reps   Clam Limitations each side                PT Education - 10/27/14 0927    Education provided Yes   Education Details reviewed initial eval, goals    Person(s) Educated Patient   Methods Explanation;Handout   Comprehension Verbalized understanding          PT Short Term Goals - 10/22/14 1656    PT SHORT TERM GOAL #1   Title Pt will be independent with HEP.    Time 2   Period Weeks   Status New   PT SHORT TERM GOAL #2   Title Pt will complete five time sit to stand with UE support in 35 seconds to demonstrate increased BLE strength.    Time 2   Period Weeks   Status New   PT SHORT TERM GOAL #3   Title Pt will score 24 or greater on Berg to demonstrate decreased fall risk.    Time 2   Period Weeks   Status New           PT Long Term Goals - 10/22/14 1659    PT LONG TERM GOAL #1   Title Pt will be indendent with advanced HEP.    Time 4   Period Weeks   Status New   PT LONG TERM GOAL #2   Title Pt will complete five time sit to stand without UE support in 30 seconds or less to demonstrate improved BLE strength.    Time 4   Period Weeks   Status New   PT LONG TERM GOAL #3   Title Pt will score 40 or  greater on  Berg to demonstrate decreased risk for falls.    Time 4   Period Weeks   Status New   PT LONG TERM GOAL #4   Title Pt will ascend/descend 8 stairs with reciprocal pattern and no LOB to demonstrate improved safety.    Time 4   Period Weeks   Status New               Plan - 10/27/14 1751    Clinical Impression Statement Introduced functional exercise and functional activity tolerance tasks today with focus on core activation during all functional exercises. Patient able to perform exercises with cues for form. Also reviewed initial evaluation and goals with patient today.  Pain decreased from 5/10 to 0/10 at end of session.    Pt will benefit from skilled therapeutic intervention in order to improve on the following deficits Abnormal gait;Decreased activity tolerance;Decreased balance;Decreased endurance;Decreased range of motion;Decreased strength;Difficulty walking;Pain   Rehab Potential Fair   Clinical Impairments Affecting Rehab Potential Pt morbidly obese, hx of long standing back pain.    PT Frequency 2x / week   PT Duration 4 weeks   PT Treatment/Interventions ADLs/Self Care Home Management;Gait training;Stair training;Functional mobility training;Therapeutic activities;Therapeutic exercise;Balance training;Neuromuscular re-education;Patient/family education;Manual techniques   PT Next Visit Plan Continue functional strength and core work, begin Hotel manager    Consulted and Agree with Plan of Care Patient        Problem List Patient Active Problem List   Diagnosis Date Noted  . GERD (gastroesophageal reflux disease) 12/25/2013  . Anemia of chronic disease 12/25/2013  . Constipation 10/27/2013  . Other malaise and fatigue 10/27/2013  . Unspecified hypothyroidism 10/27/2013  . Bowel habit changes 07/16/2013  . Flatulence 07/16/2013  . Occipital pain 04/07/2013  . Hyperlipidemia 01/06/2013  . Angina effort, wtih positive stress test 12/20/2012  . CKD  (chronic kidney disease) stage 3, GFR 30-59 ml/min 12/20/2012  . Bradycardia, to 30 12/20/2012  . Weak 12/17/2012  . Dizziness 12/17/2012  . Exertional angina-  12/09/2012  . Dyspnea 06/09/2011  . Sleep apnea, on C-pap 06/09/2011  . PVD, Lt Vertebral artery PTA in '08, known moderate ICA disease 06/09/2011  . Chronic diastolic heart failure 02/58/5277  . UTI (lower urinary tract infection) 06/01/2011  . DM (diabetes mellitus) 06/01/2011  . Hypothyroidism 06/01/2011  . HTN (hypertension) 06/01/2011  . COPD (chronic obstructive pulmonary disease) 06/01/2011  . CAD, CABG '98, cath '08, low risk Myoview Feb 2012, stable cath 12/19/12 with chronically occluded VG to LCX, patent LIMA-LAD & VG to dominant rt.  06/01/2011  . Obesity 06/01/2011  . Depression 06/01/2011  . Leg weakness, bilateral 10/26/2010  . Stiffness of joints, not elsewhere classified, multiple sites 10/26/2010  . Back pain, chronic, prior back surg 10/20/2010    Deniece Ree PT, DPT [facility] [address] [place], [place], [address] Phone: [phone]   Fax:  [phone]

## 2014-10-29 ENCOUNTER — Ambulatory Visit (HOSPITAL_COMMUNITY): Payer: Medicare Other

## 2014-10-29 ENCOUNTER — Ambulatory Visit (HOSPITAL_COMMUNITY): Payer: Medicare Other | Attending: Neurosurgery | Admitting: Physical Therapy

## 2014-10-29 DIAGNOSIS — Z9181 History of falling: Secondary | ICD-10-CM | POA: Insufficient documentation

## 2014-10-29 DIAGNOSIS — R29898 Other symptoms and signs involving the musculoskeletal system: Secondary | ICD-10-CM | POA: Diagnosis present

## 2014-10-29 DIAGNOSIS — Z7409 Other reduced mobility: Secondary | ICD-10-CM | POA: Insufficient documentation

## 2014-10-29 DIAGNOSIS — M545 Low back pain, unspecified: Secondary | ICD-10-CM

## 2014-10-29 NOTE — Therapy (Signed)
[facility] [address] [place], [place], [address] Phone: [phone]   Fax:  [phone]  Physical Therapy Treatment  Patient Details  Name: [name] MRN: [id number] Date of Birth: Dec 04, 1933 Referring Provider:  Erline Levine, MD  Encounter Date: 10/29/2014      PT End of Session - 10/29/14 1141    Visit Number 3   Number of Visits 10   Date for PT Re-Evaluation 11/20/14   Authorization Type Medicare   Authorization - Visit Number 3   Authorization - Number of Visits 10   PT Start Time 5397   PT Stop Time 1059   PT Time Calculation (min) 44 min   Equipment Utilized During Treatment Gait belt   Activity Tolerance Patient tolerated treatment well   Behavior During Therapy [facility] for tasks assessed/performed      Past Medical History  Diagnosis Date  . Diabetes mellitus type II   . Hypothyroidism   . Hypertension   . GERD (gastroesophageal reflux disease)   . Angina   . CHF (congestive heart failure)     diastolic  . Heart attack   . COPD (chronic obstructive pulmonary disease)   . Coronary artery disease     CABG '98, cath '08, low risk Nuc 3/14  . Obesity   . PVD (peripheral vascular disease)     Lt vertebral stent '98  . Diastolic dysfunction     grade 1 with an EF of 60-65% 4/13  . OSA on CPAP   . High cholesterol   . Chronic bronchitis     "usually get it q yr" (12/17/2012)  . Exertional shortness of breath   . Arthritis     "knees, hips" (12/17/2012)  . Gout     "ankles, legs" (12/17/2012)  . Pneumonia 05/2011  . CAD, CABG '98, cath '08, low risk Myoview Feb 2012, stable cath 12/19/12 with chronically occluded VG to LCX, patent LIMA-LAD & VG to dominant rt.  06/01/2011    Patent LIMA-LAD, oclluded SVG-CFX, patent SVG-RCA in 2008   . Bradycardia, to 30 12/20/2012  . Nephrolithiasis   . CKD (chronic kidney disease) stage 3, GFR 30-59 ml/min 12/20/2012    Past Surgical History  Procedure Laterality Date   . Back surgery    . Replantation finger Right 2000    "little finger" (12/17/2012)  . Cholecystectomy    . Tonsillectomy  ?1960's  . Knee arthroscopy Left 1990's  . Lumbar laminectomy/decompression microdiscectomy  04/11/2011    Procedure: LUMBAR LAMINECTOMY/DECOMPRESSION MICRODISCECTOMY 1 LEVEL;  Surgeon: Peggyann Shoals, MD;  Location: [facility];  Service: Neurosurgery;  Laterality: Right;  RIGHT Lumbar Three-Four laminectomy with resection of synovial cyst  . Angioplasty  '99, 00, March 06  . Tibia fracture surgery Right ~ 2012  . Coronary artery bypass graft  04/1996    "CABG X6" (12/17/2012)  . Cataract extraction w/ intraocular lens  implant, bilateral    . Lithotripsy      "once" (12/17/2012)  . Cardiac catheterization  April 2006; 12/19/2012  . Coronary angioplasty with stent placement      "I've had a total of 3-4 stents put in" (12/17/2012)  . Fracture surgery    . Colonoscopy  08/09/2004    QBH:[id number] diverticula.  Remainder of colonic mucosa appeared normal/normal rectum  . Vertiberal artery stent      1998, 99% blocked.  . Colonoscopy N/A 07/31/2013    WIO:[id number] colonic polyp-removed as described above. Status post  segmental biopsy to evaluate for microscopic colitis. bx tubular adenoma, asc colon folcal active colitis/lyphoid aggregates (nonspecific)  . Left heart catheterization with coronary angiogram N/A 12/19/2012    Procedure: LEFT HEART CATHETERIZATION WITH CORONARY ANGIOGRAM;  Surgeon: Lorretta Harp, MD;  Location: [facility] CATH LAB;  Service: Cardiovascular;  Laterality: N/A;    There were no vitals filed for this visit.  Visit Diagnosis:  Risk for falls  Bilateral low back pain without sciatica  Weakness of both legs  Impaired functional mobility and activity tolerance      Subjective Assessment - 10/29/14 1025    Subjective Pt reports that he is having pain in his low back this morning, he has taken 2 pain pills today. He reports that he wasn't able to  sleep last night.    Currently in Pain? Yes   Pain Score 5    Pain Location Back   Pain Orientation Lower                   [facility] Adult PT Treatment/Exercise - 10/29/14 0001    Lumbar Exercises: Standing   Heel Raises 15 reps   Functional Squats 10 reps   Functional Squats Limitations at mat table, cues for posture   Forward Lunge 10 reps   Forward Lunge Limitations 4 inch box    Lumbar Exercises: Supine   Ab Set 20 reps;3 seconds   AB Set Limitations visual cues for form    Bent Knee Raise 10 reps   Bent Knee Raise Limitations with ab sets   Bridge 10 reps   Lumbar Exercises: Sidelying   Clam 15 reps             Balance Exercises - 10/29/14 1140    Balance Exercises: Standing   Tandem Stance 2 reps;15 secs   Standing, One Foot on a Step 4 inch;2 reps;15 secs             PT Short Term Goals - 10/22/14 1656    PT SHORT TERM GOAL #1   Title Pt will be independent with HEP.    Time 2   Period Weeks   Status New   PT SHORT TERM GOAL #2   Title Pt will complete five time sit to stand with UE support in 35 seconds to demonstrate increased BLE strength.    Time 2   Period Weeks   Status New   PT SHORT TERM GOAL #3   Title Pt will score 24 or greater on Berg to demonstrate decreased fall risk.    Time 2   Period Weeks   Status New           PT Long Term Goals - 10/22/14 1659    PT LONG TERM GOAL #1   Title Pt will be indendent with advanced HEP.    Time 4   Period Weeks   Status New   PT LONG TERM GOAL #2   Title Pt will complete five time sit to stand without UE support in 30 seconds or less to demonstrate improved BLE strength.    Time 4   Period Weeks   Status New   PT LONG TERM GOAL #3   Title Pt will score 40 or greater on Berg to demonstrate decreased risk for falls.    Time 4   Period Weeks   Status New   PT LONG TERM GOAL #4   Title Pt will ascend/descend 8 stairs with reciprocal pattern and no LOB  to demonstrate improved  safety.    Time 4   Period Weeks   Status New               Plan - 10/29/14 1143    Clinical Impression Statement Pt was completing supine strengthening exercises when PT noticed that he was bleeding from scapular region. Pt reported that he had a small scrape that he had bandaged that morning, but due to anticoagulants, he had bled through the bandage. The area was cleaned and dressed with gauze, pt was able to continue with remainder of treatment session without pain or further bleeding. Balance training was added today, and pt was able to complete tandem stance and standing with foot on step with little difficulty. Pt requires cueing to stay on task throughout treatment session.    PT Next Visit Plan Continue with core strengthening, balance training. Resume Nustep next session        Problem List Patient Active Problem List   Diagnosis Date Noted  . GERD (gastroesophageal reflux disease) 12/25/2013  . Anemia of chronic disease 12/25/2013  . Constipation 10/27/2013  . Other malaise and fatigue 10/27/2013  . Unspecified hypothyroidism 10/27/2013  . Bowel habit changes 07/16/2013  . Flatulence 07/16/2013  . Occipital pain 04/07/2013  . Hyperlipidemia 01/06/2013  . Angina effort, wtih positive stress test 12/20/2012  . CKD (chronic kidney disease) stage 3, GFR 30-59 ml/min 12/20/2012  . Bradycardia, to 30 12/20/2012  . Weak 12/17/2012  . Dizziness 12/17/2012  . Exertional angina-  12/09/2012  . Dyspnea 06/09/2011  . Sleep apnea, on C-pap 06/09/2011  . PVD, Lt Vertebral artery PTA in '08, known moderate ICA disease 06/09/2011  . Chronic diastolic heart failure 25/36/6440  . UTI (lower urinary tract infection) 06/01/2011  . DM (diabetes mellitus) 06/01/2011  . Hypothyroidism 06/01/2011  . HTN (hypertension) 06/01/2011  . COPD (chronic obstructive pulmonary disease) 06/01/2011  . CAD, CABG '98, cath '08, low risk Myoview Feb 2012, stable cath 12/19/12 with chronically  occluded VG to LCX, patent LIMA-LAD & VG to dominant rt.  06/01/2011  . Obesity 06/01/2011  . Depression 06/01/2011  . Leg weakness, bilateral 10/26/2010  . Stiffness of joints, not elsewhere classified, multiple sites 10/26/2010  . Back pain, chronic, prior back surg 10/20/2010    Hilma Favors, PT, DPT [phone] 10/29/2014, 11:46 AM  [facility] [place], [place], [address] Phone: [phone]   Fax:  [phone]

## 2014-11-04 ENCOUNTER — Ambulatory Visit (HOSPITAL_COMMUNITY): Payer: Medicare Other | Admitting: Physical Therapy

## 2014-11-04 DIAGNOSIS — Z9181 History of falling: Secondary | ICD-10-CM

## 2014-11-04 DIAGNOSIS — M545 Low back pain, unspecified: Secondary | ICD-10-CM

## 2014-11-04 DIAGNOSIS — Z7409 Other reduced mobility: Secondary | ICD-10-CM

## 2014-11-04 DIAGNOSIS — R29898 Other symptoms and signs involving the musculoskeletal system: Secondary | ICD-10-CM

## 2014-11-04 NOTE — Therapy (Signed)
[facility] [address] [place], [place], [address] Phone: [phone]   Fax:  [phone]  Physical Therapy Treatment  Patient Details  Name: [name] MRN: [id number] Date of Birth: 01-Jul-1933 Referring Provider:  Erline Levine, MD  Encounter Date: 11/04/2014      PT End of Session - 11/04/14 1014    Visit Number 4   Number of Visits 10   Date for PT Re-Evaluation 11/20/14   Authorization Type Medicare   Authorization - Visit Number 4   Authorization - Number of Visits 10   PT Start Time 0840   PT Stop Time 0930   PT Time Calculation (min) 50 min   Equipment Utilized During Treatment Gait belt   Activity Tolerance Patient tolerated treatment well   Behavior During Therapy [facility] for tasks assessed/performed      Past Medical History  Diagnosis Date  . Diabetes mellitus type II   . Hypothyroidism   . Hypertension   . GERD (gastroesophageal reflux disease)   . Angina   . CHF (congestive heart failure)     diastolic  . Heart attack   . COPD (chronic obstructive pulmonary disease)   . Coronary artery disease     CABG '98, cath '08, low risk Nuc 3/14  . Obesity   . PVD (peripheral vascular disease)     Lt vertebral stent '98  . Diastolic dysfunction     grade 1 with an EF of 60-65% 4/13  . OSA on CPAP   . High cholesterol   . Chronic bronchitis     "usually get it q yr" (12/17/2012)  . Exertional shortness of breath   . Arthritis     "knees, hips" (12/17/2012)  . Gout     "ankles, legs" (12/17/2012)  . Pneumonia 05/2011  . CAD, CABG '98, cath '08, low risk Myoview Feb 2012, stable cath 12/19/12 with chronically occluded VG to LCX, patent LIMA-LAD & VG to dominant rt.  06/01/2011    Patent LIMA-LAD, oclluded SVG-CFX, patent SVG-RCA in 2008   . Bradycardia, to 30 12/20/2012  . Nephrolithiasis   . CKD (chronic kidney disease) stage 3, GFR 30-59 ml/min 12/20/2012    Past Surgical History  Procedure Laterality Date   . Back surgery    . Replantation finger Right 2000    "little finger" (12/17/2012)  . Cholecystectomy    . Tonsillectomy  ?1960's  . Knee arthroscopy Left 1990's  . Lumbar laminectomy/decompression microdiscectomy  04/11/2011    Procedure: LUMBAR LAMINECTOMY/DECOMPRESSION MICRODISCECTOMY 1 LEVEL;  Surgeon: Peggyann Shoals, MD;  Location: [facility]URO ORS;  Service: Neurosurgery;  Laterality: Right;  RIGHT Lumbar Three-Four laminectomy with resection of synovial cyst  . Angioplasty  '99, 00, March 06  . Tibia fracture surgery Right ~ 2012  . Coronary artery bypass graft  04/1996    "CABG X6" (12/17/2012)  . Cataract extraction w/ intraocular lens  implant, bilateral    . Lithotripsy      "once" (12/17/2012)  . Cardiac catheterization  April 2006; 12/19/2012  . Coronary angioplasty with stent placement      "I've had a total of 3-4 stents put in" (12/17/2012)  . Fracture surgery    . Colonoscopy  08/09/2004    [id number] diverticula.  Remainder of colonic mucosa appeared normal/normal rectum  . Vertiberal artery stent      1998, 99% blocked.  . Colonoscopy N/A 07/31/2013    HAL:[id number] colonic polyp-removed as described above. Status post  segmental biopsy to evaluate for microscopic colitis. bx tubular adenoma, asc colon folcal active colitis/lyphoid aggregates (nonspecific)  . Left heart catheterization with coronary angiogram N/A 12/19/2012    Procedure: LEFT HEART CATHETERIZATION WITH CORONARY ANGIOGRAM;  Surgeon: Lorretta Harp, MD;  Location: [facility] CATH LAB;  Service: Cardiovascular;  Laterality: N/A;    There were no vitals filed for this visit.  Visit Diagnosis:  Risk for falls  Bilateral low back pain without sciatica  Weakness of both legs  Impaired functional mobility and activity tolerance      Subjective Assessment - 11/04/14 1013    Subjective Pt states he doesn't have pain as long as he takes his pain pills.  Currently without pain.    Currently in Pain? No/denies                          [facility] Adult PT Treatment/Exercise - 11/04/14 0843    Lumbar Exercises: Standing   Heel Raises 15 reps   Heel Raises Limitations toeraises 10 reps each individually   Functional Squats 15 reps   Functional Squats Limitations at parallel bars   Forward Lunge 15 reps   Forward Lunge Limitations 4 inch box    Other Standing Lumbar Exercises lateral step ups, forward step ups 4" 10 reps    Lumbar Exercises: Seated   Sit to Stand 10 reps   Sit to Stand Limitations with SPC only   Other Seated Lumbar Exercises Hip flexion 1x10; LAQ 1x10                  PT Short Term Goals - 10/22/14 1656    PT SHORT TERM GOAL #1   Title Pt will be independent with HEP.    Time 2   Period Weeks   Status New   PT SHORT TERM GOAL #2   Title Pt will complete five time sit to stand with UE support in 35 seconds to demonstrate increased BLE strength.    Time 2   Period Weeks   Status New   PT SHORT TERM GOAL #3   Title Pt will score 24 or greater on Berg to demonstrate decreased fall risk.    Time 2   Period Weeks   Status New           PT Long Term Goals - 10/22/14 1659    PT LONG TERM GOAL #1   Title Pt will be indendent with advanced HEP.    Time 4   Period Weeks   Status New   PT LONG TERM GOAL #2   Title Pt will complete five time sit to stand without UE support in 30 seconds or less to demonstrate improved BLE strength.    Time 4   Period Weeks   Status New   PT LONG TERM GOAL #3   Title Pt will score 40 or greater on Berg to demonstrate decreased risk for falls.    Time 4   Period Weeks   Status New   PT LONG TERM GOAL #4   Title Pt will ascend/descend 8 stairs with reciprocal pattern and no LOB to demonstrate improved safety.    Time 4   Period Weeks   Status New               Plan - 11/04/14 1014    Clinical Impression Statement Continued to progress funtional stregnthening of LE's.  Progressed with forward and  lateral step ups  today and sit to stand task to improve LE strength.  Pt unable to complete without use of 1 UE and sit to stand being most challenging.  Pt requrires frequent cueing to remain on task as patient is quite a talker.   pt reports of pain or difficulty during treatment today, only general fatigue. Unable to resume nustep today due to time.    PT Next Visit Plan Continue with core strengthening, balance training. Resume Nustep next session        Problem List Patient Active Problem List   Diagnosis Date Noted  . GERD (gastroesophageal reflux disease) 12/25/2013  . Anemia of chronic disease 12/25/2013  . Constipation 10/27/2013  . Other malaise and fatigue 10/27/2013  . Unspecified hypothyroidism 10/27/2013  . Bowel habit changes 07/16/2013  . Flatulence 07/16/2013  . Occipital pain 04/07/2013  . Hyperlipidemia 01/06/2013  . Angina effort, wtih positive stress test 12/20/2012  . CKD (chronic kidney disease) stage 3, GFR 30-59 ml/min 12/20/2012  . Bradycardia, to 30 12/20/2012  . Weak 12/17/2012  . Dizziness 12/17/2012  . Exertional angina-  12/09/2012  . Dyspnea 06/09/2011  . Sleep apnea, on C-pap 06/09/2011  . PVD, Lt Vertebral artery PTA in '08, known moderate ICA disease 06/09/2011  . Chronic diastolic heart failure 09/07/1973  . UTI (lower urinary tract infection) 06/01/2011  . DM (diabetes mellitus) 06/01/2011  . Hypothyroidism 06/01/2011  . HTN (hypertension) 06/01/2011  . COPD (chronic obstructive pulmonary disease) 06/01/2011  . CAD, CABG '98, cath '08, low risk Myoview Feb 2012, stable cath 12/19/12 with chronically occluded VG to LCX, patent LIMA-LAD & VG to dominant rt.  06/01/2011  . Obesity 06/01/2011  . Depression 06/01/2011  . Leg weakness, bilateral 10/26/2010  . Stiffness of joints, not elsewhere classified, multiple sites 10/26/2010  . Back pain, chronic, prior back surg 10/20/2010    Teena Irani, PTA/CLT [phone] 11/04/2014, 10:17  AM  [facility] [place], [place], [address] Phone: [phone]   Fax:  [phone]

## 2014-11-06 ENCOUNTER — Ambulatory Visit (HOSPITAL_COMMUNITY): Payer: Medicare Other

## 2014-11-06 DIAGNOSIS — M545 Low back pain, unspecified: Secondary | ICD-10-CM

## 2014-11-06 DIAGNOSIS — Z7409 Other reduced mobility: Secondary | ICD-10-CM

## 2014-11-06 DIAGNOSIS — R29898 Other symptoms and signs involving the musculoskeletal system: Secondary | ICD-10-CM

## 2014-11-06 DIAGNOSIS — Z9181 History of falling: Secondary | ICD-10-CM | POA: Diagnosis not present

## 2014-11-06 NOTE — Therapy (Signed)
[place], [place], [address] Phone: [phone]   Fax:  [phone]  Physical Therapy Treatment  Patient Details  Name: [name] MRN: [id number] Date of Birth: 10/29/1933 Referring Provider:  Sinda Du, MD  Encounter Date: 11/06/2014      PT End of Session - 11/06/14 1048    Visit Number 5   Number of Visits 10   Date for PT Re-Evaluation 11/20/14   Authorization Type Medicare   Authorization - Visit Number 5   Authorization - Number of Visits 10   PT Start Time 1019   PT Stop Time 1104   PT Time Calculation (min) 45 min   Activity Tolerance Patient tolerated treatment well   Behavior During Therapy [facility] for tasks assessed/performed      Past Medical History  Diagnosis Date  . Diabetes mellitus type II   . Hypothyroidism   . Hypertension   . GERD (gastroesophageal reflux disease)   . Angina   . CHF (congestive heart failure)     diastolic  . Heart attack   . COPD (chronic obstructive pulmonary disease)   . Coronary artery disease     CABG '98, cath '08, low risk Nuc 3/14  . Obesity   . PVD (peripheral vascular disease)     Lt vertebral stent '98  . Diastolic dysfunction     grade 1 with an EF of 60-65% 4/13  . OSA on CPAP   . High cholesterol   . Chronic bronchitis     "usually get it q yr" (12/17/2012)  . Exertional shortness of breath   . Arthritis     "knees, hips" (12/17/2012)  . Gout     "ankles, legs" (12/17/2012)  . Pneumonia 05/2011  . CAD, CABG '98, cath '08, low risk Myoview Feb 2012, stable cath 12/19/12 with chronically occluded VG to LCX, patent LIMA-LAD & VG to dominant rt.  06/01/2011    Patent LIMA-LAD, oclluded SVG-CFX, patent SVG-RCA in 2008   . Bradycardia, to 30 12/20/2012  . Nephrolithiasis   . CKD (chronic kidney disease) stage 3, GFR 30-59 ml/min 12/20/2012    Past Surgical History  Procedure Laterality Date  . Back surgery    . Replantation finger  Right 2000    "little finger" (12/17/2012)  . Cholecystectomy    . Tonsillectomy  ?1960's  . Knee arthroscopy Left 1990's  . Lumbar laminectomy/decompression microdiscectomy  04/11/2011    Procedure: LUMBAR LAMINECTOMY/DECOMPRESSION MICRODISCECTOMY 1 LEVEL;  Surgeon: Peggyann Shoals, MD;  Location: [facility] NEURO ORS;  Service: Neurosurgery;  Laterality: Right;  RIGHT Lumbar Three-Four laminectomy with resection of synovial cyst  . Angioplasty  '99, 00, March 06  . Tibia fracture surgery Right ~ 2012  . Coronary artery bypass graft  04/1996    "CABG X6" (12/17/2012)  . Cataract extraction w/ intraocular lens  implant, bilateral    . Lithotripsy      "once" (12/17/2012)  . Cardiac catheterization  April 2006; 12/19/2012  . Coronary angioplasty with stent placement      "I've had a total of 3-4 stents put in" (12/17/2012)  . Fracture surgery    . Colonoscopy  08/09/2004    HYI:FOYDXAJ diverticula.  Remainder of colonic mucosa appeared normal/normal rectum  . Vertiberal artery stent      1998, 99% blocked.  . Colonoscopy N/A 07/31/2013    OIN:OMVEHM colonic polyp-removed as described above. Status post segmental biopsy to evaluate for microscopic colitis. bx  tubular adenoma, asc colon folcal active colitis/lyphoid aggregates (nonspecific)  . Left heart catheterization with coronary angiogram N/A 12/19/2012    Procedure: LEFT HEART CATHETERIZATION WITH CORONARY ANGIOGRAM;  Surgeon: Lorretta Harp, MD;  Location: [facility] CATH LAB;  Service: Cardiovascular;  Laterality: N/A;    There were no vitals filed for this visit.  Visit Diagnosis:  Risk for falls  Bilateral low back pain without sciatica  Weakness of both legs  Impaired functional mobility and activity tolerance      Subjective Assessment - 11/06/14 1026    Subjective Patient reports he is in a lot of pain today (7/10). Pt says since last session, his back has been aggravated by repeated sit to stands, and reports back has hurt ever  since.    Currently in Pain? Yes   Pain Score 7   Resolved to 4/10 after treatment.    Pain Location Back   Pain Orientation Lower                         [facility] Adult PT Treatment/Exercise - 11/06/14 0001    Lumbar Exercises: Stretches   Single Knee to Chest Stretch 3 reps;30 seconds  bilat   Lower Trunk Rotation --  20x bilat bilat knee swings   Piriformis Stretch 30 seconds;2 reps  supine figure-4 (FABER) stretch    Lumbar Exercises: Aerobic   Stationary Bike Nustep Level 2  Moist heat on low back, using arms to generate Tspine PROM.   Lumbar Exercises: Supine   Ab Set 10 reps  2x10 bilat contrlateral knee reaching in hooklying   AB Set Limitations hooklying, contralteral knee reaching   Clam 10 reps  RedTB   Bridge 10 reps  2 sets   Bridge Limitations c clam Red TB   Other Supine Lumbar Exercises Combo hipknee flexion, hip to 100 degrees  2x10 bilat   Other Supine Lumbar Exercises Short Arc Quads on Foam Roll  2x10 c 4# around shoe   Moist Heat Therapy   Number Minutes Moist Heat 10 Minutes   Moist Heat Location Lumbar Spine  While on Nustep.                 PT Education - 11/06/14 1048    Education provided No          PT Short Term Goals - 10/22/14 1656    PT SHORT TERM GOAL #1   Title Pt will be independent with HEP.    Time 2   Period Weeks   Status New   PT SHORT TERM GOAL #2   Title Pt will complete five time sit to stand with UE support in 35 seconds to demonstrate increased BLE strength.    Time 2   Period Weeks   Status New   PT SHORT TERM GOAL #3   Title Pt will score 24 or greater on Berg to demonstrate decreased fall risk.    Time 2   Period Weeks   Status New           PT Long Term Goals - 10/22/14 1659    PT LONG TERM GOAL #1   Title Pt will be indendent with advanced HEP.    Time 4   Period Weeks   Status New   PT LONG TERM GOAL #2   Title Pt will complete five time sit to stand without UE support in  30 seconds or less to demonstrate improved BLE strength.  Time 4   Period Weeks   Status New   PT LONG TERM GOAL #3   Title Pt will score 40 or greater on Berg to demonstrate decreased risk for falls.    Time 4   Period Weeks   Status New   PT LONG TERM GOAL #4   Title Pt will ascend/descend 8 stairs with reciprocal pattern and no LOB to demonstrate improved safety.    Time 4   Period Weeks   Status New               Plan - 11/06/14 1049    Clinical Impression Statement Pt demonstrating acute flare in back pain sicne last session, hence treatment focused on pain management and exercise in hooklying and supine to improve core strength while maintaing neutral spine curves. Pt showing progress in LE strengtheing exercises, but continues to show poor tolerance to functional close chained  exercises. PT will continue to focus on core strength and BLE strength within a tolerable range for patient.    Pt will benefit from skilled therapeutic intervention in order to improve on the following deficits Abnormal gait;Decreased activity tolerance;Decreased balance;Decreased endurance;Decreased range of motion;Decreased strength;Difficulty walking;Pain   Rehab Potential Fair   Clinical Impairments Affecting Rehab Potential Pt morbidly obese, hx of long standing back pain.    PT Frequency 2x / week   PT Duration 4 weeks   PT Treatment/Interventions ADLs/Self Care Home Management;Gait training;Stair training;Functional mobility training;Therapeutic activities;Therapeutic exercise;Balance training;Neuromuscular re-education;Patient/family education;Manual techniques   PT Next Visit Plan Continue with core strengthening, balance training. Resume Nustep next session   PT Home Exercise Plan No changes; told pt to DC activities that flare back pain.   Consulted and Agree with Plan of Care Patient        Problem List Patient Active Problem List   Diagnosis Date Noted  . GERD (gastroesophageal  reflux disease) 12/25/2013  . Anemia of chronic disease 12/25/2013  . Constipation 10/27/2013  . Other malaise and fatigue 10/27/2013  . Unspecified hypothyroidism 10/27/2013  . Bowel habit changes 07/16/2013  . Flatulence 07/16/2013  . Occipital pain 04/07/2013  . Hyperlipidemia 01/06/2013  . Angina effort, wtih positive stress test 12/20/2012  . CKD (chronic kidney disease) stage 3, GFR 30-59 ml/min 12/20/2012  . Bradycardia, to 30 12/20/2012  . Weak 12/17/2012  . Dizziness 12/17/2012  . Exertional angina-  12/09/2012  . Dyspnea 06/09/2011  . Sleep apnea, on C-pap 06/09/2011  . PVD, Lt Vertebral artery PTA in '08, known moderate ICA disease 06/09/2011  . Chronic diastolic heart failure 67/61/9509  . UTI (lower urinary tract infection) 06/01/2011  . DM (diabetes mellitus) 06/01/2011  . Hypothyroidism 06/01/2011  . HTN (hypertension) 06/01/2011  . COPD (chronic obstructive pulmonary disease) 06/01/2011  . CAD, CABG '98, cath '08, low risk Myoview Feb 2012, stable cath 12/19/12 with chronically occluded VG to LCX, patent LIMA-LAD & VG to dominant rt.  06/01/2011  . Obesity 06/01/2011  . Depression 06/01/2011  . Leg weakness, bilateral 10/26/2010  . Stiffness of joints, not elsewhere classified, multiple sites 10/26/2010  . Back pain, chronic, prior back surg 10/20/2010    Buccola,Allan C 11/06/2014, 12:18 PM  12:18 PM  Etta Grandchild, PT, DPT [place] License # [id number]       [facility] [address] [place], [place], [address] Phone: [phone]   Fax:  [phone]

## 2014-11-10 ENCOUNTER — Ambulatory Visit (HOSPITAL_COMMUNITY): Payer: Medicare Other

## 2014-11-10 DIAGNOSIS — Z7409 Other reduced mobility: Secondary | ICD-10-CM

## 2014-11-10 DIAGNOSIS — M545 Low back pain, unspecified: Secondary | ICD-10-CM

## 2014-11-10 DIAGNOSIS — Z9181 History of falling: Secondary | ICD-10-CM

## 2014-11-10 DIAGNOSIS — R29898 Other symptoms and signs involving the musculoskeletal system: Secondary | ICD-10-CM

## 2014-11-10 NOTE — Therapy (Signed)
[place], [place], [address] Phone: [phone]   Fax:  [phone]  Physical Therapy Treatment  Patient Details  Name: [name] MRN: [id number] Date of Birth: [birth year] Referring Provider:  Erline Levine, MD  Encounter Date: 11/10/2014      PT End of Session - 11/10/14 0911    Visit Number 6   Number of Visits 10   Date for PT Re-Evaluation 11/20/14   Authorization Type Medicare   Authorization - Visit Number 6   Authorization - Number of Visits 10   PT Start Time 0845   PT Stop Time 0930   PT Time Calculation (min) 45 min   Activity Tolerance Patient tolerated treatment well   Behavior During Therapy [facility] for tasks assessed/performed      Past Medical History  Diagnosis Date  . Diabetes mellitus type II   . Hypothyroidism   . Hypertension   . GERD (gastroesophageal reflux disease)   . Angina   . CHF (congestive heart failure)     diastolic  . Heart attack   . COPD (chronic obstructive pulmonary disease)   . Coronary artery disease     CABG '98, cath '08, low risk Nuc 3/14  . Obesity   . PVD (peripheral vascular disease)     Lt vertebral stent '98  . Diastolic dysfunction     grade 1 with an EF of 60-65% 4/13  . OSA on CPAP   . High cholesterol   . Chronic bronchitis     "usually get it q yr" (12/17/2012)  . Exertional shortness of breath   . Arthritis     "knees, hips" (12/17/2012)  . Gout     "ankles, legs" (12/17/2012)  . Pneumonia 05/2011  . CAD, CABG '98, cath '08, low risk Myoview Feb 2012, stable cath 12/19/12 with chronically occluded VG to LCX, patent LIMA-LAD & VG to dominant rt.  06/01/2011    Patent LIMA-LAD, oclluded SVG-CFX, patent SVG-RCA in 2008   . Bradycardia, to 30 12/20/2012  . Nephrolithiasis   . CKD (chronic kidney disease) stage 3, GFR 30-59 ml/min 12/20/2012    Past Surgical History  Procedure Laterality Date  . Back surgery    . Replantation finger Right  2000    "little finger" (12/17/2012)  . Cholecystectomy    . Tonsillectomy  ?1960's  . Knee arthroscopy Left 1990's  . Lumbar laminectomy/decompression microdiscectomy  04/11/2011    Procedure: LUMBAR LAMINECTOMY/DECOMPRESSION MICRODISCECTOMY 1 LEVEL;  Surgeon: Peggyann Shoals, MD;  Location: [place] NEURO ORS;  Service: Neurosurgery;  Laterality: Right;  RIGHT Lumbar Three-Four laminectomy with resection of synovial cyst  . Angioplasty  '99, 00, March 06  . Tibia fracture surgery Right ~ 2012  . Coronary artery bypass graft  04/1996    "CABG X6" (12/17/2012)  . Cataract extraction w/ intraocular lens  implant, bilateral    . Lithotripsy      "once" (12/17/2012)  . Cardiac catheterization  April 2006; 12/19/2012  . Coronary angioplasty with stent placement      "I've had a total of 3-4 stents put in" (12/17/2012)  . Fracture surgery    . Colonoscopy  08/09/2004    DDU:KGURKYH diverticula.  Remainder of colonic mucosa appeared normal/normal rectum  . Vertiberal artery stent      1998, 99% blocked.  . Colonoscopy N/A 07/31/2013    CWC:BJSEGB colonic polyp-removed as described above. Status post segmental biopsy to evaluate for microscopic colitis. bx  tubular adenoma, asc colon folcal active colitis/lyphoid aggregates (nonspecific)  . Left heart catheterization with coronary angiogram N/A 12/19/2012    Procedure: LEFT HEART CATHETERIZATION WITH CORONARY ANGIOGRAM;  Surgeon: Lorretta Harp, MD;  Location: [facility] CATH LAB;  Service: Cardiovascular;  Laterality: N/A;    There were no vitals filed for this visit.  Visit Diagnosis:  Risk for falls  Bilateral low back pain without sciatica  Weakness of both legs  Impaired functional mobility and activity tolerance      Subjective Assessment - 11/10/14 0846    Subjective Pt stated his back is feeling better since last session, pain scale 3-4/10   Currently in Pain? Yes   Pain Score 4    Pain Location Back   Pain Orientation Lower   Pain  Descriptors / Indicators Sore            [facility] Adult PT Treatment/Exercise - 11/10/14 0001    Lumbar Exercises: Aerobic   Stationary Bike Nustep Level 3 with UE and LE   Lumbar Exercises: Standing   Heel Raises 15 reps   Functional Squats 15 reps   Functional Squats Limitations at parallel bars   Forward Lunge 15 reps   Forward Lunge Limitations 4 inch box    Other Standing Lumbar Exercises Postural strengthening rows and shoulder extension with GTB 10x   Lumbar Exercises: Seated   Other Seated Lumbar Exercises 3D thoracic excursion   Lumbar Exercises: Supine   Ab Set 10 reps   AB Set Limitations hooklying, contralteral knee reaching   Bent Knee Raise 10 reps;5 seconds   Bent Knee Raise Limitations with ab sets   Bridge 10 reps                PT Education - 11/10/14 [phone]    Education provided Yes   Education Details Educated on importance of posture to reduce lower back pain, utilized spinal coloum as visiual   Person(s) Educated Patient   Methods Explanation;Tactile cues;Verbal cues   Comprehension Verbalized understanding;Returned demonstration;Tactile cues required          PT Short Term Goals - 10/22/14 1656    PT SHORT TERM GOAL #1   Title Pt will be independent with HEP.    Time 2   Period Weeks   Status New   PT SHORT TERM GOAL #2   Title Pt will complete five time sit to stand with UE support in 35 seconds to demonstrate increased BLE strength.    Time 2   Period Weeks   Status New   PT SHORT TERM GOAL #3   Title Pt will score 24 or greater on Berg to demonstrate decreased fall risk.    Time 2   Period Weeks   Status New           PT Long Term Goals - 10/22/14 1659    PT LONG TERM GOAL #1   Title Pt will be indendent with advanced HEP.    Time 4   Period Weeks   Status New   PT LONG TERM GOAL #2   Title Pt will complete five time sit to stand without UE support in 30 seconds or less to demonstrate improved BLE strength.    Time 4    Period Weeks   Status New   PT LONG TERM GOAL #3   Title Pt will score 40 or greater on Berg to demonstrate decreased risk for falls.    Time 4   Period Weeks  Status New   PT LONG TERM GOAL #4   Title Pt will ascend/descend 8 stairs with reciprocal pattern and no LOB to demonstrate improved safety.    Time 4   Period Weeks   Status New               Plan - 11/10/14 0913    Clinical Impression Statement Pt educated on importance of proper posture for back pain relief, utilized spinal cord as visual cueing.  Added 3D thoracic excursion to improve spinal mobilty and reduce hyperkyphotic posture and began postural strengthening with theraband.  Continued LE and core strengthening exercises with cueing for form and technique.  Pt reported pain reduced at end of session.     PT Next Visit Plan Continue with core strengthening, balance training. continue Nustep next session        Problem List Patient Active Problem List   Diagnosis Date Noted  . GERD (gastroesophageal reflux disease) 12/25/2013  . Anemia of chronic disease 12/25/2013  . Constipation 10/27/2013  . Other malaise and fatigue 10/27/2013  . Unspecified hypothyroidism 10/27/2013  . Bowel habit changes 07/16/2013  . Flatulence 07/16/2013  . Occipital pain 04/07/2013  . Hyperlipidemia 01/06/2013  . Angina effort, wtih positive stress test 12/20/2012  . CKD (chronic kidney disease) stage 3, GFR 30-59 ml/min 12/20/2012  . Bradycardia, to 30 12/20/2012  . Weak 12/17/2012  . Dizziness 12/17/2012  . Exertional angina-  12/09/2012  . Dyspnea 06/09/2011  . Sleep apnea, on C-pap 06/09/2011  . PVD, Lt Vertebral artery PTA in '08, known moderate ICA disease 06/09/2011  . Chronic diastolic heart failure 28/63/8177  . UTI (lower urinary tract infection) 06/01/2011  . DM (diabetes mellitus) 06/01/2011  . Hypothyroidism 06/01/2011  . HTN (hypertension) 06/01/2011  . COPD (chronic obstructive pulmonary disease)  06/01/2011  . CAD, CABG '98, cath '08, low risk Myoview Feb 2012, stable cath 12/19/12 with chronically occluded VG to LCX, patent LIMA-LAD & VG to dominant rt.  06/01/2011  . Obesity 06/01/2011  . Depression 06/01/2011  . Leg weakness, bilateral 10/26/2010  . Stiffness of joints, not elsewhere classified, multiple sites 10/26/2010  . Back pain, chronic, prior back surg 10/20/2010   Ihor Austin, LPTA; [place]  Aldona Lento 11/10/2014, 9:26 AM  [place], [place], [address] Phone: [phone]   Fax:  [phone]

## 2014-11-12 ENCOUNTER — Encounter (HOSPITAL_COMMUNITY): Payer: Medicare Other | Admitting: Physical Therapy

## 2014-11-13 ENCOUNTER — Ambulatory Visit (HOSPITAL_COMMUNITY): Payer: Medicare Other | Admitting: Physical Therapy

## 2014-11-13 DIAGNOSIS — Z7409 Other reduced mobility: Secondary | ICD-10-CM

## 2014-11-13 DIAGNOSIS — Z9181 History of falling: Secondary | ICD-10-CM

## 2014-11-13 DIAGNOSIS — M545 Low back pain, unspecified: Secondary | ICD-10-CM

## 2014-11-13 DIAGNOSIS — R29898 Other symptoms and signs involving the musculoskeletal system: Secondary | ICD-10-CM

## 2014-11-13 NOTE — Therapy (Signed)
[place], [place], [address] Phone: [phone]   Fax:  [phone]  Physical Therapy Treatment  Patient Details  Name: [name] MRN: [id number] Date of Birth: November 15, 1933 Referring Provider:  Erline Levine, MD  Encounter Date: 11/13/2014      PT End of Session - 11/13/14 1000    Visit Number 7   Number of Visits 10   Date for PT Re-Evaluation 11/20/14   Authorization Type Medicare   Authorization - Visit Number 7   Authorization - Number of Visits 10   PT Start Time 0850   PT Stop Time 0945   PT Time Calculation (min) 55 min   Activity Tolerance Patient tolerated treatment well   Behavior During Therapy [facility] for tasks assessed/performed      Past Medical History  Diagnosis Date  . Diabetes mellitus type II   . Hypothyroidism   . Hypertension   . GERD (gastroesophageal reflux disease)   . Angina   . CHF (congestive heart failure)     diastolic  . Heart attack   . COPD (chronic obstructive pulmonary disease)   . Coronary artery disease     CABG '98, cath '08, low risk Nuc 3/14  . Obesity   . PVD (peripheral vascular disease)     Lt vertebral stent '98  . Diastolic dysfunction     grade 1 with an EF of 60-65% 4/13  . OSA on CPAP   . High cholesterol   . Chronic bronchitis     "usually get it q yr" (12/17/2012)  . Exertional shortness of breath   . Arthritis     "knees, hips" (12/17/2012)  . Gout     "ankles, legs" (12/17/2012)  . Pneumonia 05/2011  . CAD, CABG '98, cath '08, low risk Myoview Feb 2012, stable cath 12/19/12 with chronically occluded VG to LCX, patent LIMA-LAD & VG to dominant rt.  06/01/2011    Patent LIMA-LAD, oclluded SVG-CFX, patent SVG-RCA in 2008   . Bradycardia, to 30 12/20/2012  . Nephrolithiasis   . CKD (chronic kidney disease) stage 3, GFR 30-59 ml/min 12/20/2012    Past Surgical History  Procedure Laterality Date  . Back surgery    . Replantation finger Right  2000    "little finger" (12/17/2012)  . Cholecystectomy    . Tonsillectomy  ?1960's  . Knee arthroscopy Left 1990's  . Lumbar laminectomy/decompression microdiscectomy  04/11/2011    Procedure: LUMBAR LAMINECTOMY/DECOMPRESSION MICRODISCECTOMY 1 LEVEL;  Surgeon: Peggyann Shoals, MD;  Location: [facility];  Service: Neurosurgery;  Laterality: Right;  RIGHT Lumbar Three-Four laminectomy with resection of synovial cyst  . Angioplasty  '99, 00, March 06  . Tibia fracture surgery Right ~ 2012  . Coronary artery bypass graft  04/1996    "CABG X6" (12/17/2012)  . Cataract extraction w/ intraocular lens  implant, bilateral    . Lithotripsy      "once" (12/17/2012)  . Cardiac catheterization  April 2006; 12/19/2012  . Coronary angioplasty with stent placement      "I've had a total of 3-4 stents put in" (12/17/2012)  . Fracture surgery    . Colonoscopy  08/09/2004    [id number] diverticula.  Remainder of colonic mucosa appeared normal/normal rectum  . Vertiberal artery stent      1998, 99% blocked.  . Colonoscopy N/A 07/31/2013    [id number] colonic polyp-removed as described above. Status post segmental biopsy to evaluate for microscopic colitis. bx  tubular adenoma, asc colon folcal active colitis/lyphoid aggregates (nonspecific)  . Left heart catheterization with coronary angiogram N/A 12/19/2012    Procedure: LEFT HEART CATHETERIZATION WITH CORONARY ANGIOGRAM;  Surgeon: Lorretta Harp, MD;  Location: [facility] CATH LAB;  Service: Cardiovascular;  Laterality: N/A;    There were no vitals filed for this visit.  Visit Diagnosis:  Risk for falls  Bilateral low back pain without sciatica  Weakness of both legs  Impaired functional mobility and activity tolerance      Subjective Assessment - 11/13/14 0850    Subjective Pt states he's not feeling great today but not really hurting at the moment.  STates he had alot of pain after last session and believes he did too much standing actvities without  use of UE's.    Currently in Pain? No/denies                         [facility] Adult PT Treatment/Exercise - 11/13/14 0916    Lumbar Exercises: Aerobic   Stationary Bike Nustep Level 3 with UE and LE   Lumbar Exercises: Standing   Heel Raises 15 reps   Heel Raises Limitations toeraises 10 reps each individually   Functional Squats 10 reps   Functional Squats Limitations at parallel bars   Forward Lunge 10 reps   Forward Lunge Limitations 4 inch box    Other Standing Lumbar Exercises Postural strengthening rows and shoulder extension with GTB 10x   Lumbar Exercises: Supine   Ab Set 10 reps   Clam 10 reps   Bent Knee Raise 10 reps;5 seconds   Bridge 10 reps   Straight Leg Raise 10 reps   Lumbar Exercises: Sidelying   Clam 15 reps   Clam Limitations each side                   PT Short Term Goals - 10/22/14 1656    PT SHORT TERM GOAL #1   Title Pt will be independent with HEP.    Time 2   Period Weeks   Status New   PT SHORT TERM GOAL #2   Title Pt will complete five time sit to stand with UE support in 35 seconds to demonstrate increased BLE strength.    Time 2   Period Weeks   Status New   PT SHORT TERM GOAL #3   Title Pt will score 24 or greater on Berg to demonstrate decreased fall risk.    Time 2   Period Weeks   Status New           PT Long Term Goals - 10/22/14 1659    PT LONG TERM GOAL #1   Title Pt will be indendent with advanced HEP.    Time 4   Period Weeks   Status New   PT LONG TERM GOAL #2   Title Pt will complete five time sit to stand without UE support in 30 seconds or less to demonstrate improved BLE strength.    Time 4   Period Weeks   Status New   PT LONG TERM GOAL #3   Title Pt will score 40 or greater on Berg to demonstrate decreased risk for falls.    Time 4   Period Weeks   Status New   PT LONG TERM GOAL #4   Title Pt will ascend/descend 8 stairs with reciprocal pattern and no LOB to demonstrate improved  safety.    Time 4  Period Weeks   Status New               Plan - 11/13/14 1001    Clinical Impression Statement Pt wtih c/o increased pain and dysfunction after last session.  Refused to continue with sit to stand actvitiy today.  Decreased reps of therex and allowed use of UE's as needed.  Resumed nustep this session.  Pt continues to require re-direction to remain on task during therapy.     PT Next Visit Plan Continue with core strengthening, balance training. Increase reps and exercise difficulty as able.        Problem List Patient Active Problem List   Diagnosis Date Noted  . GERD (gastroesophageal reflux disease) 12/25/2013  . Anemia of chronic disease 12/25/2013  . Constipation 10/27/2013  . Other malaise and fatigue 10/27/2013  . Unspecified hypothyroidism 10/27/2013  . Bowel habit changes 07/16/2013  . Flatulence 07/16/2013  . Occipital pain 04/07/2013  . Hyperlipidemia 01/06/2013  . Angina effort, wtih positive stress test 12/20/2012  . CKD (chronic kidney disease) stage 3, GFR 30-59 ml/min 12/20/2012  . Bradycardia, to 30 12/20/2012  . Weak 12/17/2012  . Dizziness 12/17/2012  . Exertional angina-  12/09/2012  . Dyspnea 06/09/2011  . Sleep apnea, on C-pap 06/09/2011  . PVD, Lt Vertebral artery PTA in '08, known moderate ICA disease 06/09/2011  . Chronic diastolic heart failure 51/89/8421  . UTI (lower urinary tract infection) 06/01/2011  . DM (diabetes mellitus) 06/01/2011  . Hypothyroidism 06/01/2011  . HTN (hypertension) 06/01/2011  . COPD (chronic obstructive pulmonary disease) 06/01/2011  . CAD, CABG '98, cath '08, low risk Myoview Feb 2012, stable cath 12/19/12 with chronically occluded VG to LCX, patent LIMA-LAD & VG to dominant rt.  06/01/2011  . Obesity 06/01/2011  . Depression 06/01/2011  . Leg weakness, bilateral 10/26/2010  . Stiffness of joints, not elsewhere classified, multiple sites 10/26/2010  . Back pain, chronic, prior back surg  10/20/2010    Teena Irani, PTA/CLT [phone]  11/13/2014, 10:15 AM  [place], [place], [address] Phone: [phone]   Fax:  [phone]

## 2014-11-16 ENCOUNTER — Encounter: Payer: Self-pay | Admitting: Internal Medicine

## 2014-11-17 ENCOUNTER — Ambulatory Visit (HOSPITAL_COMMUNITY): Payer: Medicare Other | Admitting: Physical Therapy

## 2014-11-17 DIAGNOSIS — Z9181 History of falling: Secondary | ICD-10-CM | POA: Diagnosis not present

## 2014-11-17 DIAGNOSIS — M545 Low back pain, unspecified: Secondary | ICD-10-CM

## 2014-11-17 DIAGNOSIS — R29898 Other symptoms and signs involving the musculoskeletal system: Secondary | ICD-10-CM

## 2014-11-17 DIAGNOSIS — Z7409 Other reduced mobility: Secondary | ICD-10-CM

## 2014-11-17 NOTE — Therapy (Signed)
[name], [place], [address] Phone: [phone]   Fax:  [phone]  Physical Therapy Treatment  Patient Details  Name: [name] MRN: [id number] Date of Birth: 03-24-1933 Referring Provider:  Erline Levine, MD  Encounter Date: 11/17/2014      PT End of Session - 11/17/14 1050    Visit Number 8   Number of Visits 10   Date for PT Re-Evaluation 11/20/14   Authorization Type Medicare   Authorization - Visit Number 8   Authorization - Number of Visits 10   PT Start Time 0940   PT Stop Time 1037   PT Time Calculation (min) 57 min   Activity Tolerance Patient tolerated treatment well   Behavior During Therapy [facility] for tasks assessed/performed      Past Medical History  Diagnosis Date  . Diabetes mellitus type II   . Hypothyroidism   . Hypertension   . GERD (gastroesophageal reflux disease)   . Angina   . CHF (congestive heart failure)     diastolic  . Heart attack   . COPD (chronic obstructive pulmonary disease)   . Coronary artery disease     CABG '98, cath '08, low risk Nuc 3/14  . Obesity   . PVD (peripheral vascular disease)     Lt vertebral stent '98  . Diastolic dysfunction     grade 1 with an EF of 60-65% 4/13  . OSA on CPAP   . High cholesterol   . Chronic bronchitis     "usually get it q yr" (12/17/2012)  . Exertional shortness of breath   . Arthritis     "knees, hips" (12/17/2012)  . Gout     "ankles, legs" (12/17/2012)  . Pneumonia 05/2011  . CAD, CABG '98, cath '08, low risk Myoview Feb 2012, stable cath 12/19/12 with chronically occluded VG to LCX, patent LIMA-LAD & VG to dominant rt.  06/01/2011    Patent LIMA-LAD, oclluded SVG-CFX, patent SVG-RCA in 2008   . Bradycardia, to 30 12/20/2012  . Nephrolithiasis   . CKD (chronic kidney disease) stage 3, GFR 30-59 ml/min 12/20/2012    Past Surgical History  Procedure Laterality Date  . Back surgery    . Replantation finger Right  2000    "little finger" (12/17/2012)  . Cholecystectomy    . Tonsillectomy  ?1960's  . Knee arthroscopy Left 1990's  . Lumbar laminectomy/decompression microdiscectomy  04/11/2011    Procedure: LUMBAR LAMINECTOMY/DECOMPRESSION MICRODISCECTOMY 1 LEVEL;  Surgeon: Peggyann Shoals, MD;  Location: [facility];  Service: Neurosurgery;  Laterality: Right;  RIGHT Lumbar Three-Four laminectomy with resection of synovial cyst  . Angioplasty  '99, 00, March 06  . Tibia fracture surgery Right ~ 2012  . Coronary artery bypass graft  04/1996    "CABG X6" (12/17/2012)  . Cataract extraction w/ intraocular lens  implant, bilateral    . Lithotripsy      "once" (12/17/2012)  . Cardiac catheterization  April 2006; 12/19/2012  . Coronary angioplasty with stent placement      "I've had a total of 3-4 stents put in" (12/17/2012)  . Fracture surgery    . Colonoscopy  08/09/2004    [id number] diverticula.  Remainder of colonic mucosa appeared normal/normal rectum  . Vertiberal artery stent      1998, 99% blocked.  . Colonoscopy N/A 07/31/2013    [id number] colonic polyp-removed as described above. Status post segmental biopsy to evaluate for microscopic colitis. bx  tubular adenoma, asc colon folcal active colitis/lyphoid aggregates (nonspecific)  . Left heart catheterization with coronary angiogram N/A 12/19/2012    Procedure: LEFT HEART CATHETERIZATION WITH CORONARY ANGIOGRAM;  Surgeon: Lorretta Harp, MD;  Location: [facility] CATH LAB;  Service: Cardiovascular;  Laterality: N/A;    There were no vitals filed for this visit.  Visit Diagnosis:  Risk for falls  Bilateral low back pain without sciatica  Weakness of both legs  Impaired functional mobility and activity tolerance      Subjective Assessment - 11/17/14 1052    Subjective Pt worked into schedule today and was 10 minutes late.  STates he is not having pain but has been increasing his activity at home.    Currently in Pain? No/denies                          [facility] Adult PT Treatment/Exercise - 11/17/14 0958    Lumbar Exercises: Aerobic   Stationary Bike Nustep 10' Level 3 with UE and LE   Lumbar Exercises: Standing   Heel Raises 15 reps   Heel Raises Limitations toeraises 10 reps each individually   Functional Squats 15 reps   Functional Squats Limitations at parallel bars   Forward Lunge 15 reps   Forward Lunge Limitations 4 inch box    Other Standing Lumbar Exercises Postural strengthening rows and shoulder extension with GTB 10x   Other Standing Lumbar Exercises hip abduction and extension 10 reps each             Balance Exercises - 11/17/14 1018    Balance Exercises: Standing   SLS 30 secs;Upper extremity support 1   Standing, One Foot on a Step 4 inch;2 reps;15 secs             PT Short Term Goals - 10/22/14 1656    PT SHORT TERM GOAL #1   Title Pt will be independent with HEP.    Time 2   Period Weeks   Status New   PT SHORT TERM GOAL #2   Title Pt will complete five time sit to stand with UE support in 35 seconds to demonstrate increased BLE strength.    Time 2   Period Weeks   Status New   PT SHORT TERM GOAL #3   Title Pt will score 24 or greater on Berg to demonstrate decreased fall risk.    Time 2   Period Weeks   Status New           PT Long Term Goals - 10/22/14 1659    PT LONG TERM GOAL #1   Title Pt will be indendent with advanced HEP.    Time 4   Period Weeks   Status New   PT LONG TERM GOAL #2   Title Pt will complete five time sit to stand without UE support in 30 seconds or less to demonstrate improved BLE strength.    Time 4   Period Weeks   Status New   PT LONG TERM GOAL #3   Title Pt will score 40 or greater on Berg to demonstrate decreased risk for falls.    Time 4   Period Weeks   Status New   PT LONG TERM GOAL #4   Title Pt will ascend/descend 8 stairs with reciprocal pattern and no LOB to demonstrate improved safety.    Time 4    Period Weeks   Status New  Plan - 11/17/14 1052    Clinical Impression Statement Focused session on increasing standing activity, stabiliy and balance.  Added SLS today, however unable to complete without 1 HHA.  Completed 30 seconds on each LE with noted challenge.  Added standing hip extension and abduction as well.  Instructed patient to continue his supine exericses with HEP.  PT completed session today with 2 short seated rest breaks.  Less re-direction needed today.    PT Next Visit Plan Continue with core strengthening, balance training. Increase reps and exercise difficulty as able.  Add tandem stance and challenge balance actvities.  Re-eval and gcodes due 9/23.        Problem List Patient Active Problem List   Diagnosis Date Noted  . GERD (gastroesophageal reflux disease) 12/25/2013  . Anemia of chronic disease 12/25/2013  . Constipation 10/27/2013  . Other malaise and fatigue 10/27/2013  . Unspecified hypothyroidism 10/27/2013  . Bowel habit changes 07/16/2013  . Flatulence 07/16/2013  . Occipital pain 04/07/2013  . Hyperlipidemia 01/06/2013  . Angina effort, wtih positive stress test 12/20/2012  . CKD (chronic kidney disease) stage 3, GFR 30-59 ml/min 12/20/2012  . Bradycardia, to 30 12/20/2012  . Weak 12/17/2012  . Dizziness 12/17/2012  . Exertional angina-  12/09/2012  . Dyspnea 06/09/2011  . Sleep apnea, on C-pap 06/09/2011  . PVD, Lt Vertebral artery PTA in '08, known moderate ICA disease 06/09/2011  . Chronic diastolic heart failure 74/25/9563  . UTI (lower urinary tract infection) 06/01/2011  . DM (diabetes mellitus) 06/01/2011  . Hypothyroidism 06/01/2011  . HTN (hypertension) 06/01/2011  . COPD (chronic obstructive pulmonary disease) 06/01/2011  . CAD, CABG '98, cath '08, low risk Myoview Feb 2012, stable cath 12/19/12 with chronically occluded VG to LCX, patent LIMA-LAD & VG to dominant rt.  06/01/2011  . Obesity 06/01/2011  .  Depression 06/01/2011  . Leg weakness, bilateral 10/26/2010  . Stiffness of joints, not elsewhere classified, multiple sites 10/26/2010  . Back pain, chronic, prior back surg 10/20/2010    Teena Irani, PTA/CLT [phone] 11/17/2014, 10:58 AM  [place], [place], [address] Phone: [phone]   Fax:  [phone]

## 2014-11-19 ENCOUNTER — Other Ambulatory Visit: Payer: Self-pay | Admitting: Cardiovascular Disease

## 2014-11-20 ENCOUNTER — Ambulatory Visit (HOSPITAL_COMMUNITY): Payer: Medicare Other

## 2014-11-20 DIAGNOSIS — R29898 Other symptoms and signs involving the musculoskeletal system: Secondary | ICD-10-CM

## 2014-11-20 DIAGNOSIS — M545 Low back pain, unspecified: Secondary | ICD-10-CM

## 2014-11-20 DIAGNOSIS — Z7409 Other reduced mobility: Secondary | ICD-10-CM

## 2014-11-20 DIAGNOSIS — Z9181 History of falling: Secondary | ICD-10-CM | POA: Diagnosis not present

## 2014-11-20 NOTE — Therapy (Signed)
[place], [place], [address] Phone: [phone]   Fax:  [phone]  Physical Therapy Treatment  Patient Details  Name: GINA COSTILLA MRN: [id number] Date of Birth: 1933/11/25 Referring Provider:  Sinda Du, MD  Encounter Date: 11/20/2014      PT End of Session - 11/20/14 1220    Visit Number 9   Number of Visits 10   Date for PT Re-Evaluation 11/20/14   Authorization Type Medicare   Authorization Time Period G-codes done 2022-04-28 visit    Authorization - Visit Number 9   Authorization - Number of Visits 10   PT Start Time [phone]   PT Stop Time 1020   PT Time Calculation (min) 44 min   Activity Tolerance Patient tolerated treatment well;Patient limited by fatigue;No increased pain   Behavior During Therapy [facility] for tasks assessed/performed      Past Medical History  Diagnosis Date  . Diabetes mellitus type II   . Hypothyroidism   . Hypertension   . GERD (gastroesophageal reflux disease)   . Angina   . CHF (congestive heart failure)     diastolic  . Heart attack   . COPD (chronic obstructive pulmonary disease)   . Coronary artery disease     CABG '98, cath '08, low risk Nuc 3/14  . Obesity   . PVD (peripheral vascular disease)     Lt vertebral stent '98  . Diastolic dysfunction     grade 1 with an EF of 60-65% 4/13  . OSA on CPAP   . High cholesterol   . Chronic bronchitis     "usually get it q yr" (12/17/2012)  . Exertional shortness of breath   . Arthritis     "knees, hips" (12/17/2012)  . Gout     "ankles, legs" (12/17/2012)  . Pneumonia 05/2011  . CAD, CABG '98, cath '08, low risk Myoview Feb 2012, stable cath 12/19/12 with chronically occluded VG to LCX, patent LIMA-LAD & VG to dominant rt.  06/01/2011    Patent LIMA-LAD, oclluded SVG-CFX, patent SVG-RCA in 2008   . Bradycardia, to 30 12/20/2012  . Nephrolithiasis   . CKD (chronic kidney disease) stage 3, GFR 30-59 ml/min 12/20/2012     Past Surgical History  Procedure Laterality Date  . Back surgery    . Replantation finger Right 2000    "little finger" (12/17/2012)  . Cholecystectomy    . Tonsillectomy  ?1960's  . Knee arthroscopy Left 1990's  . Lumbar laminectomy/decompression microdiscectomy  04/11/2011    Procedure: LUMBAR LAMINECTOMY/DECOMPRESSION MICRODISCECTOMY 1 LEVEL;  Surgeon: Peggyann Shoals, MD;  Location: [place] [facility];  Service: Neurosurgery;  Laterality: Right;  RIGHT Lumbar Three-Four laminectomy with resection of synovial cyst  . Angioplasty  '99, 00, March 06  . Tibia fracture surgery Right ~ 2012  . Coronary artery bypass graft  04/1996    "CABG X6" (12/17/2012)  . Cataract extraction w/ intraocular lens  implant, bilateral    . Lithotripsy      "once" (12/17/2012)  . Cardiac catheterization  April 2006; 12/19/2012  . Coronary angioplasty with stent placement      "I've had a total of 3-4 stents put in" (12/17/2012)  . Fracture surgery    . Colonoscopy  08/09/2004    ERD:[id number] diverticula.  Remainder of colonic mucosa appeared normal/normal rectum  . Vertiberal artery stent      1998, 99% blocked.  . Colonoscopy N/A 07/31/2013    JEH:UDJSHF  colonic polyp-removed as described above. Status post segmental biopsy to evaluate for microscopic colitis. bx tubular adenoma, asc colon folcal active colitis/lyphoid aggregates (nonspecific)  . Left heart catheterization with coronary angiogram N/A 12/19/2012    Procedure: LEFT HEART CATHETERIZATION WITH CORONARY ANGIOGRAM;  Surgeon: Lorretta Harp, MD;  Location: [facility] C[facility];  Service: Cardiovascular;  Laterality: N/A;    There were no vitals filed for this visit.  Visit Diagnosis:  Risk for falls  Bilateral low back pain without sciatica  Weakness of both legs  Impaired functional mobility and activity tolerance      Subjective Assessment - 11/20/14 0943    Subjective Pt reports he is feeling sore all over today, but about average.     Patient Stated Goals improve strength, improve mobility            [facility] PT Assessment - 11/20/14 0001    Assessment   Medical Diagnosis LBP   Next MD Visit none scheduled   Prior Therapy yes-over a year ago   [place] residence   Living Arrangements Alone   Type of [place] to enter   Entrance Stairs-Number of Steps 3   Entrance Stairs-Rails Right   Home Layout One level   Prior Function   Level of [place] Retired   Observation/Other Assessments   Focus on Therapeutic Outcomes (FOTO)  44% limited   Posture/Postural Control   Posture Comments Forward flexed, mild thoracic kyphosis   AROM   Lumbar Flexion --   Lumbar Extension --   Lumbar - Right Side Bend --   Lumbar - Left Side Bend --   PROM   Right Hip External Rotation  --   Right Hip Internal Rotation  --   Left Hip Extension --   Left Hip Flexion --   Strength   Right Hip Flexion 3+/5   Right Hip Extension --   Right Hip ABduction 4/5   Left Hip Flexion 4/5   Left Hip Extension --   Left Hip ABduction 4/5   Right Knee Flexion 4/5   Right Knee Extension 4+/5   Left Knee Flexion 4/5   Left Knee Extension 4+/5   Right Ankle Dorsiflexion 4+/5   Left Ankle Dorsiflexion 4+/5   Transfers   Five time sit to stand comments  26  utilizing BUE   Standardized Balance Assessment   Standardized Balance Assessment Berg Balance Test   Berg Balance Test   Sit to Stand Able to stand without using hands and stabilize independently   Standing Unsupported Able to stand safely 2 minutes   Sitting with Back Unsupported but Feet Supported on Floor or Stool Able to sit safely and securely 2 minutes   Stand to Sit Sits safely with minimal use of hands   Transfers Able to transfer safely, minor use of hands   Standing Unsupported with Eyes Closed Able to stand 3 seconds   Standing Ubsupported with Feet Together Needs help to attain  position and unable to hold for 15 seconds   From Standing, Reach Forward with Outstretched Arm Can reach forward >5 cm safely (2")   From Standing Position, Pick up Object from Floor Unable to pick up and needs supervision   From Standing Position, Turn to Look Behind Over each Shoulder Turn sideways only but maintains balance   Turn 360 Degrees Needs assistance while turning  LOB, very dizzy turning clockwise  Standing Unsupported, Alternately Place Feet on Step/Stool Able to complete >2 steps/needs minimal assist   Standing Unsupported, One Foot in [employer] balance while stepping or standing   Standing on One Leg Unable to try or needs assist to prevent fall   Total Score 28                     [facility] Adult PT Treatment/Exercise - 11/20/14 0001    Lumbar Exercises: Standing   Heel Raises 15 reps   Heel Raises Limitations toeraises 10 reps each individually   Functional Squats 15 reps   Functional Squats Limitations STS airex on chair, CGA   Other Standing Lumbar Exercises Postural strengthening rows x10 (blue) and shoulder extension with RTB 15x   Other Standing Lumbar Exercises hip abduction 2x10; hip extension 2x10    Lumbar Exercises: Seated   Sit to Stand Other (comment)  2x15 airex on chair (see above)                PT Education - 11/20/14 1003    Education provided No          PT Short Term Goals - 11/20/14 1003    PT SHORT TERM GOAL #1   Title Pt will be independent with HEP.    Time 2   Period Weeks   Status Partially Met   PT SHORT TERM GOAL #2   Title Pt will complete five time sit to stand with UE support in 35 seconds to demonstrate increased BLE strength.    Time 2   Period Weeks   Status Achieved   PT SHORT TERM GOAL #3   Title Pt will score 24 or greater on Berg to demonstrate decreased fall risk.    Baseline 18 --> 28   Time 2   Period Weeks   Status New           PT Long Term Goals - 11/20/14 1219    PT LONG TERM  GOAL #1   Title Pt will be indendent with advanced HEP.    Time 4   Period Weeks   Status On-going   PT LONG TERM GOAL #2   Title Pt will complete five time sit to stand without UE support in 30 seconds or less to demonstrate improved BLE strength.    Time 4   Period Weeks   Status On-going   PT LONG TERM GOAL #3   Title Pt will score 40 or greater on Berg to demonstrate decreased risk for falls.    Time 4   Period Weeks   Status On-going   PT LONG TERM GOAL #4   Title Pt will ascend/descend 8 stairs with reciprocal pattern and no LOB to demonstrate improved safety.    Time 4   Period Weeks   Status Achieved               Plan - 11/20/14 1221    Clinical Impression Statement Pt tolerated session well, easily distracted, requiring heavy cues to keep on task, with overall a great performance. Maintained consistency of exercises today in session, with greater emphasis on posture and form throughout. Pt is demonstrating progress toward goals eviden tin improved balance, activity tolerance, and strength. Pt should continue to drive progress to most difficult areas, iincluding postural improvements and balance.    Pt will benefit from skilled therapeutic intervention in order to improve on the following deficits Abnormal gait;Decreased activity tolerance;Decreased balance;Decreased endurance;Decreased range of motion;Decreased  strength;Difficulty walking;Pain   Rehab Potential Fair   Clinical Impairments Affecting Rehab Potential Pt morbidly obese, hx of long standing back pain.    PT Frequency 2x / week   PT Duration 4 weeks   PT Treatment/Interventions ADLs/Self Care Home Management;Gait training;Stair training;Functional mobility training;Therapeutic activities;Therapeutic exercise;Balance training;Neuromuscular re-education;Patient/family education;Manual techniques   PT Next Visit Plan Continue with core strengthening, balance training. Increase reps and exercise difficulty as  able.  Add tandem stance and challenge balance actvities.  Re-eval and gcodes due 12-18-22.   PT Home Exercise Plan no changes this session.    Consulted and Agree with Plan of Care Patient          G-Codes - 18-Dec-2014 1227    Functional Assessment Tool Used FOTO   Functional Limitation Mobility: Walking and moving around   Mobility: Walking and Moving Around Current Status [phone]) At least 40 percent but less than 60 percent impaired, limited or restricted   Mobility: Walking and Moving Around Goal Status [phone]) At least 40 percent but less than 60 percent impaired, limited or restricted      Problem List Patient Active Problem List   Diagnosis Date Noted  . GERD (gastroesophageal reflux disease) 12/25/2013  . Anemia of chronic disease 12/25/2013  . Constipation 10/27/2013  . Other malaise and fatigue 10/27/2013  . Unspecified hypothyroidism 10/27/2013  . Bowel habit changes 07/16/2013  . Flatulence 07/16/2013  . Occipital pain 04/07/2013  . Hyperlipidemia 01/06/2013  . Angina effort, wtih positive stress test 12/20/2012  . CKD (chronic kidney disease) stage 3, GFR 30-59 ml/min 12/20/2012  . Bradycardia, to 30 12/20/2012  . Weak 12/17/2012  . Dizziness 12/17/2012  . Exertional angina-  12/09/2012  . Dyspnea 06/09/2011  . Sleep apnea, on C-pap 06/09/2011  . PVD, Lt Vertebral artery PTA in '08, known moderate ICA disease 06/09/2011  . Chronic diastolic heart failure 64/68/0321  . UTI (lower urinary tract infection) 06/01/2011  . DM (diabetes mellitus) 06/01/2011  . Hypothyroidism 06/01/2011  . HTN (hypertension) 06/01/2011  . COPD (chronic obstructive pulmonary disease) 06/01/2011  . CAD, CABG '98, cath '08, low risk Myoview Feb 2012, stable cath 12/19/12 with chronically occluded VG to LCX, patent LIMA-LAD & VG to dominant rt.  06/01/2011  . Obesity 06/01/2011  . Depression 06/01/2011  . Leg weakness, bilateral 10/26/2010  . Stiffness of joints, not elsewhere classified,  multiple sites 10/26/2010  . Back pain, chronic, prior back surg 10/20/2010    Physical Therapy Progress Note  Dates of Reporting Period: 10/22/14 to December 18, 2014  Objective Reports of Subjective Statement: *see above   Objective Measurements: *see above   Goal Update: *see above  Plan: *see above  Reason Skilled Services are Required: Pt has not made all goals at this time, still making progress.     Buccola,Allan C 12/18/14, 12:28 PM  12:29 PM  Etta Grandchild, PT, DPT [place] License # [id number]       [facility] [address] [place], [place], [address] Phone: [phone]   Fax:  [phone]

## 2014-11-24 ENCOUNTER — Ambulatory Visit (HOSPITAL_COMMUNITY): Payer: Medicare Other | Admitting: Physical Therapy

## 2014-11-24 DIAGNOSIS — Z9181 History of falling: Secondary | ICD-10-CM | POA: Diagnosis not present

## 2014-11-24 DIAGNOSIS — M545 Low back pain, unspecified: Secondary | ICD-10-CM

## 2014-11-24 DIAGNOSIS — R29898 Other symptoms and signs involving the musculoskeletal system: Secondary | ICD-10-CM

## 2014-11-24 NOTE — Therapy (Signed)
[place], [place], [address] Phone: [phone]   Fax:  [phone]  Physical Therapy Treatment  Patient Details  Name: [name] MRN: [id number] Date of Birth: Jul 27, 1933 Referring Provider:  Erline Levine, MD  Encounter Date: 11/24/2014      PT End of Session - 11/24/14 1058    Visit Number 10   Number of Visits 15   Date for PT Re-Evaluation 12/18/14   Authorization Type Medicare   Authorization Time Period G-codes done April 20, 2022 visit    Authorization - Visit Number 10   Authorization - Number of Visits 19   PT Start Time 0350   PT Stop Time 1059   PT Time Calculation (min) 44 min   Activity Tolerance Patient tolerated treatment well   Behavior During Therapy [facility] for tasks assessed/performed      Past Medical History  Diagnosis Date  . Diabetes mellitus type II   . Hypothyroidism   . Hypertension   . GERD (gastroesophageal reflux disease)   . Angina   . CHF (congestive heart failure)     diastolic  . Heart attack   . COPD (chronic obstructive pulmonary disease)   . Coronary artery disease     CABG '98, cath '08, low risk Nuc 3/14  . Obesity   . PVD (peripheral vascular disease)     Lt vertebral stent '98  . Diastolic dysfunction     grade 1 with an EF of 60-65% 4/13  . OSA on CPAP   . High cholesterol   . Chronic bronchitis     "usually get it q yr" (12/17/2012)  . Exertional shortness of breath   . Arthritis     "knees, hips" (12/17/2012)  . Gout     "ankles, legs" (12/17/2012)  . Pneumonia 05/2011  . CAD, CABG '98, cath '08, low risk Myoview Feb 2012, stable cath 12/19/12 with chronically occluded VG to LCX, patent LIMA-LAD & VG to dominant rt.  06/01/2011    Patent LIMA-LAD, oclluded SVG-CFX, patent SVG-RCA in 2008   . Bradycardia, to 30 12/20/2012  . Nephrolithiasis   . CKD (chronic kidney disease) stage 3, GFR 30-59 ml/min 12/20/2012    Past Surgical History  Procedure  Laterality Date  . Back surgery    . Replantation finger Right 2000    "little finger" (12/17/2012)  . Cholecystectomy    . Tonsillectomy  ?1960's  . Knee arthroscopy Left 1990's  . Lumbar laminectomy/decompression microdiscectomy  04/11/2011    Procedure: LUMBAR LAMINECTOMY/DECOMPRESSION MICRODISCECTOMY 1 LEVEL;  Surgeon: Peggyann Shoals, MD;  Location: [place] [facility];  Service: Neurosurgery;  Laterality: Right;  RIGHT Lumbar Three-Four laminectomy with resection of synovial cyst  . Angioplasty  '99, 00, March 06  . Tibia fracture surgery Right ~ 2012  . Coronary artery bypass graft  04/1996    "CABG X6" (12/17/2012)  . Cataract extraction w/ intraocular lens  implant, bilateral    . Lithotripsy      "once" (12/17/2012)  . Cardiac catheterization  April 2006; 12/19/2012  . Coronary angioplasty with stent placement      "I've had a total of 3-4 stents put in" (12/17/2012)  . Fracture surgery    . Colonoscopy  08/09/2004    KXF:[id number] diverticula.  Remainder of colonic mucosa appeared normal/normal rectum  . Vertiberal artery stent      1998, 99% blocked.  . Colonoscopy N/A 07/31/2013    ZJI:RCVELF colonic polyp-removed as described above.  Status post segmental biopsy to evaluate for microscopic colitis. bx tubular adenoma, asc colon folcal active colitis/lyphoid aggregates (nonspecific)  . Left heart catheterization with coronary angiogram N/A 12/19/2012    Procedure: LEFT HEART CATHETERIZATION WITH CORONARY ANGIOGRAM;  Surgeon: Lorretta Harp, MD;  Location: [facility] CATH LAB;  Service: Cardiovascular;  Laterality: N/A;    There were no vitals filed for this visit.  Visit Diagnosis:  Risk for falls  Bilateral low back pain without sciatica  Weakness of both legs      Subjective Assessment - 11/24/14 1152    Subjective Pt denies having any pain today.    Currently in Pain? No/denies   Pain Score 0-No pain               [facility] Adult PT Treatment/Exercise - 11/24/14 0001     Lumbar Exercises: Aerobic   Stationary Bike Nustep 10' Level 3 with UE and LE   Lumbar Exercises: Standing   Heel Raises 15 reps   Heel Raises Limitations toeraises 10 reps each individually   Functional Squats 15 reps   Functional Squats Limitations STS airex on chair, CGA   Forward Lunge 15 reps   Forward Lunge Limitations 4 inch box    Other Standing Lumbar Exercises hip abduction 2x10; hip extension 2x10              Balance Exercises - 11/24/14 1047    Balance Exercises: Standing   Standing, One Foot on a Step 8 inch;2 reps;30 secs   Rockerboard Other time (comment);UE support  2 minutes             PT Short Term Goals - 11/20/14 1003    PT SHORT TERM GOAL #1   Title Pt will be independent with HEP.    Time 2   Period Weeks   Status Partially Met   PT SHORT TERM GOAL #2   Title Pt will complete five time sit to stand with UE support in 35 seconds to demonstrate increased BLE strength.    Time 2   Period Weeks   Status Achieved   PT SHORT TERM GOAL #3   Title Pt will score 24 or greater on Berg to demonstrate decreased fall risk.    Baseline 18 --> 28   Time 2   Period Weeks   Status New           PT Long Term Goals - 11/20/14 1219    PT LONG TERM GOAL #1   Title Pt will be indendent with advanced HEP.    Time 4   Period Weeks   Status On-going   PT LONG TERM GOAL #2   Title Pt will complete five time sit to stand without UE support in 30 seconds or less to demonstrate improved BLE strength.    Time 4   Period Weeks   Status On-going   PT LONG TERM GOAL #3   Title Pt will score 40 or greater on Berg to demonstrate decreased risk for falls.    Time 4   Period Weeks   Status On-going   PT LONG TERM GOAL #4   Title Pt will ascend/descend 8 stairs with reciprocal pattern and no LOB to demonstrate improved safety.    Time 4   Period Weeks   Status Achieved               Plan - 11/24/14 1059    Clinical Impression Statement Treatment  session focused on functional  strengthening and balance training. Pt requires heavy cueing to remain on task throughout treatment. Pt required 3 seated rest breaks in today's treatment d/t c/o fatigue. Pt was able to complete rockerboard and standing with foot on 8" step today with no LOB.    PT Next Visit Plan Progress core strengthening, progress balance activities.         Problem List Patient Active Problem List   Diagnosis Date Noted  . GERD (gastroesophageal reflux disease) 12/25/2013  . Anemia of chronic disease 12/25/2013  . Constipation 10/27/2013  . Other malaise and fatigue 10/27/2013  . Unspecified hypothyroidism 10/27/2013  . Bowel habit changes 07/16/2013  . Flatulence 07/16/2013  . Occipital pain 04/07/2013  . Hyperlipidemia 01/06/2013  . Angina effort, wtih positive stress test 12/20/2012  . CKD (chronic kidney disease) stage 3, GFR 30-59 ml/min 12/20/2012  . Bradycardia, to 30 12/20/2012  . Weak 12/17/2012  . Dizziness 12/17/2012  . Exertional angina-  12/09/2012  . Dyspnea 06/09/2011  . Sleep apnea, on C-pap 06/09/2011  . PVD, Lt Vertebral artery PTA in '08, known moderate ICA disease 06/09/2011  . Chronic diastolic heart failure 91/69/4503  . UTI (lower urinary tract infection) 06/01/2011  . DM (diabetes mellitus) 06/01/2011  . Hypothyroidism 06/01/2011  . HTN (hypertension) 06/01/2011  . COPD (chronic obstructive pulmonary disease) 06/01/2011  . CAD, CABG '98, cath '08, low risk Myoview Feb 2012, stable cath 12/19/12 with chronically occluded VG to LCX, patent LIMA-LAD & VG to dominant rt.  06/01/2011  . Obesity 06/01/2011  . Depression 06/01/2011  . Leg weakness, bilateral 10/26/2010  . Stiffness of joints, not elsewhere classified, multiple sites 10/26/2010  . Back pain, chronic, prior back surg 10/20/2010    Hilma Favors, PT, DPT [phone] 11/24/2014, 11:52 AM  [place], [place], [address] Phone: [phone]   Fax:  [phone]

## 2014-11-27 ENCOUNTER — Ambulatory Visit (HOSPITAL_COMMUNITY): Payer: Medicare Other

## 2014-11-27 DIAGNOSIS — R29898 Other symptoms and signs involving the musculoskeletal system: Secondary | ICD-10-CM

## 2014-11-27 DIAGNOSIS — M545 Low back pain, unspecified: Secondary | ICD-10-CM

## 2014-11-27 DIAGNOSIS — Z9181 History of falling: Secondary | ICD-10-CM

## 2014-11-27 DIAGNOSIS — Z7409 Other reduced mobility: Secondary | ICD-10-CM

## 2014-11-27 NOTE — Therapy (Signed)
[place], [place], [address] Phone: [phone]   Fax:  [phone]  Physical Therapy Treatment  Patient Details  Name: [name] MRN: [id number] Date of Birth: [birth year] Referring Provider:  Erline Levine, MD  Encounter Date: 11/27/2014      PT End of Session - 11/27/14 1057    Visit Number 11   Number of Visits 15   Date for PT Re-Evaluation 12/18/14   Authorization Type Medicare   Authorization Time Period G-codes done 04/09/22 visit    Authorization - Visit Number 11   Authorization - Number of Visits 19   PT Start Time 1018   PT Stop Time 1101   PT Time Calculation (min) 43 min   Activity Tolerance Patient tolerated treatment well;Patient limited by fatigue;Patient limited by pain   Behavior During Therapy [facility] for tasks assessed/performed      Past Medical History  Diagnosis Date  . Diabetes mellitus type II   . Hypothyroidism   . Hypertension   . GERD (gastroesophageal reflux disease)   . Angina   . CHF (congestive heart failure)     diastolic  . Heart attack   . COPD (chronic obstructive pulmonary disease)   . Coronary artery disease     CABG '98, cath '08, low risk Nuc 3/14  . Obesity   . PVD (peripheral vascular disease)     Lt vertebral stent '98  . Diastolic dysfunction     grade 1 with an EF of 60-65% 4/13  . OSA on CPAP   . High cholesterol   . Chronic bronchitis     "usually get it q yr" (12/17/2012)  . Exertional shortness of breath   . Arthritis     "knees, hips" (12/17/2012)  . Gout     "ankles, legs" (12/17/2012)  . Pneumonia 05/2011  . CAD, CABG '98, cath '08, low risk Myoview Feb 2012, stable cath 12/19/12 with chronically occluded VG to LCX, patent LIMA-LAD & VG to dominant rt.  06/01/2011    Patent LIMA-LAD, oclluded SVG-CFX, patent SVG-RCA in 2008   . Bradycardia, to 30 12/20/2012  . Nephrolithiasis   . CKD (chronic kidney disease) stage 3, GFR 30-59 ml/min  12/20/2012    Past Surgical History  Procedure Laterality Date  . Back surgery    . Replantation finger Right 2000    "little finger" (12/17/2012)  . Cholecystectomy    . Tonsillectomy  ?1960's  . Knee arthroscopy Left 1990's  . Lumbar laminectomy/decompression microdiscectomy  04/11/2011    Procedure: LUMBAR LAMINECTOMY/DECOMPRESSION MICRODISCECTOMY 1 LEVEL;  Surgeon: Peggyann Shoals, MD;  Location: [place] [facility];  Service: Neurosurgery;  Laterality: Right;  RIGHT Lumbar Three-Four laminectomy with resection of synovial cyst  . Angioplasty  '99, 00, March 06  . Tibia fracture surgery Right ~ 2012  . Coronary artery bypass graft  04/1996    "CABG X6" (12/17/2012)  . Cataract extraction w/ intraocular lens  implant, bilateral    . Lithotripsy      "once" (12/17/2012)  . Cardiac catheterization  April 2006; 12/19/2012  . Coronary angioplasty with stent placement      "I've had a total of 3-4 stents put in" (12/17/2012)  . Fracture surgery    . Colonoscopy  08/09/2004    [id number] diverticula.  Remainder of colonic mucosa appeared normal/normal rectum  . Vertiberal artery stent      1998, 99% blocked.  . Colonoscopy N/A 07/31/2013  XKG:[id number] colonic polyp-removed as described above. Status post segmental biopsy to evaluate for microscopic colitis. bx tubular adenoma, asc colon folcal active colitis/lyphoid aggregates (nonspecific)  . Left heart catheterization with coronary angiogram N/A 12/19/2012    Procedure: LEFT HEART CATHETERIZATION WITH CORONARY ANGIOGRAM;  Surgeon: Lorretta Harp, MD;  Location: [facility];  Service: Cardiovascular;  Laterality: N/A;    There were no vitals filed for this visit.  Visit Diagnosis:  Risk for falls  Bilateral low back pain without sciatica  Weakness of both legs  Impaired functional mobility and activity tolerance      Subjective Assessment - 11/27/14 1024    Subjective Pt reports hes not feeling well today. Denies pain. has nothing  additional to report at this time.    Patient Stated Goals improve strength, improve mobility   Currently in Pain? No/denies                         [facility] Adult PT Treatment/Exercise - 11/27/14 0001    Ambulation/Gait   Ambulation/Gait Yes   Ambulation/Gait Assistance 5: Supervision   Ambulation Distance (Feet) 398 Feet   Assistive device Straight cane   Gait Pattern --  2 point gait, SPC in R hand.   Ambulation Surface Level;Outdoor;Paved   Gait Comments Resting breaks every ~150 feet due to DOE  Mild ache in shouders, denies angina; HR <90bpm throughout   Lumbar Exercises: Stretches   Single Knee to Chest Stretch 3 reps;30 seconds  bilat   Lower Trunk Rotation Other (comment)  20x bilat bilat knee swings   Piriformis Stretch Limitations Supine high adductor stretch  3x30 seconds   Lumbar Exercises: Standing   Heel Raises 20 reps   Heel Raises Limitations 2x20 bilateral simultaneous   Functional Squats 15 reps;Other (comment)  2x15, hands on knees   Functional Squats Limitations STS airex on chair, CGA   Other Standing Lumbar Exercises hip abduction 2x15; hip extension 2x10                 PT Education - 11/27/14 1055    Education provided Yes   Education Details Educated on how PT will help improve his tolerance to activity, adn decrease severity of DOE.    Person(s) Educated Patient   Methods Explanation   Comprehension Verbalized understanding          PT Short Term Goals - 11/20/14 1003    PT SHORT TERM GOAL #1   Title Pt will be independent with HEP.    Time 2   Period Weeks   Status Partially Met   PT SHORT TERM GOAL #2   Title Pt will complete five time sit to stand with UE support in 35 seconds to demonstrate increased BLE strength.    Time 2   Period Weeks   Status Achieved   PT SHORT TERM GOAL #3   Title Pt will score 24 or greater on Berg to demonstrate decreased fall risk.    Baseline 18 --> 28   Time 2   Period Weeks    Status New           PT Long Term Goals - 11/20/14 1219    PT LONG TERM GOAL #1   Title Pt will be indendent with advanced HEP.    Time 4   Period Weeks   Status On-going   PT LONG TERM GOAL #2   Title Pt will complete five time sit to stand  without UE support in 30 seconds or less to demonstrate improved BLE strength.    Time 4   Period Weeks   Status On-going   PT LONG TERM GOAL #3   Title Pt will score 40 or greater on Berg to demonstrate decreased risk for falls.    Time 4   Period Weeks   Status On-going   PT LONG TERM GOAL #4   Title Pt will ascend/descend 8 stairs with reciprocal pattern and no LOB to demonstrate improved safety.    Time 4   Period Weeks   Status Achieved               Plan - 11/27/14 1059    Clinical Impression Statement Pt tolerated treatment session well, limited mostly by SOB or DOE. Pt making progress toward goals evident in ability to perform some advanced gait training today. Pt continues to be most limited by poor activity tolerance, pain, and DOE.   Pt will benefit from skilled therapeutic intervention in order to improve on the following deficits Abnormal gait;Decreased activity tolerance;Decreased balance;Decreased endurance;Decreased range of motion;Decreased strength;Difficulty walking;Pain   Rehab Potential Fair   Clinical Impairments Affecting Rehab Potential Pt morbidly obese, hx of long standing back pain.    PT Frequency 2x / week   PT Duration 4 weeks   PT Treatment/Interventions ADLs/Self Care Home Management;Gait training;Stair training;Functional mobility training;Therapeutic activities;Therapeutic exercise;Balance training;Neuromuscular re-education;Patient/family education;Manual techniques   PT Next Visit Plan Progress core strengthening, progress balance activities.    PT Home Exercise Plan no changes this session.    Consulted and Agree with Plan of Care Patient        Problem List Patient Active Problem List    Diagnosis Date Noted  . GERD (gastroesophageal reflux disease) 12/25/2013  . Anemia of chronic disease 12/25/2013  . Constipation 10/27/2013  . Other malaise and fatigue 10/27/2013  . Unspecified hypothyroidism 10/27/2013  . Bowel habit changes 07/16/2013  . Flatulence 07/16/2013  . Occipital pain 04/07/2013  . Hyperlipidemia 01/06/2013  . Angina effort, wtih positive stress test 12/20/2012  . CKD (chronic kidney disease) stage 3, GFR 30-59 ml/min 12/20/2012  . Bradycardia, to 30 12/20/2012  . Weak 12/17/2012  . Dizziness 12/17/2012  . Exertional angina-  12/09/2012  . Dyspnea 06/09/2011  . Sleep apnea, on C-pap 06/09/2011  . PVD, Lt Vertebral artery PTA in '08, known moderate ICA disease 06/09/2011  . Chronic diastolic heart failure 38/33/3832  . UTI (lower urinary tract infection) 06/01/2011  . DM (diabetes mellitus) 06/01/2011  . Hypothyroidism 06/01/2011  . HTN (hypertension) 06/01/2011  . COPD (chronic obstructive pulmonary disease) 06/01/2011  . CAD, CABG '98, cath '08, low risk Myoview Feb 2012, stable cath 12/19/12 with chronically occluded VG to LCX, patent LIMA-LAD & VG to dominant rt.  06/01/2011  . Obesity 06/01/2011  . Depression 06/01/2011  . Leg weakness, bilateral 10/26/2010  . Stiffness of joints, not elsewhere classified, multiple sites 10/26/2010  . Back pain, chronic, prior back surg 10/20/2010    Tanaka Gillen C 11/27/2014, 12:25 PM  12:26 PM  Etta Grandchild, PT, DPT [place] License # [id number]       [facility] [address] [place], [place], [address] Phone: [phone]   Fax:  [phone]

## 2014-11-30 ENCOUNTER — Ambulatory Visit (HOSPITAL_COMMUNITY): Payer: Medicare Other | Attending: Neurosurgery

## 2014-11-30 DIAGNOSIS — R29898 Other symptoms and signs involving the musculoskeletal system: Secondary | ICD-10-CM | POA: Diagnosis present

## 2014-11-30 DIAGNOSIS — M545 Low back pain, unspecified: Secondary | ICD-10-CM

## 2014-11-30 DIAGNOSIS — Z7409 Other reduced mobility: Secondary | ICD-10-CM | POA: Diagnosis present

## 2014-11-30 DIAGNOSIS — Z9181 History of falling: Secondary | ICD-10-CM | POA: Insufficient documentation

## 2014-11-30 NOTE — Therapy (Signed)
[place], [place], [address] Phone: [phone]   Fax:  [phone]  Physical Therapy Treatment  Patient Details  Name: [name] MRN: [id number] Date of Birth: 03/31/1933 Referring Provider:  Erline Levine, MD  Encounter Date: 11/30/2014      PT End of Session - 11/30/14 0937    Visit Number 12   Number of Visits 15   Date for PT Re-Evaluation 12/18/14   Authorization Type Medicare   Authorization Time Period G-codes done April 30, 2022 visit    Authorization - Visit Number 12   Authorization - Number of Visits 19   PT Start Time 0846   PT Stop Time 0933   PT Time Calculation (min) 47 min   Activity Tolerance Patient tolerated treatment well;Patient limited by fatigue   Behavior During Therapy [facility] for tasks assessed/performed      Past Medical History  Diagnosis Date  . Diabetes mellitus type II   . Hypothyroidism   . Hypertension   . GERD (gastroesophageal reflux disease)   . Angina   . CHF (congestive heart failure)     diastolic  . Heart attack   . COPD (chronic obstructive pulmonary disease)   . Coronary artery disease     CABG '98, cath '08, low risk Nuc 3/14  . Obesity   . PVD (peripheral vascular disease)     Lt vertebral stent '98  . Diastolic dysfunction     grade 1 with an EF of 60-65% 4/13  . OSA on CPAP   . High cholesterol   . Chronic bronchitis     "usually get it q yr" (12/17/2012)  . Exertional shortness of breath   . Arthritis     "knees, hips" (12/17/2012)  . Gout     "ankles, legs" (12/17/2012)  . Pneumonia 05/2011  . CAD, CABG '98, cath '08, low risk Myoview Feb 2012, stable cath 12/19/12 with chronically occluded VG to LCX, patent LIMA-LAD & VG to dominant rt.  06/01/2011    Patent LIMA-LAD, oclluded SVG-CFX, patent SVG-RCA in 2008   . Bradycardia, to 30 12/20/2012  . Nephrolithiasis   . CKD (chronic kidney disease) stage 3, GFR 30-59 ml/min 12/20/2012    Past Surgical  History  Procedure Laterality Date  . Back surgery    . Replantation finger Right 2000    "little finger" (12/17/2012)  . Cholecystectomy    . Tonsillectomy  ?1960's  . Knee arthroscopy Left 1990's  . Lumbar laminectomy/decompression microdiscectomy  04/11/2011    Procedure: LUMBAR LAMINECTOMY/DECOMPRESSION MICRODISCECTOMY 1 LEVEL;  Surgeon: Peggyann Shoals, MD;  Location: [place] [facility];  Service: Neurosurgery;  Laterality: Right;  RIGHT Lumbar Three-Four laminectomy with resection of synovial cyst  . Angioplasty  '99, 00, March 06  . Tibia fracture surgery Right ~ 2012  . Coronary artery bypass graft  04/1996    "CABG X6" (12/17/2012)  . Cataract extraction w/ intraocular lens  implant, bilateral    . Lithotripsy      "once" (12/17/2012)  . Cardiac catheterization  April 2006; 12/19/2012  . Coronary angioplasty with stent placement      "I've had a total of 3-4 stents put in" (12/17/2012)  . Fracture surgery    . Colonoscopy  08/09/2004    [id number] diverticula.  Remainder of colonic mucosa appeared normal/normal rectum  . Vertiberal artery stent      1998, 99% blocked.  . Colonoscopy N/A 07/31/2013    YFV:[id number] colonic polyp-removed  as described above. Status post segmental biopsy to evaluate for microscopic colitis. bx tubular adenoma, asc colon folcal active colitis/lyphoid aggregates (nonspecific)  . Left heart catheterization with coronary angiogram N/A 12/19/2012    Procedure: LEFT HEART CATHETERIZATION WITH CORONARY ANGIOGRAM;  Surgeon: Lorretta Harp, MD;  Location: [name] CATH LAB;  Service: Cardiovascular;  Laterality: N/A;    There were no vitals filed for this visit.  Visit Diagnosis:  Impaired functional mobility and activity tolerance  Risk for falls  Bilateral low back pain without sciatica  Weakness of both legs      Subjective Assessment - 11/30/14 0903    Subjective Pt reports he feels 'like a frog ready to hop' today. He reports thhat after session on  Friday, his shoulder pain persisted, until he finally decided to take a nap, and after sleeping for about 4 hours, he awoke feeling much better. He says he felt great all weekend.    Patient Stated Goals improve strength, improve mobility   Currently in Pain? No/denies   Pain Score 0-No pain                         [facility] Adult PT Treatment/Exercise - 11/30/14 0001    Ambulation/Gait   Ambulation/Gait Yes   Ambulation/Gait Assistance 5: Supervision   Ambulation Distance (Feet) 590 Feet   Assistive device Straight cane   Ambulation Surface Level;Paved;Outdoor   Gait Comments Pt rests about every 258f; denies shoulder/chest pain; moderate SOB noted.    Lumbar Exercises: Stretches   Single Knee to Chest Stretch 3 reps;30 seconds  bilat, with towel behind knee to improve grasp   Lower Trunk Rotation Other (comment)  20x bilat bilat knee swings   Lumbar Exercises: Standing   Other Standing Lumbar Exercises --   Lumbar Exercises: Supine   Bridge 15 reps  2x15, wide stance   Straight Leg Raise 10 reps  2x10 bilat   Other Supine Lumbar Exercises groin stretch: reclined bound angle pose  knees at 90+ degrees feet together; 2x45 seconds    Other Supine Lumbar Exercises hooklying -> contralateral knee reaching  2x10 bilat,                 PT Education - 11/30/14 03007   Education provided No          PT Short Term Goals - 11/20/14 1003    PT SHORT TERM GOAL #1   Title Pt will be independent with HEP.    Time 2   Period Weeks   Status Partially Met   PT SHORT TERM GOAL #2   Title Pt will complete five time sit to stand with UE support in 35 seconds to demonstrate increased BLE strength.    Time 2   Period Weeks   Status Achieved   PT SHORT TERM GOAL #3   Title Pt will score 24 or greater on Berg to demonstrate decreased fall risk.    Baseline 18 --> 28   Time 2   Period Weeks   Status New           PT Long Term Goals - 11/20/14 1219    PT  LONG TERM GOAL #1   Title Pt will be indendent with advanced HEP.    Time 4   Period Weeks   Status On-going   PT LONG TERM GOAL #2   Title Pt will complete five time sit to stand without UE support in 30  seconds or less to demonstrate improved BLE strength.    Time 4   Period Weeks   Status On-going   PT LONG TERM GOAL #3   Title Pt will score 40 or greater on Berg to demonstrate decreased risk for falls.    Time 4   Period Weeks   Status On-going   PT LONG TERM GOAL #4   Title Pt will ascend/descend 8 stairs with reciprocal pattern and no LOB to demonstrate improved safety.    Time 4   Period Weeks   Status Achieved               Plan - 11/30/14 1700    Clinical Impression Statement Pt tolerated treatment session well, motivated to continue with advancing gait training. Pt is more understanding of pacing adn rest break today with walking. Pt demonstrate progress toward goals evident in increased amb distance, all performed absent increases in pain, unlike previous session. Pt conitinues to be quite limited with activity tolerance, requiring ample rest breaks between exercises. Regardless, all  strength training continues to be performed in a circuit training format to continually challenge patient's activity tolerance and endurance. Pt strength looks very good, with exception to core strength, whereas patient is very reliant on assistance for supine to/from sitting.    Pt will benefit from skilled therapeutic intervention in order to improve on the following deficits Abnormal gait;Decreased activity tolerance;Decreased balance;Decreased endurance;Decreased range of motion;Decreased strength;Difficulty walking;Pain   Rehab Potential Good   Clinical Impairments Affecting Rehab Potential Pt morbidly obese, hx of long standing back pain.    PT Frequency 2x / week   PT Duration 4 weeks   PT Treatment/Interventions ADLs/Self Care Home Management;Gait training;Stair  training;Functional mobility training;Therapeutic activities;Therapeutic exercise;Balance training;Neuromuscular re-education;Patient/family education;Manual techniques   PT Next Visit Plan Progress core strengthening, especailly trunk flexors; progress dynamic balance activities, continue to progress distance in walking intervals.    PT Home Exercise Plan no changes this session.    Consulted and Agree with Plan of Care Patient        Problem List Patient Active Problem List   Diagnosis Date Noted  . GERD (gastroesophageal reflux disease) 12/25/2013  . Anemia of chronic disease 12/25/2013  . Constipation 10/27/2013  . Other malaise and fatigue 10/27/2013  . Unspecified hypothyroidism 10/27/2013  . Bowel habit changes 07/16/2013  . Flatulence 07/16/2013  . Occipital pain 04/07/2013  . Hyperlipidemia 01/06/2013  . Angina effort, wtih positive stress test 12/20/2012  . CKD (chronic kidney disease) stage 3, GFR 30-59 ml/min 12/20/2012  . Bradycardia, to 30 12/20/2012  . Weak 12/17/2012  . Dizziness 12/17/2012  . Exertional angina-  12/09/2012  . Dyspnea 06/09/2011  . Sleep apnea, on C-pap 06/09/2011  . PVD, Lt Vertebral artery PTA in '08, known moderate ICA disease 06/09/2011  . Chronic diastolic heart failure ([place]) 06/01/2011  . UTI (lower urinary tract infection) 06/01/2011  . DM (diabetes mellitus) ([place]) 06/01/2011  . Hypothyroidism 06/01/2011  . HTN (hypertension) 06/01/2011  . COPD (chronic obstructive pulmonary disease) ([place]) 06/01/2011  . CAD, CABG '98, cath '08, low risk Myoview Feb 2012, stable cath 12/19/12 with chronically occluded VG to LCX, patent LIMA-LAD & VG to dominant rt.  06/01/2011  . Obesity 06/01/2011  . Depression 06/01/2011  . Leg weakness, bilateral 10/26/2010  . Stiffness of joints, not elsewhere classified, multiple sites 10/26/2010  . Back pain, chronic, prior back surg 10/20/2010    Buccola,Allan C 11/30/2014, 9:44 AM  9:44 AM  [place]  Kalman Drape,  PT, DPT [place] License # [id number]       [facility] [facility] [address] [place], [place], [address] Phone: [phone]   Fax:  [phone]

## 2014-12-01 ENCOUNTER — Encounter (HOSPITAL_COMMUNITY): Payer: Medicare Other | Admitting: Physical Therapy

## 2014-12-01 ENCOUNTER — Encounter: Payer: Self-pay | Admitting: Podiatry

## 2014-12-01 ENCOUNTER — Ambulatory Visit (INDEPENDENT_AMBULATORY_CARE_PROVIDER_SITE_OTHER): Payer: Medicare Other | Admitting: Podiatry

## 2014-12-01 DIAGNOSIS — B351 Tinea unguium: Secondary | ICD-10-CM

## 2014-12-01 DIAGNOSIS — E1140 Type 2 diabetes mellitus with diabetic neuropathy, unspecified: Secondary | ICD-10-CM

## 2014-12-01 DIAGNOSIS — M79676 Pain in unspecified toe(s): Secondary | ICD-10-CM

## 2014-12-01 NOTE — Progress Notes (Signed)
Presents today with a chief complaint of painful elongated toenails 1 through 5 bilateral.  Objective: Vital signs are stable alert and oriented 3 states his blood sugars under good control. Pulses are strongly palpable. Nails are thick yellow dystrophic clinical mycotic. No open lesions or wounds.  Assessment: Non-insulin-dependent diabetes mellitus pain and limp secondary to onychomycosis 1 through 5 bilateral.  Plan: Debridement of nails 1 through 5 bilateral covered service secondary to pain.

## 2014-12-03 ENCOUNTER — Ambulatory Visit (HOSPITAL_COMMUNITY): Payer: Medicare Other

## 2014-12-03 DIAGNOSIS — Z7409 Other reduced mobility: Secondary | ICD-10-CM | POA: Diagnosis not present

## 2014-12-03 DIAGNOSIS — M545 Low back pain, unspecified: Secondary | ICD-10-CM

## 2014-12-03 DIAGNOSIS — Z9181 History of falling: Secondary | ICD-10-CM

## 2014-12-03 DIAGNOSIS — R29898 Other symptoms and signs involving the musculoskeletal system: Secondary | ICD-10-CM

## 2014-12-03 NOTE — Therapy (Signed)
[place], [place], [address] Phone: [phone]   Fax:  [phone]  Physical Therapy Treatment  Patient Details  Name: [name] MRN: [id number] Date of Birth: 1934-01-12 Referring Provider:  Erline Levine, MD  Encounter Date: 12/03/2014      PT End of Session - 12/03/14 1022    Visit Number 13   Number of Visits 15   Date for PT Re-Evaluation 12/18/14   Authorization Type Medicare   Authorization Time Period G-codes done 2022-04-16 visit    Authorization - Visit Number 13   Authorization - Number of Visits 19   PT Start Time 1020   PT Stop Time 1106   PT Time Calculation (min) 46 min   Activity Tolerance Patient tolerated treatment well;Patient limited by fatigue   Behavior During Therapy [facility] for tasks assessed/performed      Past Medical History  Diagnosis Date  . Diabetes mellitus type II   . Hypothyroidism   . Hypertension   . GERD (gastroesophageal reflux disease)   . Angina   . CHF (congestive heart failure) (HCC)     diastolic  . Heart attack ([place])   . COPD (chronic obstructive pulmonary disease) ([place])   . Coronary artery disease     CABG '98, cath '08, low risk Nuc 3/14  . Obesity   . PVD (peripheral vascular disease) ([place])     Lt vertebral stent '98  . Diastolic dysfunction     grade 1 with an EF of 60-65% 4/13  . OSA on CPAP   . High cholesterol   . Chronic bronchitis ([place])     "usually get it q yr" (12/17/2012)  . Exertional shortness of breath   . Arthritis     "knees, hips" (12/17/2012)  . Gout     "ankles, legs" (12/17/2012)  . Pneumonia 05/2011  . CAD, CABG '98, cath '08, low risk Myoview Feb 2012, stable cath 12/19/12 with chronically occluded VG to LCX, patent LIMA-LAD & VG to dominant rt.  06/01/2011    Patent LIMA-LAD, oclluded SVG-CFX, patent SVG-RCA in 2008   . Bradycardia, to 30 12/20/2012  . Nephrolithiasis   . CKD (chronic kidney disease) stage 3, GFR 30-59 ml/min  12/20/2012    Past Surgical History  Procedure Laterality Date  . Back surgery    . Replantation finger Right 2000    "little finger" (12/17/2012)  . Cholecystectomy    . Tonsillectomy  ?1960's  . Knee arthroscopy Left 1990's  . Lumbar laminectomy/decompression microdiscectomy  04/11/2011    Procedure: LUMBAR LAMINECTOMY/DECOMPRESSION MICRODISCECTOMY 1 LEVEL;  Surgeon: Peggyann Shoals, MD;  Location: [facility];  Service: Neurosurgery;  Laterality: Right;  RIGHT Lumbar Three-Four laminectomy with resection of synovial cyst  . Angioplasty  '99, 00, March 06  . Tibia fracture surgery Right ~ 2012  . Coronary artery bypass graft  04/1996    "CABG X6" (12/17/2012)  . Cataract extraction w/ intraocular lens  implant, bilateral    . Lithotripsy      "once" (12/17/2012)  . Cardiac catheterization  April 2006; 12/19/2012  . Coronary angioplasty with stent placement      "I've had a total of 3-4 stents put in" (12/17/2012)  . Fracture surgery    . Colonoscopy  08/09/2004    [id number] diverticula.  Remainder of colonic mucosa appeared normal/normal rectum  . Vertiberal artery stent      1998, 99% blocked.  . Colonoscopy N/A 07/31/2013  [id number] colonic polyp-removed as described above. Status post segmental biopsy to evaluate for microscopic colitis. bx tubular adenoma, asc colon folcal active colitis/lyphoid aggregates (nonspecific)  . Left heart catheterization with coronary angiogram N/A 12/19/2012    Procedure: LEFT HEART CATHETERIZATION WITH CORONARY ANGIOGRAM;  Surgeon: Lorretta Harp, MD;  Location: [facility] CATH LAB;  Service: Cardiovascular;  Laterality: N/A;    There were no vitals filed for this visit.  Visit Diagnosis:  Risk for falls  Bilateral low back pain without sciatica  Weakness of both legs  Impaired functional mobility and activity tolerance      Subjective Assessment - 12/03/14 0958    Subjective Pt stated minimal pain today in Rt groin area   Currently in  Pain? Yes   Pain Score 1    Pain Location Groin   Pain Orientation Right               [facility] Adult PT Treatment/Exercise - 12/03/14 0001    Ambulation/Gait   Ambulation/Gait Yes   Ambulation/Gait Assistance 5: Supervision   Ambulation Distance (Feet) 590 Feet   Assistive device Straight cane   Ambulation Surface Level;Outdoor;Paved   Gait Comments 3 standing rest breaks    Lumbar Exercises: Stretches   Single Knee to Chest Stretch 3 reps;30 seconds  with towel behind knee   Lower Trunk Rotation Limitations 10x 10"   Quad Stretch 3 reps;30 seconds   Quad Stretch Limitations supine hip flexor stretch supine with LE off edge of mat   Lumbar Exercises: Standing   Heel Raises 20 reps   Functional Squats 15 reps;Other (comment)   Other Standing Lumbar Exercises marching 15x 5" on airex   Other Standing Lumbar Exercises hip abduction 2x15; hip extension 2x10 standing on airex BHHA   Lumbar Exercises: Supine   Bent Knee Raise 15 reps;5 seconds   Bent Knee Raise Limitations with ab sets   Bridge 15 reps   Other Supine Lumbar Exercises groin stretch: reclined bound angle pose   Other Supine Lumbar Exercises hooklying -> contralateral knee reaching                  PT Short Term Goals - 11/20/14 1003    PT SHORT TERM GOAL #1   Title Pt will be independent with HEP.    Time 2   Period Weeks   Status Partially Met   PT SHORT TERM GOAL #2   Title Pt will complete five time sit to stand with UE support in 35 seconds to demonstrate increased BLE strength.    Time 2   Period Weeks   Status Achieved   PT SHORT TERM GOAL #3   Title Pt will score 24 or greater on Berg to demonstrate decreased fall risk.    Baseline 18 --> 28   Time 2   Period Weeks   Status New           PT Long Term Goals - 11/20/14 1219    PT LONG TERM GOAL #1   Title Pt will be indendent with advanced HEP.    Time 4   Period Weeks   Status On-going   PT LONG TERM GOAL #2   Title Pt will  complete five time sit to stand without UE support in 30 seconds or less to demonstrate improved BLE strength.    Time 4   Period Weeks   Status On-going   PT LONG TERM GOAL #3   Title Pt will score 40 or  greater on Berg to demonstrate decreased risk for falls.    Time 4   Period Weeks   Status On-going   PT LONG TERM GOAL #4   Title Pt will ascend/descend 8 stairs with reciprocal pattern and no LOB to demonstrate improved safety.    Time 4   Period Weeks   Status Achieved               Plan - 12/03/14 1142    Clinical Impression Statement Session focus on improving core strengthening and improving activity tolerance with gait training.  Added marching activities on airex for core strengthening and to improve balance with cueing for posture. Began groin stretch on edge of mat wtih reports of relief.  Pt improving gait mechanics though does continue to require standing rest breaks due to SOB and reduced activity tolerance.  No reports of pain through session.   PT Next Visit Plan Progress core strengthening, especailly trunk flexors; progress dynamic balance activities, continue to progress distance in walking intervals.         Problem List Patient Active Problem List   Diagnosis Date Noted  . GERD (gastroesophageal reflux disease) 12/25/2013  . Anemia of chronic disease 12/25/2013  . Constipation 10/27/2013  . Other malaise and fatigue 10/27/2013  . Unspecified hypothyroidism 10/27/2013  . Bowel habit changes 07/16/2013  . Flatulence 07/16/2013  . Occipital pain 04/07/2013  . Hyperlipidemia 01/06/2013  . Angina effort, wtih positive stress test 12/20/2012  . CKD (chronic kidney disease) stage 3, GFR 30-59 ml/min 12/20/2012  . Bradycardia, to 30 12/20/2012  . Weak 12/17/2012  . Dizziness 12/17/2012  . Exertional angina-  12/09/2012  . Dyspnea 06/09/2011  . Sleep apnea, on C-pap 06/09/2011  . PVD, Lt Vertebral artery PTA in '08, known moderate ICA disease 06/09/2011   . Chronic diastolic heart failure ([place]) 06/01/2011  . UTI (lower urinary tract infection) 06/01/2011  . DM (diabetes mellitus) ([place]) 06/01/2011  . Hypothyroidism 06/01/2011  . HTN (hypertension) 06/01/2011  . COPD (chronic obstructive pulmonary disease) ([place]) 06/01/2011  . CAD, CABG '98, cath '08, low risk Myoview Feb 2012, stable cath 12/19/12 with chronically occluded VG to LCX, patent LIMA-LAD & VG to dominant rt.  06/01/2011  . Obesity 06/01/2011  . Depression 06/01/2011  . Leg weakness, bilateral 10/26/2010  . Stiffness of joints, not elsewhere classified, multiple sites 10/26/2010  . Back pain, chronic, prior back surg 10/20/2010   Ihor Austin, LPTA; [place]   Aldona Lento 12/03/2014, 12:12 PM  [facility] [address] [place], [place], [address] Phone: [phone]   Fax:  [phone]

## 2014-12-08 ENCOUNTER — Ambulatory Visit (HOSPITAL_COMMUNITY): Payer: Medicare Other

## 2014-12-10 ENCOUNTER — Ambulatory Visit (HOSPITAL_COMMUNITY): Payer: Medicare Other | Admitting: Physical Therapy

## 2014-12-10 DIAGNOSIS — M545 Low back pain, unspecified: Secondary | ICD-10-CM

## 2014-12-10 DIAGNOSIS — Z7409 Other reduced mobility: Secondary | ICD-10-CM | POA: Diagnosis not present

## 2014-12-10 DIAGNOSIS — R29898 Other symptoms and signs involving the musculoskeletal system: Secondary | ICD-10-CM

## 2014-12-10 DIAGNOSIS — Z9181 History of falling: Secondary | ICD-10-CM

## 2014-12-10 NOTE — Patient Instructions (Signed)
Functional Quadriceps: Sit to Stand    Sit on edge of chair, feet flat on floor. Stand upright, extending knees fully. Repeat __5-10__ times per set. Do _1___ sets per session. Do _2___ sessions per day.  http://orth.exer.us/734   Copyright  VHI. All rights reserved.   TUMMY SQUEEZES  Squeeze your stomach muscles so it feels like you are squeezing your belly button to your backbone. Hold for 2 seconds and release. Repeat 20 times, 3-5 times per day.      TANDEM STANCE WITH SUPPORT  Stand in front of a chair, table or counter top for support. Then place the heel of one foot so that it is touching the toes of the other foot. Maintain your balance in this position.  Hold for 15 seconds, then switch your feet and repeat. Do 2-3 times each side, 2x/day.

## 2014-12-10 NOTE — Therapy (Signed)
[place], [place], [address] Phone: [phone]   Fax:  [phone]  Physical Therapy Treatment (Discharge)  Patient Details  Name: DWON SKY MRN: [id number] Date of Birth: August 28, 1933 Referring Provider:  Erline Levine, MD  Encounter Date: 12/10/2014      PT End of Session - 12/10/14 1108    Visit Number 14   Number of Visits 14   Authorization Type Medicare   Authorization - Visit Number 14   Authorization - Number of Visits 19   PT Start Time 1016   PT Stop Time 1102   PT Time Calculation (min) 46 min   Activity Tolerance Patient tolerated treatment well;Patient limited by fatigue   Behavior During Therapy [facility] for tasks assessed/performed      Past Medical History  Diagnosis Date  . Diabetes mellitus type II   . Hypothyroidism   . Hypertension   . GERD (gastroesophageal reflux disease)   . Angina   . CHF (congestive heart failure) (HCC)     diastolic  . Heart attack ([place])   . COPD (chronic obstructive pulmonary disease) ([place])   . Coronary artery disease     CABG '98, cath '08, low risk Nuc 3/14  . Obesity   . PVD (peripheral vascular disease) ([place])     Lt vertebral stent '98  . Diastolic dysfunction     grade 1 with an EF of 60-65% 4/13  . OSA on CPAP   . High cholesterol   . Chronic bronchitis ([place])     "usually get it q yr" (12/17/2012)  . Exertional shortness of breath   . Arthritis     "knees, hips" (12/17/2012)  . Gout     "ankles, legs" (12/17/2012)  . Pneumonia 05/2011  . CAD, CABG '98, cath '08, low risk Myoview Feb 2012, stable cath 12/19/12 with chronically occluded VG to LCX, patent LIMA-LAD & VG to dominant rt.  06/01/2011    Patent LIMA-LAD, oclluded SVG-CFX, patent SVG-RCA in 2008   . Bradycardia, to 30 12/20/2012  . Nephrolithiasis   . CKD (chronic kidney disease) stage 3, GFR 30-59 ml/min 12/20/2012    Past Surgical History  Procedure Laterality Date  . Back  surgery    . Replantation finger Right 2000    "little finger" (12/17/2012)  . Cholecystectomy    . Tonsillectomy  ?1960's  . Knee arthroscopy Left 1990's  . Lumbar laminectomy/decompression microdiscectomy  04/11/2011    Procedure: LUMBAR LAMINECTOMY/DECOMPRESSION MICRODISCECTOMY 1 LEVEL;  Surgeon: Peggyann Shoals, MD;  Location: [facility];  Service: Neurosurgery;  Laterality: Right;  RIGHT Lumbar Three-Four laminectomy with resection of synovial cyst  . Angioplasty  '99, 00, March 06  . Tibia fracture surgery Right ~ 2012  . Coronary artery bypass graft  04/1996    "CABG X6" (12/17/2012)  . Cataract extraction w/ intraocular lens  implant, bilateral    . Lithotripsy      "once" (12/17/2012)  . Cardiac catheterization  April 2006; 12/19/2012  . Coronary angioplasty with stent placement      "I've had a total of 3-4 stents put in" (12/17/2012)  . Fracture surgery    . Colonoscopy  08/09/2004    [id number] diverticula.  Remainder of colonic mucosa appeared normal/normal rectum  . Vertiberal artery stent      1998, 99% blocked.  . Colonoscopy N/A 07/31/2013    IPJ:[id number] colonic polyp-removed as described above. Status post segmental biopsy to evaluate for microscopic  colitis. bx tubular adenoma, asc colon folcal active colitis/lyphoid aggregates (nonspecific)  . Left heart catheterization with coronary angiogram N/A 12/19/2012    Procedure: LEFT HEART CATHETERIZATION WITH CORONARY ANGIOGRAM;  Surgeon: Lorretta Harp, MD;  Location: [facility] CATH LAB;  Service: Cardiovascular;  Laterality: N/A;    There were no vitals filed for this visit.  Visit Diagnosis:  Risk for falls  Bilateral low back pain without sciatica  Weakness of both legs  Impaired functional mobility and activity tolerance      Subjective Assessment - 12/10/14 1021    Subjective Patient reports he is just doing OK today   How long can you sit comfortably? 10/13- no limits    How long can you stand comfortably?  10/13- patient reports he thinks he could stand for a long time but cannot recall exactly    How long can you walk comfortably? 10/13- about 64f before needing some rest    Currently in Pain? No/denies            [facility] - 12/10/14 0001    Observation/Other Assessments   Focus on Therapeutic Outcomes (FOTO)  46% limited    AROM   Lumbar Flexion 48   Lumbar Extension 10   Lumbar - Right Side Bend 24   Lumbar - Left Side Bend 28   PROM   Right Hip External Rotation  --  WNL    Strength   Right Hip Flexion 3+/5   Right Hip Extension 4-/5   Right Hip ABduction 4/5   Left Hip Flexion 3+/5   Left Hip Extension 4-/5   Left Hip ABduction 4/5   Right Knee Flexion 4+/5   Right Knee Extension 5/5   Left Knee Flexion 4+/5   Left Knee Extension 5/5   Right Ankle Dorsiflexion 4/5   Left Ankle Dorsiflexion 4/5                     [facility] Adult PT Treatment/Exercise - 12/10/14 0001    Lumbar Exercises: Stretches   Active Hamstring Stretch 2 reps;30 seconds   Active Hamstring Stretch Limitations 12 inch box    Passive Hamstring Stretch 3 reps;30 seconds   Passive Hamstring Stretch Limitations gastroc on slantboard                 PT Education - 12/10/14 1107    Education provided Yes   Education Details progress with skilled PT services, DC today, updated HEP    Person(s) Educated Patient   Methods Explanation;Demonstration;Handout   Comprehension Verbalized understanding          PT Short Term Goals - 12/10/14 1048    PT SHORT TERM GOAL #1   Title Pt will be independent with HEP.    Baseline 10/13- doing most days but not every day    Time 2   Period Weeks   Status Partially Met   PT SHORT TERM GOAL #2   Title Pt will complete five time sit to stand with UE support in 35 seconds to demonstrate increased BLE strength.    Time 2   Period Weeks   Status Achieved   PT SHORT TERM GOAL #3   Title Pt will score 24 or greater on Berg to  demonstrate decreased fall risk.    Baseline 18 --> 28   Time 2   Period Weeks   Status Achieved           PT Long Term Goals -  2014-12-14 1049    PT LONG TERM GOAL #1   Title Pt will be indendent with advanced HEP.    Time 4   Period Weeks   Status On-going   PT LONG TERM GOAL #2   Title Pt will complete five time sit to stand without UE support in 30 seconds or less to demonstrate improved BLE strength.    Baseline 12/14/22- unable to do without UEs    Time 4   Period Weeks   Status On-going   PT LONG TERM GOAL #3   Title Pt will score 40 or greater on Berg to demonstrate decreased risk for falls.    Time 4   Period Weeks   Status Not Met   PT LONG TERM GOAL #4   Title Pt will ascend/descend 8 stairs with reciprocal pattern and no LOB to demonstrate improved safety.    Time 4   Period Weeks   Status Achieved               Plan - 14-Dec-2014 1108    Clinical Impression Statement Discharge assessment performed today. Patient does show some improvement in general pain, strength, balance, and lumbar/hip ROM however does not show improvements in functional task performance, functional activity tolerance, or functional mechanics. At this point patient is appropriate for DC to advanced HEP, and is no longer appropriate for continuation with skilled PT services.    Pt will benefit from skilled therapeutic intervention in order to improve on the following deficits Abnormal gait;Decreased activity tolerance;Decreased balance;Decreased endurance;Decreased range of motion;Decreased strength;Difficulty walking;Pain   Rehab Potential Good   Clinical Impairments Affecting Rehab Potential Pt morbidly obese, hx of long standing back pain.    PT Frequency 2x / week   PT Duration 4 weeks   PT Treatment/Interventions ADLs/Self Care Home Management;Gait training;Stair training;Functional mobility training;Therapeutic activities;Therapeutic exercise;Balance training;Neuromuscular  re-education;Patient/family education;Manual techniques   PT Next Visit Plan DC today    PT Home Exercise Plan advanced HEP given    Consulted and Agree with Plan of Care Patient          G-Codes - 2014-12-14 1112    Functional Assessment Tool Used FOTO 46% limited    Functional Limitation Mobility: Walking and moving around   Mobility: Walking and Moving Around Goal Status [phone]) At least 40 percent but less than 60 percent impaired, limited or restricted   Mobility: Walking and Moving Around Discharge Status [phone]) At least 40 percent but less than 60 percent impaired, limited or restricted      Problem List Patient Active Problem List   Diagnosis Date Noted  . GERD (gastroesophageal reflux disease) 12/25/2013  . Anemia of chronic disease 12/25/2013  . Constipation 10/27/2013  . Other malaise and fatigue 10/27/2013  . Unspecified hypothyroidism 10/27/2013  . Bowel habit changes 07/16/2013  . Flatulence 07/16/2013  . Occipital pain 04/07/2013  . Hyperlipidemia 01/06/2013  . Angina effort, wtih positive stress test 12/20/2012  . CKD (chronic kidney disease) stage 3, GFR 30-59 ml/min 12/20/2012  . Bradycardia, to 30 12/20/2012  . Weak 12/17/2012  . Dizziness 12/17/2012  . Exertional angina-  12-13-2012  . Dyspnea 06/09/2011  . Sleep apnea, on C-pap 06/09/2011  . PVD, Lt Vertebral artery PTA in '08, known moderate ICA disease 06/09/2011  . Chronic diastolic heart failure ([place]) 06/01/2011  . UTI (lower urinary tract infection) 06/01/2011  . DM (diabetes mellitus) ([place]) 06/01/2011  . Hypothyroidism 06/01/2011  . HTN (hypertension) 06/01/2011  . COPD (chronic obstructive  pulmonary disease) ([place]) 06/01/2011  . CAD, CABG '98, cath '08, low risk Myoview Feb 2012, stable cath 12/19/12 with chronically occluded VG to LCX, patent LIMA-LAD & VG to dominant rt.  06/01/2011  . Obesity 06/01/2011  . Depression 06/01/2011  . Leg weakness, bilateral 10/26/2010  . Stiffness of joints,  not elsewhere classified, multiple sites 10/26/2010  . Back pain, chronic, prior back surg 10/20/2010    PHYSICAL THERAPY DISCHARGE SUMMARY  Visits from Start of Care: 14  Current functional level related to goals / functional outcomes: While patient has show some strength, balance, and ROM improvements he is generally at his baseline level of function.    Remaining deficits: Deficits in balance, ROM of lumbar spine and hips, LE power, functional task performance and activity tolerance    Education / Equipment: DC today, advanced HEP  Plan: Patient agrees to discharge.  Patient goals were partially met. Patient is being discharged due to lack of progress.  ?????       Deniece Ree PT, DPT [facility] [address] [place], [place], [address] Phone: [phone]   Fax:  [phone]

## 2015-02-03 ENCOUNTER — Encounter: Payer: Self-pay | Admitting: Cardiology

## 2015-02-03 ENCOUNTER — Ambulatory Visit (INDEPENDENT_AMBULATORY_CARE_PROVIDER_SITE_OTHER): Payer: Medicare Other | Admitting: Cardiology

## 2015-02-03 VITALS — BP 138/76 | HR 68 | Ht 71.0 in | Wt 277.3 lb

## 2015-02-03 DIAGNOSIS — I10 Essential (primary) hypertension: Secondary | ICD-10-CM

## 2015-02-03 DIAGNOSIS — E669 Obesity, unspecified: Secondary | ICD-10-CM | POA: Diagnosis not present

## 2015-02-03 DIAGNOSIS — G4730 Sleep apnea, unspecified: Secondary | ICD-10-CM | POA: Diagnosis not present

## 2015-02-03 DIAGNOSIS — I6529 Occlusion and stenosis of unspecified carotid artery: Secondary | ICD-10-CM

## 2015-02-03 DIAGNOSIS — J438 Other emphysema: Secondary | ICD-10-CM

## 2015-02-03 DIAGNOSIS — I25798 Atherosclerosis of other coronary artery bypass graft(s) with other forms of angina pectoris: Secondary | ICD-10-CM

## 2015-02-03 NOTE — Progress Notes (Signed)
02/03/2015 [name]   Sep 23, 1933  [id number]  Primary Physician HAWKINS,EDWARD L, MD Primary Cardiologist: Dr Gwenlyn Found  HPI:  81 year old moderately overweight married Caucasian male father of 1 child (1 deceased), grandfather to 14 stepchild who is accompanied by his wife and daughter today. He has a history of CAD and PVOD. He had coronary artery bypass grafting in March 1998 with a LIMA to his LAD, a vein to the circumflex obtuse marginal branch, a vein to the right coronary artery. He also had left vertebral artery stenting by Dr. Leitha Schuller in [place] after his surgery in May 1998. His other problems include hypertension, hyperlipidemia, non-insulin-dependent diabetes and COPD. He has moderate renal insufficiency and was taken off ACE. He has obstructive sleep apnea on CPAP and is compliant. Cath performed Oct 23/2015 showed his anatomy to be unchanged from his prior cath performed in 2008. His LIMA to his LAD is widely patent. His vein graft to the dominant right was widely patent with distal disease in the PLA branches, and vein graft to the circumflex was occluded at the aorta. The circumflex system filled faintly by right-to-left collaterals.  Continued medical therapy was recommended.  Dr. Luan Pulling follows his lipid profile.          He is in the office today for a 6 month check, complaining of exertional bilateral shoulder pain and SOB. He says he gets SOB and has pain across his shoulders with exertion. This has been chronic for him and not really changed, though his exercise tolerance has been gradually decreasing. He has COPD, is obese, and has LE neuropathy. His wife thinks that physical therapy (orderd by Dr Vertell Limber) has helped. He was unable to tolerate Ranexa in the past.    Current Outpatient Prescriptions  Medication Sig Dispense Refill  . acarbose (PRECOSE) 100 MG tablet Take 100 mg by mouth 3 (three) times daily with meals.    Marland Kitchen allopurinol (ZYLOPRIM) 300 MG tablet Take  150 mg by mouth at bedtime.     Marland Kitchen amLODipine (NORVASC) 10 MG tablet TAKE 1 TABLET BY MOUTH ONCE A DAY. 90 tablet 2  . aspirin EC 81 MG tablet Take 81 mg by mouth every morning.     Marland Kitchen atorvastatin (LIPITOR) 40 MG tablet Take 1 tablet (40 mg total) by mouth daily. 90 tablet 3  . Cholecalciferol (VITAMIN D3) 1000 UNITS CAPS Take 1 capsule by mouth 2 (two) times daily.     . clopidogrel (PLAVIX) 75 MG tablet TAKE ONE TABLET BY MOUTH ONCE DAILY. 90 tablet 0  . furosemide (LASIX) 40 MG tablet Take 1 tablet by mouth 2 (two) times daily.    Marland Kitchen gabapentin (NEURONTIN) 300 MG capsule Take 300 mg by mouth 4 (four) times daily as needed (takes three times daily but may take one in between if needed).    Marland Kitchen glipiZIDE (GLUCOTROL) 10 MG tablet Take 10 mg by mouth 2 (two) times daily.     Marland Kitchen HYDROcodone-acetaminophen (NORCO) 10-325 MG per tablet Take 1 tablet by mouth 4 (four) times daily as needed for pain.    Marland Kitchen insulin glargine (LANTUS) 100 UNIT/ML injection Inject 60 Units into the skin 2 (two) times daily.     . isosorbide dinitrate (ISORDIL) 20 MG tablet Take 1 tablet (20 mg total) by mouth 3 (three) times daily. 90 tablet 10  . levothyroxine (SYNTHROID, LEVOTHROID) 150 MCG tablet Take 150 mcg by mouth daily before breakfast.    . mometasone (ELOCON) 0.1 % ointment  Apply 1 application topically as directed.     . nitroGLYCERIN (NITROSTAT) 0.4 MG SL tablet Place 1 tablet (0.4 mg total) under the tongue every 5 (five) minutes as needed for chest pain. 25 tablet 2  . omeprazole (PRILOSEC) 20 MG capsule Take 20 mg by mouth at bedtime.     . [name] (JANUVIA) 100 MG tablet Take 50 mg by mouth 2 (two) times daily.     . Tiotropium Bromide Monohydrate (SPIRIVA HANDIHALER IN) Inhale 1 capsule into the lungs every morning.     . triamcinolone cream (KENALOG) 0.1 % Apply 1 application topically daily as needed (for irritation).      No current facility-administered medications for this visit.    Allergies    Allergen Reactions  . Demerol Nausea And Vomiting  . Ranexa [Ranolazine] Nausea Only    dizziness    Social History   Social History  . Marital Status: Married    Spouse Name: N/A  . Number of Children: N/A  . Years of Education: N/A   Occupational History  . retired    Social History Main Topics  . Smoking status: Former Smoker -- 3.50 packs/day for 40 years    Types: Cigarettes    Quit date: 12/09/1985  . Smokeless tobacco: Former Systems developer    Types: Chew     Comment: 12/17/2012 "quit chewing tobacco in 1998"  . Alcohol Use: Yes     Comment: 12/17/2012 "1-2 beers/wk" - socially   . Drug Use: No  . Sexual Activity: No   Other Topics Concern  . Not on file   Social History Narrative     Review of Systems: General: negative for chills, fever, night sweats or weight changes.  Cardiovascular: negative for chest pain, dyspnea on exertion, edema, orthopnea, palpitations, paroxysmal nocturnal dyspnea or shortness of breath Dermatological: negative for rash Respiratory: negative for cough or wheezing Urologic: negative for hematuria Abdominal: negative for nausea, vomiting, diarrhea, bright red blood per rectum, melena, or hematemesis Neurologic: negative for visual changes, syncope, or dizziness All other systems reviewed and are otherwise negative except as noted above.    Blood pressure 138/76, pulse 68, height 5\' 11"  (1.803 m), weight 277 lb 4.8 oz (125.782 kg).  General appearance: alert, cooperative, no distress and morbidly obese Lungs: clear to auscultation bilaterally Heart: regular rate and rhythm Neurologic: Grossly normal  EKG NSR, 1st degree AVB  ASSESSMENT AND PLAN:  Sleep apnea, on C-pap History of obstructive sleep apnea on sleep Pap which he benefits from   PVD, Lt Vertebral artery PTA in '08, known moderate ICA disease History of left vertebral artery stenting by Dr. Leitha Schuller in [place] in 1998 prior to coronary artery bypass grafting.  Carotid Dopplers performed 04/18/14 revealed moderate right ICA stenosis with patent vertebral artery. He is asymptomatic.   Hyperlipidemia History of hyperlipidemia on atorvastatin 40 mg a day followed by his PCP   HTN (hypertension) History of hypertension . He is on amlodipine. Continue current meds at current dosing   CAD, CABG '98, cath '08, low risk Myoview Feb 2012, stable cath 12/19/12 with chronically occluded VG to LCX, patent LIMA-LAD & VG to dominant rt.  History of coronary artery disease status post coronary artery bypass grafting in 1998 with a LIMA to his LAD, vein to the circumflex obtuse marginal branch and to the right coronary artery. He was catheterized 12/19/12 revealing anatomy unchanged from his cath in 2008. It was notable for an occluded vein to the obtuse marginal  branch. EF was 55%. He is on oral nitrates and amlodipine. He has chronic exertional angina.   PLAN  Mr Higgens is stable. I suggested he could take an extra Isordil if needed. F/U Dr Gwenlyn Found in 6 monhs.  Kerin Ransom K PA-C 02/03/2015 3:29 PM

## 2015-02-03 NOTE — Patient Instructions (Addendum)
Medication Instructions:  Your physician recommends that you continue on your current medications as directed. Please refer to the Current Medication list given to you today.  Labwork: none  Testing/Procedures: none  Follow-Up: Your physician wants you to follow-up in: 6 month ov with Dr Andria Rhein will receive a reminder letter in the mail two months in advance. If you don't receive a letter, please call our office to schedule the follow-up appointment.  If you need a refill on your cardiac medications before your next appointment, please call your pharmacy.

## 2015-02-04 ENCOUNTER — Other Ambulatory Visit: Payer: Self-pay | Admitting: Cardiovascular Disease

## 2015-03-04 ENCOUNTER — Ambulatory Visit (INDEPENDENT_AMBULATORY_CARE_PROVIDER_SITE_OTHER): Payer: Medicare Other

## 2015-03-04 ENCOUNTER — Encounter: Payer: Self-pay | Admitting: Podiatry

## 2015-03-04 ENCOUNTER — Ambulatory Visit (INDEPENDENT_AMBULATORY_CARE_PROVIDER_SITE_OTHER): Payer: Medicare Other | Admitting: Podiatry

## 2015-03-04 VITALS — BP 143/69 | HR 82 | Resp 12

## 2015-03-04 DIAGNOSIS — M79676 Pain in unspecified toe(s): Secondary | ICD-10-CM | POA: Diagnosis not present

## 2015-03-04 DIAGNOSIS — E1140 Type 2 diabetes mellitus with diabetic neuropathy, unspecified: Secondary | ICD-10-CM | POA: Diagnosis not present

## 2015-03-04 DIAGNOSIS — B351 Tinea unguium: Secondary | ICD-10-CM | POA: Diagnosis not present

## 2015-03-04 NOTE — Progress Notes (Signed)
   Subjective:    Patient ID: [name], male    DOB: [birth year], 82 y.o.   MRN: [id number]  HPI he presents today complaining of painful elongated toenails and pain to his right ankle which has been present for many years. He states that the ankle pain seems to be getting worse as of late. He denies any trauma.    Review of Systems  Cardiovascular: Positive for leg swelling.  Musculoskeletal: Positive for joint swelling.       Objective:   Physical Exam: Pulses are strongly palpable bilateral. Neurologic sensorium is intact. His toenails are thick yellow dystrophic with mycotic and painful palpation. Orthopedic evaluation doesn't straight pain on palpation and range of motion of the ankle right. The ankle appears to have full range of motion without complication though he does have pain on palpation anteriorly. Radiographs taken of the ankle today demonstrate no fracture to the fibula which is going to heal uneventfully but a normal ankle mortise with some joint space narrowing and spurring anteriorly. This is possibly associated with early*arthritic changes.        Assessment & Plan:  Assessment: Pain limb secondary to onychomycosis and capsulitis with also osteoarthritis right ankle.  Plan: After sterile Betadine skin prep injected the right ankle today. Debrided nails 1 through 5 bilateral. Follow up with him in 3 months.

## 2015-03-23 ENCOUNTER — Other Ambulatory Visit (HOSPITAL_COMMUNITY): Payer: Self-pay | Admitting: Pulmonary Disease

## 2015-03-23 ENCOUNTER — Ambulatory Visit (HOSPITAL_COMMUNITY)
Admission: RE | Admit: 2015-03-23 | Discharge: 2015-03-23 | Disposition: A | Discharge status: Home / Self Care | Payer: Medicare Other | Source: Ambulatory Visit | Attending: Pulmonary Disease | Admitting: Pulmonary Disease

## 2015-03-23 DIAGNOSIS — M25551 Pain in right hip: Secondary | ICD-10-CM | POA: Diagnosis present

## 2015-03-23 DIAGNOSIS — M25552 Pain in left hip: Secondary | ICD-10-CM | POA: Insufficient documentation

## 2015-03-23 DIAGNOSIS — G8929 Other chronic pain: Secondary | ICD-10-CM

## 2015-03-23 DIAGNOSIS — M25559 Pain in unspecified hip: Secondary | ICD-10-CM

## 2015-04-05 ENCOUNTER — Other Ambulatory Visit (HOSPITAL_COMMUNITY): Payer: Self-pay | Admitting: Pulmonary Disease

## 2015-04-05 DIAGNOSIS — R1084 Generalized abdominal pain: Secondary | ICD-10-CM

## 2015-04-06 ENCOUNTER — Telehealth (HOSPITAL_COMMUNITY): Payer: Self-pay

## 2015-04-06 NOTE — Telephone Encounter (Signed)
Patient not feeling well.

## 2015-04-07 ENCOUNTER — Ambulatory Visit (HOSPITAL_COMMUNITY)
Admission: RE | Admit: 2015-04-07 | Discharge: 2015-04-07 | Disposition: A | Discharge status: Home / Self Care | Payer: Medicare Other | Source: Ambulatory Visit | Attending: Pulmonary Disease | Admitting: Pulmonary Disease

## 2015-04-07 ENCOUNTER — Ambulatory Visit (HOSPITAL_COMMUNITY): Payer: Medicare Other

## 2015-04-07 DIAGNOSIS — R1084 Generalized abdominal pain: Secondary | ICD-10-CM | POA: Diagnosis not present

## 2015-04-07 DIAGNOSIS — Z951 Presence of aortocoronary bypass graft: Secondary | ICD-10-CM | POA: Insufficient documentation

## 2015-04-07 DIAGNOSIS — R197 Diarrhea, unspecified: Secondary | ICD-10-CM | POA: Diagnosis not present

## 2015-04-07 DIAGNOSIS — N200 Calculus of kidney: Secondary | ICD-10-CM | POA: Diagnosis not present

## 2015-04-08 ENCOUNTER — Ambulatory Visit (INDEPENDENT_AMBULATORY_CARE_PROVIDER_SITE_OTHER): Payer: Medicare Other | Admitting: Otolaryngology

## 2015-04-08 DIAGNOSIS — H6123 Impacted cerumen, bilateral: Secondary | ICD-10-CM

## 2015-04-08 DIAGNOSIS — H903 Sensorineural hearing loss, bilateral: Secondary | ICD-10-CM | POA: Diagnosis not present

## 2015-04-12 ENCOUNTER — Other Ambulatory Visit: Payer: Self-pay | Admitting: Cardiovascular Disease

## 2015-04-12 ENCOUNTER — Other Ambulatory Visit (HOSPITAL_COMMUNITY): Payer: Self-pay | Admitting: Pulmonary Disease

## 2015-04-12 DIAGNOSIS — I6523 Occlusion and stenosis of bilateral carotid arteries: Secondary | ICD-10-CM

## 2015-04-15 ENCOUNTER — Inpatient Hospital Stay (HOSPITAL_COMMUNITY): Admission: RE | Admit: 2015-04-15 | Payer: Medicare Other | Source: Ambulatory Visit

## 2015-04-20 ENCOUNTER — Telehealth: Payer: Self-pay | Admitting: Internal Medicine

## 2015-04-20 ENCOUNTER — Ambulatory Visit (HOSPITAL_COMMUNITY)
Admission: RE | Admit: 2015-04-20 | Discharge: 2015-04-20 | Disposition: A | Discharge status: Home / Self Care | Payer: Medicare Other | Source: Ambulatory Visit | Attending: Cardiovascular Disease | Admitting: Cardiovascular Disease

## 2015-04-20 DIAGNOSIS — I6523 Occlusion and stenosis of bilateral carotid arteries: Secondary | ICD-10-CM | POA: Diagnosis not present

## 2015-04-20 DIAGNOSIS — I6501 Occlusion and stenosis of right vertebral artery: Secondary | ICD-10-CM | POA: Diagnosis not present

## 2015-04-26 ENCOUNTER — Ambulatory Visit (INDEPENDENT_AMBULATORY_CARE_PROVIDER_SITE_OTHER): Payer: Medicare Other | Admitting: Gastroenterology

## 2015-04-26 ENCOUNTER — Encounter: Payer: Self-pay | Admitting: Gastroenterology

## 2015-04-26 VITALS — BP 140/94 | HR 88 | Temp 97.0°F | Ht 71.0 in | Wt 263.0 lb

## 2015-04-26 DIAGNOSIS — R630 Anorexia: Secondary | ICD-10-CM

## 2015-04-26 DIAGNOSIS — R1030 Lower abdominal pain, unspecified: Secondary | ICD-10-CM

## 2015-04-26 DIAGNOSIS — I6523 Occlusion and stenosis of bilateral carotid arteries: Secondary | ICD-10-CM

## 2015-04-26 DIAGNOSIS — R109 Unspecified abdominal pain: Secondary | ICD-10-CM | POA: Insufficient documentation

## 2015-04-26 DIAGNOSIS — R634 Abnormal weight loss: Secondary | ICD-10-CM | POA: Diagnosis not present

## 2015-04-26 NOTE — Patient Instructions (Signed)
1. Please have your labs done today. 2. Please stop Pepto-Bismol for now. 3. I will discuss your case with Dr. Gala Romney and we will let you know his recommendations.

## 2015-04-26 NOTE — Progress Notes (Signed)
Primary Care Physician:  Alonza Bogus, MD  Primary Gastroenterologist:  Garfield Cornea, MD   Chief Complaint  Patient presents with  . Abdominal Pain  . Weight Loss    HPI:  [name] is a 82 y.o. male here for further evaluation of abdominal pain and weight loss at the request of Dr. Luan Pulling.   Weight down about 20 pounds in the past couple of months. Weight himself every morning and insurance company keeps track. No appetite. Stomach pain intermittent and not necessarily related to food. May go 2-3 days without BM and then has few days of loose stools. Yesterday 3 stools, black. Taking peptobismol, 3 bottles in past 3 weeks for abdominal pain. H/o remote ulcer. Symptoms persistent for few months. See Dr. Hinda Lenis for renal issues. Increased nocturia but is drinking more fluids since he is unable to eat as well. No dysuria. No recent medication additions. He has not bee on pain medication in 3-4 weeks.   CT of the abdomen and pelvis without contrast earlier this month. Stable benign nodular densities in the left lung base. Nonobstructing right renal stones. Appendix normal. Prostatic calcifications noted. Aortoiliac calcifications without aneurysm dilatation.  Current Outpatient Prescriptions  Medication Sig Dispense Refill  . acarbose (PRECOSE) 100 MG tablet Take 100 mg by mouth 3 (three) times daily with meals.    Marland Kitchen allopurinol (ZYLOPRIM) 300 MG tablet Take 150 mg by mouth at bedtime.     Marland Kitchen amLODipine (NORVASC) 10 MG tablet TAKE 1 TABLET BY MOUTH ONCE A DAY. 90 tablet 2  . aspirin EC 81 MG tablet Take 81 mg by mouth every morning.     Marland Kitchen atorvastatin (LIPITOR) 40 MG tablet TAKE 1 TABLET BY MOUTH DAILY. 30 tablet 0  . Cholecalciferol (VITAMIN D3) 1000 UNITS CAPS Take 1 capsule by mouth 2 (two) times daily.     . clopidogrel (PLAVIX) 75 MG tablet TAKE ONE TABLET BY MOUTH ONCE DAILY. 90 tablet 0  . furosemide (LASIX) 40 MG tablet Take 1 tablet by mouth 2 (two) times daily.    Marland Kitchen  gabapentin (NEURONTIN) 300 MG capsule Take 300 mg by mouth 4 (four) times daily as needed (takes three times daily but may take one in between if needed).    Marland Kitchen glipiZIDE (GLUCOTROL) 10 MG tablet Take 10 mg by mouth 2 (two) times daily.     Marland Kitchen HYDROcodone-acetaminophen (NORCO) 10-325 MG per tablet Take 1 tablet by mouth 4 (four) times daily as needed for pain.    Marland Kitchen insulin glargine (LANTUS) 100 UNIT/ML injection Inject 60 Units into the skin 2 (two) times daily.     . isosorbide dinitrate (ISORDIL) 20 MG tablet Take 1 tablet (20 mg total) by mouth 3 (three) times daily. 90 tablet 10  . levothyroxine (SYNTHROID, LEVOTHROID) 150 MCG tablet Take 150 mcg by mouth daily before breakfast.    . mometasone (ELOCON) 0.1 % ointment Apply 1 application topically as directed.     . nitroGLYCERIN (NITROSTAT) 0.4 MG SL tablet Place 1 tablet (0.4 mg total) under the tongue every 5 (five) minutes as needed for chest pain. 25 tablet 2  . omeprazole (PRILOSEC) 20 MG capsule Take 20 mg by mouth at bedtime.     . [name] (JANUVIA) 100 MG tablet Take 50 mg by mouth 2 (two) times daily.     . Tiotropium Bromide Monohydrate (SPIRIVA HANDIHALER IN) Inhale 1 capsule into the lungs every morning.     . triamcinolone cream (KENALOG) 0.1 % Apply 1  application topically daily as needed (for irritation).      No current facility-administered medications for this visit.    Allergies as of 04/26/2015 - Review Complete 04/26/2015  Allergen Reaction Noted  . Demerol Nausea And Vomiting 10/20/2010  . Ranexa [ranolazine] Nausea Only 07/17/2014    Past Medical History  Diagnosis Date  . Diabetes mellitus type II   . Hypothyroidism   . Hypertension   . GERD (gastroesophageal reflux disease)   . Angina   . CHF (congestive heart failure) (H[facility])     diastolic  . Heart attack ([place])   . COPD (chronic obstructive pulmonary disease) ([place])   . Coronary artery disease     CABG '98, cath '08, low risk Nuc 3/14  . Obesity   .  PVD (peripheral vascular disease) ([place])     Lt vertebral stent '98  . Diastolic dysfunction     grade 1 with an EF of 60-65% 4/13  . OSA on CPAP   . High cholesterol   . Chronic bronchitis ([place])     "usually get it q yr" (12/17/2012)  . Exertional shortness of breath   . Arthritis     "knees, hips" (12/17/2012)  . Gout     "ankles, legs" (12/17/2012)  . Pneumonia 05/2011  . CAD, CABG '98, cath '08, low risk Myoview Feb 2012, stable cath 12/19/12 with chronically occluded VG to LCX, patent LIMA-LAD & VG to dominant rt.  06/01/2011    Patent LIMA-LAD, oclluded SVG-CFX, patent SVG-RCA in 2008   . Bradycardia, to 30 12/20/2012  . Nephrolithiasis   . CKD (chronic kidney disease) stage 3, GFR 30-59 ml/min 12/20/2012    Past Surgical History  Procedure Laterality Date  . Back surgery    . Replantation finger Right 2000    "little finger" (12/17/2012)  . Cholecystectomy    . Tonsillectomy  ?1960's  . Knee arthroscopy Left 1990's  . Lumbar laminectomy/decompression microdiscectomy  04/11/2011    Procedure: LUMBAR LAMINECTOMY/DECOMPRESSION MICRODISCECTOMY 1 LEVEL;  Surgeon: Peggyann Shoals, MD;  Location: [facility];  Service: Neurosurgery;  Laterality: Right;  RIGHT Lumbar Three-Four laminectomy with resection of synovial cyst  . Angioplasty  '99, 00, March 06  . Tibia fracture surgery Right ~ 2012  . Coronary artery bypass graft  04/1996    "CABG X6" (12/17/2012)  . Cataract extraction w/ intraocular lens  implant, bilateral    . Lithotripsy      "once" (12/17/2012)  . Cardiac catheterization  April 2006; 12/19/2012  . Coronary angioplasty with stent placement      "I've had a total of 3-4 stents put in" (12/17/2012)  . Fracture surgery    . Colonoscopy  08/09/2004    [id number] diverticula.  Remainder of colonic mucosa appeared normal/normal rectum  . Vertiberal artery stent      1998, 99% blocked.  . Colonoscopy N/A 07/31/2013    [id number] colonic polyp-removed as described  above. Status post segmental biopsy to evaluate for microscopic colitis. bx tubular adenoma, asc colon folcal active colitis/lyphoid aggregates (nonspecific)  . Left heart catheterization with coronary angiogram N/A 12/19/2012    Procedure: LEFT HEART CATHETERIZATION WITH CORONARY ANGIOGRAM;  Surgeon: Lorretta Harp, MD;  Location: [facility];  Service: Cardiovascular;  Laterality: N/A;    Family History  Problem Relation Age of Onset  . Arthritis    . Cancer    . Diabetes    . Colon cancer Neg Hx     Social History  Social History  . Marital Status: Married    Spouse Name: N/A  . Number of Children: N/A  . Years of Education: N/A   Occupational History  . retired    Social History Main Topics  . Smoking status: Former Smoker -- 3.50 packs/day for 40 years    Types: Cigarettes    Quit date: 12/09/1985  . Smokeless tobacco: Former Systems developer    Types: Chew     Comment: 12/17/2012 "quit chewing tobacco in 1998"  . Alcohol Use: Yes     Comment: 12/17/2012 "1-2 beers/wk" - socially   . Drug Use: No  . Sexual Activity: No   Other Topics Concern  . Not on file   Social History Narrative      ROS:  General: Negative for fever, chills. See history of present illness  Eyes: Negative for vision changes.  ENT: Negative for hoarseness, difficulty swallowing , nasal congestion. CV: Negative for chest pain, angina, palpitations, dyspnea on exertion, peripheral edema.  Respiratory: Negative for dyspnea at rest, dyspnea on exertion, cough, sputum, wheezing.  GI: See history of present illness. GU:  Negative for dysuria, hematuria, urinary incontinence, urinary frequency. Positive nocturnal urination.  MS: Positive for joint pain, low back pain.  Derm: Negative for rash or itching.  Neuro: Negative for weakness, abnormal sensation, seizure, frequent headaches, memory loss, confusion.  Psych: Negative for anxiety, depression, suicidal ideation, hallucinations.  Endo: See history  of present illness  Heme: Negative for bruising or bleeding. Allergy: Negative for rash or hives.    Physical Examination:  BP 140/94 mmHg  Pulse 88  Temp(Src) 97 F (36.1 C)  Ht 5\' 11"  (1.803 m)  Wt 263 lb (119.296 kg)  BMI 36.70 kg/m2   General: Elderly white gentleman in no acute distress. Accompanied by wife. Patient is slow to ambulate.  Head: Normocephalic, atraumatic.   Eyes: Conjunctiva pink, no icterus. Mouth: Oropharyngeal mucosa moist and pink , no lesions erythema or exudate. Neck: Supple without thyromegaly, masses, or lymphadenopathy.  Lungs: Clear to auscultation bilaterally.  Heart: Regular rate and rhythm, no murmurs rubs or gallops.  Abdomen: Bowel sounds are normal, mild lower abdominal tenderness, nondistended, no hepatosplenomegaly or masses, no abdominal bruits or    hernia , no rebound or guarding.   Rectal: Not performed Extremities: Trace lower extremity edema. No clubbing or deformities.  Neuro: Alert and oriented x 4 , grossly normal neurologically.  Skin: Warm and dry, no rash or jaundice.   Psych: Alert and cooperative, normal mood and affect.  Labs: Labs from 06/04/2015 White blood cell count 13,200, hemoglobin 13.2, hematocrit 39.5, MCV 94, platelets 281,000, glucose 111, creatinine 1.84, BUN 21.  Imaging Studies: Ct Abdomen Pelvis Wo Contrast  04/08/2015  CLINICAL DATA:  Generalized abdominal pain and diarrhea. EXAM: CT ABDOMEN AND PELVIS WITHOUT CONTRAST TECHNIQUE: Multidetector CT imaging of the abdomen and pelvis was performed following the standard protocol without IV contrast. COMPARISON:  03/24/2008 FINDINGS: Lung bases demonstrate scarring bilaterally. A few tiny nodular densities are noted in the left lower lobe stable from the previous exam consistent with a benign etiology. Changes of prior coronary bypass grafting and coronary stenting are noted. Heavy coronary calcifications are seen. The gallbladder has been surgically removed. The liver,  spleen, adrenal glands and pancreas are within normal limits. The kidneys are well visualized bilaterally. Nonobstructing right renal stones are seen. These measure approximately 2-3 mm in greatest dimension. No obstructive changes are seen. Aortoiliac calcifications are noted without aneurysmal dilatation. The appendix  is well visualized and normal in appearance. The cecum is somewhat mobile. No obstructive changes are seen. Mild diverticular change is noted. Prostatic calcifications are noted. The bladder is well distended without focal abnormality. The osseous structures show degenerative change of the thoracolumbar spine. IMPRESSION: Stable benign nodular densities in the left lung base. Postsurgical changes. Nonobstructing right renal stones Electronically Signed   By: Inez Catalina M.D.   On: 04/08/2015 08:07   Impression/plan:  82 year old gentleman with multiple medical problems as outlined above who presents with several month history of vague lower abdominal pain associated with anorexia, diminished oral intake, 20 pound weight loss. Abdominal pain is not postprandial. Bowel movements swelling from not going having diarrhea. He is utilizing Pepto-Bismol quite frequently over the last 3 weeks and has seen black stool in this timeframe as well. Hemoglobin was normal. He has noted increased nocturnal urination but denies dysuria.  Given vague abdominal pain, weight loss, anorexia etiology is unclear. We need to exclude possibility of UTI, cannot exclude chronic mesenteric ischemia (limited study without contrast, he has multiple risk factors), peptic ulcer disease/gastritis. Explain change in appetite but unlikely to explain his lower discomfort.  Obtain labs and urinalysis with culture. Next step in evaluation planned based on findings, we'll not exclude possibility of need for upper endoscopy. Further recommendations to follow.

## 2015-04-27 ENCOUNTER — Encounter (HOSPITAL_COMMUNITY): Payer: Self-pay | Admitting: Emergency Medicine

## 2015-04-27 ENCOUNTER — Emergency Department (HOSPITAL_COMMUNITY): Payer: Medicare Other

## 2015-04-27 ENCOUNTER — Inpatient Hospital Stay (HOSPITAL_COMMUNITY)
Admission: EM | Admit: 2015-04-27 | Discharge: 2015-05-29 | DRG: 064 | Disposition: E | Payer: Medicare Other | Attending: Pulmonary Disease | Admitting: Pulmonary Disease

## 2015-04-27 DIAGNOSIS — I639 Cerebral infarction, unspecified: Principal | ICD-10-CM | POA: Insufficient documentation

## 2015-04-27 DIAGNOSIS — H9190 Unspecified hearing loss, unspecified ear: Secondary | ICD-10-CM | POA: Diagnosis present

## 2015-04-27 DIAGNOSIS — Z87891 Personal history of nicotine dependence: Secondary | ICD-10-CM

## 2015-04-27 DIAGNOSIS — L409 Psoriasis, unspecified: Secondary | ICD-10-CM | POA: Diagnosis present

## 2015-04-27 DIAGNOSIS — R4182 Altered mental status, unspecified: Secondary | ICD-10-CM | POA: Diagnosis not present

## 2015-04-27 DIAGNOSIS — I5032 Chronic diastolic (congestive) heart failure: Secondary | ICD-10-CM | POA: Diagnosis present

## 2015-04-27 DIAGNOSIS — I130 Hypertensive heart and chronic kidney disease with heart failure and stage 1 through stage 4 chronic kidney disease, or unspecified chronic kidney disease: Secondary | ICD-10-CM | POA: Diagnosis present

## 2015-04-27 DIAGNOSIS — G459 Transient cerebral ischemic attack, unspecified: Secondary | ICD-10-CM | POA: Diagnosis present

## 2015-04-27 DIAGNOSIS — F329 Major depressive disorder, single episode, unspecified: Secondary | ICD-10-CM | POA: Diagnosis present

## 2015-04-27 DIAGNOSIS — Z66 Do not resuscitate: Secondary | ICD-10-CM | POA: Diagnosis present

## 2015-04-27 DIAGNOSIS — R001 Bradycardia, unspecified: Secondary | ICD-10-CM | POA: Diagnosis present

## 2015-04-27 DIAGNOSIS — E7800 Pure hypercholesterolemia, unspecified: Secondary | ICD-10-CM | POA: Diagnosis present

## 2015-04-27 DIAGNOSIS — Z7982 Long term (current) use of aspirin: Secondary | ICD-10-CM

## 2015-04-27 DIAGNOSIS — J449 Chronic obstructive pulmonary disease, unspecified: Secondary | ICD-10-CM | POA: Diagnosis present

## 2015-04-27 DIAGNOSIS — N390 Urinary tract infection, site not specified: Secondary | ICD-10-CM | POA: Diagnosis present

## 2015-04-27 DIAGNOSIS — E039 Hypothyroidism, unspecified: Secondary | ICD-10-CM | POA: Diagnosis present

## 2015-04-27 DIAGNOSIS — F32A Depression, unspecified: Secondary | ICD-10-CM | POA: Diagnosis present

## 2015-04-27 DIAGNOSIS — I252 Old myocardial infarction: Secondary | ICD-10-CM

## 2015-04-27 DIAGNOSIS — E119 Type 2 diabetes mellitus without complications: Secondary | ICD-10-CM

## 2015-04-27 DIAGNOSIS — G4730 Sleep apnea, unspecified: Secondary | ICD-10-CM | POA: Diagnosis present

## 2015-04-27 DIAGNOSIS — Z87442 Personal history of urinary calculi: Secondary | ICD-10-CM

## 2015-04-27 DIAGNOSIS — I441 Atrioventricular block, second degree: Secondary | ICD-10-CM | POA: Diagnosis present

## 2015-04-27 DIAGNOSIS — O368390 Maternal care for abnormalities of the fetal heart rate or rhythm, unspecified trimester, not applicable or unspecified: Secondary | ICD-10-CM

## 2015-04-27 DIAGNOSIS — I10 Essential (primary) hypertension: Secondary | ICD-10-CM | POA: Diagnosis present

## 2015-04-27 DIAGNOSIS — Z79899 Other long term (current) drug therapy: Secondary | ICD-10-CM

## 2015-04-27 DIAGNOSIS — I350 Nonrheumatic aortic (valve) stenosis: Secondary | ICD-10-CM | POA: Diagnosis present

## 2015-04-27 DIAGNOSIS — Z7902 Long term (current) use of antithrombotics/antiplatelets: Secondary | ICD-10-CM

## 2015-04-27 DIAGNOSIS — Z86010 Personal history of colonic polyps: Secondary | ICD-10-CM

## 2015-04-27 DIAGNOSIS — Z951 Presence of aortocoronary bypass graft: Secondary | ICD-10-CM

## 2015-04-27 DIAGNOSIS — G92 Toxic encephalopathy: Secondary | ICD-10-CM | POA: Diagnosis present

## 2015-04-27 DIAGNOSIS — G929 Unspecified toxic encephalopathy: Secondary | ICD-10-CM | POA: Diagnosis present

## 2015-04-27 DIAGNOSIS — I2510 Atherosclerotic heart disease of native coronary artery without angina pectoris: Secondary | ICD-10-CM | POA: Diagnosis present

## 2015-04-27 DIAGNOSIS — K219 Gastro-esophageal reflux disease without esophagitis: Secondary | ICD-10-CM | POA: Diagnosis present

## 2015-04-27 DIAGNOSIS — Z955 Presence of coronary angioplasty implant and graft: Secondary | ICD-10-CM

## 2015-04-27 DIAGNOSIS — E669 Obesity, unspecified: Secondary | ICD-10-CM | POA: Diagnosis present

## 2015-04-27 DIAGNOSIS — Z515 Encounter for palliative care: Secondary | ICD-10-CM | POA: Insufficient documentation

## 2015-04-27 DIAGNOSIS — Z833 Family history of diabetes mellitus: Secondary | ICD-10-CM

## 2015-04-27 DIAGNOSIS — Z8673 Personal history of transient ischemic attack (TIA), and cerebral infarction without residual deficits: Secondary | ICD-10-CM

## 2015-04-27 DIAGNOSIS — Z79891 Long term (current) use of opiate analgesic: Secondary | ICD-10-CM

## 2015-04-27 DIAGNOSIS — F419 Anxiety disorder, unspecified: Secondary | ICD-10-CM | POA: Diagnosis not present

## 2015-04-27 DIAGNOSIS — E1122 Type 2 diabetes mellitus with diabetic chronic kidney disease: Secondary | ICD-10-CM | POA: Diagnosis present

## 2015-04-27 DIAGNOSIS — N183 Chronic kidney disease, stage 3 unspecified: Secondary | ICD-10-CM | POA: Diagnosis present

## 2015-04-27 DIAGNOSIS — I739 Peripheral vascular disease, unspecified: Secondary | ICD-10-CM | POA: Diagnosis present

## 2015-04-27 DIAGNOSIS — G4733 Obstructive sleep apnea (adult) (pediatric): Secondary | ICD-10-CM | POA: Diagnosis present

## 2015-04-27 DIAGNOSIS — Z794 Long term (current) use of insulin: Secondary | ICD-10-CM

## 2015-04-27 DIAGNOSIS — M109 Gout, unspecified: Secondary | ICD-10-CM | POA: Diagnosis present

## 2015-04-27 DIAGNOSIS — Z7189 Other specified counseling: Secondary | ICD-10-CM | POA: Insufficient documentation

## 2015-04-27 DIAGNOSIS — E785 Hyperlipidemia, unspecified: Secondary | ICD-10-CM | POA: Diagnosis present

## 2015-04-27 HISTORY — DX: Essential (primary) hypertension: I10

## 2015-04-27 HISTORY — DX: Chronic diastolic (congestive) heart failure: I50.32

## 2015-04-27 HISTORY — DX: Hyperlipidemia, unspecified: E78.5

## 2015-04-27 LAB — I-STAT TROPONIN, ED: Troponin i, poc: 0.03 ng/mL (ref 0.00–0.08)

## 2015-04-27 LAB — I-STAT CHEM 8, ED
BUN: 38 mg/dL — ABNORMAL HIGH (ref 6–20)
Calcium, Ion: 1.18 mmol/L (ref 1.13–1.30)
Chloride: 103 mmol/L (ref 101–111)
Creatinine, Ser: 1.9 mg/dL — ABNORMAL HIGH (ref 0.61–1.24)
Glucose, Bld: 219 mg/dL — ABNORMAL HIGH (ref 65–99)
HEMATOCRIT: 42 % (ref 39.0–52.0)
HEMOGLOBIN: 14.3 g/dL (ref 13.0–17.0)
POTASSIUM: 6.1 mmol/L — AB (ref 3.5–5.1)
SODIUM: 138 mmol/L (ref 135–145)
TCO2: 27 mmol/L (ref 0–100)

## 2015-04-27 LAB — URINALYSIS, ROUTINE W REFLEX MICROSCOPIC
Bilirubin, UA: NEGATIVE
Glucose, UA: NEGATIVE
Ketones, UA: NEGATIVE
NITRITE UA: NEGATIVE
PH UA: 5.5 (ref 5.0–7.5)
RBC UA: NEGATIVE
Specific Gravity, UA: 1.026 (ref 1.005–1.030)
UUROB: 0.2 mg/dL (ref 0.2–1.0)

## 2015-04-27 LAB — COMPREHENSIVE METABOLIC PANEL
ALBUMIN: 4 g/dL (ref 3.5–4.8)
ALT: 25 IU/L (ref 0–44)
ALT: 18 U/L (ref 17–63)
ANION GAP: 5 (ref 5–15)
AST: 24 IU/L (ref 0–40)
AST: 20 U/L (ref 15–41)
Albumin/Globulin Ratio: 1.1 (ref 1.1–2.5)
Albumin: 3.5 g/dL (ref 3.5–5.0)
Alkaline Phosphatase: 146 IU/L — ABNORMAL HIGH (ref 39–117)
Alkaline Phosphatase: 109 U/L (ref 38–126)
BILIRUBIN TOTAL: 0.9 mg/dL (ref 0.0–1.2)
BUN / CREAT RATIO: 13 (ref 10–22)
BUN: 26 mg/dL (ref 8–27)
BUN: 27 mg/dL — ABNORMAL HIGH (ref 6–20)
CALCIUM: 9.4 mg/dL (ref 8.6–10.2)
CHLORIDE: 99 mmol/L (ref 96–106)
CHLORIDE: 104 mmol/L (ref 101–111)
CO2: 20 mmol/L (ref 18–29)
CO2: 26 mmol/L (ref 22–32)
CREATININE: 1.93 mg/dL — AB (ref 0.61–1.24)
Calcium: 9 mg/dL (ref 8.9–10.3)
Creatinine, Ser: 1.93 mg/dL — ABNORMAL HIGH (ref 0.76–1.27)
GFR calc non Af Amer: 31 mL/min — ABNORMAL LOW (ref >60)
GFR, EST AFRICAN AMERICAN: 37 mL/min/{1.73_m2} — AB (ref >59)
GFR, EST AFRICAN AMERICAN: 36 mL/min — AB (ref >60)
GFR, EST NON AFRICAN AMERICAN: 32 mL/min/{1.73_m2} — AB (ref >59)
GLUCOSE: 168 mg/dL — AB (ref 65–99)
Globulin, Total: 3.6 g/dL (ref 1.5–4.5)
Glucose, Bld: 227 mg/dL — ABNORMAL HIGH (ref 65–99)
POTASSIUM: 4.7 mmol/L (ref 3.5–5.1)
Potassium: 4.5 mmol/L (ref 3.5–5.2)
SODIUM: 135 mmol/L (ref 135–145)
Sodium: 140 mmol/L (ref 134–144)
TOTAL PROTEIN: 7.6 g/dL (ref 6.0–8.5)
Total Bilirubin: 0.8 mg/dL (ref 0.3–1.2)
Total Protein: 7.2 g/dL (ref 6.5–8.1)

## 2015-04-27 LAB — CBC WITH DIFFERENTIAL/PLATELET
BASOS ABS: 0 10*3/uL (ref 0.0–0.2)
BASOS: 0 %
EOS (ABSOLUTE): 0.3 10*3/uL (ref 0.0–0.4)
Eos: 2 %
HEMOGLOBIN: 14.4 g/dL (ref 12.6–17.7)
Hematocrit: 42.9 % (ref 37.5–51.0)
IMMATURE GRANS (ABS): 0.1 10*3/uL (ref 0.0–0.1)
Immature Granulocytes: 1 %
LYMPHS: 15 %
Lymphocytes Absolute: 2 10*3/uL (ref 0.7–3.1)
MCH: 31.2 pg (ref 26.6–33.0)
MCHC: 33.6 g/dL (ref 31.5–35.7)
MCV: 93 fL (ref 79–97)
MONOCYTES: 8 %
Monocytes Absolute: 1 10*3/uL — ABNORMAL HIGH (ref 0.1–0.9)
NEUTROS ABS: 9.8 10*3/uL — AB (ref 1.4–7.0)
Neutrophils: 74 %
Platelets: 325 10*3/uL (ref 150–379)
RBC: 4.62 x10E6/uL (ref 4.14–5.80)
RDW: 16 % — ABNORMAL HIGH (ref 12.3–15.4)
WBC: 13.2 10*3/uL — ABNORMAL HIGH (ref 3.4–10.8)

## 2015-04-27 LAB — DIFFERENTIAL
BASOS PCT: 0 %
Basophils Absolute: 0 10*3/uL (ref 0.0–0.1)
Eosinophils Absolute: 0.3 10*3/uL (ref 0.0–0.7)
Eosinophils Relative: 2 %
Lymphocytes Relative: 16 %
Lymphs Abs: 2.2 10*3/uL (ref 0.7–4.0)
MONO ABS: 1.2 10*3/uL — AB (ref 0.1–1.0)
Monocytes Relative: 9 %
NEUTROS ABS: 9.7 10*3/uL — AB (ref 1.7–7.7)
Neutrophils Relative %: 73 %

## 2015-04-27 LAB — APTT: APTT: 32 s (ref 24–37)

## 2015-04-27 LAB — CBC
HEMATOCRIT: 40 % (ref 39.0–52.0)
HEMOGLOBIN: 13 g/dL (ref 13.0–17.0)
MCH: 31.3 pg (ref 26.0–34.0)
MCHC: 32.5 g/dL (ref 30.0–36.0)
MCV: 96.4 fL (ref 78.0–100.0)
PLATELETS: 273 10*3/uL (ref 150–400)
RBC: 4.15 MIL/uL — ABNORMAL LOW (ref 4.22–5.81)
RDW: 15.2 % (ref 11.5–15.5)
WBC: 13.3 10*3/uL — ABNORMAL HIGH (ref 4.0–10.5)

## 2015-04-27 LAB — MICROSCOPIC EXAMINATION

## 2015-04-27 LAB — TSH: TSH: 0.324 u[IU]/mL — ABNORMAL LOW (ref 0.450–4.500)

## 2015-04-27 LAB — PROTIME-INR
INR: 1.13 (ref 0.00–1.49)
Prothrombin Time: 14.7 seconds (ref 11.6–15.2)

## 2015-04-27 LAB — CBG MONITORING, ED: GLUCOSE-CAPILLARY: 223 mg/dL — AB (ref 65–99)

## 2015-04-27 MED ORDER — LORAZEPAM 1 MG PO TABS
1.0000 mg | ORAL_TABLET | Freq: Three times a day (TID) | ORAL | Status: DC | PRN
  Administered 2015-04-27 – 2015-04-30 (×3): 1 mg via ORAL
  Filled 2015-04-27 (×3): qty 1

## 2015-04-27 MED ORDER — GLIPIZIDE 5 MG PO TABS
10.0000 mg | ORAL_TABLET | Freq: Every day | ORAL | Status: DC
  Administered 2015-04-28 – 2015-04-29 (×2): 10 mg via ORAL
  Filled 2015-04-27 (×2): qty 2

## 2015-04-27 MED ORDER — HEPARIN SODIUM (PORCINE) 5000 UNIT/ML IJ SOLN
5000.0000 [IU] | Freq: Three times a day (TID) | INTRAMUSCULAR | Status: DC
  Administered 2015-04-27 – 2015-05-04 (×20): 5000 [IU] via SUBCUTANEOUS
  Filled 2015-04-27 (×19): qty 1

## 2015-04-27 MED ORDER — ASPIRIN EC 81 MG PO TBEC
81.0000 mg | DELAYED_RELEASE_TABLET | Freq: Every morning | ORAL | Status: DC
  Administered 2015-04-28 – 2015-05-03 (×3): 81 mg via ORAL
  Filled 2015-04-27 (×3): qty 1

## 2015-04-27 MED ORDER — CLOPIDOGREL BISULFATE 75 MG PO TABS
75.0000 mg | ORAL_TABLET | Freq: Every day | ORAL | Status: DC
  Administered 2015-04-28 – 2015-05-03 (×4): 75 mg via ORAL
  Filled 2015-04-27 (×4): qty 1

## 2015-04-27 MED ORDER — STROKE: EARLY STAGES OF RECOVERY BOOK
Freq: Once | Status: AC
  Administered 2015-04-28: 15:00:00
  Filled 2015-04-27: qty 1

## 2015-04-27 MED ORDER — PANTOPRAZOLE SODIUM 40 MG PO TBEC
40.0000 mg | DELAYED_RELEASE_TABLET | Freq: Every day | ORAL | Status: DC
  Administered 2015-04-27 – 2015-05-03 (×5): 40 mg via ORAL
  Filled 2015-04-27 (×5): qty 1

## 2015-04-27 MED ORDER — ACARBOSE 50 MG PO TABS
100.0000 mg | ORAL_TABLET | Freq: Three times a day (TID) | ORAL | Status: DC
  Administered 2015-04-28 – 2015-05-03 (×9): 100 mg via ORAL
  Filled 2015-04-27: qty 1
  Filled 2015-04-27 (×5): qty 2
  Filled 2015-04-27 (×3): qty 1
  Filled 2015-04-27: qty 2
  Filled 2015-04-27: qty 1
  Filled 2015-04-27 (×3): qty 2
  Filled 2015-04-27: qty 1
  Filled 2015-04-27 (×5): qty 2
  Filled 2015-04-27 (×2): qty 1
  Filled 2015-04-27: qty 2

## 2015-04-27 MED ORDER — LEVOTHYROXINE SODIUM 75 MCG PO TABS
150.0000 ug | ORAL_TABLET | Freq: Every day | ORAL | Status: DC
  Administered 2015-04-28 – 2015-05-03 (×4): 150 ug via ORAL
  Filled 2015-04-27 (×4): qty 2

## 2015-04-27 MED ORDER — LINAGLIPTIN 5 MG PO TABS
5.0000 mg | ORAL_TABLET | Freq: Every day | ORAL | Status: DC
  Administered 2015-04-28 – 2015-05-03 (×4): 5 mg via ORAL
  Filled 2015-04-27 (×4): qty 1

## 2015-04-27 MED ORDER — ATORVASTATIN CALCIUM 40 MG PO TABS
40.0000 mg | ORAL_TABLET | Freq: Every day | ORAL | Status: DC
  Administered 2015-04-28 – 2015-04-30 (×3): 40 mg via ORAL
  Filled 2015-04-27 (×4): qty 1

## 2015-04-27 MED ORDER — FUROSEMIDE 40 MG PO TABS
40.0000 mg | ORAL_TABLET | Freq: Two times a day (BID) | ORAL | Status: DC
  Administered 2015-04-28 – 2015-04-29 (×4): 40 mg via ORAL
  Filled 2015-04-27 (×4): qty 1

## 2015-04-27 MED ORDER — INSULIN ASPART 100 UNIT/ML ~~LOC~~ SOLN
0.0000 [IU] | SUBCUTANEOUS | Status: DC
  Administered 2015-04-29: 3 [IU] via SUBCUTANEOUS
  Administered 2015-04-29 – 2015-04-30 (×8): 4 [IU] via SUBCUTANEOUS
  Administered 2015-04-30: 3 [IU] via SUBCUTANEOUS
  Administered 2015-04-30 – 2015-05-01 (×3): 4 [IU] via SUBCUTANEOUS
  Administered 2015-05-01: 3 [IU] via SUBCUTANEOUS
  Administered 2015-05-01 – 2015-05-03 (×9): 4 [IU] via SUBCUTANEOUS
  Administered 2015-05-03: 7 [IU] via SUBCUTANEOUS
  Administered 2015-05-03 – 2015-05-04 (×5): 4 [IU] via SUBCUTANEOUS

## 2015-04-27 MED ORDER — TIOTROPIUM BROMIDE MONOHYDRATE 18 MCG IN CAPS
18.0000 ug | ORAL_CAPSULE | Freq: Every morning | RESPIRATORY_TRACT | Status: DC
  Administered 2015-04-28 – 2015-05-03 (×5): 18 ug via RESPIRATORY_TRACT
  Filled 2015-04-27 (×2): qty 5

## 2015-04-27 MED ORDER — ISOSORBIDE DINITRATE 20 MG PO TABS
20.0000 mg | ORAL_TABLET | Freq: Three times a day (TID) | ORAL | Status: DC
  Administered 2015-04-27 – 2015-05-03 (×13): 20 mg via ORAL
  Filled 2015-04-27 (×13): qty 1

## 2015-04-27 MED ORDER — SENNOSIDES-DOCUSATE SODIUM 8.6-50 MG PO TABS: 1.0000 | ORAL_TABLET | Freq: Every evening | ORAL | Status: DC | PRN

## 2015-04-27 MED ORDER — ALLOPURINOL 300 MG PO TABS
150.0000 mg | ORAL_TABLET | Freq: Every day | ORAL | Status: DC
  Administered 2015-04-27 – 2015-05-03 (×5): 150 mg via ORAL
  Filled 2015-04-27 (×6): qty 1

## 2015-04-27 MED ORDER — GABAPENTIN 300 MG PO CAPS: 300.0000 mg | ORAL_CAPSULE | Freq: Four times a day (QID) | ORAL | Status: DC | PRN

## 2015-04-27 MED ORDER — AMLODIPINE BESYLATE 5 MG PO TABS
10.0000 mg | ORAL_TABLET | Freq: Every day | ORAL | Status: DC
  Administered 2015-04-28 – 2015-05-03 (×4): 10 mg via ORAL
  Filled 2015-04-27 (×4): qty 2

## 2015-04-27 NOTE — Progress Notes (Signed)
CC'ED TO PCP 

## 2015-04-27 NOTE — ED Notes (Signed)
Notified Dr Rogene Houston that pt's HR drops as low as 38. Pt in Sinus Loletha Grayer and is asymptomatic. No new orders given.

## 2015-04-27 NOTE — ED Provider Notes (Addendum)
CSN: [id number]     Arrival date & time 04/05/2015  1735 History   First MD Initiated Contact with Patient 04/17/2015 1747     Chief Complaint  Patient presents with  . Code Stroke    @EDPCLEARED @ (Consider location/radiation/quality/duration/timing/severity/associated sxs/prior Treatment) The history is provided by the patient, the EMS personnel and the spouse.   patient arrived initially by EMS as a code stroke. They were not very clear historian on when last seen normal. They knew that symptoms had started around 4:00 in the afternoon because the patient was driving erratically. Eventually patient's wife arrived and she was with him at that time and she said that they were at a retirement ceremony he was doing fine but she notices they were leaving right around 4:00 that something seemed not to be quite right and he had difficulty driving the car. So onset was around the 4:00 the in the afternoon and that was also the last time he was normal. Patient when he arrived by EMS still was treated as a code stroke Tele neurology was not consult immediately because of this unknown information patient did go to head CT. Head CT results were phoned in as showing no evidence of any acute head bleed or acute stroke which showed evidence of multiple past strokes. Once it was known the last time normal. A formal [place] neurology consult was performed.  According to patient's wife. That they noted the some right facial droop right arm weakness and slurred speech.  Upon arrival here patient speech was still not normal and he seemed confused but had no facial droopiness had no obvious extremity weakness.  After coming back from CT scan there was even further improvement in the patient's speech, but family members stated that it wasn't exactly back to normal. But better than it was prior to him coming here.  In addition patient had a similar episode that lasted maybe about an hour yesterday. Patient is also had a  history of significant weight loss within evaluation by GI medicine ongoing.  Past Medical History  Diagnosis Date  . Diabetes mellitus type II   . Hypothyroidism   . Hypertension   . GERD (gastroesophageal reflux disease)   . Angina   . CHF (congestive heart failure) (HCC)     diastolic  . Heart attack ([place])   . COPD (chronic obstructive pulmonary disease) ([place])   . Coronary artery disease     CABG '98, cath '08, low risk Nuc 3/14  . Obesity   . PVD (peripheral vascular disease) ([place])     Lt vertebral stent '98  . Diastolic dysfunction     grade 1 with an EF of 60-65% 4/13  . OSA on CPAP   . High cholesterol   . Chronic bronchitis ([place])     "usually get it q yr" (12/17/2012)  . Exertional shortness of breath   . Arthritis     "knees, hips" (12/17/2012)  . Gout     "ankles, legs" (12/17/2012)  . Pneumonia 05/2011  . CAD, CABG '98, cath '08, low risk Myoview Feb 2012, stable cath 12/19/12 with chronically occluded VG to LCX, patent LIMA-LAD & VG to dominant rt.  06/01/2011    Patent LIMA-LAD, oclluded SVG-CFX, patent SVG-RCA in 2008   . Bradycardia, to 30 12/20/2012  . Nephrolithiasis   . CKD (chronic kidney disease) stage 3, GFR 30-59 ml/min 12/20/2012   Past Surgical History  Procedure Laterality Date  . Back surgery    .  Replantation finger Right 2000    "little finger" (12/17/2012)  . Cholecystectomy    . Tonsillectomy  ?1960's  . Knee arthroscopy Left 1990's  . Lumbar laminectomy/decompression microdiscectomy  04/11/2011    Procedure: LUMBAR LAMINECTOMY/DECOMPRESSION MICRODISCECTOMY 1 LEVEL;  Surgeon: Peggyann Shoals, MD;  Location: [facility];  Service: Neurosurgery;  Laterality: Right;  RIGHT Lumbar Three-Four laminectomy with resection of synovial cyst  . Angioplasty  '99, 00, March 06  . Tibia fracture surgery Right ~ 2012  . Coronary artery bypass graft  04/1996    "CABG X6" (12/17/2012)  . Cataract extraction w/ intraocular lens  implant, bilateral    .  Lithotripsy      "once" (12/17/2012)  . Cardiac catheterization  April 2006; 12/19/2012  . Coronary angioplasty with stent placement      "I've had a total of 3-4 stents put in" (12/17/2012)  . Fracture surgery    . Colonoscopy  08/09/2004    [id number] diverticula.  Remainder of colonic mucosa appeared normal/normal rectum  . Vertiberal artery stent      1998, 99% blocked.  . Colonoscopy N/A 07/31/2013    [id number] colonic polyp-removed as described above. Status post segmental biopsy to evaluate for microscopic colitis. bx tubular adenoma, asc colon folcal active colitis/lyphoid aggregates (nonspecific)  . Left heart catheterization with coronary angiogram N/A 12/19/2012    Procedure: LEFT HEART CATHETERIZATION WITH CORONARY ANGIOGRAM;  Surgeon: Lorretta Harp, MD;  Location: [facility] CATH LAB;  Service: Cardiovascular;  Laterality: N/A;   Family History  Problem Relation Age of Onset  . Arthritis    . Cancer    . Diabetes    . Colon cancer Neg Hx    Social History  Substance Use Topics  . Smoking status: Former Smoker -- 3.50 packs/day for 40 years    Types: Cigarettes    Quit date: 12/09/1985  . Smokeless tobacco: Former Systems developer    Types: Chew     Comment: 12/17/2012 "quit chewing tobacco in 1998"  . Alcohol Use: Yes     Comment: 12/17/2012 "1-2 beers/wk" - socially     Review of Systems  Constitutional: Positive for appetite change. Negative for fever.  HENT: Negative for congestion.   Eyes: Negative for visual disturbance.  Respiratory: Negative for shortness of breath.   Cardiovascular: Positive for chest pain.  Gastrointestinal: Negative for nausea, vomiting and abdominal pain.  Genitourinary: Negative for dysuria.  Musculoskeletal: Negative for back pain and neck pain.  Skin: Negative for rash.  Neurological: Positive for facial asymmetry, speech difficulty and weakness. Negative for seizures, syncope and headaches.  Hematological: Does not bruise/bleed easily.   Psychiatric/Behavioral: Positive for confusion.      Allergies  Demerol and Ranexa  Home Medications   Prior to Admission medications   Medication Sig Start Date End Date Taking? Authorizing Provider  allopurinol (ZYLOPRIM) 300 MG tablet Take 150 mg by mouth at bedtime.    Yes Historical Provider, MD  atorvastatin (LIPITOR) 40 MG tablet TAKE 1 TABLET BY MOUTH DAILY. 02/04/15  Yes Lorretta Harp, MD  ciprofloxacin-fluocinolone PF (OTOVEL) 0.3-0.025 % SOLN Place 0.25 mLs in ear(s) 2 (two) times daily as needed (for drainage).   Yes Historical Provider, MD  clopidogrel (PLAVIX) 75 MG tablet TAKE ONE TABLET BY MOUTH ONCE DAILY. 11/19/14  Yes Lorretta Harp, MD  furosemide (LASIX) 40 MG tablet Take 1 tablet by mouth 2 (two) times daily. 03/13/14  Yes Historical Provider, MD  gabapentin (NEURONTIN) 300 MG capsule Take  300 mg by mouth 4 (four) times daily as needed (takes three times daily but may take one in between if needed).   Yes Historical Provider, MD  glipiZIDE (GLUCOTROL) 10 MG tablet Take 10 mg by mouth daily.    Yes Historical Provider, MD  HYDROcodone-acetaminophen (NORCO) 10-325 MG per tablet Take 1 tablet by mouth 4 (four) times daily as needed for pain.   Yes Historical Provider, MD  isosorbide dinitrate (ISORDIL) 20 MG tablet Take 1 tablet (20 mg total) by mouth 3 (three) times daily. 03/08/13  Yes Lorretta Harp, MD  levothyroxine (SYNTHROID, LEVOTHROID) 150 MCG tablet Take 150 mcg by mouth daily before breakfast.   Yes Historical Provider, MD  acarbose (PRECOSE) 100 MG tablet Take 100 mg by mouth 3 (three) times daily with meals.    Historical Provider, MD  amLODipine (NORVASC) 10 MG tablet TAKE 1 TABLET BY MOUTH ONCE A DAY. 11/12/13   Lorretta Harp, MD  aspirin EC 81 MG tablet Take 81 mg by mouth every morning.     Historical Provider, MD  Cholecalciferol (VITAMIN D3) 1000 UNITS CAPS Take 1 capsule by mouth 2 (two) times daily.     Historical Provider, MD  insulin glargine  (LANTUS) 100 UNIT/ML injection Inject 60 Units into the skin 2 (two) times daily.     Historical Provider, MD  mometasone (ELOCON) 0.1 % ointment Apply 1 application topically as directed.  03/13/13   Historical Provider, MD  nitroGLYCERIN (NITROSTAT) 0.4 MG SL tablet Place 1 tablet (0.4 mg total) under the tongue every 5 (five) minutes as needed for chest pain. 12/09/12   Erlene Quan, PA-C  omeprazole (PRILOSEC) 20 MG capsule Take 20 mg by mouth at bedtime.     Historical Provider, MD  [name] (JANUVIA) 100 MG tablet Take 50 mg by mouth 2 (two) times daily.     Historical Provider, MD  Tiotropium Bromide Monohydrate (SPIRIVA HANDIHALER IN) Inhale 1 capsule into the lungs every morning.     Historical Provider, MD  triamcinolone cream (KENALOG) 0.1 % Apply 1 application topically daily as needed (for irritation).  09/25/12   Historical Provider, MD   BP 141/84 mmHg  Pulse 47  Temp(Src) 98.2 F (36.8 C) (Oral)  Resp 10  Wt 119.296 kg  SpO2 98% Physical Exam  Constitutional: He appears well-developed and well-nourished. No distress.  HENT:  Head: Normocephalic and atraumatic.  Mouth/Throat: Oropharynx is clear and moist.  No facial droop.  Eyes: Conjunctivae and EOM are normal. Pupils are equal, round, and reactive to light.  Neck: Normal range of motion. Neck supple.  Cardiovascular: Normal rate, regular rhythm and normal heart sounds.   Pulmonary/Chest: Effort normal and breath sounds normal. No respiratory distress.  Abdominal: Soft. Bowel sounds are normal. There is no tenderness.  Musculoskeletal: Normal range of motion.  Neurological: He is alert. He exhibits normal muscle tone. Coordination normal.  No facial droop slurred speech. No significant upper or lower extremity weakness. Patient with confusion.  Skin: No rash noted.  Nursing note and vitals reviewed.   ED Course  Procedures (including critical care time) Labs Review Labs Reviewed  CBC - Abnormal; Notable for the  following:    WBC 13.3 (*)    RBC 4.15 (*)    All other components within normal limits  DIFFERENTIAL - Abnormal; Notable for the following:    Neutro Abs 9.7 (*)    Monocytes Absolute 1.2 (*)    All other components within normal limits  COMPREHENSIVE METABOLIC PANEL - Abnormal; Notable for the following:    Glucose, Bld 227 (*)    BUN 27 (*)    Creatinine, Ser 1.93 (*)    GFR calc non Af Amer 31 (*)    GFR calc Af Amer 36 (*)    All other components within normal limits  CBG MONITORING, ED - Abnormal; Notable for the following:    Glucose-Capillary 223 (*)    All other components within normal limits  I-STAT CHEM 8, ED - Abnormal; Notable for the following:    Potassium 6.1 (*)    BUN 38 (*)    Creatinine, Ser 1.90 (*)    Glucose, Bld 219 (*)    All other components within normal limits  PROTIME-INR  APTT  I-STAT TROPOININ, ED   Results for orders placed or performed during the hospital encounter of 04/12/2015  Protime-INR  Result Value Ref Range   Prothrombin Time 14.7 11.6 - 15.2 seconds   INR 1.13 0.00 - 1.49  APTT  Result Value Ref Range   aPTT 32 24 - 37 seconds  CBC  Result Value Ref Range   WBC 13.3 (H) 4.0 - 10.5 K/uL   RBC 4.15 (L) 4.22 - 5.81 MIL/uL   Hemoglobin 13.0 13.0 - 17.0 g/dL   HCT 40.0 39.0 - 52.0 %   MCV 96.4 78.0 - 100.0 fL   MCH 31.3 26.0 - 34.0 pg   MCHC 32.5 30.0 - 36.0 g/dL   RDW 15.2 11.5 - 15.5 %   Platelets 273 150 - 400 K/uL  Differential  Result Value Ref Range   Neutrophils Relative % 73 %   Neutro Abs 9.7 (H) 1.7 - 7.7 K/uL   Lymphocytes Relative 16 %   Lymphs Abs 2.2 0.7 - 4.0 K/uL   Monocytes Relative 9 %   Monocytes Absolute 1.2 (H) 0.1 - 1.0 K/uL   Eosinophils Relative 2 %   Eosinophils Absolute 0.3 0.0 - 0.7 K/uL   Basophils Relative 0 %   Basophils Absolute 0.0 0.0 - 0.1 K/uL  Comprehensive metabolic panel  Result Value Ref Range   Sodium 135 135 - 145 mmol/L   Potassium 4.7 3.5 - 5.1 mmol/L   Chloride 104 101 - 111  mmol/L   CO2 26 22 - 32 mmol/L   Glucose, Bld 227 (H) 65 - 99 mg/dL   BUN 27 (H) 6 - 20 mg/dL   Creatinine, Ser 1.93 (H) 0.61 - 1.24 mg/dL   Calcium 9.0 8.9 - 10.3 mg/dL   Total Protein 7.2 6.5 - 8.1 g/dL   Albumin 3.5 3.5 - 5.0 g/dL   AST 20 15 - 41 U/L   ALT 18 17 - 63 U/L   Alkaline Phosphatase 109 38 - 126 U/L   Total Bilirubin 0.8 0.3 - 1.2 mg/dL   GFR calc non Af Amer 31 (L) >60 mL/min   GFR calc Af Amer 36 (L) >60 mL/min   Anion gap 5 5 - 15  I-stat troponin, ED (not at [facility], [facility])  Result Value Ref Range   Troponin i, poc 0.03 0.00 - 0.08 ng/mL   Comment 3          CBG monitoring, ED  Result Value Ref Range   Glucose-Capillary 223 (H) 65 - 99 mg/dL  I-Stat Chem 8, ED  (not at [facility])  Result Value Ref Range   Sodium 138 135 - 145 mmol/L   Potassium 6.1 (HH) 3.5 - 5.1 mmol/L  Chloride 103 101 - 111 mmol/L   BUN 38 (H) 6 - 20 mg/dL   Creatinine, Ser 1.90 (H) 0.61 - 1.24 mg/dL   Glucose, Bld 219 (H) 65 - 99 mg/dL   Calcium, Ion 1.18 1.13 - 1.30 mmol/L   TCO2 27 0 - 100 mmol/L   Hemoglobin 14.3 13.0 - 17.0 g/dL   HCT 42.0 39.0 - 52.0 %     Imaging Review Ct Head Wo Contrast  04/18/2015  CLINICAL DATA:  Code stroke.  Confusion.  Right-sided droop. EXAM: CT HEAD WITHOUT CONTRAST TECHNIQUE: Contiguous axial images were obtained from the base of the skull through the vertex without intravenous contrast. COMPARISON:  06/16/2013 FINDINGS: There is mild diffuse low-attenuation within the subcortical and periventricular white matter compatible with chronic microvascular disease. Encephalomalacia within the left frontal lobe is again noted compatible with previous infarct. Right posterior parietal encephalomalacia is also again noted. Progressive encephalomalacia involving the right frontal lobe is noted. Chronic infarcts involving bilateral cerebellar hemispheres are identified, image 12 of series 2. No evidence for acute intracranial hemorrhage, mass or acute cortical infarct.  The paranasal sinuses are clear. The mastoid air cells are clear. The calvarium is intact. IMPRESSION: 1. No acute intracranial abnormalities. 2. Advanced small vessel ischemic disease 3. Chronic bilateral cerebral and cerebellar infarcts. Electronically Signed   By: Kerby Moors M.D.   On: 04/21/2015 17:55   I have personally reviewed and evaluated these images and lab results as part of my medical decision-making.   EKG Interpretation   Date/Time:  Tuesday April 27 2015 17:51:57 EST Ventricular Rate:  44 PR Interval:  373 QRS Duration: 116 QT Interval:  475 QTC Calculation: 406 R Axis:   45 Text Interpretation:  Sinus bradycardia Prolonged PR interval Nonspecific  intraventricular conduction delay Confirmed by Leshia Kope  MD, Meshell Abdulaziz  [phone]) on 04/16/2015 5:53:50 PM       CRITICAL CARE Performed by: Fredia Sorrow Total critical care time: 30 minutes Critical care time was exclusive of separately billable procedures and treating other patients. Critical care was necessary to treat or prevent imminent or life-threatening deterioration. Critical care was time spent personally by me on the following activities: development of treatment plan with patient and/or surrogate as well as nursing, discussions with consultants, evaluation of patient's response to treatment, examination of patient, obtaining history from patient or surrogate, ordering and performing treatments and interventions, ordering and review of laboratory studies, ordering and review of radiographic studies, pulse oximetry and re-evaluation of patient's condition.    MDM   Final diagnoses:  Cerebral infarction due to unspecified mechanism  Bradycardic baseline fetal heart rate   Patients with symptoms consistent with a stroke. Rapid improvement in symptoms. Head CT negative for acute event but shows evidence of several past events. Patient evaluated by telephone neurology. They recommend MRI. The patient requires  sedation for MRI so we'll do that as an inpatient. Patient symptoms have now completely resolved. Discussed with hospitalist regarding admission.    Fredia Sorrow, MD 04/01/2015 1945  Fredia Sorrow, MD 04/03/2015 [place], MD 04/09/2015 [id number]

## 2015-04-27 NOTE — ED Notes (Signed)
Confirmed LKW [id number]

## 2015-04-27 NOTE — ED Notes (Signed)
Beeped out Code Stroke 

## 2015-04-27 NOTE — Progress Notes (Signed)
Call code stroke in route [id number] beeper states code stroke 10 mintues out 1733 call patient herre 1740 pt on table lab drawing labs 1748 complete 1749 [place] radiology

## 2015-04-27 NOTE — H&P (Signed)
[facility] History and Physical  [name] [id number] DOB: 24-Aug-1933    PCP:   Alonza Bogus, MD   Chief Complaint: Right sided weakness and slurred speech with ictus 1.5 hours prior to ER arrival.   HPI: [name] is an right handed 82 y.o. male severe vasculopath, with hx of CAD s/p CABG, ICA disease s/p PTCA of vetebral artery many years ago, hx of anxiety, DM, hypothryroidism, HTN, OSA with CPAP use, obesity, diastolic CHF, already on dual antiplatelet therapy, presented to the ER with ictus 1.5 hours prior to arrival, having slurred speech, right sided weakness, and right facial droop.  In the ER, his symptoms resolved, and when I saw him, he had no residual symptom.  Code stroke was called, and teleneuropathy did not recommend TPA due to improvement of symptoms.  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  His serology was unremarkable except for Cr of 1.9.  K varied between 4.7 and 6.1 mEq. And Hb of 14 g per dL. His SBP was 160.  Hospitalist was asked to admit him for further TIA/CVA work up.  He has anxiety and feels he will need tranquilizer for his MRI studies.  Rewiew of Systems:  Constitutional: Negative for malaise, fever and chills. No significant weight loss or weight gain Eyes: Negative for eye pain, redness and discharge, diplopia, visual changes, or flashes of light. ENMT: Negative for ear pain, hoarseness, nasal congestion, sinus pressure and sore throat. No headaches; tinnitus, drooling, or problem swallowing. Cardiovascular: Negative for chest pain, palpitations, diaphoresis, dyspnea and peripheral edema. ; No orthopnea, PND Respiratory: Negative for cough, hemoptysis, wheezing and stridor. No pleuritic chestpain. Gastrointestinal: Negative for nausea, vomiting, diarrhea, constipation, abdominal pain, melena, blood in stool, hematemesis, jaundice and rectal bleeding.    Genitourinary: Negative for frequency, dysuria,  incontinence,flank pain and hematuria; Musculoskeletal: Negative for back pain and neck pain. Negative for swelling and trauma.;  Skin: . Negative for pruritus, rash, abrasions, bruising and skin lesion.; ulcerations Neuro: Negative for headache, lightheadedness and neck stiffness. Negative for weakness, altered level of consciousness , altered mental status, extremity weakness, burning feet, involuntary movement, seizure and syncope.  Psych: negative for anxiety, depression, insomnia, tearfulness, panic attacks, hallucinations, paranoia, suicidal or homicidal ideation    Past Medical History  Diagnosis Date  . Diabetes mellitus type II   . Hypothyroidism   . Hypertension   . GERD (gastroesophageal reflux disease)   . Angina   . CHF (congestive heart failure) (HCC)     diastolic  . Heart attack ([place])   . COPD (chronic obstructive pulmonary disease) ([place])   . Coronary artery disease     CABG '98, cath '08, low risk Nuc 3/14  . Obesity   . PVD (peripheral vascular disease) ([place])     Lt vertebral stent '98  . Diastolic dysfunction     grade 1 with an EF of 60-65% 4/13  . OSA on CPAP   . High cholesterol   . Chronic bronchitis ([place])     "usually get it q yr" (12/17/2012)  . Exertional shortness of breath   . Arthritis     "knees, hips" (12/17/2012)  . Gout     "ankles, legs" (12/17/2012)  . Pneumonia 05/2011  . CAD, CABG '98, cath '08, low risk Myoview Feb 2012, stable cath 12/19/12 with chronically occluded VG to LCX, patent LIMA-LAD & VG to dominant rt.  06/01/2011    Patent LIMA-LAD, oclluded SVG-CFX, patent  SVG-RCA in 2008   . Bradycardia, to 30 12/20/2012  . Nephrolithiasis   . CKD (chronic kidney disease) stage 3, GFR 30-59 ml/min 12/20/2012    Past Surgical History  Procedure Laterality Date  . Back surgery    . Replantation finger Right 2000    "little finger" (12/17/2012)  . Cholecystectomy    . Tonsillectomy  ?1960's  . Knee arthroscopy Left 1990's  . Lumbar  laminectomy/decompression microdiscectomy  04/11/2011    Procedure: LUMBAR LAMINECTOMY/DECOMPRESSION MICRODISCECTOMY 1 LEVEL;  Surgeon: Peggyann Shoals, MD;  Location: [facility];  Service: Neurosurgery;  Laterality: Right;  RIGHT Lumbar Three-Four laminectomy with resection of synovial cyst  . Angioplasty  '99, 00, March 06  . Tibia fracture surgery Right ~ 2012  . Coronary artery bypass graft  04/1996    "CABG X6" (12/17/2012)  . Cataract extraction w/ intraocular lens  implant, bilateral    . Lithotripsy      "once" (12/17/2012)  . Cardiac catheterization  April 2006; 12/19/2012  . Coronary angioplasty with stent placement      "I've had a total of 3-4 stents put in" (12/17/2012)  . Fracture surgery    . Colonoscopy  08/09/2004    [id number] diverticula.  Remainder of colonic mucosa appeared normal/normal rectum  . Vertiberal artery stent      1998, 99% blocked.  . Colonoscopy N/A 07/31/2013    [id number] colonic polyp-removed as described above. Status post segmental biopsy to evaluate for microscopic colitis. bx tubular adenoma, asc colon folcal active colitis/lyphoid aggregates (nonspecific)  . Left heart catheterization with coronary angiogram N/A 12/19/2012    Procedure: LEFT HEART CATHETERIZATION WITH CORONARY ANGIOGRAM;  Surgeon: Lorretta Harp, MD;  Location: [facility] CATH LAB;  Service: Cardiovascular;  Laterality: N/A;    Medications:  HOME MEDS: Prior to Admission medications   Medication Sig Start Date End Date Taking? Authorizing Provider  allopurinol (ZYLOPRIM) 300 MG tablet Take 150 mg by mouth at bedtime.    Yes Historical Provider, MD  atorvastatin (LIPITOR) 40 MG tablet TAKE 1 TABLET BY MOUTH DAILY. 02/04/15  Yes Lorretta Harp, MD  ciprofloxacin-fluocinolone PF (OTOVEL) 0.3-0.025 % SOLN Place 0.25 mLs in ear(s) 2 (two) times daily as needed (for drainage).   Yes Historical Provider, MD  clopidogrel (PLAVIX) 75 MG tablet TAKE ONE TABLET BY MOUTH ONCE DAILY. 11/19/14  Yes  Lorretta Harp, MD  furosemide (LASIX) 40 MG tablet Take 1 tablet by mouth 2 (two) times daily. 03/13/14  Yes Historical Provider, MD  gabapentin (NEURONTIN) 300 MG capsule Take 300 mg by mouth 4 (four) times daily as needed (takes three times daily but may take one in between if needed).   Yes Historical Provider, MD  glipiZIDE (GLUCOTROL) 10 MG tablet Take 10 mg by mouth daily.    Yes Historical Provider, MD  HYDROcodone-acetaminophen (NORCO) 10-325 MG per tablet Take 1 tablet by mouth 4 (four) times daily as needed for pain.   Yes Historical Provider, MD  insulin glargine (LANTUS) 100 UNIT/ML injection Inject 60 Units into the skin 2 (two) times daily.    Yes Historical Provider, MD  isosorbide dinitrate (ISORDIL) 20 MG tablet Take 1 tablet (20 mg total) by mouth 3 (three) times daily. 03/08/13  Yes Lorretta Harp, MD  levothyroxine (SYNTHROID, LEVOTHROID) 150 MCG tablet Take 150 mcg by mouth daily before breakfast.   Yes Historical Provider, MD  nitroGLYCERIN (NITROSTAT) 0.4 MG SL tablet Place 1 tablet (0.4 mg total) under the tongue every  5 (five) minutes as needed for chest pain. 12/09/12  Yes [name], PA-C  omeprazole (PRILOSEC) 20 MG capsule Take 20 mg by mouth at bedtime.    Yes Historical Provider, MD  [name] (JANUVIA) 100 MG tablet Take 50 mg by mouth 2 (two) times daily.    Yes Historical Provider, MD  Tiotropium Bromide Monohydrate (SPIRIVA HANDIHALER IN) Inhale 1 capsule into the lungs every morning.    Yes Historical Provider, MD  amLODipine (NORVASC) 10 MG tablet TAKE 1 TABLET BY MOUTH ONCE A DAY. 11/12/13   Lorretta Harp, MD  aspirin EC 81 MG tablet Take 81 mg by mouth every morning.     Historical Provider, MD  Cholecalciferol (VITAMIN D3) 1000 UNITS CAPS Take 1 capsule by mouth 2 (two) times daily.     Historical Provider, MD  mometasone (ELOCON) 0.1 % ointment Apply 1 application topically as directed.  03/13/13   Historical Provider, MD  triamcinolone cream  (KENALOG) 0.1 % Apply 1 application topically daily as needed (for irritation).  09/25/12   Historical Provider, MD     Allergies:  Allergies  Allergen Reactions  . Demerol Nausea And Vomiting  . Ranexa [Ranolazine] Nausea Only    dizziness    Social History:   reports that he quit smoking about 29 years ago. His smoking use included Cigarettes. He has a 140 pack-year smoking history. He has quit using smokeless tobacco. His smokeless tobacco use included Chew. He reports that he drinks alcohol. He reports that he does not use illicit drugs.  Family History: Family History  Problem Relation Age of Onset  . Arthritis    . Cancer    . Diabetes    . Colon cancer Neg Hx      Physical Exam: Filed Vitals:   03/31/2015 1922 04/16/2015 1930 04/01/2015 1945 04/23/2015 2000  BP:  141/84 150/77   Pulse:  47 47 48  Temp: 98.2 F (36.8 C)     TempSrc:      Resp:  10    Weight:      SpO2:  98% 99% 100%   Blood pressure 150/77, pulse 48, temperature 98.2 F (36.8 C), temperature source Oral, resp. rate 10, weight 119.296 kg (263 lb), SpO2 100 %.  GEN:    patient lying in the stretcher in no acute distress; cooperative with exam.  Anxious.  PSYCH:  alert and oriented x4; does not appear anxious or depressed; affect is appropriate. HEENT: Mucous membranes pink and anicteric; PERRLA; EOM intact; no cervical lymphadenopathy nor thyromegaly or carotid bruit; no JVD; There were no stridor. Neck is very supple. Breasts:: Not examined CHEST WALL: No tenderness CHEST: Normal respiration, clear to auscultation bilaterally.  HEART: Regular rate and rhythm.  There are no murmur, rub, or gallops.   BACK: No kyphosis or scoliosis; no CVA tenderness ABDOMEN: soft and non-tender; no masses, no organomegaly, normal abdominal bowel sounds; no pannus; no intertriginous candida. There is no rebound and no distention. Rectal Exam: Not done EXTREMITIES: No bone or joint deformity; age-appropriate arthropathy of  the hands and knees; no edema; no ulcerations.  There is no calf tenderness. Genitalia: not examined PULSES: 2+ and symmetric SKIN: Normal hydration no rash or ulceration CNS: Cranial nerves 2-12 grossly intact no focal lateralizing neurologic deficit.  Speech is fluent; uvula elevated with phonation, facial symmetry and tongue midline. DTR are normal bilaterally, cerebella exam is intact, barbinski is negative and strengths are equaled bilaterally.  No sensory loss.  Labs on Admission:  Basic Metabolic Panel:  Recent Labs Lab 04/26/15 0928 04/26/2015 1741 04/06/2015 1800  NA 140 135 138  K 4.5 4.7 6.1*  CL 99 104 103  CO2 20 26  --   GLUCOSE 168* 227* 219*  BUN 26 27* 38*  CREATININE 1.93* 1.93* 1.90*  CALCIUM 9.4 9.0  --    Liver Function Tests:  Recent Labs Lab 04/26/15 0928 04/25/2015 1741  AST 24 20  ALT 25 18  ALKPHOS 146* 109  BILITOT 0.9 0.8  PROT 7.6 7.2  ALBUMIN 4.0 3.5   CBC:  Recent Labs Lab 04/26/15 0928 04/16/2015 1741 04/06/2015 1800  WBC 13.2* 13.3*  --   NEUTROABS 9.8* 9.7*  --   HGB  --  13.0 14.3  HCT 42.9 40.0 42.0  MCV 93 96.4  --   PLT 325 273  --     CBG:  Recent Labs Lab 04/11/2015 1752  GLUCAP 223*     Radiological Exams on Admission: Ct Head Wo Contrast  04/14/2015  CLINICAL DATA:  Code stroke.  Confusion.  Right-sided droop. EXAM: CT HEAD WITHOUT CONTRAST TECHNIQUE: Contiguous axial images were obtained from the base of the skull through the vertex without intravenous contrast. COMPARISON:  06/16/2013 FINDINGS: There is mild diffuse low-attenuation within the subcortical and periventricular white matter compatible with chronic microvascular disease. Encephalomalacia within the left frontal lobe is again noted compatible with previous infarct. Right posterior parietal encephalomalacia is also again noted. Progressive encephalomalacia involving the right frontal lobe is noted. Chronic infarcts involving bilateral cerebellar hemispheres are  identified, image 12 of series 2. No evidence for acute intracranial hemorrhage, mass or acute cortical infarct. The paranasal sinuses are clear. The mastoid air cells are clear. The calvarium is intact. IMPRESSION: 1. No acute intracranial abnormalities. 2. Advanced small vessel ischemic disease 3. Chronic bilateral cerebral and cerebellar infarcts. Electronically Signed   By: Kerby Moors M.D.   On: 04/15/2015 17:55    EKG: Independently reviewed.    Assessment/Plan Present on Admission:  . TIA (transient ischemic attack) . CAD, CABG '98, cath '08, low risk Myoview Feb 2012, stable cath 12/19/12 with chronically occluded VG to LCX, patent LIMA-LAD & VG to dominant rt.  . Chronic diastolic heart failure ([place]) . CKD (chronic kidney disease) stage 3, GFR 30-59 ml/min . Depression . HTN (hypertension) . Hyperlipidemia . Obesity . Hypothyroidism . Sleep apnea, on C-pap . PVD, Lt Vertebral artery PTA in '08, known moderate ICA disease  PLAN:  TIA/CVA:  Tx option is quite limited.  He is already on DUAT, and will continue ASA and Plavix.  Will obtain MRI without contrast of the brain and MRA of the cerebral arteries.  Will obtain US of his carotid, as he has known carotid disease, though intervention is unlikely.  Will allow permissive HTN.  Continue with his Lipitor, and keep his BP under control.  Admit to OBS under Dr Luan Pulling.   DM:  Will continue with SSI.  Check HgA1C.   HTN:  Again, will keep SBP 160 to 180 for acute TIA/CVA.  HLD:  Will continue statin.  OSA:  Will continue with CPAP.   Anxiety:  Will start Ativan.   Other plans as per orders. Code Status: FULL CODE>    Orvan Falconer, MD. FACP [facility] Pager [phone] 7pm to 7am.  04/20/2015, 8:20 PM

## 2015-04-27 NOTE — ED Notes (Signed)
Called SOC. 

## 2015-04-27 NOTE — ED Notes (Signed)
teleneuro in process.
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
## 2015-04-27 NOTE — ED Notes (Signed)
Pt taken to CT.

## 2015-04-27 NOTE — Progress Notes (Signed)
Quick Note:  Slight leukocytosis and U/A with likely UTI with WBC in urine.   1#Start abx but may need to adjust once culture and sensitivities are back.  Please send in RX for levaquin 250mg  tablets, #8. Take two on day 1, then 250mg  daily for next 6 days.  2# repeat LFTs and TSH in 4 weeks.   ______
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
## 2015-04-27 NOTE — ED Notes (Signed)
Pt c/o h/a at this time.

## 2015-04-28 ENCOUNTER — Other Ambulatory Visit (HOSPITAL_COMMUNITY): Payer: Medicare Other

## 2015-04-28 ENCOUNTER — Observation Stay (HOSPITAL_COMMUNITY): Payer: Medicare Other

## 2015-04-28 ENCOUNTER — Observation Stay (HOSPITAL_BASED_OUTPATIENT_CLINIC_OR_DEPARTMENT_OTHER): Payer: Medicare Other

## 2015-04-28 ENCOUNTER — Observation Stay (HOSPITAL_COMMUNITY)
Admit: 2015-04-28 | Discharge: 2015-04-28 | Disposition: A | Discharge status: Home / Self Care | Payer: Medicare Other | Attending: Pulmonary Disease | Admitting: Pulmonary Disease

## 2015-04-28 ENCOUNTER — Encounter (HOSPITAL_COMMUNITY): Payer: Self-pay | Admitting: Cardiology

## 2015-04-28 DIAGNOSIS — I5032 Chronic diastolic (congestive) heart failure: Secondary | ICD-10-CM

## 2015-04-28 DIAGNOSIS — G459 Transient cerebral ischemic attack, unspecified: Secondary | ICD-10-CM

## 2015-04-28 DIAGNOSIS — I441 Atrioventricular block, second degree: Secondary | ICD-10-CM | POA: Diagnosis not present

## 2015-04-28 DIAGNOSIS — N183 Chronic kidney disease, stage 3 (moderate): Secondary | ICD-10-CM

## 2015-04-28 DIAGNOSIS — I739 Peripheral vascular disease, unspecified: Secondary | ICD-10-CM

## 2015-04-28 DIAGNOSIS — I25709 Atherosclerosis of coronary artery bypass graft(s), unspecified, with unspecified angina pectoris: Secondary | ICD-10-CM | POA: Diagnosis not present

## 2015-04-28 DIAGNOSIS — G4730 Sleep apnea, unspecified: Secondary | ICD-10-CM

## 2015-04-28 DIAGNOSIS — I350 Nonrheumatic aortic (valve) stenosis: Secondary | ICD-10-CM

## 2015-04-28 LAB — GLUCOSE, CAPILLARY
GLUCOSE-CAPILLARY: 109 mg/dL — AB (ref 65–99)
GLUCOSE-CAPILLARY: 67 mg/dL (ref 65–99)
Glucose-Capillary: 54 mg/dL — ABNORMAL LOW (ref 65–99)
Glucose-Capillary: 80 mg/dL (ref 65–99)
Glucose-Capillary: 90 mg/dL (ref 65–99)

## 2015-04-28 LAB — BASIC METABOLIC PANEL
ANION GAP: 9 (ref 5–15)
BUN: 25 mg/dL — ABNORMAL HIGH (ref 6–20)
CO2: 24 mmol/L (ref 22–32)
Calcium: 8.9 mg/dL (ref 8.9–10.3)
Chloride: 107 mmol/L (ref 101–111)
Creatinine, Ser: 1.72 mg/dL — ABNORMAL HIGH (ref 0.61–1.24)
GFR, EST AFRICAN AMERICAN: 42 mL/min — AB (ref >60)
GFR, EST NON AFRICAN AMERICAN: 36 mL/min — AB (ref >60)
GLUCOSE: 53 mg/dL — AB (ref 65–99)
POTASSIUM: 4.2 mmol/L (ref 3.5–5.1)
SODIUM: 140 mmol/L (ref 135–145)

## 2015-04-28 LAB — LIPID PANEL
CHOL/HDL RATIO: 4.8 ratio
Cholesterol: 119 mg/dL (ref 0–200)
HDL: 25 mg/dL — AB (ref >40)
LDL Cholesterol: 69 mg/dL (ref 0–99)
TRIGLYCERIDES: 123 mg/dL (ref <150)
VLDL: 25 mg/dL (ref 0–40)

## 2015-04-28 LAB — MRSA PCR SCREENING: MRSA by PCR: NEGATIVE

## 2015-04-28 NOTE — Progress Notes (Signed)
cbg 54. Hypoglycemic protocol followed. Carb snack and drink given. cbg recheck

## 2015-04-28 NOTE — Progress Notes (Signed)
SLP Cancellation Note  Patient Details Name: HADLEY HANDRICH MRN: [id number] DOB: 11-Jun-1933   Cancelled treatment:       Reason Eval/Treat Not Completed: Other (comment); Pt in with pastor and then MD and unable to see at this time. SLP will f/u tomorrow for SLE as needed. Pt passed RN swallow screen.  Thank you,  Genene Churn, [place]    [place] 04/28/2015, 3:17 PM

## 2015-04-28 NOTE — Progress Notes (Signed)
ATtempted to see patient morning of 3/1; patient in deep sleep and family reports that he has just gotten back from MRI and was given some medicine to help him calm down, requests that PT try back later. Did note that HR ranged from mid-40s to low 50s, indicating need for caution with activity.  Deniece Ree PT, DPT [phone]

## 2015-04-28 NOTE — Progress Notes (Signed)
Inpatient Diabetes Program Recommendations  AACE/ADA: New Consensus Statement on Inpatient Glycemic Control (2015)  Target Ranges:  Prepandial:   less than 140 mg/dL      Peak postprandial:   less than 180 mg/dL (1-2 hours)      Critically ill patients:  140 - 180 mg/dL   Results for [name], [name] (MRN [id number]) as of 04/28/2015 12:44  Ref. Range 04/28/2015 05:26 04/28/2015 06:04 04/28/2015 07:13 04/28/2015 08:14 04/28/2015 09:19  Glucose-Capillary Latest Ref Range: 65-99 mg/dL 54 (L) 80 90  67  3/1 Recommend discontinuing oral DM medications and changing correction to sensitive scale. [place], CDE. M.Ed. Pager [phone] Inpatient Diabetes Coordinator

## 2015-04-28 NOTE — Care Management Note (Signed)
Case Management Note  Patient Details  Name: [name] MRN: [id number] Date of Birth: [birth year]  Subjective/Objective:                  Pt admitted with TIA. Pt is from home, lives with wife and is ind with ADL's at baseline. Pt uses cane for ambulation at baseline and has walker if needed. Pt has CPAP at home but no home O2 or neb. Pt is observation and 3 day inpt stay req Requirement for SNF not anticipated to be met. Pt has not had PT eval a this time.   Action/Plan: Will cont to follow for DC planning, will need PT eval to determine functional status and potential needs.   Expected Discharge Date:  05/01/15               Expected Discharge Plan:  [place]  In-House Referral:  NA  Discharge planning Services  CM Consult  Post Acute Care Choice:    Choice offered to:     DME Arranged:    DME Agency:     HH Arranged:    HH Agency:     Status of Service:  In process, will continue to follow  Medicare Important Message Given:    Date Medicare IM Given:    Medicare IM give by:    Date Additional Medicare IM Given:    Additional Medicare Important Message give by:     If discussed at [place] of Stay Meetings, dates discussed:    Additional Comments:  Sherald Barge, RN 04/28/2015, 3:39 PM

## 2015-04-28 NOTE — Progress Notes (Signed)
OT Cancellation Note  Patient Details Name: ARKEL CANCELLIERE MRN: [id number] DOB: April 27, 1933   Cancelled Treatment:     Reason evaluation not completed: Chart reviewed, spoke with family.  Patient in deep sleep on OT/PT arrival and family reports that he has just gotten back from MRI prior to which he was provided with anxiety medication. Family requests that OT try back later. While in room, HR ranged from mid-40s to low 50s.  Guadelupe Sabin, OTR/L  [phone]  04/28/2015, 10:53 AM
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
## 2015-04-28 NOTE — Progress Notes (Signed)
PT Cancellation Note  Patient Details Name: [name] MRN: [id number] DOB: Nov 07, 1933   Cancelled Treatment:    Patient not seen. Stopped by patient's ICU room in late morning to check on patient status; while HR appears to have improved, patient appears to be still a bit lethargic and appeared to be having some difficulty following simple instructions from tech performing testing on him. Spoke to family, who reported that patient does not seem to have changed much from this morning in terms of alertness or energy levels, and actually stated that they would prefer if PT were pushed back as of now, stated they they feel he may do a little better tomorrow. Will attempt eval later as appropriate/able.    Deniece Ree PT, DPT [phone]

## 2015-04-28 NOTE — Progress Notes (Signed)
Subjective: He's here because the symptoms of a TIA. He is better now. He has just returned from an MRI and I don't have the results of that yet. His heart rate has been low. He's apparently had at least one other similar episode according to his wife. This lasted about an hour and a half. He is known to have severe vascular disease diffusely. He seems to be intact neurologically now.   Objective: Vital signs in last 24 hours: Temp:  [96.9 F (36.1 C)-99.1 F (37.3 C)] 97.2 F (36.2 C) (03/01 0824) Pulse Rate:  [41-54] 54 (02/28 2300) Resp:  [10-27] 14 (03/01 0535) BP: (105-194)/(45-121) 105/47 mmHg (03/01 0535) SpO2:  [95 %-100 %] 99 % (02/28 2300) Weight:  [117.482 kg (259 lb)-119.296 kg (263 lb)] 117.482 kg (259 lb) (03/01 0733) Weight change:  Last BM Date: 04/26/15  Intake/Output from previous day: 02/28 0701 - 03/01 0700 In: 240 [P.O.:240] Out: 350 [Urine:350]  PHYSICAL EXAM He is awake and alert. His heart rate is low and he has some slow heart rate and seems to skip some. By monitor it looks like he may have some sort of heart block. His chest is relatively clear. He is sleepy from having sedation for MRI. No focal neurologic findings now  Lab Results:  Results for orders placed or performed during the hospital encounter of 04/25/2015 (from the past 48 hour(s))  Protime-INR     Status: None   Collection Time: 04/22/2015  5:41 PM  Result Value Ref Range   Prothrombin Time 14.7 11.6 - 15.2 seconds   INR 1.13 0.00 - 1.49  APTT     Status: None   Collection Time: 04/03/2015  5:41 PM  Result Value Ref Range   aPTT 32 24 - 37 seconds  CBC     Status: Abnormal   Collection Time: 04/03/2015  5:41 PM  Result Value Ref Range   WBC 13.3 (H) 4.0 - 10.5 K/uL   RBC 4.15 (L) 4.22 - 5.81 MIL/uL   Hemoglobin 13.0 13.0 - 17.0 g/dL   HCT 40.0 39.0 - 52.0 %   MCV 96.4 78.0 - 100.0 fL   MCH 31.3 26.0 - 34.0 pg   MCHC 32.5 30.0 - 36.0 g/dL   RDW 15.2 11.5 - 15.5 %   Platelets 273 150 - 400  K/uL  Differential     Status: Abnormal   Collection Time: 04/15/2015  5:41 PM  Result Value Ref Range   Neutrophils Relative % 73 %   Neutro Abs 9.7 (H) 1.7 - 7.7 K/uL   Lymphocytes Relative 16 %   Lymphs Abs 2.2 0.7 - 4.0 K/uL   Monocytes Relative 9 %   Monocytes Absolute 1.2 (H) 0.1 - 1.0 K/uL   Eosinophils Relative 2 %   Eosinophils Absolute 0.3 0.0 - 0.7 K/uL   Basophils Relative 0 %   Basophils Absolute 0.0 0.0 - 0.1 K/uL  Comprehensive metabolic panel     Status: Abnormal   Collection Time: 04/09/2015  5:41 PM  Result Value Ref Range   Sodium 135 135 - 145 mmol/L   Potassium 4.7 3.5 - 5.1 mmol/L   Chloride 104 101 - 111 mmol/L   CO2 26 22 - 32 mmol/L   Glucose, Bld 227 (H) 65 - 99 mg/dL   BUN 27 (H) 6 - 20 mg/dL   Creatinine, Ser 1.93 (H) 0.61 - 1.24 mg/dL   Calcium 9.0 8.9 - 10.3 mg/dL   Total Protein 7.2 6.5 - 8.1  g/dL   Albumin 3.5 3.5 - 5.0 g/dL   AST 20 15 - 41 U/L   ALT 18 17 - 63 U/L   Alkaline Phosphatase 109 38 - 126 U/L   Total Bilirubin 0.8 0.3 - 1.2 mg/dL   GFR calc non Af Amer 31 (L) >60 mL/min   GFR calc Af Amer 36 (L) >60 mL/min    Comment: (NOTE) The eGFR has been calculated using the CKD EPI equation. This calculation has not been validated in all clinical situations. eGFR's persistently <60 mL/min signify possible Chronic Kidney Disease.    Anion gap 5 5 - 15  CBG monitoring, ED     Status: Abnormal   Collection Time: 04/10/2015  5:52 PM  Result Value Ref Range   Glucose-Capillary 223 (H) 65 - 99 mg/dL  I-stat troponin, ED (not at [facility], [facility])     Status: None   Collection Time: 04/14/2015  5:57 PM  Result Value Ref Range   Troponin i, poc 0.03 0.00 - 0.08 ng/mL   Comment 3            Comment: Due to the release kinetics of cTnI, a negative result within the first hours of the onset of symptoms does not rule out myocardial infarction with certainty. If myocardial infarction is still suspected, repeat the test at appropriate intervals.   I-Stat  Chem 8, ED  (not at [facility], [facility])     Status: Abnormal   Collection Time: 04/26/2015  6:00 PM  Result Value Ref Range   Sodium 138 135 - 145 mmol/L   Potassium 6.1 (HH) 3.5 - 5.1 mmol/L   Chloride 103 101 - 111 mmol/L   BUN 38 (H) 6 - 20 mg/dL   Creatinine, Ser 1.90 (H) 0.61 - 1.24 mg/dL   Glucose, Bld 219 (H) 65 - 99 mg/dL   Calcium, Ion 1.18 1.13 - 1.30 mmol/L   TCO2 27 0 - 100 mmol/L   Hemoglobin 14.3 13.0 - 17.0 g/dL   HCT 42.0 39.0 - 52.0 %  Glucose, capillary     Status: Abnormal   Collection Time: 04/28/15 12:04 AM  Result Value Ref Range   Glucose-Capillary 109 (H) 65 - 99 mg/dL   Comment 1 Notify RN   Lipid panel     Status: Abnormal   Collection Time: 04/28/15  5:18 AM  Result Value Ref Range   Cholesterol 119 0 - 200 mg/dL   Triglycerides 123 <150 mg/dL   HDL 25 (L) >40 mg/dL   Total CHOL/HDL Ratio 4.8 RATIO   VLDL 25 0 - 40 mg/dL   LDL Cholesterol 69 0 - 99 mg/dL    Comment:        Total Cholesterol/HDL:CHD Risk Coronary Heart Disease Risk Table                     Men   Women  1/2 Average Risk   3.4   3.3  Average Risk       5.0   4.4  2 X Average Risk   9.6   7.1  3 X Average Risk  23.4   11.0        Use the calculated Patient Ratio above and the CHD Risk Table to determine the patient's CHD Risk.        ATP III CLASSIFICATION (LDL):  <100     mg/dL   Optimal  100-129  mg/dL   Near or Above  Optimal  130-159  mg/dL   Borderline  160-189  mg/dL   High  >190     mg/dL   Very High   Basic metabolic panel     Status: Abnormal   Collection Time: 04/28/15  5:18 AM  Result Value Ref Range   Sodium 140 135 - 145 mmol/L   Potassium 4.2 3.5 - 5.1 mmol/L    Comment: DELTA CHECK NOTED   Chloride 107 101 - 111 mmol/L   CO2 24 22 - 32 mmol/L   Glucose, Bld 53 (L) 65 - 99 mg/dL   BUN 25 (H) 6 - 20 mg/dL   Creatinine, Ser 1.72 (H) 0.61 - 1.24 mg/dL   Calcium 8.9 8.9 - 10.3 mg/dL   GFR calc non Af Amer 36 (L) >60 mL/min   GFR calc Af Amer 42 (L)  >60 mL/min    Comment: (NOTE) The eGFR has been calculated using the CKD EPI equation. This calculation has not been validated in all clinical situations. eGFR's persistently <60 mL/min signify possible Chronic Kidney Disease.    Anion gap 9 5 - 15  Glucose, capillary     Status: Abnormal   Collection Time: 04/28/15  5:26 AM  Result Value Ref Range   Glucose-Capillary 54 (L) 65 - 99 mg/dL  Glucose, capillary     Status: None   Collection Time: 04/28/15  6:04 AM  Result Value Ref Range   Glucose-Capillary 80 65 - 99 mg/dL   Comment 1 Notify RN   Glucose, capillary     Status: None   Collection Time: 04/28/15  7:13 AM  Result Value Ref Range   Glucose-Capillary 90 65 - 99 mg/dL  Glucose, capillary     Status: None   Collection Time: 04/28/15  9:19 AM  Result Value Ref Range   Glucose-Capillary 67 65 - 99 mg/dL    ABGS  Recent Labs  04/26/2015 1800  TCO2 27   CULTURES Recent Results (from the past 240 hour(s))  Microscopic Examination     Status: Abnormal   Collection Time: 04/26/15  9:28 AM  Result Value Ref Range Status   WBC, UA >30 (A) 0 -  5 /hpf Final   RBC, UA 0-2 0 -  2 /hpf Final   Epithelial Cells (non renal) 0-10 0 - 10 /hpf Final   Casts Present (A) None seen /lpf Final   Cast Type Hyaline casts N/A Final   Mucus, UA Present Not Estab. Final   Bacteria, UA Few None seen/Few Final   Studies/Results: Ct Head Wo Contrast  04/23/2015  CLINICAL DATA:  Code stroke.  Confusion.  Right-sided droop. EXAM: CT HEAD WITHOUT CONTRAST TECHNIQUE: Contiguous axial images were obtained from the base of the skull through the vertex without intravenous contrast. COMPARISON:  06/16/2013 FINDINGS: There is mild diffuse low-attenuation within the subcortical and periventricular white matter compatible with chronic microvascular disease. Encephalomalacia within the left frontal lobe is again noted compatible with previous infarct. Right posterior parietal encephalomalacia is also  again noted. Progressive encephalomalacia involving the right frontal lobe is noted. Chronic infarcts involving bilateral cerebellar hemispheres are identified, image 12 of series 2. No evidence for acute intracranial hemorrhage, mass or acute cortical infarct. The paranasal sinuses are clear. The mastoid air cells are clear. The calvarium is intact. IMPRESSION: 1. No acute intracranial abnormalities. 2. Advanced small vessel ischemic disease 3. Chronic bilateral cerebral and cerebellar infarcts. Electronically Signed   By: Kerby Moors M.D.   On: 04/16/2015 17:55  Mr [name] Head Wo Contrast  04/28/2015  CLINICAL DATA:  Slurred speech and right-sided weakness. Right facial droop. Symptoms began yesterday. EXAM: MRI HEAD WITHOUT CONTRAST MRA HEAD WITHOUT CONTRAST TECHNIQUE: Multiplanar, multiecho pulse sequences of the brain and surrounding structures were obtained without intravenous contrast. Angiographic images of the head were obtained using MRA technique without contrast. COMPARISON:  CT 04/04/2015.  MRI 06/16/2013. FINDINGS: MRI HEAD FINDINGS Diffusion imaging does not show any acute or subacute infarction. There chronic small-vessel ischemic changes throughout the pons. There are old bilateral small vessel cerebellar infarctions cerebral hemispheres show old cortical and subcortical infarctions in the right temporoparietal region, the right parietal region and both frontal regions. There is confluent chronic small vessel ischemic change affecting the white matter there is old left thalamic lacunar infarction. No evidence of neoplastic mass lesion, acute hemorrhage, hydrocephalus or extra-axial collection. There are a few foci of hemosiderin deposition related to old infarctions. No pituitary mass. No inflammatory sinus disease. No skull or skullbase lesion. MRA HEAD FINDINGS Both internal carotid arteries are widely patent through the skullbase. No siphon stenosis. The anterior and middle cerebral vessels  are patent without proximal stenosis, aneurysm or vascular malformation. There are areas of narrowing in the more distal MCA branch vessels, more notable on the right than the left. The left vertebral artery is a large vessel widely patent to the basilar. No antegrade flow in the right vertebral artery at the foramen magnum. Mild retrograde flow in the distal right vertebral artery. No basilar stenosis. Superior cerebellar and posterior cerebral arteries show flow bilaterally, with distal narrowing and irregularity in the PCA branches. IMPRESSION: No acute infarction. Chronic small-vessel ischemic change throughout the brain. Old cortical infarctions affecting both hemispheres. No antegrade flow in the right vertebral artery. Medium to small vessel intracranial atherosclerosis diffusely, most evident in the MCA and PCA branches. Electronically Signed   By: Nelson Chimes M.D.   On: 04/28/2015 08:30   Mr Brain Wo Contrast  04/28/2015  CLINICAL DATA:  Slurred speech and right-sided weakness. Right facial droop. Symptoms began yesterday. EXAM: MRI HEAD WITHOUT CONTRAST MRA HEAD WITHOUT CONTRAST TECHNIQUE: Multiplanar, multiecho pulse sequences of the brain and surrounding structures were obtained without intravenous contrast. Angiographic images of the head were obtained using MRA technique without contrast. COMPARISON:  CT 04/23/2015.  MRI 06/16/2013. FINDINGS: MRI HEAD FINDINGS Diffusion imaging does not show any acute or subacute infarction. There chronic small-vessel ischemic changes throughout the pons. There are old bilateral small vessel cerebellar infarctions cerebral hemispheres show old cortical and subcortical infarctions in the right temporoparietal region, the right parietal region and both frontal regions. There is confluent chronic small vessel ischemic change affecting the white matter there is old left thalamic lacunar infarction. No evidence of neoplastic mass lesion, acute hemorrhage, hydrocephalus  or extra-axial collection. There are a few foci of hemosiderin deposition related to old infarctions. No pituitary mass. No inflammatory sinus disease. No skull or skullbase lesion. MRA HEAD FINDINGS Both internal carotid arteries are widely patent through the skullbase. No siphon stenosis. The anterior and middle cerebral vessels are patent without proximal stenosis, aneurysm or vascular malformation. There are areas of narrowing in the more distal MCA branch vessels, more notable on the right than the left. The left vertebral artery is a large vessel widely patent to the basilar. No antegrade flow in the right vertebral artery at the foramen magnum. Mild retrograde flow in the distal right vertebral artery. No basilar stenosis. Superior cerebellar and posterior cerebral  arteries show flow bilaterally, with distal narrowing and irregularity in the PCA branches. IMPRESSION: No acute infarction. Chronic small-vessel ischemic change throughout the brain. Old cortical infarctions affecting both hemispheres. No antegrade flow in the right vertebral artery. Medium to small vessel intracranial atherosclerosis diffusely, most evident in the MCA and PCA branches. Electronically Signed   By: Nelson Chimes M.D.   On: 04/28/2015 08:30    Medications: I reviewed his current medications  Assesment:it appears that he had a TIA. He is doing better. He is known to have severe vascular disease. His heart rate is low so we're going to need to have cardiology take a look at him. I have also requested neurology consultation. I will review the MRI when it's available. Principal Problem:   TIA (transient ischemic attack) Active Problems:   Chronic diastolic heart failure (HCC)   DM (diabetes mellitus) ([place])   Hypothyroidism   HTN (hypertension)   CAD, CABG '98, cath '08, low risk Myoview Feb 2012, stable cath 12/19/12 with chronically occluded VG to LCX, patent LIMA-LAD & VG to dominant rt.    Obesity   Depression   Sleep  apnea, on C-pap   PVD, Lt Vertebral artery PTA in '08, known moderate ICA disease   CKD (chronic kidney disease) stage 3, GFR 30-59 ml/min   Hyperlipidemia   Anxiety    Plan:neurology and cardiology consultation will be obtained. He will continue everything else. I will check the MRI when the report is available.      Yena Tisby L 04/28/2015, 10:07 AM

## 2015-04-28 NOTE — Care Management Obs Status (Signed)
[place] NOTIFICATION   Patient Details  Name: [name] MRN: [id number] Date of Birth: 03/04/1933   Medicare Observation Status Notification Given:  Yes    Sherald Barge, RN 04/28/2015, 3:39 PM

## 2015-04-28 DEATH — deceased

## 2015-04-29 DIAGNOSIS — R001 Bradycardia, unspecified: Secondary | ICD-10-CM | POA: Diagnosis not present

## 2015-04-29 DIAGNOSIS — I25709 Atherosclerosis of coronary artery bypass graft(s), unspecified, with unspecified angina pectoris: Secondary | ICD-10-CM | POA: Diagnosis not present

## 2015-04-29 DIAGNOSIS — G459 Transient cerebral ischemic attack, unspecified: Secondary | ICD-10-CM | POA: Diagnosis not present

## 2015-04-29 DIAGNOSIS — N183 Chronic kidney disease, stage 3 (moderate): Secondary | ICD-10-CM | POA: Diagnosis not present

## 2015-04-29 LAB — GLUCOSE, CAPILLARY
GLUCOSE-CAPILLARY: 102 mg/dL — AB (ref 65–99)
GLUCOSE-CAPILLARY: 120 mg/dL — AB (ref 65–99)
GLUCOSE-CAPILLARY: 155 mg/dL — AB (ref 65–99)
GLUCOSE-CAPILLARY: 163 mg/dL — AB (ref 65–99)
GLUCOSE-CAPILLARY: 75 mg/dL (ref 65–99)
Glucose-Capillary: 102 mg/dL — ABNORMAL HIGH (ref 65–99)
Glucose-Capillary: 132 mg/dL — ABNORMAL HIGH (ref 65–99)
Glucose-Capillary: 158 mg/dL — ABNORMAL HIGH (ref 65–99)
Glucose-Capillary: 172 mg/dL — ABNORMAL HIGH (ref 65–99)
Glucose-Capillary: 184 mg/dL — ABNORMAL HIGH (ref 65–99)

## 2015-04-29 LAB — HEMOGLOBIN A1C
HEMOGLOBIN A1C: 6.8 % — AB (ref 4.8–5.6)
Hgb A1c MFr Bld: 6.7 % — ABNORMAL HIGH (ref 4.8–5.6)
Mean Plasma Glucose: 146 mg/dL
Mean Plasma Glucose: 148 mg/dL

## 2015-04-29 NOTE — Procedures (Signed)
[place] A. Merlene Laughter, MD     [email or web address]           HISTORY: The patient is a 82 year old who presents with recurrent spells of confusion and altered mental status. Patient has had multiple strokes in the past. The EEG is being done to evaluate for seizures as a cause of these confusions.  MEDICATIONS: Scheduled Meds: . acarbose  100 mg Oral TID WC  . allopurinol  150 mg Oral QHS  . amLODipine  10 mg Oral Daily  . aspirin EC  81 mg Oral q morning - 10a  . atorvastatin  40 mg Oral q1800  . clopidogrel  75 mg Oral Daily  . furosemide  40 mg Oral BID  . glipiZIDE  10 mg Oral QAC breakfast  . heparin  5,000 Units Subcutaneous 3 times per day  . insulin aspart  0-20 Units Subcutaneous 6 times per day  . isosorbide dinitrate  20 mg Oral TID  . levothyroxine  150 mcg Oral QAC breakfast  . linagliptin  5 mg Oral Daily  . pantoprazole  40 mg Oral Daily  . tiotropium  18 mcg Inhalation q morning - 10a   Continuous Infusions:  PRN Meds:.gabapentin, LORazepam, senna-docusate  Prior to Admission medications   Medication Sig Start Date End Date Taking? Authorizing Provider  allopurinol (ZYLOPRIM) 300 MG tablet Take 150 mg by mouth at bedtime.    Yes Historical Provider, MD  aspirin EC 81 MG tablet Take 81 mg by mouth every morning.    Yes Historical Provider, MD  atorvastatin (LIPITOR) 40 MG tablet TAKE 1 TABLET BY MOUTH DAILY. 02/04/15  Yes Lorretta Harp, MD  Cholecalciferol (VITAMIN D3) 1000 UNITS CAPS Take 1 capsule by mouth 2 (two) times daily.    Yes Historical Provider, MD  ciprofloxacin-fluocinolone PF (OTOVEL) 0.3-0.025 % SOLN Place 0.25 mLs in ear(s) 2 (two) times daily as needed (for drainage).   Yes Historical Provider, MD  clopidogrel (PLAVIX) 75 MG tablet TAKE ONE TABLET BY MOUTH ONCE DAILY. 11/19/14  Yes Lorretta Harp, MD  furosemide (LASIX) 40 MG tablet Take 1 tablet by mouth 2 (two) times daily. 03/13/14  Yes Historical Provider, MD    gabapentin (NEURONTIN) 300 MG capsule Take 300 mg by mouth 4 (four) times daily as needed (takes three times daily but may take one in between if needed).   Yes Historical Provider, MD  glipiZIDE (GLUCOTROL) 10 MG tablet Take 10 mg by mouth daily.    Yes Historical Provider, MD  HYDROcodone-acetaminophen (NORCO) 10-325 MG per tablet Take 1 tablet by mouth 4 (four) times daily as needed for pain.   Yes Historical Provider, MD  insulin glargine (LANTUS) 100 UNIT/ML injection Inject 60 Units into the skin 2 (two) times daily.    Yes Historical Provider, MD  isosorbide dinitrate (ISORDIL) 20 MG tablet Take 1 tablet (20 mg total) by mouth 3 (three) times daily. 03/08/13  Yes Lorretta Harp, MD  levothyroxine (SYNTHROID, LEVOTHROID) 150 MCG tablet Take 150 mcg by mouth daily before breakfast.   Yes Historical Provider, MD  mometasone (ELOCON) 0.1 % ointment Apply 1 application topically as directed.  03/13/13  Yes Historical Provider, MD  nitroGLYCERIN (NITROSTAT) 0.4 MG SL tablet Place 1 tablet (0.4 mg total) under the tongue every 5 (five) minutes as needed for chest pain. 12/09/12  Yes [name], PA-C  omeprazole (PRILOSEC) 20 MG capsule Take 20 mg by mouth at bedtime.    Yes Historical Provider, MD  [name] (JANUVIA) 100 MG tablet Take 50 mg by mouth 2 (two) times daily.    Yes Historical Provider, MD  Tiotropium Bromide Monohydrate (SPIRIVA HANDIHALER IN) Inhale 1 capsule into the lungs every morning.    Yes Historical Provider, MD  triamcinolone cream (KENALOG) 0.1 % Apply 1 application topically daily as needed (for irritation).  09/25/12  Yes Historical Provider, MD      ANALYSIS: A 16 channel recording using standard 10 20 measurements is conducted for 23 minutes. The background activity gets as high as 6-7 Hz bilaterally. Beta activity is observed in the frontal areas. Photic stimulation and hyperventilation were not carried out. There is awake and drowsy activities observed. The  patient's is noted to have occasional episodes of slowing involving the right frontocentral region. This is also associated with sharp wave activity that phase reverses in the central region   IMPRESSION: This recording is abnormal for the final results: 1. Right frontocentral slowing and sharp wave activity. This can be correlated clinically with focal onset seizures and/or focal cerebral disturbance. 2. Moderate global slowing indicating a moderate global encephalopathy.      Rosemae Mcquown A. Merlene Laughter, M.D.  Diplomate, Tax adviser of Psychiatry and Neurology ( Neurology).

## 2015-04-29 NOTE — Plan of Care (Signed)
Problem: Acute Rehab PT Goals(only PT should resolve) Goal: Pt Will Transfer Bed To Chair/Chair To Bed Pt will transfer sit to/from-stand with LRAD at [place] without loss-of-balance to demonstrate good safety awareness for independent mobility in home.     Goal: Pt Will Ambulate Pt will ambulate with RW at [place] for a distance greater than 235ft with VSS to demonstrate the ability to perform safe household distance ambulation at discharge.

## 2015-04-29 NOTE — Evaluation (Signed)
Physical Therapy Evaluation Patient Details Name: [name] MRN: [id number] DOB: Jul 30, 1933 Today's Date: 04/29/2015   History of Present Illness  Charming "Theadore Nan" Turenne is a 82yo white male who presents to [facility] on 2/28 after sudden onset of R facial droop, dysarthria, and R hemiplegia, all of which resolved by time of arrival. [name] is familiar to this [name] from previous treatments sessions in outpatient setting in 2016.  PMH: CAD s/p CABG, ICAD, DM, hypothyroidism, OAS c CPAP, diastolic CHF. This visit, head CT reveals remote infarcts previously unaccounted for, and MRI is negative for acute infarct.   Clinical Impression  At evaluation, [name] is received semirecumbent in bed upon entry, family/caregiver present. [name] is awake and agreeable to participate. [name] patient immediately recognizes [name] from prior treatments and is sociable. No acute distress noted at this time, [name] reports that he still does not quite feel to be himself. [name] is alert and oriented x3, pleasant, conversational, and following simple and multi-step commands consistently. No note LOB during session. [name] grip strength is moderately weak and symmetrical; global strength as screened during functional mobility assessment presents with moderate impairment, [name] now requiring physical assistance for transfers and demonstrating decreased tolerance and velocity in gait. [name] estimates to be 20-30% weaker than his baseline. During Ambulation, trial is ended after sudden desaturation into high 70's%, initially believed to be erroneous [name] immediately followed by sudden tachycardia into [name] 120's-140's with monitor displaying PVC into [name] 30's-40's. [name] recovers quickly to VSS after seated for 10 seconds, denies angina, dizziness, SOB, however [name] concurrently felt he could walk no further at that time.   Patient presenting with impairment of strength, balance, cardio pulmonary status, and activity tolerance, limiting ability to perform ADL and  household mobility at baseline level of function. Patient will benefit from skilled intervention to address [name] above impairments and limitations, in order to restore to prior level of function, improve patient safety upon discharge, and to decrease falls risk.       Follow Up Recommendations SNF    Equipment Recommendations  None recommended by [name]    Recommendations for Other Services       Precautions / Restrictions Precautions Precautions: Fall Restrictions Weight Bearing Restrictions: No      Mobility  Bed Mobility Overal bed mobility: Needs Assistance Bed Mobility: Supine to Sit     Supine to sit: Modified independent (Device/Increase time)     General bed mobility comments: Perfoms with guard rail and single HHA.   Transfers Overall transfer level: Needs assistance Equipment used: Rolling walker (2 wheeled) Transfers: Sit to/from Stand Sit to Stand: Min assist         General transfer comment: attempts multiple times c difficulty, given minA to perform.   Ambulation/Gait Ambulation/Gait assistance: Min guard Ambulation Distance (Feet): 32 Feet Assistive device: Rolling walker (2 wheeled) Gait Pattern/deviations: Trunk flexed   Gait velocity interpretation: <1.8 ft/sec, indicative of risk for recurrent falls General Gait Details: slow, cautious, flexed trunk, appears stable with ModI RW; alternated retro ambulation without LOB. HR and SaO2 become concerning after 2-3 minutes and ambulation is ended.   Stairs            Wheelchair Mobility    Modified Rankin (Stroke Patients Only)       Balance Overall balance assessment: Modified Independent;History of Falls  Standardized Balance Assessment Standardized Balance Assessment :  (Not appropriate at this time. )           Pertinent Vitals/Pain Pain Assessment: No/denies pain    Home Living Family/patient expects to be discharged to:: Private  residence Living Arrangements: Spouse/significant other Available Help at Discharge: Family Type of Home: House Home Access: Stairs to enter Entrance Stairs-Rails: Right Entrance Stairs-Number of Steps: 2 Home Layout: One level Home Equipment: Walker - 2 wheels;Cane - single point;Walker - 4 wheels Additional Comments: regular use of rollator within house for stability.     Prior Function     Gait / Transfers Assistance Needed: Household distances only (200-377ft) at baseline, limited by chronic cardiac status.   ADL's / Homemaking Assistance Needed: [name] wife assists with donning socks and shoes  Comments: Wife reports chronic LBP; daughter assists withmeals/ groceries.      Hand Dominance   Dominant Hand: Right    Extremity/Trunk Assessment   Upper Extremity Assessment: Defer to OT evaluation           Lower Extremity Assessment: Generalized weakness;Overall WFL for tasks assessed (minA to perform sit to stand from EOB. )         Communication   Communication: HOH  Cognition Arousal/Alertness: Awake/alert Behavior During Therapy: WFL for tasks assessed/performed Overall Cognitive Status: Impaired/Different from baseline (mild difficulty focusing. )                      General Comments      Exercises        Assessment/Plan    [name] Assessment Patient needs continued [name] services  [name] Diagnosis Difficulty walking;Generalized weakness;Abnormality of gait   [name] Problem List Decreased mobility;Decreased strength;Decreased range of motion;Decreased activity tolerance;Cardiopulmonary status limiting activity;Obesity  [name] Treatment Interventions Functional mobility training;Therapeutic activities;Patient/family education;Therapeutic exercise   [name] Goals (Current goals can be found in [name] Care Plan section) Acute Rehab [name] Goals Patient Stated Goal: Regain strength and independence [name] Goal Formulation: With patient/family Time For Goal Achievement:  05/13/15 Potential to Achieve Goals: Fair    Frequency Min 3X/week   Barriers to discharge Inaccessible home environment;Decreased caregiver support      Co-evaluation               End of Session Equipment Utilized During Treatment: Gait belt Activity Tolerance: Patient limited by fatigue;Treatment limited secondary to medical complications (Comment) Patient left: in chair;with call bell/phone within reach;with family/visitor present Nurse Communication: Mobility status    Functional Assessment Tool Used: Clinical Judgement  Functional Limitation: Mobility: Walking and moving around Mobility: Walking and Moving Around Current Status [id number]): At least 60 percent [name] less than 80 percent impaired, limited or restricted Mobility: Walking and Moving Around Goal Status [phone]): At least 40 percent [name] less than 60 percent impaired, limited or restricted    Time: 0932-0959 [name] Time Calculation (min) (ACUTE ONLY): 27 min   Charges:   [name] Evaluation $[name] Eval Moderate Complexity: 1 Procedure [name] Treatments $Therapeutic Activity: 8-22 mins   [name] G Codes:   [name] G-Codes **NOT FOR INPATIENT CLASS** Functional Assessment Tool Used: Clinical Judgement  Functional Limitation: Mobility: Walking and moving around Mobility: Walking and Moving Around Current Status [id number]): At least 60 percent [name] less than 80 percent impaired, limited or restricted Mobility: Walking and Moving Around Goal Status [phone]): At least 40 percent [name] less than 60 percent impaired, limited or restricted    2:52 PM, 04/29/2015 Etta Grandchild, [name], DPT PRN  Physical Therapist at [facility] # [id number] (wireless)  [phone] (mobile)

## 2015-04-29 NOTE — Consult Note (Signed)
[name], MD     [email or web address]          [name] is an 82 y.o. male.   ASSESSMENT/PLAN:  1. Transient right-sided weakness and dysarthria. I suspect this is most likely a TIA. The patient urinalysis suggests UTI which can cause recurrence of old stroke symptoms. There is also possibility that he may have a post-lesional's seizure although this is not clear at this time. 2. Multiple bilateral cortical infarcts highly suspicious for cardioembolic phenomenon. 3. High risk for vascular dementia. 4. Moderate intracranial occlusive disease unlikely to explain the patient's strokes. 5. Coronary artery disease. 6. R Extracranial carotid stenosis- moderate.   RECOMMENDATION: 30 day event monitor to evaluate for paroxysmal atrial fibrillation. Continue with current dual antiplatelet agents. Agree with statin use. Diabetes control.   The patient is a 82 year old white male who presents with right-sided weakness and dysarthria. There appears to be slight right facial weakness. It appears the patient has had similar episode like this. The patient has been that he has had 4 strokes in the past. He is on dual antiplatelet agent aspirin and Plavix and also taking Trental. Patient tells me that he was diagnosed as having a urine tract infection. Indeed outpatient urinalysis shows WBC greater than 30 with epithelial cells 0-10. It's unclear if he was treated. The culture is pending. The patient reports that he's feeling well this time. The review systems is negative.  GENERAL: Pleasant obese man in no acute distress.  HEENT: Supple. Atraumatic normocephalic. Large stocky neck.  ABDOMEN: soft  EXTREMITIES: No edema   BACK: Normal.  SKIN: There is mild psoriatic changes involving the knees, ankles and upper extremities.     MENTAL STATUS: He is awake and alert. He does have significant hearing impairment. He seems a little unsure as to why he is in the  hospital. He is oriented to hospital and the city. He is not oriented to time.  CRANIAL NERVES: Pupils are equal, round and reactive to light; extra ocular movements are full, there is no significant nystagmus; visual fields are full; upper and lower facial muscles are normal in strength and symmetric, there is no flattening of the nasolabial folds; tongue is midline; uvula is midline; shoulder elevation is normal.  MOTOR: Normal tone, bulk and strength; no pronator drift.  COORDINATION: Left finger to nose is normal, right finger to nose is normal, No rest tremor; no intention tremor; no postural tremor; no bradykinesia.  REFLEXES: Deep tendon reflexes are symmetrical and normal. Babinski reflexes are flexor bilaterally.   SENSATION: Normal to light touch.            Blood pressure 119/61, pulse 63, temperature 97.1 F (36.2 C), temperature source Oral, resp. rate 19, height 5' 11" (1.803 m), weight 117.6 kg (259 lb 4.2 oz), SpO2 81 %.  Past Medical History  Diagnosis Date  . Diabetes mellitus type II   . Hypothyroidism   . Essential hypertension   . GERD (gastroesophageal reflux disease)   . Chronic diastolic heart failure ([place])   . Heart attack ([place])   . COPD (chronic obstructive pulmonary disease) ([place])   . Coronary artery disease     CABG 1998, cath 2014 - patent LIMA-LAD, occluded SVG-CFX, patent SVG-RCA  . Obesity   . PVD (peripheral vascular disease) ([place])     Left vertebral stent 1998  . OSA on CPAP   . Hyperlipidemia   . Chronic bronchitis ([place])   . Arthritis   .  Gout   . Pneumonia 05/2011  . Bradycardia, to 30 12/20/2012  . Nephrolithiasis   . CKD (chronic kidney disease) stage 3, GFR 30-59 ml/min     Past Surgical History  Procedure Laterality Date  . Back surgery    . Replantation finger Right 2000    "little finger" (12/17/2012)  . Cholecystectomy    . Tonsillectomy  ?1960's  . Knee arthroscopy Left 1990's  . Lumbar laminectomy/decompression  microdiscectomy  04/11/2011    Procedure: LUMBAR LAMINECTOMY/DECOMPRESSION MICRODISCECTOMY 1 LEVEL;  Surgeon: Peggyann Shoals, MD;  Location: [facility];  Service: Neurosurgery;  Laterality: Right;  RIGHT Lumbar Three-Four laminectomy with resection of synovial cyst  . Angioplasty  '99, 00, March 06  . Tibia fracture surgery Right ~ 2012  . Coronary artery bypass graft  04/1996    "CABG X6" (12/17/2012)  . Cataract extraction w/ intraocular lens  implant, bilateral    . Lithotripsy      "once" (12/17/2012)  . Cardiac catheterization  April 2006; 12/19/2012  . Coronary angioplasty with stent placement      "I've had a total of 3-4 stents put in" (12/17/2012)  . Fracture surgery    . Colonoscopy  08/09/2004    OYD:XAJOINO diverticula.  Remainder of colonic mucosa appeared normal/normal rectum  . Vertiberal artery stent      1998, 99% blocked.  . Colonoscopy N/A 07/31/2013    MVE:HMCNOB colonic polyp-removed as described above. Status post segmental biopsy to evaluate for microscopic colitis. bx tubular adenoma, asc colon folcal active colitis/lyphoid aggregates (nonspecific)  . Left heart catheterization with coronary angiogram N/A 12/19/2012    Procedure: LEFT HEART CATHETERIZATION WITH CORONARY ANGIOGRAM;  Surgeon: Lorretta Harp, MD;  Location: [facility] CATH LAB;  Service: Cardiovascular;  Laterality: N/A;    Family History  Problem Relation Age of Onset  . Arthritis    . Cancer    . Diabetes    . Colon cancer Neg Hx     Social History:  reports that he quit smoking about 29 years ago. His smoking use included Cigarettes. He has a 140 pack-year smoking history. He has quit using smokeless tobacco. His smokeless tobacco use included Chew. He reports that he drinks alcohol. He reports that he does not use illicit drugs.  Allergies:  Allergies  Allergen Reactions  . Demerol Nausea And Vomiting  . Ranexa [Ranolazine] Nausea Only    dizziness    Medications: Prior to Admission  medications   Medication Sig Start Date End Date Taking? Authorizing Provider  allopurinol (ZYLOPRIM) 300 MG tablet Take 150 mg by mouth at bedtime.    Yes Historical Provider, MD  aspirin EC 81 MG tablet Take 81 mg by mouth every morning.    Yes Historical Provider, MD  atorvastatin (LIPITOR) 40 MG tablet TAKE 1 TABLET BY MOUTH DAILY. 02/04/15  Yes Lorretta Harp, MD  Cholecalciferol (VITAMIN D3) 1000 UNITS CAPS Take 1 capsule by mouth 2 (two) times daily.    Yes Historical Provider, MD  ciprofloxacin-fluocinolone PF (OTOVEL) 0.3-0.025 % SOLN Place 0.25 mLs in ear(s) 2 (two) times daily as needed (for drainage).   Yes Historical Provider, MD  clopidogrel (PLAVIX) 75 MG tablet TAKE ONE TABLET BY MOUTH ONCE DAILY. 11/19/14  Yes Lorretta Harp, MD  furosemide (LASIX) 40 MG tablet Take 1 tablet by mouth 2 (two) times daily. 03/13/14  Yes Historical Provider, MD  gabapentin (NEURONTIN) 300 MG capsule Take 300 mg by mouth 4 (four) times daily as  needed (takes three times daily but may take one in between if needed).   Yes Historical Provider, MD  glipiZIDE (GLUCOTROL) 10 MG tablet Take 10 mg by mouth daily.    Yes Historical Provider, MD  HYDROcodone-acetaminophen (NORCO) 10-325 MG per tablet Take 1 tablet by mouth 4 (four) times daily as needed for pain.   Yes Historical Provider, MD  insulin glargine (LANTUS) 100 UNIT/ML injection Inject 60 Units into the skin 2 (two) times daily.    Yes Historical Provider, MD  isosorbide dinitrate (ISORDIL) 20 MG tablet Take 1 tablet (20 mg total) by mouth 3 (three) times daily. 03/08/13  Yes Jonathan J Berry, MD  levothyroxine (SYNTHROID, LEVOTHROID) 150 MCG tablet Take 150 mcg by mouth daily before breakfast.   Yes Historical Provider, MD  mometasone (ELOCON) 0.1 % ointment Apply 1 application topically as directed.  03/13/13  Yes Historical Provider, MD  nitroGLYCERIN (NITROSTAT) 0.4 MG SL tablet Place 1 tablet (0.4 mg total) under the tongue every 5 (five) minutes  as needed for chest pain. 12/09/12  Yes [name], PA-C  omeprazole (PRILOSEC) 20 MG capsule Take 20 mg by mouth at bedtime.    Yes Historical Provider, MD  [name] (JANUVIA) 100 MG tablet Take 50 mg by mouth 2 (two) times daily.    Yes Historical Provider, MD  Tiotropium Bromide Monohydrate (SPIRIVA HANDIHALER IN) Inhale 1 capsule into the lungs every morning.    Yes Historical Provider, MD  triamcinolone cream (KENALOG) 0.1 % Apply 1 application topically daily as needed (for irritation).  09/25/12  Yes Historical Provider, MD    Scheduled Meds: . acarbose  100 mg Oral TID WC  . allopurinol  150 mg Oral QHS  . amLODipine  10 mg Oral Daily  . aspirin EC  81 mg Oral q morning - 10a  . atorvastatin  40 mg Oral q1800  . clopidogrel  75 mg Oral Daily  . furosemide  40 mg Oral BID  . glipiZIDE  10 mg Oral QAC breakfast  . heparin  5,000 Units Subcutaneous 3 times per day  . insulin aspart  0-20 Units Subcutaneous 6 times per day  . isosorbide dinitrate  20 mg Oral TID  . levothyroxine  150 mcg Oral QAC breakfast  . linagliptin  5 mg Oral Daily  . pantoprazole  40 mg Oral Daily  . tiotropium  18 mcg Inhalation q morning - 10a   Continuous Infusions:  PRN Meds:.gabapentin, LORazepam, senna-docusate     Results for orders placed or performed during the hospital encounter of 04/21/2015 (from the past 48 hour(s))  MRSA PCR Screening     Status: None   Collection Time: 04/14/2015  9:26 PM  Result Value Ref Range   MRSA by PCR NEGATIVE NEGATIVE    Comment:        The GeneXpert MRSA Assay (FDA approved for NASAL specimens only), is one component of a comprehensive MRSA colonization surveillance program. It is not intended to diagnose MRSA infection nor to guide or monitor treatment for MRSA infections.   Glucose, capillary     Status: Abnormal   Collection Time: 04/28/15 12:04 AM  Result Value Ref Range   Glucose-Capillary 109 (H) 65 - 99 mg/dL   Comment 1 Notify RN   Lipid  panel     Status: Abnormal   Collection Time: 04/28/15  5:18 AM  Result Value Ref Range   Cholesterol 119 0 - 200 mg/dL   Triglycerides 123 <150 mg/dL     HDL 25 (L) >40 mg/dL   Total CHOL/HDL Ratio 4.8 RATIO   VLDL 25 0 - 40 mg/dL   LDL Cholesterol 69 0 - 99 mg/dL    Comment:        Total Cholesterol/HDL:CHD Risk Coronary Heart Disease Risk Table                     Men   Women  1/2 Average Risk   3.4   3.3  Average Risk       5.0   4.4  2 X Average Risk   9.6   7.1  3 X Average Risk  23.4   11.0        Use the calculated Patient Ratio above and the CHD Risk Table to determine the patient's CHD Risk.        ATP III CLASSIFICATION (LDL):  <100     mg/dL   Optimal  100-129  mg/dL   Near or Above                    Optimal  130-159  mg/dL   Borderline  160-189  mg/dL   High  >190     mg/dL   Very High   Basic metabolic panel     Status: Abnormal   Collection Time: 04/28/15  5:18 AM  Result Value Ref Range   Sodium 140 135 - 145 mmol/L   Potassium 4.2 3.5 - 5.1 mmol/L    Comment: DELTA CHECK NOTED   Chloride 107 101 - 111 mmol/L   CO2 24 22 - 32 mmol/L   Glucose, Bld 53 (L) 65 - 99 mg/dL   BUN 25 (H) 6 - 20 mg/dL   Creatinine, Ser 1.72 (H) 0.61 - 1.24 mg/dL   Calcium 8.9 8.9 - 10.3 mg/dL   GFR calc non Af Amer 36 (L) >60 mL/min   GFR calc Af Amer 42 (L) >60 mL/min    Comment: (NOTE) The eGFR has been calculated using the CKD EPI equation. This calculation has not been validated in all clinical situations. eGFR's persistently <60 mL/min signify possible Chronic Kidney Disease.    Anion gap 9 5 - 15  Glucose, capillary     Status: Abnormal   Collection Time: 04/28/15  5:26 AM  Result Value Ref Range   Glucose-Capillary 54 (L) 65 - 99 mg/dL  Glucose, capillary     Status: None   Collection Time: 04/28/15  6:04 AM  Result Value Ref Range   Glucose-Capillary 80 65 - 99 mg/dL   Comment 1 Notify RN   Glucose, capillary     Status: None   Collection Time: 04/28/15   7:13 AM  Result Value Ref Range   Glucose-Capillary 90 65 - 99 mg/dL  Glucose, capillary     Status: None   Collection Time: 04/28/15  9:19 AM  Result Value Ref Range   Glucose-Capillary 67 65 - 99 mg/dL  Glucose, capillary     Status: Abnormal   Collection Time: 04/28/15 11:34 AM  Result Value Ref Range   Glucose-Capillary 102 (H) 65 - 99 mg/dL  Glucose, capillary     Status: None   Collection Time: 04/28/15  4:14 PM  Result Value Ref Range   Glucose-Capillary 75 65 - 99 mg/dL   Comment 1 Notify RN    Comment 2 Document in Chart   Glucose, capillary     Status: Abnormal   Collection Time: 04/28/15  8:26 PM    Result Value Ref Range   Glucose-Capillary 120 (H) 65 - 99 mg/dL  Glucose, capillary     Status: Abnormal   Collection Time: 04/29/15 12:16 AM  Result Value Ref Range   Glucose-Capillary 163 (H) 65 - 99 mg/dL  Glucose, capillary     Status: Abnormal   Collection Time: 04/29/15  4:29 AM  Result Value Ref Range   Glucose-Capillary 102 (H) 65 - 99 mg/dL  Glucose, capillary     Status: Abnormal   Collection Time: 04/29/15  5:33 AM  Result Value Ref Range   Glucose-Capillary 132 (H) 65 - 99 mg/dL  Glucose, capillary     Status: Abnormal   Collection Time: 04/29/15  7:24 AM  Result Value Ref Range   Glucose-Capillary 155 (H) 65 - 99 mg/dL   Comment 1 Notify RN    Comment 2 Document in Chart   Glucose, capillary     Status: Abnormal   Collection Time: 04/29/15 11:47 AM  Result Value Ref Range   Glucose-Capillary 158 (H) 65 - 99 mg/dL   Comment 1 Notify RN    Comment 2 Document in Chart   Glucose, capillary     Status: Abnormal   Collection Time: 04/29/15  4:35 PM  Result Value Ref Range   Glucose-Capillary 172 (H) 65 - 99 mg/dL   Comment 1 Notify RN    Comment 2 Document in Chart     Studies/Results:  CAROTID DOPPLERS RIGHT    ICA:   195/22 cm/sec    CCA:   150/7 cm/sec    SYSTOLIC ICA/CCA RATIO:   1.3    DIASTOLIC ICA/CCA RATIO:   3.0    ECA:   248  cm/sec    LEFT    ICA:   122/22 cm/sec    CCA:   68/8 cm/sec    SYSTOLIC ICA/CCA RATIO:   1.8    DIASTOLIC ICA/CCA RATIO:   2.8    ECA:   67 cm/sec         IMPRESSION: 1. Moderate bilateral carotid bifurcation calcified plaque. Degree of stenosis less than 50% bilaterally.    2. Right vertebral not visualized. Left vertebral patent with antegrade flow.    BRAIN MRI/MRA MRI HEAD FINDINGS    Diffusion imaging does not show any acute or subacute infarction. There chronic small-vessel ischemic changes throughout the pons. There are old bilateral small vessel cerebellar infarctions cerebral hemispheres show old cortical and subcortical infarctions in the right temporoparietal region, the right parietal region and both frontal regions. There is confluent chronic small vessel ischemic change affecting the white matter there is old left thalamic lacunar infarction. No evidence of neoplastic mass lesion, acute hemorrhage, hydrocephalus or extra-axial collection. There are a few foci of hemosiderin deposition related to old infarctions. No pituitary mass. No inflammatory sinus disease. No skull or skullbase lesion.    MRA HEAD FINDINGS    Both internal carotid arteries are widely patent through the skullbase. No siphon stenosis. The anterior and middle cerebral vessels are patent without proximal stenosis, aneurysm or vascular malformation. There are areas of narrowing in the more distal MCA branch vessels, more notable on the right than the left.    The left vertebral artery is a large vessel widely patent to the basilar. No antegrade flow in the right vertebral artery at the foramen magnum. Mild retrograde flow in the distal right vertebral artery. No basilar stenosis. Superior cerebellar and posterior cerebral arteries show flow bilaterally, with distal narrowing and irregularity   in the PCA branches.    IMPRESSION: No acute infarction.    Chronic small-vessel  ischemic change throughout the brain. Old cortical infarctions affecting both hemispheres.    No antegrade flow in the right vertebral artery.    Medium to small vessel intracranial atherosclerosis diffusely, most evident in the MCA and PCA branches.    TTE ? Moderate LVH with sigmoid basal septum, LVEF 55-60%. Grade 1     diastolic dysfunction with increased LV filling pressure. MAC     with trivial mitral regurgitation. Mild to moderate calcific     aortic stenosis. Trivial aortic regurgitation.      The brain MRI/MRA are reviewed in person. There is severe confluent chronic white matter ischemic changes. There are watershed chronic cortical infarcts involving the right occipital parietal region, left frontal lesion and right frontal lobe. There are also moderate infarct involving the superior right cerebellum and the superior left cerebellum. No hemorrhage.There are bilateral moderate disease involving the PCA and MCA.      Rebekha Diveley A. Merlene Laughter, M.D.  Diplomate, Tax adviser of Psychiatry and Neurology ( Neurology). 04/29/2015, 6:39 PM

## 2015-04-29 NOTE — Evaluation (Signed)
Occupational Therapy Evaluation Patient Details Name: [name] MRN: [id number] DOB: September 11, 1933 Today's Date: 04/29/2015    History of Present Illness [name] is an right handed 82 y.o. male severe vasculopath, with hx of CAD s/p CABG, ICA disease s/p PTCA of vetebral artery many years ago, hx of anxiety, DM, hypothryroidism, HTN, OSA with CPAP use, obesity, diastolic CHF, already on dual antiplatelet therapy, presented to the ER with ictus 1.5 hours prior to arrival, having slurred speech, right sided weakness, and right facial droop. In the ER, his symptoms resolved, and when I saw him, he had no residual symptom. Code stroke was called, and teleneuropathy did not recommend TPA due to improvement of symptoms. 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    Clinical Impression   Pt awake, alert, oriented to person and place, wife present for evaluation. Pt very talkative, requiring consistent redirection during evaluation. Pt reports original symptoms resolved quickly-right sided weakness & facial droop. Pt demonstrates BUE strength WFL, LUE ROM slightly limited at baseline. Coordination and sensation are intact. Pt required min A for 1st sit to stand, supervision for 2nd sit to stand. Pt with HR 50-60 while seated at EOB, HR increasing to 100-118 after standing for approximately 30 seconds, returning to 50-60 in sitting. Per pt and pt wife report pt requires assistance for donning socks/shoes and washing/drying back after showering tasks, otherwise is independent in ADL tasks. No further OT services at this time, defer to PT for follow-up recommendations if applicable.     Follow Up Recommendations  No OT follow up;Other (comment) (Defer to PT for follow up recommendations)    Equipment Recommendations  None recommended by OT       Precautions / Restrictions Precautions Precautions: Fall      Mobility Bed Mobility Overal bed mobility: Modified Independent                 Transfers Overall transfer level: Needs assistance   Transfers: Sit to/from Stand Sit to Stand: Min assist;From elevated surface                   ADL Overall ADL's : Needs assistance/impaired                     Lower Body Dressing: Maximal assistance Lower Body Dressing Details (indicate cue type and reason): Pt requires assistance for donning socks/shoes                     Vision Vision Assessment?: No apparent visual deficits          Pertinent Vitals/Pain Pain Assessment: No/denies pain     Hand Dominance Right   Extremity/Trunk Assessment Upper Extremity Assessment Upper Extremity Assessment: Overall WFL for tasks assessed   Lower Extremity Assessment Lower Extremity Assessment: Defer to PT evaluation       Communication Communication Communication: HOH   Cognition Arousal/Alertness: Awake/alert Behavior During Therapy: WFL for tasks assessed/performed Overall Cognitive Status: Within Functional Limits for tasks assessed                                Home Living Family/patient expects to be discharged to:: Private residence Living Arrangements: Spouse/significant other Available Help at Discharge: Family Type of Home: House             Bathroom Shower/Tub: Teacher, early years/pre: Standard  Home Equipment: Mucarabones - 2 wheels;Cane - single point          Prior Functioning/Environment Level of Independence: Needs assistance  Gait / Transfers Assistance Needed: Pt uses single point cane for functional mobility ADL's / Homemaking Assistance Needed: Pt wife assists with donning socks and shoes         End of Session Equipment Utilized During Treatment: Gait belt;Rolling walker  Activity Tolerance: Patient tolerated treatment well Patient left: in bed;with call bell/phone within reach;with family/visitor present   Time: [id number] OT Time Calculation (min): 23  min Charges:  OT General Charges $OT Visit: 1 Procedure OT Evaluation $OT Eval Low Complexity: 1 Procedure G-Codes: OT G-codes **NOT FOR INPATIENT CLASS** Functional Assessment Tool Used: clinical judgement Functional Limitation: Self care Self Care Current Status [id number]): At least 20 percent but less than 40 percent impaired, limited or restricted Self Care Goal Status [id number]): At least 20 percent but less than 40 percent impaired, limited or restricted Self Care Discharge Status [phone]): At least 20 percent but less than 40 percent impaired, limited or restricted  [name], OTR/L  [phone]  04/29/2015, 9:52 AM

## 2015-04-29 NOTE — Progress Notes (Signed)
Subjective: He was admitted with a TIA. He is doing better. No focal neurological findings now but he is confused.  Objective: Vital signs in last 24 hours: Temp:  [97 F (36.1 C)-98.1 F (36.7 C)] 97 F (36.1 C) (03/02 0826) Pulse Rate:  [27-145] 66 (03/02 0500) Resp:  [10-29] 21 (03/02 0500) BP: (130-179)/(62-146) 179/73 mmHg (03/02 0400) SpO2:  [93 %-100 %] 96 % (03/02 0808) Weight:  [117.6 kg (259 lb 4.2 oz)] 117.6 kg (259 lb 4.2 oz) (03/02 0500) Weight change: -1.696 kg (-3 lb 11.8 oz) Last BM Date: 04/26/15  Intake/Output from previous day: 03/01 0701 - 03/02 0700 In: 480 [P.O.:480] Out: 2165 [Urine:2165]  PHYSICAL EXAM General appearance: alert, cooperative, mild distress and Confused Resp: rhonchi bilaterally Cardio: regular rate and rhythm, S1, S2 normal, no murmur, click, rub or gallop GI: soft, non-tender; bowel sounds normal; no masses,  no organomegaly Extremities: extremities normal, atraumatic, no cyanosis or edema  Lab Results:  Results for orders placed or performed during the hospital encounter of 04/14/2015 (from the past 48 hour(s))  Protime-INR     Status: None   Collection Time: 04/10/2015  5:41 PM  Result Value Ref Range   Prothrombin Time 14.7 11.6 - 15.2 seconds   INR 1.13 0.00 - 1.49  APTT     Status: None   Collection Time: 04/18/2015  5:41 PM  Result Value Ref Range   aPTT 32 24 - 37 seconds  CBC     Status: Abnormal   Collection Time: 04/04/2015  5:41 PM  Result Value Ref Range   WBC 13.3 (H) 4.0 - 10.5 K/uL   RBC 4.15 (L) 4.22 - 5.81 MIL/uL   Hemoglobin 13.0 13.0 - 17.0 g/dL   HCT 40.0 39.0 - 52.0 %   MCV 96.4 78.0 - 100.0 fL   MCH 31.3 26.0 - 34.0 pg   MCHC 32.5 30.0 - 36.0 g/dL   RDW 15.2 11.5 - 15.5 %   Platelets 273 150 - 400 K/uL  Differential     Status: Abnormal   Collection Time: 04/05/2015  5:41 PM  Result Value Ref Range   Neutrophils Relative % 73 %   Neutro Abs 9.7 (H) 1.7 - 7.7 K/uL   Lymphocytes Relative 16 %   Lymphs Abs  2.2 0.7 - 4.0 K/uL   Monocytes Relative 9 %   Monocytes Absolute 1.2 (H) 0.1 - 1.0 K/uL   Eosinophils Relative 2 %   Eosinophils Absolute 0.3 0.0 - 0.7 K/uL   Basophils Relative 0 %   Basophils Absolute 0.0 0.0 - 0.1 K/uL  Comprehensive metabolic panel     Status: Abnormal   Collection Time: 04/18/2015  5:41 PM  Result Value Ref Range   Sodium 135 135 - 145 mmol/L   Potassium 4.7 3.5 - 5.1 mmol/L   Chloride 104 101 - 111 mmol/L   CO2 26 22 - 32 mmol/L   Glucose, Bld 227 (H) 65 - 99 mg/dL   BUN 27 (H) 6 - 20 mg/dL   Creatinine, Ser 1.93 (H) 0.61 - 1.24 mg/dL   Calcium 9.0 8.9 - 10.3 mg/dL   Total Protein 7.2 6.5 - 8.1 g/dL   Albumin 3.5 3.5 - 5.0 g/dL   AST 20 15 - 41 U/L   ALT 18 17 - 63 U/L   Alkaline Phosphatase 109 38 - 126 U/L   Total Bilirubin 0.8 0.3 - 1.2 mg/dL   GFR calc non Af Amer 31 (L) >60 mL/min  GFR calc Af Amer 36 (L) >60 mL/min    Comment: (NOTE) The eGFR has been calculated using the CKD EPI equation. This calculation has not been validated in all clinical situations. eGFR's persistently <60 mL/min signify possible Chronic Kidney Disease.    Anion gap 5 5 - 15  Hemoglobin A1c     Status: Abnormal   Collection Time: 04/22/2015  5:48 PM  Result Value Ref Range   Hgb A1c MFr Bld 6.7 (H) 4.8 - 5.6 %    Comment: (NOTE)         Pre-diabetes: 5.7 - 6.4         Diabetes: >6.4         Glycemic control for adults with diabetes: <7.0    Mean Plasma Glucose 146 mg/dL    Comment: (NOTE) Performed At: [facility] [address] [place], [place] [id number] Lindon Romp MD [id number]   CBG monitoring, ED     Status: Abnormal   Collection Time: 04/24/2015  5:52 PM  Result Value Ref Range   Glucose-Capillary 223 (H) 65 - 99 mg/dL  Hemoglobin A1c     Status: Abnormal   Collection Time: 04/25/2015  5:54 PM  Result Value Ref Range   Hgb A1c MFr Bld 6.8 (H) 4.8 - 5.6 %    Comment: (NOTE)         Pre-diabetes: 5.7 - 6.4         Diabetes: >6.4          Glycemic control for adults with diabetes: <7.0    Mean Plasma Glucose 148 mg/dL    Comment: (NOTE) Performed At: [facility] [address] [place], [place] [id number] Lindon Romp MD [id number]   I-stat troponin, ED (not at [facility], [facility])     Status: None   Collection Time: 04/24/2015  5:57 PM  Result Value Ref Range   Troponin i, poc 0.03 0.00 - 0.08 ng/mL   Comment 3            Comment: Due to the release kinetics of cTnI, a negative result within the first hours of the onset of symptoms does not rule out myocardial infarction with certainty. If myocardial infarction is still suspected, repeat the test at appropriate intervals.   I-Stat Chem 8, ED  (not at [facility], [facility])     Status: Abnormal   Collection Time: 04/19/2015  6:00 PM  Result Value Ref Range   Sodium 138 135 - 145 mmol/L   Potassium 6.1 (HH) 3.5 - 5.1 mmol/L   Chloride 103 101 - 111 mmol/L   BUN 38 (H) 6 - 20 mg/dL   Creatinine, Ser 1.90 (H) 0.61 - 1.24 mg/dL   Glucose, Bld 219 (H) 65 - 99 mg/dL   Calcium, Ion 1.18 1.13 - 1.30 mmol/L   TCO2 27 0 - 100 mmol/L   Hemoglobin 14.3 13.0 - 17.0 g/dL   HCT 42.0 39.0 - 52.0 %  MRSA PCR Screening     Status: None   Collection Time: 04/18/2015  9:26 PM  Result Value Ref Range   MRSA by PCR NEGATIVE NEGATIVE    Comment:        The GeneXpert MRSA Assay (FDA approved for NASAL specimens only), is one component of a comprehensive MRSA colonization surveillance program. It is not intended to diagnose MRSA infection nor to guide or monitor treatment for MRSA infections.   Glucose, capillary     Status: Abnormal   Collection Time: 04/28/15 12:04 AM  Result Value Ref Range   Glucose-Capillary 109 (H) 65 - 99 mg/dL   Comment 1 Notify RN   Lipid panel     Status: Abnormal   Collection Time: 04/28/15  5:18 AM  Result Value Ref Range   Cholesterol 119 0 - 200 mg/dL   Triglycerides 123 <150 mg/dL   HDL 25 (L) >40 mg/dL   Total CHOL/HDL Ratio 4.8 RATIO   VLDL 25 0  - 40 mg/dL   LDL Cholesterol 69 0 - 99 mg/dL    Comment:        Total Cholesterol/HDL:CHD Risk Coronary Heart Disease Risk Table                     Men   Women  1/2 Average Risk   3.4   3.3  Average Risk       5.0   4.4  2 X Average Risk   9.6   7.1  3 X Average Risk  23.4   11.0        Use the calculated Patient Ratio above and the CHD Risk Table to determine the patient's CHD Risk.        ATP III CLASSIFICATION (LDL):  <100     mg/dL   Optimal  100-129  mg/dL   Near or Above                    Optimal  130-159  mg/dL   Borderline  160-189  mg/dL   High  >190     mg/dL   Very High   Basic metabolic panel     Status: Abnormal   Collection Time: 04/28/15  5:18 AM  Result Value Ref Range   Sodium 140 135 - 145 mmol/L   Potassium 4.2 3.5 - 5.1 mmol/L    Comment: DELTA CHECK NOTED   Chloride 107 101 - 111 mmol/L   CO2 24 22 - 32 mmol/L   Glucose, Bld 53 (L) 65 - 99 mg/dL   BUN 25 (H) 6 - 20 mg/dL   Creatinine, Ser 1.72 (H) 0.61 - 1.24 mg/dL   Calcium 8.9 8.9 - 10.3 mg/dL   GFR calc non Af Amer 36 (L) >60 mL/min   GFR calc Af Amer 42 (L) >60 mL/min    Comment: (NOTE) The eGFR has been calculated using the CKD EPI equation. This calculation has not been validated in all clinical situations. eGFR's persistently <60 mL/min signify possible Chronic Kidney Disease.    Anion gap 9 5 - 15  Glucose, capillary     Status: Abnormal   Collection Time: 04/28/15  5:26 AM  Result Value Ref Range   Glucose-Capillary 54 (L) 65 - 99 mg/dL  Glucose, capillary     Status: None   Collection Time: 04/28/15  6:04 AM  Result Value Ref Range   Glucose-Capillary 80 65 - 99 mg/dL   Comment 1 Notify RN   Glucose, capillary     Status: None   Collection Time: 04/28/15  7:13 AM  Result Value Ref Range   Glucose-Capillary 90 65 - 99 mg/dL  Glucose, capillary     Status: None   Collection Time: 04/28/15  9:19 AM  Result Value Ref Range   Glucose-Capillary 67 65 - 99 mg/dL  Glucose,  capillary     Status: Abnormal   Collection Time: 04/28/15 11:34 AM  Result Value Ref Range   Glucose-Capillary 102 (H) 65 - 99 mg/dL  Glucose, capillary  Status: None   Collection Time: 04/28/15  4:14 PM  Result Value Ref Range   Glucose-Capillary 75 65 - 99 mg/dL   Comment 1 Notify RN    Comment 2 Document in Chart   Glucose, capillary     Status: Abnormal   Collection Time: 04/28/15  8:26 PM  Result Value Ref Range   Glucose-Capillary 120 (H) 65 - 99 mg/dL  Glucose, capillary     Status: Abnormal   Collection Time: 04/29/15 12:16 AM  Result Value Ref Range   Glucose-Capillary 163 (H) 65 - 99 mg/dL  Glucose, capillary     Status: Abnormal   Collection Time: 04/29/15  4:29 AM  Result Value Ref Range   Glucose-Capillary 102 (H) 65 - 99 mg/dL  Glucose, capillary     Status: Abnormal   Collection Time: 04/29/15  5:33 AM  Result Value Ref Range   Glucose-Capillary 132 (H) 65 - 99 mg/dL  Glucose, capillary     Status: Abnormal   Collection Time: 04/29/15  7:24 AM  Result Value Ref Range   Glucose-Capillary 155 (H) 65 - 99 mg/dL   Comment 1 Notify RN    Comment 2 Document in Chart     ABGS  Recent Labs  04/06/2015 1800  TCO2 27   CULTURES Recent Results (from the past 240 hour(s))  Microscopic Examination     Status: Abnormal   Collection Time: 04/26/15  9:28 AM  Result Value Ref Range Status   WBC, UA >30 (A) 0 -  5 /hpf Final   RBC, UA 0-2 0 -  2 /hpf Final   Epithelial Cells (non renal) 0-10 0 - 10 /hpf Final   Casts Present (A) None seen /lpf Final   Cast Type Hyaline casts N/A Final   Mucus, UA Present Not Estab. Final   Bacteria, UA Few None seen/Few Final  MRSA PCR Screening     Status: None   Collection Time: 04/15/2015  9:26 PM  Result Value Ref Range Status   MRSA by PCR NEGATIVE NEGATIVE Final    Comment:        The GeneXpert MRSA Assay (FDA approved for NASAL specimens only), is one component of a comprehensive MRSA colonization surveillance  program. It is not intended to diagnose MRSA infection nor to guide or monitor treatment for MRSA infections.    Studies/Results: Ct Head Wo Contrast  04/23/2015  CLINICAL DATA:  Code stroke.  Confusion.  Right-sided droop. EXAM: CT HEAD WITHOUT CONTRAST TECHNIQUE: Contiguous axial images were obtained from the base of the skull through the vertex without intravenous contrast. COMPARISON:  06/16/2013 FINDINGS: There is mild diffuse low-attenuation within the subcortical and periventricular white matter compatible with chronic microvascular disease. Encephalomalacia within the left frontal lobe is again noted compatible with previous infarct. Right posterior parietal encephalomalacia is also again noted. Progressive encephalomalacia involving the right frontal lobe is noted. Chronic infarcts involving bilateral cerebellar hemispheres are identified, image 12 of series 2. No evidence for acute intracranial hemorrhage, mass or acute cortical infarct. The paranasal sinuses are clear. The mastoid air cells are clear. The calvarium is intact. IMPRESSION: 1. No acute intracranial abnormalities. 2. Advanced small vessel ischemic disease 3. Chronic bilateral cerebral and cerebellar infarcts. Electronically Signed   By: Kerby Moors M.D.   On: 04/08/2015 17:55   Mr Jodene Nam Head Wo Contrast  04/28/2015  CLINICAL DATA:  Slurred speech and right-sided weakness. Right facial droop. Symptoms began yesterday. EXAM: MRI HEAD WITHOUT CONTRAST MRA HEAD  WITHOUT CONTRAST TECHNIQUE: Multiplanar, multiecho pulse sequences of the brain and surrounding structures were obtained without intravenous contrast. Angiographic images of the head were obtained using MRA technique without contrast. COMPARISON:  CT 04/24/2015.  MRI 06/16/2013. FINDINGS: MRI HEAD FINDINGS Diffusion imaging does not show any acute or subacute infarction. There chronic small-vessel ischemic changes throughout the pons. There are old bilateral small vessel  cerebellar infarctions cerebral hemispheres show old cortical and subcortical infarctions in the right temporoparietal region, the right parietal region and both frontal regions. There is confluent chronic small vessel ischemic change affecting the white matter there is old left thalamic lacunar infarction. No evidence of neoplastic mass lesion, acute hemorrhage, hydrocephalus or extra-axial collection. There are a few foci of hemosiderin deposition related to old infarctions. No pituitary mass. No inflammatory sinus disease. No skull or skullbase lesion. MRA HEAD FINDINGS Both internal carotid arteries are widely patent through the skullbase. No siphon stenosis. The anterior and middle cerebral vessels are patent without proximal stenosis, aneurysm or vascular malformation. There are areas of narrowing in the more distal MCA branch vessels, more notable on the right than the left. The left vertebral artery is a large vessel widely patent to the basilar. No antegrade flow in the right vertebral artery at the foramen magnum. Mild retrograde flow in the distal right vertebral artery. No basilar stenosis. Superior cerebellar and posterior cerebral arteries show flow bilaterally, with distal narrowing and irregularity in the PCA branches. IMPRESSION: No acute infarction. Chronic small-vessel ischemic change throughout the brain. Old cortical infarctions affecting both hemispheres. No antegrade flow in the right vertebral artery. Medium to small vessel intracranial atherosclerosis diffusely, most evident in the MCA and PCA branches. Electronically Signed   By: Nelson Chimes M.D.   On: 04/28/2015 08:30   Mr Brain Wo Contrast  04/28/2015  CLINICAL DATA:  Slurred speech and right-sided weakness. Right facial droop. Symptoms began yesterday. EXAM: MRI HEAD WITHOUT CONTRAST MRA HEAD WITHOUT CONTRAST TECHNIQUE: Multiplanar, multiecho pulse sequences of the brain and surrounding structures were obtained without intravenous  contrast. Angiographic images of the head were obtained using MRA technique without contrast. COMPARISON:  CT 04/10/2015.  MRI 06/16/2013. FINDINGS: MRI HEAD FINDINGS Diffusion imaging does not show any acute or subacute infarction. There chronic small-vessel ischemic changes throughout the pons. There are old bilateral small vessel cerebellar infarctions cerebral hemispheres show old cortical and subcortical infarctions in the right temporoparietal region, the right parietal region and both frontal regions. There is confluent chronic small vessel ischemic change affecting the white matter there is old left thalamic lacunar infarction. No evidence of neoplastic mass lesion, acute hemorrhage, hydrocephalus or extra-axial collection. There are a few foci of hemosiderin deposition related to old infarctions. No pituitary mass. No inflammatory sinus disease. No skull or skullbase lesion. MRA HEAD FINDINGS Both internal carotid arteries are widely patent through the skullbase. No siphon stenosis. The anterior and middle cerebral vessels are patent without proximal stenosis, aneurysm or vascular malformation. There are areas of narrowing in the more distal MCA branch vessels, more notable on the right than the left. The left vertebral artery is a large vessel widely patent to the basilar. No antegrade flow in the right vertebral artery at the foramen magnum. Mild retrograde flow in the distal right vertebral artery. No basilar stenosis. Superior cerebellar and posterior cerebral arteries show flow bilaterally, with distal narrowing and irregularity in the PCA branches. IMPRESSION: No acute infarction. Chronic small-vessel ischemic change throughout the brain. Old cortical infarctions affecting both  hemispheres. No antegrade flow in the right vertebral artery. Medium to small vessel intracranial atherosclerosis diffusely, most evident in the MCA and PCA branches. Electronically Signed   By: Nelson Chimes M.D.   On:  04/28/2015 08:30   US Carotid Bilateral  04/28/2015  CLINICAL DATA:  TIA. EXAM: BILATERAL CAROTID DUPLEX ULTRASOUND TECHNIQUE: Pearline Cables scale imaging, color Doppler and duplex ultrasound were performed of bilateral carotid and vertebral arteries in the neck. COMPARISON:  MRI 04/28/2015. FINDINGS: Criteria: Quantification of carotid stenosis is based on velocity parameters that correlate the residual internal carotid diameter with NASCET-based stenosis levels, using the diameter of the distal internal carotid lumen as the denominator for stenosis measurement. The following velocity measurements were obtained: RIGHT ICA:  195/22 cm/sec CCA:  591/6 cm/sec SYSTOLIC ICA/CCA RATIO:  1.3 DIASTOLIC ICA/CCA RATIO:  3.0 ECA:  248 cm/sec LEFT ICA:  122/22 cm/sec CCA:  38/4 cm/sec SYSTOLIC ICA/CCA RATIO:  1.8 DIASTOLIC ICA/CCA RATIO:  2.8 ECA:  67 cm/sec RIGHT CAROTID ARTERY: Right carotid bifurcation moderate calcified plaque. Degree of stenosis less than 50%. RIGHT VERTEBRAL ARTERY:  Not visualized. LEFT CAROTID ARTERY: Right carotid bifurcation moderate calcified plaque. Degree of stenosis less than 50%. LEFT VERTEBRAL ARTERY:  Patent with antegrade flow. Exam limited by patient movement. IMPRESSION: 1. Moderate bilateral carotid bifurcation calcified plaque. Degree of stenosis less than 50% bilaterally. 2. Right vertebral not visualized. Left vertebral patent with antegrade flow. Electronically Signed   By: Marcello Moores  Register   On: 04/28/2015 16:42    Medications:  Prior to Admission:  Prescriptions prior to admission  Medication Sig Dispense Refill Last Dose  . allopurinol (ZYLOPRIM) 300 MG tablet Take 150 mg by mouth at bedtime.    04/26/2015 at Unknown time  . aspirin EC 81 MG tablet Take 81 mg by mouth every morning.    04/25/2015 at Unknown time  . atorvastatin (LIPITOR) 40 MG tablet TAKE 1 TABLET BY MOUTH DAILY. 30 tablet 0 04/06/2015 at Unknown time  . Cholecalciferol (VITAMIN D3) 1000 UNITS CAPS Take 1 capsule by  mouth 2 (two) times daily.    04/13/2015 at Unknown time  . ciprofloxacin-fluocinolone PF (OTOVEL) 0.3-0.025 % SOLN Place 0.25 mLs in ear(s) 2 (two) times daily as needed (for drainage).   04/22/2015 at Unknown time  . clopidogrel (PLAVIX) 75 MG tablet TAKE ONE TABLET BY MOUTH ONCE DAILY. 90 tablet 0 04/03/2015 at Unknown time  . furosemide (LASIX) 40 MG tablet Take 1 tablet by mouth 2 (two) times daily.   04/07/2015 at    . gabapentin (NEURONTIN) 300 MG capsule Take 300 mg by mouth 4 (four) times daily as needed (takes three times daily but may take one in between if needed).   04/24/2015 at Unknown time  . glipiZIDE (GLUCOTROL) 10 MG tablet Take 10 mg by mouth daily.    04/20/2015 at Unknown time  . HYDROcodone-acetaminophen (NORCO) 10-325 MG per tablet Take 1 tablet by mouth 4 (four) times daily as needed for pain.   04/07/2015 at Unknown time  . insulin glargine (LANTUS) 100 UNIT/ML injection Inject 60 Units into the skin 2 (two) times daily.    04/11/2015 at Unknown time  . isosorbide dinitrate (ISORDIL) 20 MG tablet Take 1 tablet (20 mg total) by mouth 3 (three) times daily. 90 tablet 10 04/12/2015 at Unknown time  . levothyroxine (SYNTHROID, LEVOTHROID) 150 MCG tablet Take 150 mcg by mouth daily before breakfast.   04/05/2015 at Unknown time  . mometasone (ELOCON) 0.1 % ointment Apply 1  application topically as directed.    unknown  . nitroGLYCERIN (NITROSTAT) 0.4 MG SL tablet Place 1 tablet (0.4 mg total) under the tongue every 5 (five) minutes as needed for chest pain. 25 tablet 2 unknown  . omeprazole (PRILOSEC) 20 MG capsule Take 20 mg by mouth at bedtime.    04/26/2015 at Unknown time  . [name] (JANUVIA) 100 MG tablet Take 50 mg by mouth 2 (two) times daily.    04/20/2015 at Unknown time  . Tiotropium Bromide Monohydrate (SPIRIVA HANDIHALER IN) Inhale 1 capsule into the lungs every morning.    04/03/2015 at Unknown time  . triamcinolone cream (KENALOG) 0.1 % Apply 1 application topically daily as  needed (for irritation).    unknown   Scheduled: . acarbose  100 mg Oral TID WC  . allopurinol  150 mg Oral QHS  . amLODipine  10 mg Oral Daily  . aspirin EC  81 mg Oral q morning - 10a  . atorvastatin  40 mg Oral q1800  . clopidogrel  75 mg Oral Daily  . furosemide  40 mg Oral BID  . glipiZIDE  10 mg Oral QAC breakfast  . heparin  5,000 Units Subcutaneous 3 times per day  . insulin aspart  0-20 Units Subcutaneous 6 times per day  . isosorbide dinitrate  20 mg Oral TID  . levothyroxine  150 mcg Oral QAC breakfast  . linagliptin  5 mg Oral Daily  . pantoprazole  40 mg Oral Daily  . tiotropium  18 mcg Inhalation q morning - 10a   Continuous:  BRA:[id number], LORazepam, senna-docusate  Assesment: He was admitted with a TIA. He is better but confused. He had some cardiac arrhythmia yesterday but doesn't seem to be having any today. He has multiple other medical problems as noted and has severe vascular disease at baseline. Principal Problem:   TIA (transient ischemic attack) Active Problems:   Chronic diastolic heart failure (HCC)   DM (diabetes mellitus) ([place])   Hypothyroidism   HTN (hypertension)   CAD, CABG '98, cath '08, low risk Myoview Feb 2012, stable cath 12/19/12 with chronically occluded VG to LCX, patent LIMA-LAD & VG to dominant rt.    Obesity   Depression   Sleep apnea, on C-pap   PVD, Lt Vertebral artery PTA in '08, known moderate ICA disease   CKD (chronic kidney disease) stage 3, GFR 30-59 ml/min   Hyperlipidemia   Anxiety    Plan: Plan for today is to get PT OT and speech consultations which could not be done yesterday because he was too sleepy. Neurology consultation. Depending on results of those findings he could potentially be discharged tomorrow.      Bradan Congrove L 04/29/2015, 9:28 AM

## 2015-04-29 NOTE — Evaluation (Signed)
Speech Language Pathology Evaluation Patient Details Name: [name] MRN: [id number] DOB: 31-Mar-1933 Today's Date: 04/29/2015 Time: [id number] SLP Time Calculation (min) (ACUTE ONLY): 55 min  Problem List:  Patient Active Problem List   Diagnosis Date Noted  . TIA (transient ischemic attack) 04/23/2015  . Anxiety 04/07/2015  . Abdominal pain 04/26/2015  . Anorexia 04/26/2015  . Abnormal weight loss 04/26/2015  . GERD (gastroesophageal reflux disease) 12/25/2013  . Anemia of chronic disease 12/25/2013  . Constipation 10/27/2013  . Other malaise and fatigue 10/27/2013  . Unspecified hypothyroidism 10/27/2013  . Bowel habit changes 07/16/2013  . Flatulence 07/16/2013  . Occipital pain 04/07/2013  . Hyperlipidemia 01/06/2013  . Angina effort, wtih positive stress test 12/20/2012  . CKD (chronic kidney disease) stage 3, GFR 30-59 ml/min 12/20/2012  . Bradycardia, to 30 12/20/2012  . Weak 12/17/2012  . Dizziness 12/17/2012  . Exertional angina-  12/09/2012  . Dyspnea 06/09/2011  . Sleep apnea, on C-pap 06/09/2011  . PVD, Lt Vertebral artery PTA in '08, known moderate ICA disease 06/09/2011  . Chronic diastolic heart failure ([place]) 06/01/2011  . UTI (lower urinary tract infection) 06/01/2011  . DM (diabetes mellitus) ([place]) 06/01/2011  . Hypothyroidism 06/01/2011  . HTN (hypertension) 06/01/2011  . COPD (chronic obstructive pulmonary disease) ([place]) 06/01/2011  . CAD, CABG '98, cath '08, low risk Myoview Feb 2012, stable cath 12/19/12 with chronically occluded VG to LCX, patent LIMA-LAD & VG to dominant rt.  06/01/2011  . Obesity 06/01/2011  . Depression 06/01/2011  . Leg weakness, bilateral 10/26/2010  . Stiffness of joints, not elsewhere classified, multiple sites 10/26/2010  . Back pain, chronic, prior back surg 10/20/2010   Past Medical History:  Past Medical History  Diagnosis Date  . Diabetes mellitus type II   . Hypothyroidism   . Essential hypertension   . GERD  (gastroesophageal reflux disease)   . Chronic diastolic heart failure ([place])   . Heart attack ([place])   . COPD (chronic obstructive pulmonary disease) ([place])   . Coronary artery disease     CABG 1998, cath 2014 - patent LIMA-LAD, occluded SVG-CFX, patent SVG-RCA  . Obesity   . PVD (peripheral vascular disease) ([place])     Left vertebral stent 1998  . OSA on CPAP   . Hyperlipidemia   . Chronic bronchitis ([place])   . Arthritis   . Gout   . Pneumonia 05/2011  . Bradycardia, to 30 12/20/2012  . Nephrolithiasis   . CKD (chronic kidney disease) stage 3, GFR 30-59 ml/min    Past Surgical History:  Past Surgical History  Procedure Laterality Date  . Back surgery    . Replantation finger Right 2000    "little finger" (12/17/2012)  . Cholecystectomy    . Tonsillectomy  ?1960's  . Knee arthroscopy Left 1990's  . Lumbar laminectomy/decompression microdiscectomy  04/11/2011    Procedure: LUMBAR LAMINECTOMY/DECOMPRESSION MICRODISCECTOMY 1 LEVEL;  Surgeon: Peggyann Shoals, MD;  Location: [facility];  Service: Neurosurgery;  Laterality: Right;  RIGHT Lumbar Three-Four laminectomy with resection of synovial cyst  . Angioplasty  '99, 00, March 06  . Tibia fracture surgery Right ~ 2012  . Coronary artery bypass graft  04/1996    "CABG X6" (12/17/2012)  . Cataract extraction w/ intraocular lens  implant, bilateral    . Lithotripsy      "once" (12/17/2012)  . Cardiac catheterization  April 2006; 12/19/2012  . Coronary angioplasty with stent placement      "I've had a total  of 3-4 stents put in" (12/17/2012)  . Fracture surgery    . Colonoscopy  08/09/2004    [id number] diverticula.  Remainder of colonic mucosa appeared normal/normal rectum  . Vertiberal artery stent      1998, 99% blocked.  . Colonoscopy N/A 07/31/2013    [id number] colonic polyp-removed as described above. Status post segmental biopsy to evaluate for microscopic colitis. bx tubular adenoma, asc colon folcal active colitis/lyphoid  aggregates (nonspecific)  . Left heart catheterization with coronary angiogram N/A 12/19/2012    Procedure: LEFT HEART CATHETERIZATION WITH CORONARY ANGIOGRAM;  Surgeon: Lorretta Harp, MD;  Location: [facility];  Service: Cardiovascular;  Laterality: N/A;   HPI:  [name] Yerger" [name] is a 82yo white male who presents to [facility] on 2/28 after sudden onset of R facial droop, dysarthria, and R hemiplegia, all of which resolved by time of arrival. PMH: CAD s/p CABG, ICAD, DM, hypothyroidism, OAS c CPAP, diastolic CHF. This visit, head CT reveals remote infarcts previously unaccounted for, and MRI is negative for acute infarct. Pt with reported episodes of confusion.   Assessment / Plan / Recommendation Clinical Impression  SLP administered a Speech Language Evaluation to assess cognitive linguistic functioning; Patient presents with MODERATE cognitive impairment. This visit, head CT reveals remote infarcts previously unaccounted for, and MRI is negative for acute infarct. Daughter reports prior to this sudden onset of confusion 2/28 patient was driving and was fully independent with no signs of memory loss or cognitive deficits. Daughter and wife present for evaluation were spoken with at length regarding pt's prior level and changes currently presenting. Nursing reports patient's cognitive status varies largely from "hour to hour" and that this morning patient was A&O X4 for up to 4 hours; however the patient is confused this afternoon and has demonstrated these inconsistent episodes of confusion since admission 2/28. Family reports patient has been "fidgety and agitated" this date although this was not observed during evaluation. The SLP administered the [facility] Cognitive Assessment (MoCA) and the pt demonstrated deficits in the areas of memory, problem solving, attention (highly distractable) and orientation (only oriented to person and year). He demonstrated difficulty processing simple and complex  instructions and required significant time to complete structured thought organization tasks (copy an object, draw a clock) utilizing executive funcitoning skills although he did accurately follow instructions when given additional time.  Nursing reports awaiting urinalysis results. Pt will benefit from skilled interventions to address these impairments and will benefit from SNF placement upon d/c. Patient is very motivated to return to prior level of function.    SLP Assessment       Follow Up Recommendations       Frequency and Duration min 2x/week  2 weeks      SLP Evaluation Prior Functioning  Cognitive/Linguistic Baseline: Within functional limits Type of Home: House  Lives With: Spouse Available Help at Discharge: Family Vocation: Retired   Associate Professor  Overall Cognitive Status: Impaired/Different from baseline Arousal/Alertness: Awake/alert Orientation Level: Oriented to person Attention: Focused Focused Attention: Impaired Focused Attention Impairment: Verbal complex Memory: Impaired Memory Impairment: Decreased recall of new information;Retrieval deficit;Decreased short term memory Decreased Short Term Memory: Verbal basic Awareness: Impaired Awareness Impairment: Anticipatory impairment Problem Solving: Impaired Problem Solving Impairment: Functional basic Executive Function: Reasoning;Sequencing;Decision Making;Self Monitoring;Organizing Reasoning: Impaired Reasoning Impairment: Verbal basic Sequencing: Impaired Sequencing Impairment: Verbal basic Organizing: Impaired Organizing Impairment: Verbal basic Decision Making: Impaired Decision Making Impairment: Verbal basic Self Monitoring: Impaired Self Monitoring Impairment: Verbal basic Behaviors:  (family reports  agitated and restless (NOT noted in session)) Safety/Judgment: Impaired    Comprehension  Auditory Comprehension Yes/No Questions: Within Functional Limits Commands: Within Functional Limits     Expression Verbal Expression Repetition: No impairment Naming: No impairment Pragmatics: No impairment Written Expression Dominant Hand: Right   Oral / Motor  Oral Motor/Sensory Function Overall Oral Motor/Sensory Function: Within functional limits Motor Speech Overall Motor Speech: Appears within functional limits for tasks assessed Articulation: Within functional limitis Intelligibility: Intelligible Motor Planning: Witnin functional limits   Gini Caputo H. Roddie Mc, CCC-SLP Speech Language Pathologist             Wende Bushy 04/29/2015, 4:05 PM

## 2015-04-29 NOTE — Progress Notes (Signed)
Primary cardiologist: Dr. Quay Burow  Seen for followup: Bradycardia  Subjective:    Still seems confused but conversive, sitting up in bed eating breakfast. Family in room indicates that he seems to be somewhat better but not at baseline.  Objective:   Temp:  [97 F (36.1 C)-98.1 F (36.7 C)] 97 F (36.1 C) (03/02 0400) Pulse Rate:  [27-145] 66 (03/02 0500) Resp:  [10-29] 21 (03/02 0500) BP: (130-179)/(62-146) 179/73 mmHg (03/02 0400) SpO2:  [93 %-100 %] 98 % (03/02 0500) Weight:  [259 lb 4.2 oz (117.6 kg)] 259 lb 4.2 oz (117.6 kg) (03/02 0500) Last BM Date: 04/26/15  Filed Weights   04/23/2015 1754 04/06/2015 2136 04/29/15 0500  Weight: 263 lb (119.296 kg) 259 lb 14.8 oz (117.9 kg) 259 lb 4.2 oz (117.6 kg)    Intake/Output Summary (Last 24 hours) at 04/29/15 0802 Last data filed at 04/29/15 0550  Gross per 24 hour  Intake    480 ml  Output   2165 ml  Net  -1685 ml    Telemetry: Sinus rhythm with prolonged PR interval and PACs.  Exam:  General: Elderly male, obese, no distress.  Lungs: Clear, nonlabored.  Cardiac: RRR with ectopy, 2/6 systolic murmur.  Extremities: Trace edema.  Lab Results:  Basic Metabolic Panel:  Recent Labs Lab 04/26/15 0928 04/17/2015 1741 04/23/2015 1800 04/28/15 0518  NA 140 135 138 140  K 4.5 4.7 6.1* 4.2  CL 99 104 103 107  CO2 20 26  --  24  GLUCOSE 168* 227* 219* 53*  BUN 26 27* 38* 25*  CREATININE 1.93* 1.93* 1.90* 1.72*  CALCIUM 9.4 9.0  --  8.9    Liver Function Tests:  Recent Labs Lab 04/26/15 0928 04/21/2015 1741  AST 24 20  ALT 25 18  ALKPHOS 146* 109  BILITOT 0.9 0.8  PROT 7.6 7.2  ALBUMIN 4.0 3.5    CBC:  Recent Labs Lab 04/26/15 0928 04/26/2015 1741 04/26/2015 1800  WBC 13.2* 13.3*  --   HGB  --  13.0 14.3  HCT 42.9 40.0 42.0  MCV 93 96.4  --   PLT 325 273  --     Coagulation:  Recent Labs Lab 04/11/2015 1741  INR 1.13    Carotid Dopplers 04/28/2015: IMPRESSION: 1. Moderate bilateral  carotid bifurcation calcified plaque. Degree of stenosis less than 50% bilaterally.  2. Right vertebral not visualized. Left vertebral patent with antegrade flow.  Echocardiogram 04/28/2015: Study Conclusions  - Left ventricle: The cavity size was normal. There was moderate concentric hypertrophy. Systolic function was normal. The estimated ejection fraction was in the range of 55% to 60%. Although no diagnostic regional wall motion abnormality was identified, this possibility cannot be completely excluded on the basis of this study. Doppler parameters are consistent with abnormal left ventricular relaxation (grade 1 diastolic dysfunction). Doppler parameters are consistent with high ventricular filling pressure. - Aortic valve: Moderately calcified annulus. Trileaflet; moderately thickened, moderately calcified leaflets. Cusp separation was reduced. There was mild to moderate stenosis. There was trivial regurgitation. Mean gradient (S): 12 mm Hg. Peak gradient (S): 21 mm Hg. Valve area by planimetry 1.7 cm^2. VTI ratio of LVOT to aortic valve: 0.45. Valve area (VTI): 1.28 cm^2. Valve area (Vmax): 1.3 cm^2. Valve area (Vmean): 1.39 cm^2. - Aortic root: The aortic root was mildly dilated. - Mitral valve: Calcified annulus. There was trivial regurgitation. - Right atrium: Central venous pressure (est): 3 mm Hg. - Tricuspid valve: There was physiologic regurgitation. -  Pulmonary arteries: Systolic pressure could not be accurately estimated. - Pericardium, extracardiac: There was no pericardial effusion.  Impressions:  - Moderate LVH with sigmoid basal septum, LVEF 55-60%. Grade 1 diastolic dysfunction with increased LV filling pressure. MAC with trivial mitral regurgitation. Mild to moderate calcific aortic stenosis. Trivial aortic regurgitation.   Medications:   Scheduled Medications: . acarbose  100 mg Oral TID WC  . allopurinol  150 mg  Oral QHS  . amLODipine  10 mg Oral Daily  . aspirin EC  81 mg Oral q morning - 10a  . atorvastatin  40 mg Oral q1800  . clopidogrel  75 mg Oral Daily  . furosemide  40 mg Oral BID  . glipiZIDE  10 mg Oral QAC breakfast  . heparin  5,000 Units Subcutaneous 3 times per day  . insulin aspart  0-20 Units Subcutaneous 6 times per day  . isosorbide dinitrate  20 mg Oral TID  . levothyroxine  150 mcg Oral QAC breakfast  . linagliptin  5 mg Oral Daily  . pantoprazole  40 mg Oral Daily  . tiotropium  18 mcg Inhalation q morning - 10a     PRN Medications:  gabapentin, LORazepam, senna-docusate   Assessment:   1. Intermittent bradycardia with evidence of transient second-degree type I and type II heart block. Heart rates have been relatively stable in the last 24 hours. No prolonged pauses. Telemetry monitoring continues.  2. CAD status post CABG 1998 with graft disease including an occluded SVG to circumflex as of 2014. He has had no progressive angina. Troponin I normal. LVEF 55-60% with grade 1 diastolic dysfunction.  3. CKD, stage 3. Creatinine 1.7 down from 1.9.  4. Mental status changes with suspected TIA per primary team. Head MRI did not show acute stroke. Still remains confused and may have some baseline dementia. Neurology consultation pending. Further workup per primary team.  5. Known PAD, previous left vertebral artery stent 1998. Carotid Dopplers at this time and a straight less than 50% bilateral ICA stenoses.   Plan/Discussion:    Discussed cardiac status with patient and family in room. For now would continue observation. Follow-up ECG. Continue aspirin, Plavix, Norvasc, Lipitor, Lasix, and Isordil.   Satira Sark, M.D., F.A.C.C.

## 2015-04-30 DIAGNOSIS — N390 Urinary tract infection, site not specified: Secondary | ICD-10-CM | POA: Diagnosis present

## 2015-04-30 DIAGNOSIS — I25709 Atherosclerosis of coronary artery bypass graft(s), unspecified, with unspecified angina pectoris: Secondary | ICD-10-CM | POA: Diagnosis not present

## 2015-04-30 DIAGNOSIS — F329 Major depressive disorder, single episode, unspecified: Secondary | ICD-10-CM | POA: Diagnosis present

## 2015-04-30 DIAGNOSIS — Z8673 Personal history of transient ischemic attack (TIA), and cerebral infarction without residual deficits: Secondary | ICD-10-CM | POA: Diagnosis not present

## 2015-04-30 DIAGNOSIS — R001 Bradycardia, unspecified: Secondary | ICD-10-CM | POA: Diagnosis present

## 2015-04-30 DIAGNOSIS — I5032 Chronic diastolic (congestive) heart failure: Secondary | ICD-10-CM | POA: Diagnosis present

## 2015-04-30 DIAGNOSIS — Z7902 Long term (current) use of antithrombotics/antiplatelets: Secondary | ICD-10-CM | POA: Diagnosis not present

## 2015-04-30 DIAGNOSIS — Z951 Presence of aortocoronary bypass graft: Secondary | ICD-10-CM | POA: Diagnosis not present

## 2015-04-30 DIAGNOSIS — E7800 Pure hypercholesterolemia, unspecified: Secondary | ICD-10-CM | POA: Diagnosis present

## 2015-04-30 DIAGNOSIS — M109 Gout, unspecified: Secondary | ICD-10-CM | POA: Diagnosis present

## 2015-04-30 DIAGNOSIS — F419 Anxiety disorder, unspecified: Secondary | ICD-10-CM | POA: Diagnosis present

## 2015-04-30 DIAGNOSIS — Z515 Encounter for palliative care: Secondary | ICD-10-CM | POA: Diagnosis present

## 2015-04-30 DIAGNOSIS — Z86010 Personal history of colonic polyps: Secondary | ICD-10-CM | POA: Diagnosis not present

## 2015-04-30 DIAGNOSIS — I252 Old myocardial infarction: Secondary | ICD-10-CM | POA: Diagnosis not present

## 2015-04-30 DIAGNOSIS — E669 Obesity, unspecified: Secondary | ICD-10-CM | POA: Diagnosis present

## 2015-04-30 DIAGNOSIS — G92 Toxic encephalopathy: Secondary | ICD-10-CM | POA: Diagnosis present

## 2015-04-30 DIAGNOSIS — Z87891 Personal history of nicotine dependence: Secondary | ICD-10-CM | POA: Diagnosis not present

## 2015-04-30 DIAGNOSIS — E039 Hypothyroidism, unspecified: Secondary | ICD-10-CM | POA: Diagnosis present

## 2015-04-30 DIAGNOSIS — Z794 Long term (current) use of insulin: Secondary | ICD-10-CM | POA: Diagnosis not present

## 2015-04-30 DIAGNOSIS — H9190 Unspecified hearing loss, unspecified ear: Secondary | ICD-10-CM | POA: Diagnosis present

## 2015-04-30 DIAGNOSIS — Z833 Family history of diabetes mellitus: Secondary | ICD-10-CM | POA: Diagnosis not present

## 2015-04-30 DIAGNOSIS — I441 Atrioventricular block, second degree: Secondary | ICD-10-CM | POA: Diagnosis present

## 2015-04-30 DIAGNOSIS — Z7982 Long term (current) use of aspirin: Secondary | ICD-10-CM | POA: Diagnosis not present

## 2015-04-30 DIAGNOSIS — K219 Gastro-esophageal reflux disease without esophagitis: Secondary | ICD-10-CM | POA: Diagnosis present

## 2015-04-30 DIAGNOSIS — G929 Unspecified toxic encephalopathy: Secondary | ICD-10-CM | POA: Diagnosis present

## 2015-04-30 DIAGNOSIS — Z79899 Other long term (current) drug therapy: Secondary | ICD-10-CM | POA: Diagnosis not present

## 2015-04-30 DIAGNOSIS — I350 Nonrheumatic aortic (valve) stenosis: Secondary | ICD-10-CM | POA: Diagnosis present

## 2015-04-30 DIAGNOSIS — G4730 Sleep apnea, unspecified: Secondary | ICD-10-CM | POA: Diagnosis present

## 2015-04-30 DIAGNOSIS — E785 Hyperlipidemia, unspecified: Secondary | ICD-10-CM | POA: Diagnosis present

## 2015-04-30 DIAGNOSIS — I130 Hypertensive heart and chronic kidney disease with heart failure and stage 1 through stage 4 chronic kidney disease, or unspecified chronic kidney disease: Secondary | ICD-10-CM | POA: Diagnosis present

## 2015-04-30 DIAGNOSIS — I739 Peripheral vascular disease, unspecified: Secondary | ICD-10-CM | POA: Diagnosis present

## 2015-04-30 DIAGNOSIS — Z87442 Personal history of urinary calculi: Secondary | ICD-10-CM | POA: Diagnosis not present

## 2015-04-30 DIAGNOSIS — G4733 Obstructive sleep apnea (adult) (pediatric): Secondary | ICD-10-CM | POA: Diagnosis present

## 2015-04-30 DIAGNOSIS — I639 Cerebral infarction, unspecified: Secondary | ICD-10-CM | POA: Diagnosis present

## 2015-04-30 DIAGNOSIS — Z955 Presence of coronary angioplasty implant and graft: Secondary | ICD-10-CM | POA: Diagnosis not present

## 2015-04-30 DIAGNOSIS — J449 Chronic obstructive pulmonary disease, unspecified: Secondary | ICD-10-CM | POA: Diagnosis present

## 2015-04-30 DIAGNOSIS — Z79891 Long term (current) use of opiate analgesic: Secondary | ICD-10-CM | POA: Diagnosis not present

## 2015-04-30 DIAGNOSIS — I2510 Atherosclerotic heart disease of native coronary artery without angina pectoris: Secondary | ICD-10-CM | POA: Diagnosis present

## 2015-04-30 DIAGNOSIS — L409 Psoriasis, unspecified: Secondary | ICD-10-CM | POA: Diagnosis present

## 2015-04-30 DIAGNOSIS — G459 Transient cerebral ischemic attack, unspecified: Secondary | ICD-10-CM | POA: Diagnosis not present

## 2015-04-30 DIAGNOSIS — Z66 Do not resuscitate: Secondary | ICD-10-CM | POA: Diagnosis present

## 2015-04-30 DIAGNOSIS — N183 Chronic kidney disease, stage 3 (moderate): Secondary | ICD-10-CM | POA: Diagnosis present

## 2015-04-30 DIAGNOSIS — E1122 Type 2 diabetes mellitus with diabetic chronic kidney disease: Secondary | ICD-10-CM | POA: Diagnosis present

## 2015-04-30 DIAGNOSIS — R4182 Altered mental status, unspecified: Secondary | ICD-10-CM | POA: Diagnosis present

## 2015-04-30 LAB — GLUCOSE, CAPILLARY
GLUCOSE-CAPILLARY: 140 mg/dL — AB (ref 65–99)
GLUCOSE-CAPILLARY: 148 mg/dL — AB (ref 65–99)
GLUCOSE-CAPILLARY: 178 mg/dL — AB (ref 65–99)
Glucose-Capillary: 149 mg/dL — ABNORMAL HIGH (ref 65–99)
Glucose-Capillary: 156 mg/dL — ABNORMAL HIGH (ref 65–99)
Glucose-Capillary: 168 mg/dL — ABNORMAL HIGH (ref 65–99)

## 2015-04-30 MED ORDER — HALOPERIDOL LACTATE 5 MG/ML IJ SOLN
10.0000 mg | Freq: Once | INTRAMUSCULAR | Status: AC
  Administered 2015-04-30: 10 mg via INTRAVENOUS
  Filled 2015-04-30: qty 2

## 2015-04-30 MED ORDER — LORAZEPAM 2 MG/ML IJ SOLN
1.0000 mg | INTRAMUSCULAR | Status: DC | PRN
  Administered 2015-04-30 – 2015-05-04 (×8): 1 mg via INTRAVENOUS
  Filled 2015-04-30 (×8): qty 1

## 2015-04-30 MED ORDER — HALOPERIDOL LACTATE 5 MG/ML IJ SOLN
5.0000 mg | Freq: Once | INTRAMUSCULAR | Status: AC
  Administered 2015-04-30: 5 mg via INTRAVENOUS
  Filled 2015-04-30: qty 1

## 2015-04-30 MED ORDER — HALOPERIDOL LACTATE 5 MG/ML IJ SOLN
5.0000 mg | Freq: Four times a day (QID) | INTRAMUSCULAR | Status: DC | PRN
  Administered 2015-04-30 – 2015-05-02 (×7): 5 mg via INTRAVENOUS
  Filled 2015-04-30 (×7): qty 1

## 2015-04-30 MED ORDER — FUROSEMIDE 10 MG/ML IJ SOLN
40.0000 mg | Freq: Two times a day (BID) | INTRAMUSCULAR | Status: DC
  Administered 2015-04-30 – 2015-05-03 (×8): 40 mg via INTRAVENOUS
  Filled 2015-04-30 (×8): qty 4

## 2015-04-30 MED ORDER — LEVOFLOXACIN IN D5W 750 MG/150ML IV SOLN
750.0000 mg | INTRAVENOUS | Status: DC
  Administered 2015-04-30 – 2015-05-02 (×2): 750 mg via INTRAVENOUS
  Filled 2015-04-30 (×2): qty 150

## 2015-04-30 NOTE — Progress Notes (Signed)
[facility] Progress Note Patient Name: [name] DOB: [birth year] MRN: [id number]   Date of Service  04/30/2015  HPI/Events of Note  Ongoing agitation.  Had been dosed with ativan with no change in status.  HD stable.  QTc less than 500  eICU Interventions  Plan: Haldol 5 mg IV times one. Nurse to contact [facility] MD in 30 minutes if patient remains agitated.     Intervention Category Major Interventions: Delirium, psychosis, severe agitation - evaluation and management  Tylique Aull 04/30/2015, 2:37 AM

## 2015-04-30 NOTE — Progress Notes (Signed)
Primary cardiologist: Dr. Quay Burow  Seen for followup: Bradycardia and heart block   Subjective:    Patient resting on CPAP this morning. Had difficult night with agitation.  Objective:   Temp:  [96.7 F (35.9 C)-97.4 F (36.3 C)] 96.7 F (35.9 C) (03/03 0821) Pulse Rate:  [30-160] 59 (03/03 0500) Resp:  [14-27] 17 (03/03 0500) BP: (86-177)/(61-104) 124/67 mmHg (03/03 0500) SpO2:  [78 %-100 %] 98 % (03/03 0500) Weight:  [258 lb 6.1 oz (117.2 kg)] 258 lb 6.1 oz (117.2 kg) (03/03 0500) Last BM Date: 04/26/15  Filed Weights   04/21/2015 2136 04/29/15 0500 04/30/15 0500  Weight: 259 lb 14.8 oz (117.9 kg) 259 lb 4.2 oz (117.6 kg) 258 lb 6.1 oz (117.2 kg)    Intake/Output Summary (Last 24 hours) at 04/30/15 0843 Last data filed at 04/30/15 0500  Gross per 24 hour  Intake    480 ml  Output    650 ml  Net   -170 ml    Telemetry: Sinus rhythm with PACs and brief atrial runs. No significant pauses or sustained bradycardia.  Exam:  General: Obese male, resting on CPAP.  Lungs: Decreased breath sounds.  Cardiac: Distant regular heart sounds with ectopy.  Extremities: Ankle edema.  Lab Results:  Basic Metabolic Panel:  Recent Labs Lab 04/26/15 0928 04/06/2015 1741 04/01/2015 1800 04/28/15 0518  NA 140 135 138 140  K 4.5 4.7 6.1* 4.2  CL 99 104 103 107  CO2 20 26  --  24  GLUCOSE 168* 227* 219* 53*  BUN 26 27* 38* 25*  CREATININE 1.93* 1.93* 1.90* 1.72*  CALCIUM 9.4 9.0  --  8.9    Liver Function Tests:  Recent Labs Lab 04/26/15 0928 04/16/2015 1741  AST 24 20  ALT 25 18  ALKPHOS 146* 109  BILITOT 0.9 0.8  PROT 7.6 7.2  ALBUMIN 4.0 3.5    CBC:  Recent Labs Lab 04/26/15 0928 04/14/2015 1741 04/05/2015 1800  WBC 13.2* 13.3*  --   HGB  --  13.0 14.3  HCT 42.9 40.0 42.0  MCV 93 96.4  --   PLT 325 273  --     Coagulation:  Recent Labs Lab 04/14/2015 1741  INR 1.13    Carotid Dopplers 04/28/2015: IMPRESSION: 1. Moderate bilateral carotid  bifurcation calcified plaque. Degree of stenosis less than 50% bilaterally.  2. Right vertebral not visualized. Left vertebral patent with antegrade flow.   Echocardiogram 04/28/2015: Study Conclusions  - Left ventricle: The cavity size was normal. There was moderate concentric hypertrophy. Systolic function was normal. The estimated ejection fraction was in the range of 55% to 60%. Although no diagnostic regional wall motion abnormality was identified, this possibility cannot be completely excluded on the basis of this study. Doppler parameters are consistent with abnormal left ventricular relaxation (grade 1 diastolic dysfunction). Doppler parameters are consistent with high ventricular filling pressure. - Aortic valve: Moderately calcified annulus. Trileaflet; moderately thickened, moderately calcified leaflets. Cusp separation was reduced. There was mild to moderate stenosis. There was trivial regurgitation. Mean gradient (S): 12 mm Hg. Peak gradient (S): 21 mm Hg. Valve area by planimetry 1.7 cm^2. VTI ratio of LVOT to aortic valve: 0.45. Valve area (VTI): 1.28 cm^2. Valve area (Vmax): 1.3 cm^2. Valve area (Vmean): 1.39 cm^2. - Aortic root: The aortic root was mildly dilated. - Mitral valve: Calcified annulus. There was trivial regurgitation. - Right atrium: Central venous pressure (est): 3 mm Hg. - Tricuspid valve: There was physiologic regurgitation. -  Pulmonary arteries: Systolic pressure could not be accurately estimated. - Pericardium, extracardiac: There was no pericardial effusion.  Impressions:  - Moderate LVH with sigmoid basal septum, LVEF 55-60%. Grade 1 diastolic dysfunction with increased LV filling pressure. MAC with trivial mitral regurgitation. Mild to moderate calcific aortic stenosis. Trivial aortic regurgitation.   Medications:   Scheduled Medications: . acarbose  100 mg Oral TID WC  . allopurinol  150 mg Oral QHS   . amLODipine  10 mg Oral Daily  . aspirin EC  81 mg Oral q morning - 10a  . atorvastatin  40 mg Oral q1800  . clopidogrel  75 mg Oral Daily  . furosemide  40 mg Intravenous Q12H  . heparin  5,000 Units Subcutaneous 3 times per day  . insulin aspart  0-20 Units Subcutaneous 6 times per day  . isosorbide dinitrate  20 mg Oral TID  . levofloxacin (LEVAQUIN) IV  500 mg Intravenous Q24H  . levothyroxine  150 mcg Oral QAC breakfast  . linagliptin  5 mg Oral Daily  . pantoprazole  40 mg Oral Daily  . tiotropium  18 mcg Inhalation q morning - 10a      PRN Medications:  gabapentin, haloperidol lactate, LORazepam, LORazepam, senna-docusate   Assessment:   1. Intermittent bradycardia with evidence of transient second-degree type I and type II heart block. Heart rates have been stable in the last 48 hours. No prolonged pauses. Telemetry monitoring continues.  2. Mental status changes with weakness and dysarthria, TIA suspected based on neurological evaluation. Brain MRI did not show any acute strokes. He did have evidence of old cortical infarcts in both hemispheres. No definite history of atrial fibrillation. Currently on aspirin and Plavix.  3. CAD status post CABG 1998 with graft disease including an occluded SVG to circumflex as of 2014. He has had no progressive angina. Troponin I normal. LVEF 55-60% with grade 1 diastolic dysfunction.  4. Moderate calcific aortic stenosis.  5. CKD, stage 3. Creatinine 1.7 down from 1.9.  6. Known PAD, previous left vertebral artery stent 1998. Carotid Dopplers at this time demostrate less than 50% bilateral ICA stenoses.   Plan/Discussion:    Discussed with patient's wife and son-in-law in room. Continue observation from a cardiac perspective. He is on telemetry monitoring, may need further outpatient monitoring to exclude PAF. As noted previously, no clear indication for pacemaker with no recurring bradycardia or pauses at this time. He will keep  follow-up with Dr. Gwenlyn Found following discharge.  Satira Sark, M.D., F.A.C.C.

## 2015-04-30 NOTE — Progress Notes (Signed)
Subjective: Neurology help noted and appreciated. He is extremely confused with agitation. He required IV Haldol last night. He is still requiring IV Ativan. In addition to that he required 2 nurses to help hold him down when he became combative. He also had family at bedside trying to help. This morning he sleeping. There is some question as to whether he has a urinary tract infection. He has a little bit of lower heart rate than yesterday but it still about 60.  Objective: Vital signs in last 24 hours: Temp:  [96.7 F (35.9 C)-97.4 F (36.3 C)] 96.7 F (35.9 C) (03/03 0821) Pulse Rate:  [30-160] 59 (03/03 0500) Resp:  [14-27] 17 (03/03 0500) BP: (86-177)/(61-104) 124/67 mmHg (03/03 0500) SpO2:  [78 %-100 %] 98 % (03/03 0500) Weight:  [117.2 kg (258 lb 6.1 oz)] 117.2 kg (258 lb 6.1 oz) (03/03 0500) Weight change: -0.4 kg (-14.1 oz) Last BM Date: 04/26/15  Intake/Output from previous day: 03/02 0701 - 03/03 0700 In: 720 [P.O.:720] Out: 1000 [Urine:1000]  PHYSICAL EXAM General appearance: He is sleeping now with CPAP on. He received Ativan about an hour ago Resp: clear to auscultation bilaterally Cardio: regular rate and rhythm, S1, S2 normal, no murmur, click, rub or gallop GI: soft, non-tender; bowel sounds normal; no masses,  no organomegaly Extremities: extremities normal, atraumatic, no cyanosis or edema  Lab Results:  Results for orders placed or performed during the hospital encounter of 04/26/2015 (from the past 48 hour(s))  Glucose, capillary     Status: None   Collection Time: 04/28/15  9:19 AM  Result Value Ref Range   Glucose-Capillary 67 65 - 99 mg/dL  Glucose, capillary     Status: Abnormal   Collection Time: 04/28/15 11:34 AM  Result Value Ref Range   Glucose-Capillary 102 (H) 65 - 99 mg/dL  Glucose, capillary     Status: None   Collection Time: 04/28/15  4:14 PM  Result Value Ref Range   Glucose-Capillary 75 65 - 99 mg/dL   Comment 1 Notify RN    Comment 2  Document in Chart   Glucose, capillary     Status: Abnormal   Collection Time: 04/28/15  8:26 PM  Result Value Ref Range   Glucose-Capillary 120 (H) 65 - 99 mg/dL  Glucose, capillary     Status: Abnormal   Collection Time: 04/29/15 12:16 AM  Result Value Ref Range   Glucose-Capillary 163 (H) 65 - 99 mg/dL  Glucose, capillary     Status: Abnormal   Collection Time: 04/29/15  4:29 AM  Result Value Ref Range   Glucose-Capillary 102 (H) 65 - 99 mg/dL  Glucose, capillary     Status: Abnormal   Collection Time: 04/29/15  5:33 AM  Result Value Ref Range   Glucose-Capillary 132 (H) 65 - 99 mg/dL  Glucose, capillary     Status: Abnormal   Collection Time: 04/29/15  7:24 AM  Result Value Ref Range   Glucose-Capillary 155 (H) 65 - 99 mg/dL   Comment 1 Notify RN    Comment 2 Document in Chart   Glucose, capillary     Status: Abnormal   Collection Time: 04/29/15 11:47 AM  Result Value Ref Range   Glucose-Capillary 158 (H) 65 - 99 mg/dL   Comment 1 Notify RN    Comment 2 Document in Chart   Glucose, capillary     Status: Abnormal   Collection Time: 04/29/15  4:35 PM  Result Value Ref Range   Glucose-Capillary 172 (  H) 65 - 99 mg/dL   Comment 1 Notify RN    Comment 2 Document in Chart   Glucose, capillary     Status: Abnormal   Collection Time: 04/29/15  8:05 PM  Result Value Ref Range   Glucose-Capillary 184 (H) 65 - 99 mg/dL  Glucose, capillary     Status: Abnormal   Collection Time: 04/30/15 12:39 AM  Result Value Ref Range   Glucose-Capillary 156 (H) 65 - 99 mg/dL  Glucose, capillary     Status: Abnormal   Collection Time: 04/30/15  8:03 AM  Result Value Ref Range   Glucose-Capillary 178 (H) 65 - 99 mg/dL   Comment 1 Notify RN     ABGS  Recent Labs  04/01/2015 1800  TCO2 27   CULTURES Recent Results (from the past 240 hour(s))  Microscopic Examination     Status: Abnormal   Collection Time: 04/26/15  9:28 AM  Result Value Ref Range Status   WBC, UA >30 (A) 0 -  5 /hpf  Final   RBC, UA 0-2 0 -  2 /hpf Final   Epithelial Cells (non renal) 0-10 0 - 10 /hpf Final   Casts Present (A) None seen /lpf Final   Cast Type Hyaline casts N/A Final   Mucus, UA Present Not Estab. Final   Bacteria, UA Few None seen/Few Final  MRSA PCR Screening     Status: None   Collection Time: 04/18/2015  9:26 PM  Result Value Ref Range Status   MRSA by PCR NEGATIVE NEGATIVE Final    Comment:        The GeneXpert MRSA Assay (FDA approved for NASAL specimens only), is one component of a comprehensive MRSA colonization surveillance program. It is not intended to diagnose MRSA infection nor to guide or monitor treatment for MRSA infections.    Studies/Results: US Carotid Bilateral  04/28/2015  CLINICAL DATA:  TIA. EXAM: BILATERAL CAROTID DUPLEX ULTRASOUND TECHNIQUE: Pearline Cables scale imaging, color Doppler and duplex ultrasound were performed of bilateral carotid and vertebral arteries in the neck. COMPARISON:  MRI 04/28/2015. FINDINGS: Criteria: Quantification of carotid stenosis is based on velocity parameters that correlate the residual internal carotid diameter with NASCET-based stenosis levels, using the diameter of the distal internal carotid lumen as the denominator for stenosis measurement. The following velocity measurements were obtained: RIGHT ICA:  195/22 cm/sec CCA:  [id number] cm/sec SYSTOLIC ICA/CCA RATIO:  1.3 DIASTOLIC ICA/CCA RATIO:  3.0 ECA:  248 cm/sec LEFT ICA:  122/22 cm/sec CCA:  [id number] cm/sec SYSTOLIC ICA/CCA RATIO:  1.8 DIASTOLIC ICA/CCA RATIO:  2.8 ECA:  67 cm/sec RIGHT CAROTID ARTERY: Right carotid bifurcation moderate calcified plaque. Degree of stenosis less than 50%. RIGHT VERTEBRAL ARTERY:  Not visualized. LEFT CAROTID ARTERY: Right carotid bifurcation moderate calcified plaque. Degree of stenosis less than 50%. LEFT VERTEBRAL ARTERY:  Patent with antegrade flow. Exam limited by patient movement. IMPRESSION: 1. Moderate bilateral carotid bifurcation calcified plaque. Degree  of stenosis less than 50% bilaterally. 2. Right vertebral not visualized. Left vertebral patent with antegrade flow. Electronically Signed   By: Marcello Moores  Register   On: 04/28/2015 16:42    Medications:  Prior to Admission:  Prescriptions prior to admission  Medication Sig Dispense Refill Last Dose  . allopurinol (ZYLOPRIM) 300 MG tablet Take 150 mg by mouth at bedtime.    04/26/2015 at Unknown time  . aspirin EC 81 MG tablet Take 81 mg by mouth every morning.    04/24/2015 at Unknown time  . atorvastatin (  LIPITOR) 40 MG tablet TAKE 1 TABLET BY MOUTH DAILY. 30 tablet 0 04/14/2015 at Unknown time  . Cholecalciferol (VITAMIN D3) 1000 UNITS CAPS Take 1 capsule by mouth 2 (two) times daily.    04/10/2015 at Unknown time  . ciprofloxacin-fluocinolone PF (OTOVEL) 0.3-0.025 % SOLN Place 0.25 mLs in ear(s) 2 (two) times daily as needed (for drainage).   04/11/2015 at Unknown time  . clopidogrel (PLAVIX) 75 MG tablet TAKE ONE TABLET BY MOUTH ONCE DAILY. 90 tablet 0 04/11/2015 at Unknown time  . furosemide (LASIX) 40 MG tablet Take 1 tablet by mouth 2 (two) times daily.   04/19/2015 at    . gabapentin (NEURONTIN) 300 MG capsule Take 300 mg by mouth 4 (four) times daily as needed (takes three times daily but may take one in between if needed).   03/31/2015 at Unknown time  . glipiZIDE (GLUCOTROL) 10 MG tablet Take 10 mg by mouth daily.    04/26/2015 at Unknown time  . HYDROcodone-acetaminophen (NORCO) 10-325 MG per tablet Take 1 tablet by mouth 4 (four) times daily as needed for pain.   04/22/2015 at Unknown time  . insulin glargine (LANTUS) 100 UNIT/ML injection Inject 60 Units into the skin 2 (two) times daily.    04/07/2015 at Unknown time  . isosorbide dinitrate (ISORDIL) 20 MG tablet Take 1 tablet (20 mg total) by mouth 3 (three) times daily. 90 tablet 10 04/14/2015 at Unknown time  . levothyroxine (SYNTHROID, LEVOTHROID) 150 MCG tablet Take 150 mcg by mouth daily before breakfast.   04/19/2015 at Unknown time  .  mometasone (ELOCON) 0.1 % ointment Apply 1 application topically as directed.    unknown  . nitroGLYCERIN (NITROSTAT) 0.4 MG SL tablet Place 1 tablet (0.4 mg total) under the tongue every 5 (five) minutes as needed for chest pain. 25 tablet 2 unknown  . omeprazole (PRILOSEC) 20 MG capsule Take 20 mg by mouth at bedtime.    04/26/2015 at Unknown time  . [name] (JANUVIA) 100 MG tablet Take 50 mg by mouth 2 (two) times daily.    04/02/2015 at Unknown time  . Tiotropium Bromide Monohydrate (SPIRIVA HANDIHALER IN) Inhale 1 capsule into the lungs every morning.    04/17/2015 at Unknown time  . triamcinolone cream (KENALOG) 0.1 % Apply 1 application topically daily as needed (for irritation).    unknown   Scheduled: . acarbose  100 mg Oral TID WC  . allopurinol  150 mg Oral QHS  . amLODipine  10 mg Oral Daily  . aspirin EC  81 mg Oral q morning - 10a  . atorvastatin  40 mg Oral q1800  . clopidogrel  75 mg Oral Daily  . furosemide  40 mg Oral BID  . glipiZIDE  10 mg Oral QAC breakfast  . heparin  5,000 Units Subcutaneous 3 times per day  . insulin aspart  0-20 Units Subcutaneous 6 times per day  . isosorbide dinitrate  20 mg Oral TID  . levothyroxine  150 mcg Oral QAC breakfast  . linagliptin  5 mg Oral Daily  . pantoprazole  40 mg Oral Daily  . tiotropium  18 mcg Inhalation q morning - 10a   Continuous:  [id number], LORazepam, LORazepam, senna-docusate  Assesment: He was admitted with a TIA. He has multiple other medical problems. He now has what appears to be metabolic encephalopathy possibly related to urinary tract infection. He has been very agitated. With his multiple medical problems and multiple medications I strongly feel that he needs to  be admitted to the hospital and not observation status. He is going to require IV antibiotics. He may require other IV medications including medications for his agitation. He is high risk for self or others injury because of his agitation and  combativeness. I discussed this with his family at length at bedside. I'm also concerned that we may require him to take all of his other medications IV because he may not cooperate to take oral meds. Principal Problem:   TIA (transient ischemic attack) Active Problems:   Chronic diastolic heart failure (HCC)   DM (diabetes mellitus) ([place])   Hypothyroidism   HTN (hypertension)   CAD, CABG '98, cath '08, low risk Myoview Feb 2012, stable cath 12/19/12 with chronically occluded VG to LCX, patent LIMA-LAD & VG to dominant rt.    Obesity   Depression   Sleep apnea, on C-pap   PVD, Lt Vertebral artery PTA in '08, known moderate ICA disease   CKD (chronic kidney disease) stage 3, GFR 30-59 ml/min   Hyperlipidemia   Anxiety    Plan: Admit him to the hospital. Continue other treatments. Haldol as needed. Remain in stepdown.      Manases Etchison L 04/30/2015, 8:32 AM

## 2015-04-30 NOTE — Care Management Important Message (Signed)
Important Message  Patient Details  Name: [name] MRN: [id number] Date of Birth: Feb 28, 1934   Medicare Important Message Given:  Yes    Sherald Barge, RN 04/30/2015, 4:47 PM

## 2015-05-01 LAB — GLUCOSE, CAPILLARY
GLUCOSE-CAPILLARY: 162 mg/dL — AB (ref 65–99)
Glucose-Capillary: 154 mg/dL — ABNORMAL HIGH (ref 65–99)
Glucose-Capillary: 196 mg/dL — ABNORMAL HIGH (ref 65–99)
Glucose-Capillary: 157 mg/dL — ABNORMAL HIGH (ref 65–99)
Glucose-Capillary: 168 mg/dL — ABNORMAL HIGH (ref 65–99)

## 2015-05-01 MED ORDER — ACETAMINOPHEN 650 MG RE SUPP
650.0000 mg | RECTAL | Status: DC | PRN
Start: 2015-05-01 — End: 2015-05-05
  Filled 2015-05-01: qty 1

## 2015-05-01 NOTE — Progress Notes (Signed)
Introduced self to pt and family. Patient slightly restless.

## 2015-05-01 NOTE — Progress Notes (Signed)
Subjective: Sedated and unable to communicate. He received Haldol and Ativan last night according to the nursing staff. No fever.  Objective: Vital signs in last 24 hours: Filed Vitals:   05/01/15 0300 05/01/15 0400 05/01/15 0600 05/01/15 0741  BP: 157/91 161/77 164/75   Pulse: 80 80 84   Temp:    98.6 F (37 C)  TempSrc:    Axillary  Resp: 21 15 23    Height:      Weight:      SpO2: 95% 93% 98%    Weight change:   Intake/Output Summary (Last 24 hours) at 05/01/15 0918 Last data filed at 04/30/15 1657  Gross per 24 hour  Intake      0 ml  Output    200 ml  Net   -200 ml    Physical Exam: Sedated. Pharynx extremely dry. No JVD or thyromegaly. Lungs clear. Heart regular with no murmurs. Abdomen obese and nontender with no palpable organomegaly. Extremities are warm and dry. No edema.  Lab Results:    Results for orders placed or performed during the hospital encounter of 04/23/2015 (from the past 24 hour(s))  Glucose, capillary     Status: Abnormal   Collection Time: 04/30/15 11:31 AM  Result Value Ref Range   Glucose-Capillary 168 (H) 65 - 99 mg/dL   Comment 1 Notify RN   Glucose, capillary     Status: Abnormal   Collection Time: 04/30/15  4:24 PM  Result Value Ref Range   Glucose-Capillary 149 (H) 65 - 99 mg/dL   Comment 1 Notify RN    Comment 2 Document in Chart   Glucose, capillary     Status: Abnormal   Collection Time: 04/30/15  8:12 PM  Result Value Ref Range   Glucose-Capillary 140 (H) 65 - 99 mg/dL   Comment 1 Notify RN    Comment 2 Document in Chart   Glucose, capillary     Status: Abnormal   Collection Time: 04/30/15 11:58 PM  Result Value Ref Range   Glucose-Capillary 148 (H) 65 - 99 mg/dL  Glucose, capillary     Status: Abnormal   Collection Time: 05/01/15  4:00 AM  Result Value Ref Range   Glucose-Capillary 154 (H) 65 - 99 mg/dL     ABGS No results for input(s): PHART, PO2ART, TCO2, HCO3 in the last 72 hours.  Invalid input(s):  PCO2 CULTURES Recent Results (from the past 240 hour(s))  Microscopic Examination     Status: Abnormal   Collection Time: 04/26/15  9:28 AM  Result Value Ref Range Status   WBC, UA >30 (A) 0 -  5 /hpf Final   RBC, UA 0-2 0 -  2 /hpf Final   Epithelial Cells (non renal) 0-10 0 - 10 /hpf Final   Casts Present (A) None seen /lpf Final   Cast Type Hyaline casts N/A Final   Mucus, UA Present Not Estab. Final   Bacteria, UA Few None seen/Few Final  MRSA PCR Screening     Status: None   Collection Time: 04/15/2015  9:26 PM  Result Value Ref Range Status   MRSA by PCR NEGATIVE NEGATIVE Final    Comment:        The GeneXpert MRSA Assay (FDA approved for NASAL specimens only), is one component of a comprehensive MRSA colonization surveillance program. It is not intended to diagnose MRSA infection nor to guide or monitor treatment for MRSA infections.   Culture, Urine     Status: None (Preliminary result)  Collection Time: 04/29/15 10:20 PM  Result Value Ref Range Status   Specimen Description URINE, CLEAN CATCH  Final   Special Requests NONE  Final   Culture PENDING  Incomplete   Report Status PENDING  Incomplete   Studies/Results: No results found. Micro Results: Recent Results (from the past 240 hour(s))  Microscopic Examination     Status: Abnormal   Collection Time: 04/26/15  9:28 AM  Result Value Ref Range Status   WBC, UA >30 (A) 0 -  5 /hpf Final   RBC, UA 0-2 0 -  2 /hpf Final   Epithelial Cells (non renal) 0-10 0 - 10 /hpf Final   Casts Present (A) None seen /lpf Final   Cast Type Hyaline casts N/A Final   Mucus, UA Present Not Estab. Final   Bacteria, UA Few None seen/Few Final  MRSA PCR Screening     Status: None   Collection Time: 04/24/2015  9:26 PM  Result Value Ref Range Status   MRSA by PCR NEGATIVE NEGATIVE Final    Comment:        The GeneXpert MRSA Assay (FDA approved for NASAL specimens only), is one component of a comprehensive MRSA  colonization surveillance program. It is not intended to diagnose MRSA infection nor to guide or monitor treatment for MRSA infections.   Culture, Urine     Status: None (Preliminary result)   Collection Time: 04/29/15 10:20 PM  Result Value Ref Range Status   Specimen Description URINE, CLEAN CATCH  Final   Special Requests NONE  Final   Culture PENDING  Incomplete   Report Status PENDING  Incomplete   Studies/Results: No results found. Medications:  I have reviewed the patient's current medications Scheduled Meds: . acarbose  100 mg Oral TID WC  . allopurinol  150 mg Oral QHS  . amLODipine  10 mg Oral Daily  . aspirin EC  81 mg Oral q morning - 10a  . atorvastatin  40 mg Oral q1800  . clopidogrel  75 mg Oral Daily  . furosemide  40 mg Intravenous Q12H  . heparin  5,000 Units Subcutaneous 3 times per day  . insulin aspart  0-20 Units Subcutaneous 6 times per day  . isosorbide dinitrate  20 mg Oral TID  . levofloxacin (LEVAQUIN) IV  750 mg Intravenous Q48H  . levothyroxine  150 mcg Oral QAC breakfast  . linagliptin  5 mg Oral Daily  . pantoprazole  40 mg Oral Daily  . tiotropium  18 mcg Inhalation q morning - 10a   Continuous Infusions:  PRN Meds:.gabapentin, haloperidol lactate, LORazepam, LORazepam, senna-docusate   Assessment/Plan: #1. TIA. MRI revealed no acute stroke. Continue Plavix and aspirin. History of bilateral cortical infarcts. #2. Acute encephalopathy. He has had recurrent agitation requiring sedation. 3 family members are with him. He does not appear ready for transfer from stepdown. Urine culture negative so far. #3. Sleep apnea. Continue cpap. #4. Diabetes. Principal Problem:   TIA (transient ischemic attack) Active Problems:   Chronic diastolic heart failure (HCC)   DM (diabetes mellitus) ([place])   Hypothyroidism   HTN (hypertension)   CAD, CABG '98, cath '08, low risk Myoview Feb 2012, stable cath 12/19/12 with chronically occluded VG to LCX, patent  LIMA-LAD & VG to dominant rt.    Obesity   Depression   Sleep apnea, on C-pap   PVD, Lt Vertebral artery PTA in '08, known moderate ICA disease   CKD (chronic kidney disease) stage 3, GFR 30-59 ml/min  Hyperlipidemia   Anxiety   Toxic encephalopathy     LOS: 1 day   [name] 05/01/2015, 9:18 AM

## 2015-05-02 LAB — COMPREHENSIVE METABOLIC PANEL
ALT: 25 U/L (ref 17–63)
ANION GAP: 13 (ref 5–15)
AST: 36 U/L (ref 15–41)
Albumin: 3.3 g/dL — ABNORMAL LOW (ref 3.5–5.0)
Alkaline Phosphatase: 118 U/L (ref 38–126)
BUN: 28 mg/dL — ABNORMAL HIGH (ref 6–20)
CALCIUM: 9.1 mg/dL (ref 8.9–10.3)
CHLORIDE: 106 mmol/L (ref 101–111)
CO2: 22 mmol/L (ref 22–32)
CREATININE: 2.25 mg/dL — AB (ref 0.61–1.24)
GFR, EST AFRICAN AMERICAN: 30 mL/min — AB (ref >60)
GFR, EST NON AFRICAN AMERICAN: 26 mL/min — AB (ref >60)
Glucose, Bld: 185 mg/dL — ABNORMAL HIGH (ref 65–99)
Potassium: 4.6 mmol/L (ref 3.5–5.1)
SODIUM: 141 mmol/L (ref 135–145)
Total Bilirubin: 1.7 mg/dL — ABNORMAL HIGH (ref 0.3–1.2)
Total Protein: 7.3 g/dL (ref 6.5–8.1)

## 2015-05-02 LAB — GLUCOSE, CAPILLARY
GLUCOSE-CAPILLARY: 159 mg/dL — AB (ref 65–99)
GLUCOSE-CAPILLARY: 160 mg/dL — AB (ref 65–99)
GLUCOSE-CAPILLARY: 169 mg/dL — AB (ref 65–99)
GLUCOSE-CAPILLARY: 176 mg/dL — AB (ref 65–99)
GLUCOSE-CAPILLARY: 193 mg/dL — AB (ref 65–99)
Glucose-Capillary: 163 mg/dL — ABNORMAL HIGH (ref 65–99)
Glucose-Capillary: 160 mg/dL — ABNORMAL HIGH (ref 65–99)

## 2015-05-02 LAB — CBC WITH DIFFERENTIAL/PLATELET
Basophils Absolute: 0 10*3/uL (ref 0.0–0.1)
Basophils Relative: 0 %
EOS ABS: 0.3 10*3/uL (ref 0.0–0.7)
EOS PCT: 2 %
HCT: 41.5 % (ref 39.0–52.0)
Hemoglobin: 13.5 g/dL (ref 13.0–17.0)
LYMPHS ABS: 1.7 10*3/uL (ref 0.7–4.0)
LYMPHS PCT: 12 %
MCH: 31.3 pg (ref 26.0–34.0)
MCHC: 32.5 g/dL (ref 30.0–36.0)
MCV: 96.1 fL (ref 78.0–100.0)
MONO ABS: 1.5 10*3/uL — AB (ref 0.1–1.0)
MONOS PCT: 10 %
Neutro Abs: 11.4 10*3/uL — ABNORMAL HIGH (ref 1.7–7.7)
Neutrophils Relative %: 76 %
PLATELETS: 264 10*3/uL (ref 150–400)
RBC: 4.32 MIL/uL (ref 4.22–5.81)
RDW: 15.2 % (ref 11.5–15.5)
WBC: 14.9 10*3/uL — AB (ref 4.0–10.5)

## 2015-05-02 LAB — URINE CULTURE

## 2015-05-02 MED ORDER — HYDRALAZINE HCL 20 MG/ML IJ SOLN
10.0000 mg | Freq: Four times a day (QID) | INTRAMUSCULAR | Status: DC | PRN
  Administered 2015-05-02 – 2015-05-03 (×2): 10 mg via INTRAVENOUS
  Filled 2015-05-02 (×2): qty 1

## 2015-05-02 NOTE — Progress Notes (Signed)
Subjective: [name] had a better night last night but did require a dose of Haldol. No fever.  Objective: Vital signs in last 24 hours: Filed Vitals:   05/02/15 0500 05/02/15 0600 05/02/15 0740 05/02/15 0824  BP: 124/90 146/76    Pulse: 100 96    Temp:   98.5 F (36.9 C)   TempSrc:   Axillary   Resp: 26 24    Height:      Weight: 250 lb 7.1 oz (113.6 kg)     SpO2: 96% 94%  96%   Weight change:   Intake/Output Summary (Last 24 hours) at 05/02/15 0946 Last data filed at 05/02/15 0500  Gross per 24 hour  Intake      0 ml  Output    500 ml  Net   -500 ml    Physical Exam: [name] opens his eyes but continued to have difficulty speaking clearly. [name] appears more calm. Lungs clear. Heart regular with no murmurs. Blood pressure has been variable. [name] is not hypotensive. Abdomen is soft and nontender. Extremities are warm and dry with no edema. No focal weakness noted.  Lab Results:    Results for orders placed or performed during the hospital encounter of 04/06/2015 (from the past 24 hour(s))  Glucose, capillary     Status: Abnormal   Collection Time: 05/01/15 12:04 PM  Result Value Ref Range   Glucose-Capillary 196 (H) 65 - 99 mg/dL  Glucose, capillary     Status: Abnormal   Collection Time: 05/01/15  4:02 PM  Result Value Ref Range   Glucose-Capillary 157 (H) 65 - 99 mg/dL  Glucose, capillary     Status: Abnormal   Collection Time: 05/01/15  7:37 PM  Result Value Ref Range   Glucose-Capillary 168 (H) 65 - 99 mg/dL  Glucose, capillary     Status: Abnormal   Collection Time: 05/02/15 12:31 AM  Result Value Ref Range   Glucose-Capillary 159 (H) 65 - 99 mg/dL   Comment 1 Notify RN   CBC with Differential/Platelet     Status: Abnormal   Collection Time: 05/02/15  5:01 AM  Result Value Ref Range   WBC 14.9 (H) 4.0 - 10.5 K/uL   RBC 4.32 4.22 - 5.81 MIL/uL   Hemoglobin 13.5 13.0 - 17.0 g/dL   HCT 41.5 39.0 - 52.0 %   MCV 96.1 78.0 - 100.0 fL   MCH 31.3 26.0 - 34.0 pg   MCHC  32.5 30.0 - 36.0 g/dL   RDW 15.2 11.5 - 15.5 %   Platelets 264 150 - 400 K/uL   Neutrophils Relative % 76 %   Neutro Abs 11.4 (H) 1.7 - 7.7 K/uL   Lymphocytes Relative 12 %   Lymphs Abs 1.7 0.7 - 4.0 K/uL   Monocytes Relative 10 %   Monocytes Absolute 1.5 (H) 0.1 - 1.0 K/uL   Eosinophils Relative 2 %   Eosinophils Absolute 0.3 0.0 - 0.7 K/uL   Basophils Relative 0 %   Basophils Absolute 0.0 0.0 - 0.1 K/uL  Comprehensive metabolic panel     Status: Abnormal   Collection Time: 05/02/15  5:01 AM  Result Value Ref Range   Sodium 141 135 - 145 mmol/L   Potassium 4.6 3.5 - 5.1 mmol/L   Chloride 106 101 - 111 mmol/L   CO2 22 22 - 32 mmol/L   Glucose, Bld 185 (H) 65 - 99 mg/dL   BUN 28 (H) 6 - 20 mg/dL   Creatinine, Ser 2.25 (  H) 0.61 - 1.24 mg/dL   Calcium 9.1 8.9 - 10.3 mg/dL   Total Protein 7.3 6.5 - 8.1 g/dL   Albumin 3.3 (L) 3.5 - 5.0 g/dL   AST 36 15 - 41 U/L   ALT 25 17 - 63 U/L   Alkaline Phosphatase 118 38 - 126 U/L   Total Bilirubin 1.7 (H) 0.3 - 1.2 mg/dL   GFR calc non Af Amer 26 (L) >60 mL/min   GFR calc Af Amer 30 (L) >60 mL/min   Anion gap 13 5 - 15  Glucose, capillary     Status: Abnormal   Collection Time: 05/02/15  5:05 AM  Result Value Ref Range   Glucose-Capillary 163 (H) 65 - 99 mg/dL   Comment 1 Notify RN   Glucose, capillary     Status: Abnormal   Collection Time: 05/02/15  7:39 AM  Result Value Ref Range   Glucose-Capillary 160 (H) 65 - 99 mg/dL   Comment 1 Notify RN    Comment 2 Document in Chart      ABGS No results for input(s): PHART, PO2ART, TCO2, HCO3 in the last 72 hours.  Invalid input(s): PCO2 CULTURES Recent Results (from the past 240 hour(s))  Microscopic Examination     Status: Abnormal   Collection Time: 04/26/15  9:28 AM  Result Value Ref Range Status   WBC, UA >30 (A) 0 -  5 /hpf Final   RBC, UA 0-2 0 -  2 /hpf Final   Epithelial Cells (non renal) 0-10 0 - 10 /hpf Final   Casts Present (A) None seen /lpf Final   Cast Type  Hyaline casts N/A Final   Mucus, UA Present Not Estab. Final   Bacteria, UA Few None seen/Few Final  MRSA PCR Screening     Status: None   Collection Time: 04/06/2015  9:26 PM  Result Value Ref Range Status   MRSA by PCR NEGATIVE NEGATIVE Final    Comment:        The GeneXpert MRSA Assay (FDA approved for NASAL specimens only), is one component of a comprehensive MRSA colonization surveillance program. It is not intended to diagnose MRSA infection nor to guide or monitor treatment for MRSA infections.   Culture, Urine     Status: None (Preliminary result)   Collection Time: 04/29/15 10:20 PM  Result Value Ref Range Status   Specimen Description URINE, CLEAN CATCH  Final   Special Requests NONE  Final   Culture   Final    TOO YOUNG TO READ Performed at [facility]    Report Status PENDING  Incomplete   Studies/Results: No results found. Micro Results: Recent Results (from the past 240 hour(s))  Microscopic Examination     Status: Abnormal   Collection Time: 04/26/15  9:28 AM  Result Value Ref Range Status   WBC, UA >30 (A) 0 -  5 /hpf Final   RBC, UA 0-2 0 -  2 /hpf Final   Epithelial Cells (non renal) 0-10 0 - 10 /hpf Final   Casts Present (A) None seen /lpf Final   Cast Type Hyaline casts N/A Final   Mucus, UA Present Not Estab. Final   Bacteria, UA Few None seen/Few Final  MRSA PCR Screening     Status: None   Collection Time: 04/19/2015  9:26 PM  Result Value Ref Range Status   MRSA by PCR NEGATIVE NEGATIVE Final    Comment:        The GeneXpert MRSA  Assay (FDA approved for NASAL specimens only), is one component of a comprehensive MRSA colonization surveillance program. It is not intended to diagnose MRSA infection nor to guide or monitor treatment for MRSA infections.   Culture, Urine     Status: None (Preliminary result)   Collection Time: 04/29/15 10:20 PM  Result Value Ref Range Status   Specimen Description URINE, CLEAN CATCH  Final   Special  Requests NONE  Final   Culture   Final    TOO YOUNG TO READ Performed at [facility]    Report Status PENDING  Incomplete   Studies/Results: No results found. Medications:  I have reviewed the patient's current medications Scheduled Meds: . acarbose  100 mg Oral TID WC  . allopurinol  150 mg Oral QHS  . amLODipine  10 mg Oral Daily  . aspirin EC  81 mg Oral q morning - 10a  . atorvastatin  40 mg Oral q1800  . clopidogrel  75 mg Oral Daily  . furosemide  40 mg Intravenous Q12H  . heparin  5,000 Units Subcutaneous 3 times per day  . insulin aspart  0-20 Units Subcutaneous 6 times per day  . isosorbide dinitrate  20 mg Oral TID  . levofloxacin (LEVAQUIN) IV  750 mg Intravenous Q48H  . levothyroxine  150 mcg Oral QAC breakfast  . linagliptin  5 mg Oral Daily  . pantoprazole  40 mg Oral Daily  . tiotropium  18 mcg Inhalation q morning - 10a   Continuous Infusions:  PRN Meds:.acetaminophen, gabapentin, haloperidol lactate, LORazepam, LORazepam, senna-docusate   Assessment/Plan: #1. TIA. No evidence of acute stroke by imaging. #2. Acute encephalopathy. Improved today. Urine culture remains pending. White count 14.9. [name] continues levofloxacin. #3. Diabetes. Glucose 185. #4. Chronic kidney disease stage III. BUN and creatinine are 28 and 2.25.  [name] will need to remain in stepdown. Will hope to minimize Haldol use. Will try to avoid Ativan. Principal Problem:   TIA (transient ischemic attack) Active Problems:   Chronic diastolic heart failure (HCC)   DM (diabetes mellitus) ([place])   Hypothyroidism   HTN (hypertension)   CAD, CABG '98, cath '08, low risk Myoview Feb 2012, stable cath 12/19/12 with chronically occluded VG to LCX, patent LIMA-LAD & VG to dominant rt.    Obesity   Depression   Sleep apnea, on C-pap   PVD, Lt Vertebral artery PTA in '08, known moderate ICA disease   CKD (chronic kidney disease) stage 3, GFR 30-59 ml/min   Hyperlipidemia   Anxiety   Toxic  encephalopathy     LOS: 2 days   Birdie Fetty 05/02/2015, 9:46 AM

## 2015-05-02 NOTE — Progress Notes (Signed)
Daughter [name] expressed her concern of allowing patient to grow in restlessness. Per daughter I think that would be stupid to stimulate him at night when he hasn't slept in 3 days.  RN expressed that is not the intent, medication was not due yet, mbr was not at a level thought to need asking for additional beyond what was ordered.  Haldol repeated at 0015 per order when mbr was fighting to remove safety mitts and RR elevated to 36-39.  Patient resting at present. Dtr resting. No further concerns.

## 2015-05-02 NOTE — Progress Notes (Signed)
Patient is drowsy, and difficult to arouse. No PO medications were given. Patient blood pressures have been trending high. Called Dr. Willey Blade to inform. New order received and carried out.

## 2015-05-03 LAB — GLUCOSE, CAPILLARY
GLUCOSE-CAPILLARY: 161 mg/dL — AB (ref 65–99)
GLUCOSE-CAPILLARY: 184 mg/dL — AB (ref 65–99)
GLUCOSE-CAPILLARY: 184 mg/dL — AB (ref 65–99)
Glucose-Capillary: 201 mg/dL — ABNORMAL HIGH (ref 65–99)
Glucose-Capillary: 189 mg/dL — ABNORMAL HIGH (ref 65–99)

## 2015-05-03 NOTE — Progress Notes (Signed)
E-link contacted in reference to pt's wife and daughter wanted to change code status. MD at e-link has put in order for new DNR status. Pt is nonverbal at this time.Family verbalizes understanding.

## 2015-05-03 NOTE — Progress Notes (Signed)
Patient ID: [name], male   DOB: 30-Sep-1933, 82 y.o.   MRN: [id number]   [name] McDowell's cardiology notes reviewed from last week. He was noted to have intermittent [place] and 2:1 AV block early on admission, further monitoring including telemetry review this morning shows no recurrent bradycardia. No further cardiac workup at this time, he will need follow up with his primary cardiologist [name] at discharge. We will sign off, please call if recurrent issues.   Zandra Abts MD

## 2015-05-03 NOTE — Progress Notes (Signed)
Quick Note:  I would plan on LFTs and TSH in 4 weeks. OV in 4-6 weeks to pick up where we left off.  He is being treated for UTI while inpatient. ______

## 2015-05-03 NOTE — Progress Notes (Signed)
Subjective: He's a little more arousable this morning. He has not had any sedation for about 36 hours. He says he is hungry. His urine culture is noted with what looks like diphtheroids but I'm going to leave him on the antibiotic for the moment because he does seem to be doing a little better  Objective: Vital signs in last 24 hours: Temp:  [97.5 F (36.4 C)-99.4 F (37.4 C)] 97.5 F (36.4 C) (03/06 0400) Pulse Rate:  [64-108] 98 (03/06 0500) Resp:  [18-40] 26 (03/06 0500) BP: (80-189)/(54-131) 180/64 mmHg (03/06 0500) SpO2:  [92 %-97 %] 95 % (03/06 0500) Weight:  [112 kg (246 lb 14.6 oz)] 112 kg (246 lb 14.6 oz) (03/06 0500) Weight change: -1.6 kg (-3 lb 8.4 oz) Last BM Date: 04/30/15  Intake/Output from previous day: 03/05 0701 - 03/06 0700 In: 300 [IV Piggyback:300] Out: 1100 [Urine:1100]  PHYSICAL EXAM General appearance: He is more awake and able to answer questions. Resp: rhonchi bilaterally Cardio: regular rate and rhythm, S1, S2 normal, no murmur, click, rub or gallop GI: soft, non-tender; bowel sounds normal; no masses,  no organomegaly Extremities: extremities normal, atraumatic, no cyanosis or edema  Lab Results:  Results for orders placed or performed during the hospital encounter of 04/15/2015 (from the past 48 hour(s))  Glucose, capillary     Status: Abnormal   Collection Time: 05/01/15  9:32 AM  Result Value Ref Range   Glucose-Capillary 162 (H) 65 - 99 mg/dL  Glucose, capillary     Status: Abnormal   Collection Time: 05/01/15 12:04 PM  Result Value Ref Range   Glucose-Capillary 196 (H) 65 - 99 mg/dL  Glucose, capillary     Status: Abnormal   Collection Time: 05/01/15  4:02 PM  Result Value Ref Range   Glucose-Capillary 157 (H) 65 - 99 mg/dL  Glucose, capillary     Status: Abnormal   Collection Time: 05/01/15  7:37 PM  Result Value Ref Range   Glucose-Capillary 168 (H) 65 - 99 mg/dL  Glucose, capillary     Status: Abnormal   Collection Time: 05/02/15  12:31 AM  Result Value Ref Range   Glucose-Capillary 159 (H) 65 - 99 mg/dL   Comment 1 Notify RN   CBC with Differential/Platelet     Status: Abnormal   Collection Time: 05/02/15  5:01 AM  Result Value Ref Range   WBC 14.9 (H) 4.0 - 10.5 K/uL   RBC 4.32 4.22 - 5.81 MIL/uL   Hemoglobin 13.5 13.0 - 17.0 g/dL   HCT 41.5 39.0 - 52.0 %   MCV 96.1 78.0 - 100.0 fL   MCH 31.3 26.0 - 34.0 pg   MCHC 32.5 30.0 - 36.0 g/dL   RDW 15.2 11.5 - 15.5 %   Platelets 264 150 - 400 K/uL   Neutrophils Relative % 76 %   Neutro Abs 11.4 (H) 1.7 - 7.7 K/uL   Lymphocytes Relative 12 %   Lymphs Abs 1.7 0.7 - 4.0 K/uL   Monocytes Relative 10 %   Monocytes Absolute 1.5 (H) 0.1 - 1.0 K/uL   Eosinophils Relative 2 %   Eosinophils Absolute 0.3 0.0 - 0.7 K/uL   Basophils Relative 0 %   Basophils Absolute 0.0 0.0 - 0.1 K/uL  Comprehensive metabolic panel     Status: Abnormal   Collection Time: 05/02/15  5:01 AM  Result Value Ref Range   Sodium 141 135 - 145 mmol/L   Potassium 4.6 3.5 - 5.1 mmol/L   Chloride 106  101 - 111 mmol/L   CO2 22 22 - 32 mmol/L   Glucose, Bld 185 (H) 65 - 99 mg/dL   BUN 28 (H) 6 - 20 mg/dL   Creatinine, Ser 2.25 (H) 0.61 - 1.24 mg/dL   Calcium 9.1 8.9 - 10.3 mg/dL   Total Protein 7.3 6.5 - 8.1 g/dL   Albumin 3.3 (L) 3.5 - 5.0 g/dL   AST 36 15 - 41 U/L   ALT 25 17 - 63 U/L   Alkaline Phosphatase 118 38 - 126 U/L   Total Bilirubin 1.7 (H) 0.3 - 1.2 mg/dL   GFR calc non Af Amer 26 (L) >60 mL/min   GFR calc Af Amer 30 (L) >60 mL/min    Comment: (NOTE) The eGFR has been calculated using the CKD EPI equation. This calculation has not been validated in all clinical situations. eGFR's persistently <60 mL/min signify possible Chronic Kidney Disease.    Anion gap 13 5 - 15  Glucose, capillary     Status: Abnormal   Collection Time: 05/02/15  5:05 AM  Result Value Ref Range   Glucose-Capillary 163 (H) 65 - 99 mg/dL   Comment 1 Notify RN   Glucose, capillary     Status: Abnormal    Collection Time: 05/02/15  7:39 AM  Result Value Ref Range   Glucose-Capillary 160 (H) 65 - 99 mg/dL   Comment 1 Notify RN    Comment 2 Document in Chart   Glucose, capillary     Status: Abnormal   Collection Time: 05/02/15 11:50 AM  Result Value Ref Range   Glucose-Capillary 169 (H) 65 - 99 mg/dL   Comment 1 Notify RN    Comment 2 Document in Chart   Glucose, capillary     Status: Abnormal   Collection Time: 05/02/15  3:49 PM  Result Value Ref Range   Glucose-Capillary 176 (H) 65 - 99 mg/dL  Glucose, capillary     Status: Abnormal   Collection Time: 05/02/15  7:33 PM  Result Value Ref Range   Glucose-Capillary 193 (H) 65 - 99 mg/dL   Comment 1 Notify RN    Comment 2 Document in Chart   Glucose, capillary     Status: Abnormal   Collection Time: 05/02/15 11:46 PM  Result Value Ref Range   Glucose-Capillary 160 (H) 65 - 99 mg/dL   Comment 1 Notify RN   Glucose, capillary     Status: Abnormal   Collection Time: 05/03/15  4:33 AM  Result Value Ref Range   Glucose-Capillary 161 (H) 65 - 99 mg/dL   Comment 1 Notify RN     ABGS No results for input(s): PHART, PO2ART, TCO2, HCO3 in the last 72 hours.  Invalid input(s): PCO2 CULTURES Recent Results (from the past 240 hour(s))  Microscopic Examination     Status: Abnormal   Collection Time: 04/26/15  9:28 AM  Result Value Ref Range Status   WBC, UA >30 (A) 0 -  5 /hpf Final   RBC, UA 0-2 0 -  2 /hpf Final   Epithelial Cells (non renal) 0-10 0 - 10 /hpf Final   Casts Present (A) None seen /lpf Final   Cast Type Hyaline casts N/A Final   Mucus, UA Present Not Estab. Final   Bacteria, UA Few None seen/Few Final  MRSA PCR Screening     Status: None   Collection Time: 04/26/2015  9:26 PM  Result Value Ref Range Status   MRSA by PCR NEGATIVE NEGATIVE Final  Comment:        The GeneXpert MRSA Assay (FDA approved for NASAL specimens only), is one component of a comprehensive MRSA colonization surveillance program. It is  not intended to diagnose MRSA infection nor to guide or monitor treatment for MRSA infections.   Culture, Urine     Status: None   Collection Time: 04/29/15 10:20 PM  Result Value Ref Range Status   Specimen Description URINE, CLEAN CATCH  Final   Special Requests NONE  Final   Culture   Final    30,000 COLONIES/mL DIPHTHEROIDS(CORYNEBACTERIUM SPECIES) Standardized susceptibility testing for this organism is not available. Performed at [facility]    Report Status 05/02/2015 FINAL  Final   Studies/Results: No results found.  Medications:  Prior to Admission:  Prescriptions prior to admission  Medication Sig Dispense Refill Last Dose  . allopurinol (ZYLOPRIM) 300 MG tablet Take 150 mg by mouth at bedtime.    04/26/2015 at Unknown time  . aspirin EC 81 MG tablet Take 81 mg by mouth every morning.    04/23/2015 at Unknown time  . atorvastatin (LIPITOR) 40 MG tablet TAKE 1 TABLET BY MOUTH DAILY. 30 tablet 0 03/31/2015 at Unknown time  . Cholecalciferol (VITAMIN D3) 1000 UNITS CAPS Take 1 capsule by mouth 2 (two) times daily.    04/16/2015 at Unknown time  . ciprofloxacin-fluocinolone PF (OTOVEL) 0.3-0.025 % SOLN Place 0.25 mLs in ear(s) 2 (two) times daily as needed (for drainage).   04/18/2015 at Unknown time  . clopidogrel (PLAVIX) 75 MG tablet TAKE ONE TABLET BY MOUTH ONCE DAILY. 90 tablet 0 04/19/2015 at Unknown time  . furosemide (LASIX) 40 MG tablet Take 1 tablet by mouth 2 (two) times daily.   04/05/2015 at    . gabapentin (NEURONTIN) 300 MG capsule Take 300 mg by mouth 4 (four) times daily as needed (takes three times daily but may take one in between if needed).   04/23/2015 at Unknown time  . glipiZIDE (GLUCOTROL) 10 MG tablet Take 10 mg by mouth daily.    04/13/2015 at Unknown time  . HYDROcodone-acetaminophen (NORCO) 10-325 MG per tablet Take 1 tablet by mouth 4 (four) times daily as needed for pain.   04/24/2015 at Unknown time  . insulin glargine (LANTUS) 100 UNIT/ML  injection Inject 60 Units into the skin 2 (two) times daily.    04/22/2015 at Unknown time  . isosorbide dinitrate (ISORDIL) 20 MG tablet Take 1 tablet (20 mg total) by mouth 3 (three) times daily. 90 tablet 10 04/14/2015 at Unknown time  . levothyroxine (SYNTHROID, LEVOTHROID) 150 MCG tablet Take 150 mcg by mouth daily before breakfast.   04/16/2015 at Unknown time  . mometasone (ELOCON) 0.1 % ointment Apply 1 application topically as directed.    unknown  . nitroGLYCERIN (NITROSTAT) 0.4 MG SL tablet Place 1 tablet (0.4 mg total) under the tongue every 5 (five) minutes as needed for chest pain. 25 tablet 2 unknown  . omeprazole (PRILOSEC) 20 MG capsule Take 20 mg by mouth at bedtime.    04/26/2015 at Unknown time  . [name] (JANUVIA) 100 MG tablet Take 50 mg by mouth 2 (two) times daily.    04/08/2015 at Unknown time  . Tiotropium Bromide Monohydrate (SPIRIVA HANDIHALER IN) Inhale 1 capsule into the lungs every morning.    04/14/2015 at Unknown time  . triamcinolone cream (KENALOG) 0.1 % Apply 1 application topically daily as needed (for irritation).    unknown   Scheduled: . acarbose  100  mg Oral TID WC  . allopurinol  150 mg Oral QHS  . amLODipine  10 mg Oral Daily  . aspirin EC  81 mg Oral q morning - 10a  . atorvastatin  40 mg Oral q1800  . clopidogrel  75 mg Oral Daily  . furosemide  40 mg Intravenous Q12H  . heparin  5,000 Units Subcutaneous 3 times per day  . insulin aspart  0-20 Units Subcutaneous 6 times per day  . isosorbide dinitrate  20 mg Oral TID  . levofloxacin (LEVAQUIN) IV  750 mg Intravenous Q48H  . levothyroxine  150 mcg Oral QAC breakfast  . linagliptin  5 mg Oral Daily  . pantoprazole  40 mg Oral Daily  . tiotropium  18 mcg Inhalation q morning - 10a   Continuous:  DGL:[id number], gabapentin, haloperidol lactate, hydrALAZINE, LORazepam, LORazepam, senna-docusate  Assesment: He was admitted with a TIA. Since then he's had toxic encephalopathy possibly related to  urinary tract infection or perhaps some mild baseline dementia with worsening since she's been in the hospital  He has chronic diastolic heart failure and that seems to be well controlled at this point.  He had a questionable urinary tract infection he's been on Levaquin and he is improving so I'm going to continue the Levaquin at least for right now.  He has diabetes at baseline is also pretty well controlled  He has severe coronary and peripheral arterial disease which is stable.  He has sleep apnea he's been on CPAP he's using it here.  He has been very agitated but has not required any sedatives in the last 36 hours or so   Principal Problem:   TIA (transient ischemic attack) Active Problems:   Chronic diastolic heart failure (HCC)   DM (diabetes mellitus) ([place])   Hypothyroidism   HTN (hypertension)   CAD, CABG '98, cath '08, low risk Myoview Feb 2012, stable cath 12/19/12 with chronically occluded VG to LCX, patent LIMA-LAD & VG to dominant rt.    Obesity   Depression   Sleep apnea, on C-pap   PVD, Lt Vertebral artery PTA in '08, known moderate ICA disease   CKD (chronic kidney disease) stage 3, GFR 30-59 ml/min   Hyperlipidemia   Anxiety   Toxic encephalopathy    Plan: Continue current treatments including the antibiotic. I think we can probably have him undergo evaluation by speech, PT OT etc. now because I think today he'll probably be able to cooperate    LOS: 3 days   Cordera Stineman L 05/03/2015, 7:50 AM

## 2015-05-03 NOTE — Progress Notes (Addendum)
[facility] Progress Note Patient Name: [name] DOB: 1933/05/27 MRN: [id number]   Date of Service  05/03/2015  HPI/Events of Note  Discussion of goals of care with family at bedside via camera.Patient's wife [name] & daughter [name] at bedside. Both report that the patient had expressed a wish for a DO NOT RESUSCITATE/DO NOT INTUBATE status. Patient is not able to converse with me at this time. Wife indicated that daughter is healthcare power of attorney.  eICU Interventions  Changing CODE STATUS to DO NOT RESUSCITATE/DO NOT INTUBATE at family's request.     Intervention Category Major Interventions: Other:  Tera Partridge 05/03/2015, 7:27 PM

## 2015-05-04 ENCOUNTER — Encounter (HOSPITAL_COMMUNITY): Payer: Self-pay | Admitting: Primary Care

## 2015-05-04 ENCOUNTER — Encounter: Payer: Self-pay | Admitting: Internal Medicine

## 2015-05-04 ENCOUNTER — Other Ambulatory Visit: Payer: Self-pay | Admitting: Gastroenterology

## 2015-05-04 DIAGNOSIS — R630 Anorexia: Secondary | ICD-10-CM

## 2015-05-04 DIAGNOSIS — I639 Cerebral infarction, unspecified: Secondary | ICD-10-CM | POA: Insufficient documentation

## 2015-05-04 DIAGNOSIS — Z515 Encounter for palliative care: Secondary | ICD-10-CM | POA: Insufficient documentation

## 2015-05-04 DIAGNOSIS — R634 Abnormal weight loss: Secondary | ICD-10-CM

## 2015-05-04 DIAGNOSIS — Z7189 Other specified counseling: Secondary | ICD-10-CM

## 2015-05-04 DIAGNOSIS — R1030 Lower abdominal pain, unspecified: Secondary | ICD-10-CM

## 2015-05-04 LAB — CBC WITH DIFFERENTIAL/PLATELET
BASOS ABS: 0 10*3/uL (ref 0.0–0.1)
BASOS PCT: 0 %
EOS ABS: 0.1 10*3/uL (ref 0.0–0.7)
EOS PCT: 0 %
HEMATOCRIT: 43.7 % (ref 39.0–52.0)
Hemoglobin: 14.4 g/dL (ref 13.0–17.0)
Lymphocytes Relative: 9 %
Lymphs Abs: 2.1 10*3/uL (ref 0.7–4.0)
MCH: 32.3 pg (ref 26.0–34.0)
MCHC: 33 g/dL (ref 30.0–36.0)
MCV: 98 fL (ref 78.0–100.0)
MONO ABS: 1.5 10*3/uL — AB (ref 0.1–1.0)
Monocytes Relative: 6 %
NEUTROS ABS: 19.8 10*3/uL — AB (ref 1.7–7.7)
Neutrophils Relative %: 84 %
PLATELETS: 253 10*3/uL (ref 150–400)
RBC: 4.46 MIL/uL (ref 4.22–5.81)
RDW: 16 % — AB (ref 11.5–15.5)
WBC: 23.5 10*3/uL — ABNORMAL HIGH (ref 4.0–10.5)

## 2015-05-04 LAB — BASIC METABOLIC PANEL
ANION GAP: 13 (ref 5–15)
BUN: 56 mg/dL — ABNORMAL HIGH (ref 6–20)
CALCIUM: 9.4 mg/dL (ref 8.9–10.3)
CO2: 25 mmol/L (ref 22–32)
CREATININE: 3.64 mg/dL — AB (ref 0.61–1.24)
Chloride: 109 mmol/L (ref 101–111)
GFR calc Af Amer: 17 mL/min — ABNORMAL LOW (ref >60)
GFR, EST NON AFRICAN AMERICAN: 15 mL/min — AB (ref >60)
GLUCOSE: 200 mg/dL — AB (ref 65–99)
Potassium: 5 mmol/L (ref 3.5–5.1)
Sodium: 147 mmol/L — ABNORMAL HIGH (ref 135–145)

## 2015-05-04 LAB — GLUCOSE, CAPILLARY
GLUCOSE-CAPILLARY: 193 mg/dL — AB (ref 65–99)
GLUCOSE-CAPILLARY: 196 mg/dL — AB (ref 65–99)
Glucose-Capillary: 196 mg/dL — ABNORMAL HIGH (ref 65–99)

## 2015-05-04 MED ORDER — LORAZEPAM 1 MG PO TABS
1.0000 mg | ORAL_TABLET | Freq: Two times a day (BID) | ORAL | Status: DC
  Administered 2015-05-04 (×2): 1 mg via ORAL
  Filled 2015-05-04 (×2): qty 1

## 2015-05-04 MED ORDER — MORPHINE SULFATE (PF) 4 MG/ML IV SOLN
4.0000 mg | INTRAVENOUS | Status: DC | PRN
  Administered 2015-05-04: 4 mg via INTRAVENOUS
  Filled 2015-05-04: qty 1

## 2015-05-04 MED ORDER — ATROPINE SULFATE 1 % OP SOLN
2.0000 [drp] | Freq: Four times a day (QID) | OPHTHALMIC | Status: DC
  Administered 2015-05-04 (×2): 2 [drp] via SUBLINGUAL
  Filled 2015-05-04: qty 2

## 2015-05-04 MED ORDER — MORPHINE SULFATE (CONCENTRATE) 10 MG/0.5ML PO SOLN
5.0000 mg | ORAL | Status: DC
  Administered 2015-05-04 – 2015-05-05 (×4): 5 mg via SUBLINGUAL
  Filled 2015-05-04 (×4): qty 0.5

## 2015-05-04 MED ORDER — MORPHINE SULFATE (CONCENTRATE) 10 MG/0.5ML PO SOLN
2.5000 mg | ORAL | Status: DC | PRN
  Administered 2015-05-04 – 2015-05-05 (×3): 2.6 mg via SUBLINGUAL
  Filled 2015-05-04 (×3): qty 0.5

## 2015-05-04 NOTE — Consult Note (Signed)
Consultation Note Date: 05/04/2015   Patient Name: [name]  DOB: Sep 19, 1933  MRN: [id number]  Age / Sex: 82 y.o., male  PCP: Sinda Du, MD Referring Physician: Sinda Du, MD  Reason for Consultation: Establishing goals of care, Non pain symptom management, Pain control, Psychosocial/spiritual support, Terminal Care and Withdrawal of life-sustaining treatment    Clinical Assessment/Narrative: [name] is lying in bed with his family at his bedside. He does have some work of breathing noted, and an increased heart rate into the 110's. Family including wife [name], daughter [name], and son-in-law [name] meet with me in my office. They share with me their desire for [name] to be made comfortable. They share his recent struggles with his health.   We talk about comfort measures including scheduled morphine and as needed morphine for pain and symptom management. We talk about scheduled anti-anxiety medications. The family shares that they would like for him to be disconnected from some of the IVs and lines. We talk about transfer to a larger room on the 3rd floor. [name] tells the story of how her father became combative on Wednesday night and medications were unable to relieve his suffering. She shares that today he was given Ativan IV and was able to relax and rest. We talk about atropine sublingual to help with his secretions. We also discuss CPAP and they share that this had been "bothering him" and he was "grabbing at it".  Family desire is that he not be placed on CPAP if his breathing more to worsen.   [name] asks about prognosis and [name] agrees that she would like to hear this.  I share with them that due to his current health condition, we expect "hours to days", but that only God knows. Both [name] and [place] request to be called if [name] is near death and they are not at his  bedside. We discuss the changes that occur as people near death, and that some people wait until others leave to pass. This family seems to have a strong faith and acceptance of [name] end-of-life. [name] shares that March is a special month for her father and she feels that he may be buried on her birthday.      Contacts/Participants in [place], daughter [name], and son-in-law [name]  Primary Decision Maker: wife [name] and daughter [name]     Relationship to Patient as above HCPOA: yes  daughter [name].  SUMMARY OF RECOMMENDATIONS  Code Status/Advance Care Planning: DNR- full comfort care at this time.    Code Status Orders        Start     Ordered   05/03/15 1929  Do not attempt resuscitation (DNR)   Continuous    Question Answer Comment  In the event of cardiac or respiratory ARREST Do not call a "code blue"   In the event of cardiac or respiratory ARREST Do not perform Intubation, CPR, defibrillation or ACLS   In the event of cardiac or respiratory ARREST Use medication by any route, position, wound care, and other measures to relive pain and suffering. May use oxygen, suction and manual treatment of airway obstruction as needed for comfort.      05/03/15 1928    Code Status History    Date Active Date Inactive Code Status Order ID Comments User Context   04/06/2015  9:35 PM 05/03/2015  7:28 PM Full Code [id number]  Orvan Falconer, MD Inpatient   12/17/2012  5:27 PM 12/18/2012  5:49  PM Full Code [id number]  Geradine Girt, DO Inpatient   06/09/2011  9:01 AM 06/12/2011  5:15 PM Full Code [id number]  Joanna Hews, RN Inpatient   06/01/2011  1:56 AM 06/04/2011  6:05 PM Full Code [id number]  Aldean Ast, RN Inpatient      Other Directives:None  Symptom Management:   morphine 5 mg sublingual every 4 hours scheduled.  Morphine 2.5 mg sublingual every one hour as needed, for respiratory rate of 20 or greater.  Ativan 1 mg sublingual twice per  day.  Palliative Prophylaxis:   Frequent Pain Assessment, Oral Care and Turn Reposition  Additional Recommendations (Limitations, Scope, Preferences):  Full Comfort Care  Psycho-social/Spiritual:  Support System: Strong Desire for further Chaplaincy support: ongoing Additional Recommendations: Grief/Bereavement Support  Prognosis: Hours - Days  Discharge Planning: Anticipated Hospital Death   Chief Complaint/ Primary Diagnoses: Present on Admission:  . TIA (transient ischemic attack) . CAD, CABG '98, cath '08, low risk Myoview Feb 2012, stable cath 12/19/12 with chronically occluded VG to LCX, patent LIMA-LAD & VG to dominant rt.  . Chronic diastolic heart failure ([place]) . CKD (chronic kidney disease) stage 3, GFR 30-59 ml/min . Depression . HTN (hypertension) . Hyperlipidemia . Obesity . Hypothyroidism . Sleep apnea, on C-pap . PVD, Lt Vertebral artery PTA in '08, known moderate ICA disease . Toxic encephalopathy  I have reviewed the medical record, interviewed the patient and family, and examined the patient. The following aspects are pertinent.  Past Medical History  Diagnosis Date  . Diabetes mellitus type II   . Hypothyroidism   . Essential hypertension   . GERD (gastroesophageal reflux disease)   . Chronic diastolic heart failure ([place])   . Heart attack ([place])   . COPD (chronic obstructive pulmonary disease) ([place])   . Coronary artery disease     CABG 1998, cath 2014 - patent LIMA-LAD, occluded SVG-CFX, patent SVG-RCA  . Obesity   . PVD (peripheral vascular disease) ([place])     Left vertebral stent 1998  . OSA on CPAP   . Hyperlipidemia   . Chronic bronchitis ([place])   . Arthritis   . Gout   . Pneumonia 05/2011  . Bradycardia, to 30 12/20/2012  . Nephrolithiasis   . CKD (chronic kidney disease) stage 3, GFR 30-59 ml/min    Social History   Social History  . Marital Status: Married    Spouse Name: N/A  . Number of Children: N/A  . Years of Education: N/A    Occupational History  . retired    Social History Main Topics  . Smoking status: Former Smoker -- 3.50 packs/day for 40 years    Types: Cigarettes    Quit date: 12/09/1985  . Smokeless tobacco: Former Systems developer    Types: Chew     Comment: 12/17/2012 "quit chewing tobacco in 1998"  . Alcohol Use: Yes     Comment: 12/17/2012 "1-2 beers/wk" - socially   . Drug Use: No  . Sexual Activity: No   Other Topics Concern  . None   Social History Narrative   Family History  Problem Relation Age of Onset  . Arthritis    . Cancer    . Diabetes    . Colon cancer Neg Hx    Scheduled Meds: . atropine  2 drop Sublingual QID  . LORazepam  1 mg Oral BID  . morphine CONCENTRATE  5 mg Sublingual Q4H   Continuous Infusions:  PRN Meds:.acetaminophen, haloperidol lactate, morphine CONCENTRATE Medications Prior  to Admission:  Prior to Admission medications   Medication Sig Start Date End Date Taking? Authorizing Provider  allopurinol (ZYLOPRIM) 300 MG tablet Take 150 mg by mouth at bedtime.    Yes Historical Provider, MD  aspirin EC 81 MG tablet Take 81 mg by mouth every morning.    Yes Historical Provider, MD  atorvastatin (LIPITOR) 40 MG tablet TAKE 1 TABLET BY MOUTH DAILY. 02/04/15  Yes Lorretta Harp, MD  Cholecalciferol (VITAMIN D3) 1000 UNITS CAPS Take 1 capsule by mouth 2 (two) times daily.    Yes Historical Provider, MD  ciprofloxacin-fluocinolone PF (OTOVEL) 0.3-0.025 % SOLN Place 0.25 mLs in ear(s) 2 (two) times daily as needed (for drainage).   Yes Historical Provider, MD  clopidogrel (PLAVIX) 75 MG tablet TAKE ONE TABLET BY MOUTH ONCE DAILY. 11/19/14  Yes Lorretta Harp, MD  furosemide (LASIX) 40 MG tablet Take 1 tablet by mouth 2 (two) times daily. 03/13/14  Yes Historical Provider, MD  gabapentin (NEURONTIN) 300 MG capsule Take 300 mg by mouth 4 (four) times daily as needed (takes three times daily but may take one in between if needed).   Yes Historical Provider, MD  glipiZIDE  (GLUCOTROL) 10 MG tablet Take 10 mg by mouth daily.    Yes Historical Provider, MD  HYDROcodone-acetaminophen (NORCO) 10-325 MG per tablet Take 1 tablet by mouth 4 (four) times daily as needed for pain.   Yes Historical Provider, MD  insulin glargine (LANTUS) 100 UNIT/ML injection Inject 60 Units into the skin 2 (two) times daily.    Yes Historical Provider, MD  isosorbide dinitrate (ISORDIL) 20 MG tablet Take 1 tablet (20 mg total) by mouth 3 (three) times daily. 03/08/13  Yes Lorretta Harp, MD  levothyroxine (SYNTHROID, LEVOTHROID) 150 MCG tablet Take 150 mcg by mouth daily before breakfast.   Yes Historical Provider, MD  mometasone (ELOCON) 0.1 % ointment Apply 1 application topically as directed.  03/13/13  Yes Historical Provider, MD  nitroGLYCERIN (NITROSTAT) 0.4 MG SL tablet Place 1 tablet (0.4 mg total) under the tongue every 5 (five) minutes as needed for chest pain. 12/09/12  Yes [name], PA-C  omeprazole (PRILOSEC) 20 MG capsule Take 20 mg by mouth at bedtime.    Yes Historical Provider, MD  [name] (JANUVIA) 100 MG tablet Take 50 mg by mouth 2 (two) times daily.    Yes Historical Provider, MD  Tiotropium Bromide Monohydrate (SPIRIVA HANDIHALER IN) Inhale 1 capsule into the lungs every morning.    Yes Historical Provider, MD  triamcinolone cream (KENALOG) 0.1 % Apply 1 application topically daily as needed (for irritation).  09/25/12  Yes Historical Provider, MD   Allergies  Allergen Reactions  . Demerol Nausea And Vomiting  . Ranexa [Ranolazine] Nausea Only    dizziness    Review of Systems  Unable to perform ROS: Acuity of condition    Physical Exam  Nursing note and vitals reviewed. Constitutional:  Some work of breathing noted. No acute distress  HENT:  Head: Normocephalic and atraumatic.  Cardiovascular:  Occasional PVCs, heart rate one tens  Respiratory:  Raucous noted throughout. Work of breathing noted, respiratory rate mid 30s.  GI: Soft. He exhibits no  distension. There is no tenderness.  Neurological:  Does not open eyes to command  Skin: Skin is warm and dry.    Vital Signs: BP 135/84 mmHg  Pulse 107  Temp(Src) 99.1 F (37.3 C) (Axillary)  Resp 34  Ht 5\' 11"  (1.803 m)  Wt  110.7 kg (244 lb 0.8 oz)  BMI 34.05 kg/m2  SpO2 91%  SpO2: SpO2: 91 % O2 Device:SpO2: 91 % O2 Flow Rate: .   IO: Intake/output summary:  Intake/Output Summary (Last 24 hours) at 05/04/15 1451 Last data filed at 05/04/15 0500  Gross per 24 hour  Intake      0 ml  Output    525 ml  Net   -525 ml    LBM: Last BM Date: 04/30/15 Baseline Weight: Weight: 119.296 kg (263 lb) Most recent weight: Weight: 110.7 kg (244 lb 0.8 oz)      Palliative Assessment/Data:  Flowsheet Rows        Most Recent Value   Intake Tab    Referral Department  Pulmonary   Unit at Time of Referral  ICU   Palliative Care Primary Diagnosis  Neurology   Date Notified  05/04/15   Palliative Care Type  New Palliative care   Reason for referral  Clarify Goals of Care, End of Life Care Assistance, Psychosocial or Spiritual support, Non-pain Symptom   Date of Admission  04/18/2015   Date first seen by Palliative Care  05/04/15   # of days Palliative referral response time  0 Day(s)   # of days IP prior to Palliative referral  7   Clinical Assessment    Palliative Performance Scale Score  20%   Pain Max last 24 hours  Not able to report   Pain Min Last 24 hours  Not able to report   Dyspnea Max Last 24 Hours  Not able to report   Dyspnea Min Last 24 hours  Not able to report   Psychosocial & Spiritual Assessment    Social Work Plan of Care  Staff support   Palliative Care Outcomes    Patient/Family meeting held?  Yes   Who was at the meeting?  wife and daughter   Palliative Care Outcomes  Improved pain interventions, Improved non-pain symptom therapy, Provided end of life care assistance   Patient/Family wishes: Interventions discontinued/not started   Mechanical Ventilation,  BiPAP, PEG, Vasopressors, Tube feedings/TPN   Palliative Care follow-up planned  -- [PMT to follow at [facility]]      Additional Data Reviewed:  CBC:    Component Value Date/Time   WBC 23.5* 05/04/2015 0910   WBC 13.2* 04/26/2015 0928   HGB 14.4 05/04/2015 0910   HCT 43.7 05/04/2015 0910   HCT 42.9 04/26/2015 0928   PLT 253 05/04/2015 0910   PLT 325 04/26/2015 0928   MCV 98.0 05/04/2015 0910   MCV 93 04/26/2015 0928   NEUTROABS 19.8* 05/04/2015 0910   NEUTROABS 9.8* 04/26/2015 0928   LYMPHSABS 2.1 05/04/2015 0910   LYMPHSABS 2.0 04/26/2015 0928   MONOABS 1.5* 05/04/2015 0910   EOSABS 0.1 05/04/2015 0910   EOSABS 0.3 04/26/2015 0928   BASOSABS 0.0 05/04/2015 0910   BASOSABS 0.0 04/26/2015 0928   Comprehensive Metabolic Panel:    Component Value Date/Time   NA 147* 05/04/2015 0910   NA 140 04/26/2015 0928   K 5.0 05/04/2015 0910   CL 109 05/04/2015 0910   CO2 25 05/04/2015 0910   BUN 56* 05/04/2015 0910   BUN 26 04/26/2015 0928   CREATININE 3.64* 05/04/2015 0910   CREATININE 1.60* 12/09/2012 1517   GLUCOSE 200* 05/04/2015 0910   GLUCOSE 168* 04/26/2015 0928   CALCIUM 9.4 05/04/2015 0910   AST 36 05/02/2015 0501   ALT 25 05/02/2015 0501   ALKPHOS 118 05/02/2015 0501  BILITOT 1.7* 05/02/2015 0501   BILITOT 0.9 04/26/2015 0928   PROT 7.3 05/02/2015 0501   PROT 7.6 04/26/2015 0928   ALBUMIN 3.3* 05/02/2015 0501   ALBUMIN 4.0 04/26/2015 0928     Time In: 1130 Time Out: 1300 Time Total: 90 minutes Greater than 50%  of this time was spent counseling and coordinating care related to the above assessment and plan.  Signed by: Drue Novel, NP  Drue Novel, NP  05/04/2015, 2:51 PM  Please contact Palliative Medicine Team phone at [phone] for questions and concerns.

## 2015-05-04 NOTE — Progress Notes (Signed)
Subjective: He continues not to do well. DO NOT RESUSCITATE discussion was noted. I discussed his situation with his family today and they request comfort care.  Objective: Vital signs in last 24 hours: Temp:  [98.4 F (36.9 C)-100.1 F (37.8 C)] 99.1 F (37.3 C) (03/07 0721) Pulse Rate:  [99-109] 99 (03/07 0638) Resp:  [26-38] 30 (03/07 [id number]) BP: (123-172)/(59-90) 148/74 mmHg (03/07 0638) SpO2:  [89 %-96 %] 91 % (03/07 [id number]) Weight:  [110.7 kg (244 lb 0.8 oz)] 110.7 kg (244 lb 0.8 oz) (03/07 0500) Weight change: -1.3 kg (-2 lb 13.9 oz) Last BM Date: 04/30/15  Intake/Output from previous day: 03/06 0701 - 03/07 0700 In: -  Out: 525 [Urine:525]  PHYSICAL EXAM General appearance: Poorly responsive. He has some gurgling. Resp: rhonchi bilaterally Cardio: regular rate and rhythm, S1, S2 normal, no murmur, click, rub or gallop GI: soft, non-tender; bowel sounds normal; no masses,  no organomegaly Extremities: extremities normal, atraumatic, no cyanosis or edema  Lab Results:  Results for orders placed or performed during the hospital encounter of 04/10/2015 (from the past 48 hour(s))  Glucose, capillary     Status: Abnormal   Collection Time: 05/02/15 11:50 AM  Result Value Ref Range   Glucose-Capillary 169 (H) 65 - 99 mg/dL   Comment 1 Notify RN    Comment 2 Document in Chart   Glucose, capillary     Status: Abnormal   Collection Time: 05/02/15  3:49 PM  Result Value Ref Range   Glucose-Capillary 176 (H) 65 - 99 mg/dL  Glucose, capillary     Status: Abnormal   Collection Time: 05/02/15  7:33 PM  Result Value Ref Range   Glucose-Capillary 193 (H) 65 - 99 mg/dL   Comment 1 Notify RN    Comment 2 Document in Chart   Glucose, capillary     Status: Abnormal   Collection Time: 05/02/15 11:46 PM  Result Value Ref Range   Glucose-Capillary 160 (H) 65 - 99 mg/dL   Comment 1 Notify RN   Glucose, capillary     Status: Abnormal   Collection Time: 05/03/15  4:33 AM  Result Value  Ref Range   Glucose-Capillary 161 (H) 65 - 99 mg/dL   Comment 1 Notify RN   Glucose, capillary     Status: Abnormal   Collection Time: 05/03/15  8:08 AM  Result Value Ref Range   Glucose-Capillary 201 (H) 65 - 99 mg/dL   Comment 1 Notify RN    Comment 2 Document in Chart   Glucose, capillary     Status: Abnormal   Collection Time: 05/03/15 11:25 AM  Result Value Ref Range   Glucose-Capillary 189 (H) 65 - 99 mg/dL  Glucose, capillary     Status: Abnormal   Collection Time: 05/03/15  4:21 PM  Result Value Ref Range   Glucose-Capillary 184 (H) 65 - 99 mg/dL  Glucose, capillary     Status: Abnormal   Collection Time: 05/03/15  7:37 PM  Result Value Ref Range   Glucose-Capillary 184 (H) 65 - 99 mg/dL  Glucose, capillary     Status: Abnormal   Collection Time: 05/04/15 12:30 AM  Result Value Ref Range   Glucose-Capillary 196 (H) 65 - 99 mg/dL  Glucose, capillary     Status: Abnormal   Collection Time: 05/04/15  3:59 AM  Result Value Ref Range   Glucose-Capillary 196 (H) 65 - 99 mg/dL  Glucose, capillary     Status: Abnormal   Collection Time: 05/04/15  7:15 AM  Result Value Ref Range   Glucose-Capillary 193 (H) 65 - 99 mg/dL   Comment 1 Notify RN    Comment 2 Document in Chart     ABGS No results for input(s): PHART, PO2ART, TCO2, HCO3 in the last 72 hours.  Invalid input(s): PCO2 CULTURES Recent Results (from the past 240 hour(s))  Microscopic Examination     Status: Abnormal   Collection Time: 04/26/15  9:28 AM  Result Value Ref Range Status   WBC, UA >30 (A) 0 -  5 /hpf Final   RBC, UA 0-2 0 -  2 /hpf Final   Epithelial Cells (non renal) 0-10 0 - 10 /hpf Final   Casts Present (A) None seen /lpf Final   Cast Type Hyaline casts N/A Final   Mucus, UA Present Not Estab. Final   Bacteria, UA Few None seen/Few Final  MRSA PCR Screening     Status: None   Collection Time: 04/11/2015  9:26 PM  Result Value Ref Range Status   MRSA by PCR NEGATIVE NEGATIVE Final    Comment:         The GeneXpert MRSA Assay (FDA approved for NASAL specimens only), is one component of a comprehensive MRSA colonization surveillance program. It is not intended to diagnose MRSA infection nor to guide or monitor treatment for MRSA infections.   Culture, Urine     Status: None   Collection Time: 04/29/15 10:20 PM  Result Value Ref Range Status   Specimen Description URINE, CLEAN CATCH  Final   Special Requests NONE  Final   Culture   Final    30,000 COLONIES/mL DIPHTHEROIDS(CORYNEBACTERIUM SPECIES) Standardized susceptibility testing for this organism is not available. Performed at [facility]    Report Status 05/02/2015 FINAL  Final   Studies/Results: No results found.  Medications:  Prior to Admission:  Prescriptions prior to admission  Medication Sig Dispense Refill Last Dose  . allopurinol (ZYLOPRIM) 300 MG tablet Take 150 mg by mouth at bedtime.    04/26/2015 at Unknown time  . aspirin EC 81 MG tablet Take 81 mg by mouth every morning.    04/16/2015 at Unknown time  . atorvastatin (LIPITOR) 40 MG tablet TAKE 1 TABLET BY MOUTH DAILY. 30 tablet 0 04/15/2015 at Unknown time  . Cholecalciferol (VITAMIN D3) 1000 UNITS CAPS Take 1 capsule by mouth 2 (two) times daily.    04/05/2015 at Unknown time  . ciprofloxacin-fluocinolone PF (OTOVEL) 0.3-0.025 % SOLN Place 0.25 mLs in ear(s) 2 (two) times daily as needed (for drainage).   04/13/2015 at Unknown time  . clopidogrel (PLAVIX) 75 MG tablet TAKE ONE TABLET BY MOUTH ONCE DAILY. 90 tablet 0 04/12/2015 at Unknown time  . furosemide (LASIX) 40 MG tablet Take 1 tablet by mouth 2 (two) times daily.   04/23/2015 at    . gabapentin (NEURONTIN) 300 MG capsule Take 300 mg by mouth 4 (four) times daily as needed (takes three times daily but may take one in between if needed).   04/07/2015 at Unknown time  . glipiZIDE (GLUCOTROL) 10 MG tablet Take 10 mg by mouth daily.    04/25/2015 at Unknown time  . HYDROcodone-acetaminophen (NORCO)  10-325 MG per tablet Take 1 tablet by mouth 4 (four) times daily as needed for pain.   04/14/2015 at Unknown time  . insulin glargine (LANTUS) 100 UNIT/ML injection Inject 60 Units into the skin 2 (two) times daily.    04/18/2015 at Unknown time  . isosorbide dinitrate (  ISORDIL) 20 MG tablet Take 1 tablet (20 mg total) by mouth 3 (three) times daily. 90 tablet 10 04/04/2015 at Unknown time  . levothyroxine (SYNTHROID, LEVOTHROID) 150 MCG tablet Take 150 mcg by mouth daily before breakfast.   03/31/2015 at Unknown time  . mometasone (ELOCON) 0.1 % ointment Apply 1 application topically as directed.    unknown  . nitroGLYCERIN (NITROSTAT) 0.4 MG SL tablet Place 1 tablet (0.4 mg total) under the tongue every 5 (five) minutes as needed for chest pain. 25 tablet 2 unknown  . omeprazole (PRILOSEC) 20 MG capsule Take 20 mg by mouth at bedtime.    04/26/2015 at Unknown time  . [name] (JANUVIA) 100 MG tablet Take 50 mg by mouth 2 (two) times daily.    04/12/2015 at Unknown time  . Tiotropium Bromide Monohydrate (SPIRIVA HANDIHALER IN) Inhale 1 capsule into the lungs every morning.    03/31/2015 at Unknown time  . triamcinolone cream (KENALOG) 0.1 % Apply 1 application topically daily as needed (for irritation).    unknown   Scheduled:  Continuous:  [id number], haloperidol lactate, LORazepam, morphine injection  Assesment: He came in with a TIA. He is having significant trouble with swallowing and I think he is aspirating. He seems to have some neglect of his left side. I think he may have gone ahead and had a full-blown stroke. His family wants to go with comfort care which I think is appropriate. He has told him on multiple occasions and he does not want aggressive care. Principal Problem:   TIA (transient ischemic attack) Active Problems:   Chronic diastolic heart failure ([facility])   DM (diabetes mellitus) ([place])   Hypothyroidism   HTN (hypertension)   CAD, CABG '98, cath '08, low risk Myoview Feb  2012, stable cath 12/19/12 with chronically occluded VG to LCX, patent LIMA-LAD & VG to dominant rt.    Obesity   Depression   Sleep apnea, on C-pap   PVD, Lt Vertebral artery PTA in '08, known moderate ICA disease   CKD (chronic kidney disease) stage 3, GFR 30-59 ml/min   Hyperlipidemia   Anxiety   Toxic encephalopathy    Plan: Palliative care consultation. Continue other treatments.    LOS: 4 days   Anevay Campanella L 05/04/2015, 8:31 AM

## 2015-05-04 NOTE — Progress Notes (Signed)
Patient was transferred successfully to A305-1. Report given to [place], Therapist, sports. Acknowledged patient's orders.

## 2015-05-04 NOTE — Progress Notes (Signed)
Family does not want patient on CPAP

## 2015-05-04 NOTE — Progress Notes (Signed)
APPT MADE AND LETTER SENT  °

## 2015-05-05 ENCOUNTER — Ambulatory Visit: Payer: Medicare Other | Admitting: Gastroenterology

## 2015-05-11 ENCOUNTER — Telehealth: Payer: Self-pay | Admitting: Internal Medicine

## 2015-05-11 NOTE — Telephone Encounter (Signed)
Pt is deceased. 

## 2015-05-17 NOTE — Telephone Encounter (Signed)
Noted  

## 2015-05-19 ENCOUNTER — Telehealth: Payer: Self-pay | Admitting: *Deleted

## 2015-05-21 NOTE — Telephone Encounter (Signed)
"  Buck died on March 8.  Cancel his appointment."

## 2015-05-29 NOTE — Progress Notes (Signed)
Nursing staff called to room by pt's wife, pt's breathing had changed. Pt having agonal breathing. Pt no longer with heart beat or respirations at 0452. Verified by 2 RN's . MD on call paged and [facility] called. Pt's body placed on hold for possible tissue donation.

## 2015-05-29 NOTE — Progress Notes (Signed)
Pt transported to morgue.  Still awaiting notice from [facility], pt on hold til cleared through them.

## 2015-05-29 NOTE — Discharge Summary (Signed)
Physician Discharge Summary  Patient ID: [name] MRN: [id number] DOB/AGE: 11-16-1933 82 y.o. Primary Care Physician:Beulah Matusek L, MD Admit date: 04/06/2015 Discharge date: 2015/05/10    Discharge Diagnoses:   Principal Problem:   TIA (transient ischemic attack) Active Problems:   Chronic diastolic heart failure (HCC)   DM (diabetes mellitus) ([place])   Hypothyroidism   HTN (hypertension)   CAD, CABG '98, cath '08, low risk Myoview Feb 2012, stable cath 12/19/12 with chronically occluded VG to LCX, patent LIMA-LAD & VG to dominant rt.    Obesity   Depression   Sleep apnea, on C-pap   PVD, Lt Vertebral artery PTA in '08, known moderate ICA disease   CKD (chronic kidney disease) stage 3, GFR 30-59 ml/min   Hyperlipidemia   Anxiety   Toxic encephalopathy   Cerebral infarction due to unspecified mechanism   Palliative care encounter   Advance care planning     Medication List    ASK your doctor about these medications        allopurinol 300 MG tablet  Commonly known as:  ZYLOPRIM  Take 150 mg by mouth at bedtime.     aspirin EC 81 MG tablet  Take 81 mg by mouth every morning.     atorvastatin 40 MG tablet  Commonly known as:  LIPITOR  TAKE 1 TABLET BY MOUTH DAILY.     clopidogrel 75 MG tablet  Commonly known as:  PLAVIX  TAKE ONE TABLET BY MOUTH ONCE DAILY.     furosemide 40 MG tablet  Commonly known as:  LASIX  Take 1 tablet by mouth 2 (two) times daily.     gabapentin 300 MG capsule  Commonly known as:  NEURONTIN  Take 300 mg by mouth 4 (four) times daily as needed (takes three times daily but may take one in between if needed).     glipiZIDE 10 MG tablet  Commonly known as:  GLUCOTROL  Take 10 mg by mouth daily.     HYDROcodone-acetaminophen 10-325 MG tablet  Commonly known as:  NORCO  Take 1 tablet by mouth 4 (four) times daily as needed for pain.     insulin glargine 100 UNIT/ML injection  Commonly known as:  LANTUS  Inject 60 Units into the  skin 2 (two) times daily.     isosorbide dinitrate 20 MG tablet  Commonly known as:  ISORDIL  Take 1 tablet (20 mg total) by mouth 3 (three) times daily.     levothyroxine 150 MCG tablet  Commonly known as:  SYNTHROID, LEVOTHROID  Take 150 mcg by mouth daily before breakfast.     mometasone 0.1 % ointment  Commonly known as:  ELOCON  Apply 1 application topically as directed.     nitroGLYCERIN 0.4 MG SL tablet  Commonly known as:  NITROSTAT  Place 1 tablet (0.4 mg total) under the tongue every 5 (five) minutes as needed for chest pain.     omeprazole 20 MG capsule  Commonly known as:  PRILOSEC  Take 20 mg by mouth at bedtime.     OTOVEL 0.3-0.025 % Soln  Generic drug:  ciprofloxacin-fluocinolone PF  Place 0.25 mLs in ear(s) 2 (two) times daily as needed (for drainage).     [name] 100 MG tablet  Commonly known as:  JANUVIA  Take 50 mg by mouth 2 (two) times daily.     SPIRIVA HANDIHALER IN  Inhale 1 capsule into the lungs every morning.     triamcinolone cream 0.1 %  Commonly known as:  KENALOG  Apply 1 application topically daily as needed (for irritation).     Vitamin D3 1000 units Caps  Take 1 capsule by mouth 2 (two) times daily.        Discharged Condition: Deceased    Consults: Neurology cardiology  Significant Diagnostic Studies: Ct Abdomen Pelvis Wo Contrast  04/08/2015  CLINICAL DATA:  Generalized abdominal pain and diarrhea. EXAM: CT ABDOMEN AND PELVIS WITHOUT CONTRAST TECHNIQUE: Multidetector CT imaging of the abdomen and pelvis was performed following the standard protocol without IV contrast. COMPARISON:  03/24/2008 FINDINGS: Lung bases demonstrate scarring bilaterally. A few tiny nodular densities are noted in the left lower lobe stable from the previous exam consistent with a benign etiology. Changes of prior coronary bypass grafting and coronary stenting are noted. Heavy coronary calcifications are seen. The gallbladder has been surgically  removed. The liver, spleen, adrenal glands and pancreas are within normal limits. The kidneys are well visualized bilaterally. Nonobstructing right renal stones are seen. These measure approximately 2-3 mm in greatest dimension. No obstructive changes are seen. Aortoiliac calcifications are noted without aneurysmal dilatation. The appendix is well visualized and normal in appearance. The cecum is somewhat mobile. No obstructive changes are seen. Mild diverticular change is noted. Prostatic calcifications are noted. The bladder is well distended without focal abnormality. The osseous structures show degenerative change of the thoracolumbar spine. IMPRESSION: Stable benign nodular densities in the left lung base. Postsurgical changes. Nonobstructing right renal stones Electronically Signed   By: Inez Catalina M.D.   On: 04/08/2015 08:07   Ct Head Wo Contrast  04/06/2015  CLINICAL DATA:  Code stroke.  Confusion.  Right-sided droop. EXAM: CT HEAD WITHOUT CONTRAST TECHNIQUE: Contiguous axial images were obtained from the base of the skull through the vertex without intravenous contrast. COMPARISON:  06/16/2013 FINDINGS: There is mild diffuse low-attenuation within the subcortical and periventricular white matter compatible with chronic microvascular disease. Encephalomalacia within the left frontal lobe is again noted compatible with previous infarct. Right posterior parietal encephalomalacia is also again noted. Progressive encephalomalacia involving the right frontal lobe is noted. Chronic infarcts involving bilateral cerebellar hemispheres are identified, image 12 of series 2. No evidence for acute intracranial hemorrhage, mass or acute cortical infarct. The paranasal sinuses are clear. The mastoid air cells are clear. The calvarium is intact. IMPRESSION: 1. No acute intracranial abnormalities. 2. Advanced small vessel ischemic disease 3. Chronic bilateral cerebral and cerebellar infarcts. Electronically Signed    By: Kerby Moors M.D.   On: 04/05/2015 17:55   Mr Jodene Nam Head Wo Contrast  04/28/2015  CLINICAL DATA:  Slurred speech and right-sided weakness. Right facial droop. Symptoms began yesterday. EXAM: MRI HEAD WITHOUT CONTRAST MRA HEAD WITHOUT CONTRAST TECHNIQUE: Multiplanar, multiecho pulse sequences of the brain and surrounding structures were obtained without intravenous contrast. Angiographic images of the head were obtained using MRA technique without contrast. COMPARISON:  CT 04/11/2015.  MRI 06/16/2013. FINDINGS: MRI HEAD FINDINGS Diffusion imaging does not show any acute or subacute infarction. There chronic small-vessel ischemic changes throughout the pons. There are old bilateral small vessel cerebellar infarctions cerebral hemispheres show old cortical and subcortical infarctions in the right temporoparietal region, the right parietal region and both frontal regions. There is confluent chronic small vessel ischemic change affecting the white matter there is old left thalamic lacunar infarction. No evidence of neoplastic mass lesion, acute hemorrhage, hydrocephalus or extra-axial collection. There are a few foci of hemosiderin deposition related to old infarctions. No pituitary mass. No  inflammatory sinus disease. No skull or skullbase lesion. MRA HEAD FINDINGS Both internal carotid arteries are widely patent through the skullbase. No siphon stenosis. The anterior and middle cerebral vessels are patent without proximal stenosis, aneurysm or vascular malformation. There are areas of narrowing in the more distal MCA branch vessels, more notable on the right than the left. The left vertebral artery is a large vessel widely patent to the basilar. No antegrade flow in the right vertebral artery at the foramen magnum. Mild retrograde flow in the distal right vertebral artery. No basilar stenosis. Superior cerebellar and posterior cerebral arteries show flow bilaterally, with distal narrowing and irregularity in the  PCA branches. IMPRESSION: No acute infarction. Chronic small-vessel ischemic change throughout the brain. Old cortical infarctions affecting both hemispheres. No antegrade flow in the right vertebral artery. Medium to small vessel intracranial atherosclerosis diffusely, most evident in the MCA and PCA branches. Electronically Signed   By: Nelson Chimes M.D.   On: 04/28/2015 08:30   Mr Brain Wo Contrast  04/28/2015  CLINICAL DATA:  Slurred speech and right-sided weakness. Right facial droop. Symptoms began yesterday. EXAM: MRI HEAD WITHOUT CONTRAST MRA HEAD WITHOUT CONTRAST TECHNIQUE: Multiplanar, multiecho pulse sequences of the brain and surrounding structures were obtained without intravenous contrast. Angiographic images of the head were obtained using MRA technique without contrast. COMPARISON:  CT 04/26/2015.  MRI 06/16/2013. FINDINGS: MRI HEAD FINDINGS Diffusion imaging does not show any acute or subacute infarction. There chronic small-vessel ischemic changes throughout the pons. There are old bilateral small vessel cerebellar infarctions cerebral hemispheres show old cortical and subcortical infarctions in the right temporoparietal region, the right parietal region and both frontal regions. There is confluent chronic small vessel ischemic change affecting the white matter there is old left thalamic lacunar infarction. No evidence of neoplastic mass lesion, acute hemorrhage, hydrocephalus or extra-axial collection. There are a few foci of hemosiderin deposition related to old infarctions. No pituitary mass. No inflammatory sinus disease. No skull or skullbase lesion. MRA HEAD FINDINGS Both internal carotid arteries are widely patent through the skullbase. No siphon stenosis. The anterior and middle cerebral vessels are patent without proximal stenosis, aneurysm or vascular malformation. There are areas of narrowing in the more distal MCA branch vessels, more notable on the right than the left. The left  vertebral artery is a large vessel widely patent to the basilar. No antegrade flow in the right vertebral artery at the foramen magnum. Mild retrograde flow in the distal right vertebral artery. No basilar stenosis. Superior cerebellar and posterior cerebral arteries show flow bilaterally, with distal narrowing and irregularity in the PCA branches. IMPRESSION: No acute infarction. Chronic small-vessel ischemic change throughout the brain. Old cortical infarctions affecting both hemispheres. No antegrade flow in the right vertebral artery. Medium to small vessel intracranial atherosclerosis diffusely, most evident in the MCA and PCA branches. Electronically Signed   By: Nelson Chimes M.D.   On: 04/28/2015 08:30   US Carotid Bilateral  04/28/2015  CLINICAL DATA:  TIA. EXAM: BILATERAL CAROTID DUPLEX ULTRASOUND TECHNIQUE: Pearline Cables scale imaging, color Doppler and duplex ultrasound were performed of bilateral carotid and vertebral arteries in the neck. COMPARISON:  MRI 04/28/2015. FINDINGS: Criteria: Quantification of carotid stenosis is based on velocity parameters that correlate the residual internal carotid diameter with NASCET-based stenosis levels, using the diameter of the distal internal carotid lumen as the denominator for stenosis measurement. The following velocity measurements were obtained: RIGHT ICA:  195/22 cm/sec CCA:  [id number] cm/sec SYSTOLIC ICA/CCA RATIO:  1.3 DIASTOLIC ICA/CCA RATIO:  3.0 ECA:  248 cm/sec LEFT ICA:  122/22 cm/sec CCA:  [id number] cm/sec SYSTOLIC ICA/CCA RATIO:  1.8 DIASTOLIC ICA/CCA RATIO:  2.8 ECA:  67 cm/sec RIGHT CAROTID ARTERY: Right carotid bifurcation moderate calcified plaque. Degree of stenosis less than 50%. RIGHT VERTEBRAL ARTERY:  Not visualized. LEFT CAROTID ARTERY: Right carotid bifurcation moderate calcified plaque. Degree of stenosis less than 50%. LEFT VERTEBRAL ARTERY:  Patent with antegrade flow. Exam limited by patient movement. IMPRESSION: 1. Moderate bilateral carotid  bifurcation calcified plaque. Degree of stenosis less than 50% bilaterally. 2. Right vertebral not visualized. Left vertebral patent with antegrade flow. Electronically Signed   By: Marcello Moores  Register   On: 04/28/2015 16:42    Lab Results: Basic Metabolic Panel:  Recent Labs  05/04/15 0910  NA 147*  K 5.0  CL 109  CO2 25  GLUCOSE 200*  BUN 56*  CREATININE 3.64*  CALCIUM 9.4   Liver Function Tests: No results for input(s): AST, ALT, ALKPHOS, BILITOT, PROT, ALBUMIN in the last 72 hours.   CBC:  Recent Labs  05/04/15 0910  WBC 23.5*  NEUTROABS 19.8*  HGB 14.4  HCT 43.7  MCV 98.0  PLT 253    Recent Results (from the past 240 hour(s))  Microscopic Examination     Status: Abnormal   Collection Time: 04/26/15  9:28 AM  Result Value Ref Range Status   WBC, UA >30 (A) 0 -  5 /hpf Final   RBC, UA 0-2 0 -  2 /hpf Final   Epithelial Cells (non renal) 0-10 0 - 10 /hpf Final   Casts Present (A) None seen /lpf Final   Cast Type Hyaline casts N/A Final   Mucus, UA Present Not Estab. Final   Bacteria, UA Few None seen/Few Final  MRSA PCR Screening     Status: None   Collection Time: 04/25/2015  9:26 PM  Result Value Ref Range Status   MRSA by PCR NEGATIVE NEGATIVE Final    Comment:        The GeneXpert MRSA Assay (FDA approved for NASAL specimens only), is one component of a comprehensive MRSA colonization surveillance program. It is not intended to diagnose MRSA infection nor to guide or monitor treatment for MRSA infections.   Culture, Urine     Status: None   Collection Time: 04/29/15 10:20 PM  Result Value Ref Range Status   Specimen Description URINE, CLEAN CATCH  Final   Special Requests NONE  Final   Culture   Final    30,000 COLONIES/mL DIPHTHEROIDS(CORYNEBACTERIUM SPECIES) Standardized susceptibility testing for this organism is not available. Performed at [facility]    Report Status 05/02/2015 FINAL  Final     Hospital Course: This is a  82 year old who came to the hospital with presumed TIA. He was confused. He came back to basically his normal mental status but then developed severe confusion. He was difficult to manage. He did not resume normal mental status after that and there is a thought that he might have had some sort of a stroke. He did have abnormal urinalysis but grew only 30,000 colonies and this was being treated with IV antibiotics. Initial studies did not show an acute stroke but he may have had another one when he became so agitated and confused. He continued with little or no improvement. Palliative care consultation was undertaken and I had a long discussion with his family. They said that he would not want to continue in his  current condition and he was switched to comfort care. He died comfortably at 89 on 05-29-15  Discharge Exam: Blood pressure 130/51, pulse 86, temperature 100.5 F (38.1 C), temperature source Axillary, resp. rate 28, height 5\' 11"  (1.803 m), weight 110.7 kg (244 lb 0.8 oz), SpO2 79 %. Not applicable  Disposition: To funeral home      Signed: Daschel Roughton L   2015-05-29, 9:00 AM

## 2015-05-29 NOTE — Discharge Summary (Signed)
[name], [name]               ACCOUNT NO.:  [id number]  MEDICAL RECORD NO.:  [id number]  LOCATION:                                 FACILITY:  PHYSICIAN:  Tunis Gentle L. Luan Pulling, M.D.DATE OF BIRTH:  1933/07/30  DATE OF ADMISSION:  04/18/2015 DATE OF DISCHARGE:  LH                              DISCHARGE SUMMARY   ADDENDUM:  DISCHARGE DIAGNOSES: 1. Cerebrovascular accident. 2. Urinary tract infection. 3. Encephalopathy due to transient ischemic attack and urinary tract     infection.  HOSPITAL COURSE:  It is felt that on presentation, he had TIA and that evolved into a stroke.     Aliciana Ricciardi L. Luan Pulling, M.D.     ELH/MEDQ  D:  05/20/2015  T:  05/20/2015  Job:  [id number]

## 2015-05-29 NOTE — Care Management Note (Signed)
Case Management Note  Patient Details  Name: [name] MRN: [id number] Date of Birth: [birth year]   Expected Discharge Date:  05/01/15               Expected Discharge Plan:  Expired  In-House Referral:  Hospice / Palliative Care  Discharge planning Services  CM Consult  Post Acute Care Choice:    Choice offered to:     DME Arranged:    DME Agency:     HH Arranged:    [place] Agency:     Status of Service:  Completed, signed off  Medicare Important Message Given:  Yes Date Medicare IM Given:    Medicare IM give by:    Date Additional Medicare IM Given:    Additional Medicare Important Message give by:     If discussed at [place] of Stay Meetings, dates discussed:    Additional Comments: Pt expired Sherald Barge, RN 06/03/2015, 10:34 AM

## 2015-05-29 DEATH — deceased

## 2015-06-03 ENCOUNTER — Ambulatory Visit: Payer: Medicare Other | Admitting: Podiatry

## 2015-06-15 ENCOUNTER — Ambulatory Visit: Payer: Medicare Other | Admitting: Gastroenterology

## 2015-08-06 ENCOUNTER — Ambulatory Visit: Payer: Medicare Other | Admitting: Cardiovascular Disease
